# Patient Record
Sex: Male | Born: 1980 | Race: Black or African American | Hispanic: No | Marital: Married | Smoking: Never smoker
Health system: Southern US, Community
[De-identification: ages and names within clinical notes are randomized; demographics above are authoritative.]

## PROBLEM LIST (undated history)

## (undated) DIAGNOSIS — K56609 Unspecified intestinal obstruction, unspecified as to partial versus complete obstruction: Secondary | ICD-10-CM

## (undated) DIAGNOSIS — C099 Malignant neoplasm of tonsil, unspecified: Secondary | ICD-10-CM

## (undated) DIAGNOSIS — D1391 Familial adenomatous polyposis: Secondary | ICD-10-CM

## (undated) DIAGNOSIS — F32A Depression, unspecified: Secondary | ICD-10-CM

## (undated) DIAGNOSIS — F419 Anxiety disorder, unspecified: Secondary | ICD-10-CM

## (undated) DIAGNOSIS — F4310 Post-traumatic stress disorder, unspecified: Secondary | ICD-10-CM

## (undated) DIAGNOSIS — I82409 Acute embolism and thrombosis of unspecified deep veins of unspecified lower extremity: Secondary | ICD-10-CM

## (undated) DIAGNOSIS — J45909 Unspecified asthma, uncomplicated: Secondary | ICD-10-CM

## (undated) HISTORY — DX: Depression, unspecified: F32.A

## (undated) HISTORY — DX: Unspecified intestinal obstruction, unspecified as to partial versus complete obstruction: K56.609

## (undated) HISTORY — DX: Post-traumatic stress disorder, unspecified: F43.10

## (undated) HISTORY — PX: APPENDECTOMY: SHX54

## (undated) HISTORY — DX: Anxiety disorder, unspecified: F41.9

## (undated) HISTORY — DX: Malignant neoplasm of tonsil, unspecified: C09.9

## (undated) HISTORY — DX: Familial adenomatous polyposis: D13.91

---

## 2006-11-29 HISTORY — PX: JOINT REPLACEMENT: SHX530

## 2014-11-29 HISTORY — PX: OTHER SURGICAL HISTORY: SHX169

## 2015-02-20 ENCOUNTER — Encounter (HOSPITAL_COMMUNITY): Payer: Self-pay | Admitting: Neurology

## 2015-02-20 ENCOUNTER — Emergency Department (HOSPITAL_COMMUNITY)
Admission: EM | Admit: 2015-02-20 | Discharge: 2015-02-20 | Disposition: A | Discharge status: Home / Self Care | Payer: BLUE CROSS/BLUE SHIELD | Attending: Emergency Medicine | Admitting: Emergency Medicine

## 2015-02-20 ENCOUNTER — Emergency Department (HOSPITAL_COMMUNITY): Payer: BLUE CROSS/BLUE SHIELD

## 2015-02-20 DIAGNOSIS — R51 Headache: Secondary | ICD-10-CM | POA: Insufficient documentation

## 2015-02-20 DIAGNOSIS — J4521 Mild intermittent asthma with (acute) exacerbation: Secondary | ICD-10-CM | POA: Diagnosis not present

## 2015-02-20 DIAGNOSIS — R1111 Vomiting without nausea: Secondary | ICD-10-CM | POA: Insufficient documentation

## 2015-02-20 DIAGNOSIS — R42 Dizziness and giddiness: Secondary | ICD-10-CM | POA: Insufficient documentation

## 2015-02-20 DIAGNOSIS — R0602 Shortness of breath: Secondary | ICD-10-CM

## 2015-02-20 DIAGNOSIS — J45909 Unspecified asthma, uncomplicated: Secondary | ICD-10-CM | POA: Diagnosis present

## 2015-02-20 HISTORY — DX: Unspecified asthma, uncomplicated: J45.909

## 2015-02-20 MED ORDER — ALBUTEROL SULFATE (2.5 MG/3ML) 0.083% IN NEBU
5.0000 mg | INHALATION_SOLUTION | Freq: Once | RESPIRATORY_TRACT | Status: AC
  Administered 2015-02-20: 5 mg via RESPIRATORY_TRACT
  Filled 2015-02-20: qty 6

## 2015-02-20 MED ORDER — PREDNISONE 20 MG PO TABS
60.0000 mg | ORAL_TABLET | Freq: Once | ORAL | Status: AC
  Administered 2015-02-20: 60 mg via ORAL
  Filled 2015-02-20: qty 3

## 2015-02-20 MED ORDER — IPRATROPIUM BROMIDE 0.02 % IN SOLN
0.5000 mg | Freq: Once | RESPIRATORY_TRACT | Status: AC
  Administered 2015-02-20: 0.5 mg via RESPIRATORY_TRACT
  Filled 2015-02-20: qty 2.5

## 2015-02-20 MED ORDER — BENZONATATE 100 MG PO CAPS: 100.0000 mg | ORAL_CAPSULE | Freq: Three times a day (TID) | ORAL | Status: DC | PRN

## 2015-02-20 MED ORDER — ALBUTEROL SULFATE HFA 108 (90 BASE) MCG/ACT IN AERS: 1.0000 | INHALATION_SPRAY | Freq: Four times a day (QID) | RESPIRATORY_TRACT | Status: DC | PRN

## 2015-02-20 MED ORDER — PREDNISONE 20 MG PO TABS: ORAL_TABLET | ORAL | Status: DC

## 2015-02-20 NOTE — ED Notes (Signed)
Pt ambulated in hallway with no complaints. O2 level went to 97%.

## 2015-02-20 NOTE — ED Notes (Signed)
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 Reports central cp. Expiratory wheezing noted. Pt is a x 4. In NAD

## 2015-02-20 NOTE — ED Provider Notes (Signed)
CSN: [id number]     Arrival date & time 02/20/15  1842 History  This chart was scribed for non-physician practitioner, Domenic Moras working with Lacretia Leigh, MD by Judithann Sauger, ED Scribe. The patient was seen in room TR09C/TR09C and the patient's care was started at 7:16 PM      Chief Complaint  Patient presents with  . Asthma   The history is provided by the patient. No language interpreter was used.   HPI Comments: Harry Williams is a 34 y.o. male with a hx of asthma who presents to the Emergency Department complaining of asthma symptoms onset today. He reports that he was diagnosed with asthma and given an inhaler about a month ago. During that time he presented with fatigue and wheezing. He denies any childhood asthma but adds that he had bronchitis when he was younger. 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 While at work, he had associated coughing, post-tussive vomiting, CP, rhinorrhea, eye soreness, HA, and lightheadedness. He explains during his episode, he was unable to talk or breathe. He reports that he took his inhaler which allowed him to be able to breathe and talk. He denies fever, abdominal pain, or N/V/D . Past Medical History  Diagnosis Date  . Asthma    History reviewed. No pertinent past surgical history. No family history on file. History  Substance Use Topics  . Smoking status: Never Smoker   . Smokeless tobacco: Not on file  . Alcohol Use: Yes    Review of Systems  Constitutional: Negative for chills and fatigue.  HENT: Positive for rhinorrhea.   Eyes: Positive for pain.  Respiratory: Positive for cough and shortness of breath.   Cardiovascular: Positive for chest pain.  Gastrointestinal: Positive for vomiting. Negative for nausea, abdominal pain and diarrhea.  Neurological: Positive for dizziness and headaches.      Allergies  Review of patient's allergies indicates no known allergies.  Home Medications   Prior to Admission medications    Not on File   BP 119/58 mmHg  Pulse 88  Temp(Src) 97.1 F (36.2 C) (Oral)  Resp 15  SpO2 94% Physical Exam  Constitutional: He is oriented to person, place, and time. He appears well-developed and well-nourished. No distress.  African American male in mild respiratory discomfort and currently receiving a nebulizer treatment during interview.  HENT:  Head: Normocephalic and atraumatic.  Right Ear: Tympanic membrane normal.  Left Ear: Tympanic membrane normal.  Mouth/Throat: No oropharyngeal exudate or posterior oropharyngeal edema.  Nose with mild rhinorrhea.  No uvula midline.   Eyes: Conjunctivae and EOM are normal.  Neck: Neck supple. No JVD present. No tracheal deviation present.  Cardiovascular: Normal rate.   Pulmonary/Chest: Effort normal. No respiratory distress.  Expiratory wheezes in bronchi throughout.  Abdominal: There is no tenderness.  Musculoskeletal: Normal range of motion.  Neurological: He is alert and oriented to person, place, and time.  Able to speak in full sentences.   Skin: Skin is warm and dry.  Psychiatric: He has a normal mood and affect. His behavior is normal.  Nursing note and vitals reviewed.   ED Course  Procedures (including critical care time) DIAGNOSTIC STUDIES: Oxygen Saturation is 94% on RA/Shannon, adequate by my interpretation.    COORDINATION OF CARE: 7:22 PM- Pt currently receiving a nebulizer treatment. Pt advised of plan for treatment and pt agrees.   7:59 PM Improvement of lung aeration after receiving breathing treatment, pt felt better.  Able to ambulate while  maintaining 97% on RA.  CXR neg for acute infection.  Will d/c with cough medication, albuterol inhaler, and steroid dose pak.   Labs Review Labs Reviewed - No data to display  Imaging Review Dg Chest 2 View  02/20/2015   CLINICAL DATA:  Increased cough and difficulty breathing  EXAM: CHEST  2 VIEW  COMPARISON:  None.  FINDINGS: The heart size and mediastinal contours  are within normal limits. Both lungs are clear. The visualized skeletal structures are unremarkable.  IMPRESSION: No active cardiopulmonary disease.   Electronically Signed   By: Inez Catalina M.D.   On: 02/20/2015 20:36     EKG Interpretation None      MDM   Final diagnoses:  SOB (shortness of breath)  Asthma exacerbation attacks, mild intermittent   BP 119/58 mmHg  Pulse 88  Temp(Src) 97.1 F (36.2 C) (Oral)  Resp 15  SpO2 94%  I have reviewed nursing notes and vital signs. I personally reviewed the imaging tests through PACS system  I reviewed available ER/hospitalization records thought the EMR  I personally performed the services described in this documentation, which was scribed in my presence. The recorded information has been reviewed and is accurate.    Domenic Moras, PA-C 02/20/15 2041  Lacretia Leigh, MD 02/23/15 [phone]

## 2015-02-20 NOTE — Discharge Instructions (Signed)

## 2015-03-30 HISTORY — PX: COLON SURGERY: SHX602

## 2015-04-04 DIAGNOSIS — D369 Benign neoplasm, unspecified site: Secondary | ICD-10-CM | POA: Insufficient documentation

## 2015-06-04 ENCOUNTER — Ambulatory Visit (INDEPENDENT_AMBULATORY_CARE_PROVIDER_SITE_OTHER): Payer: BLUE CROSS/BLUE SHIELD | Admitting: Physician Assistant

## 2015-06-04 VITALS — BP 112/68 | HR 68 | Temp 98.2°F | Resp 16 | Ht 68.0 in | Wt 160.4 lb

## 2015-06-04 DIAGNOSIS — K047 Periapical abscess without sinus: Secondary | ICD-10-CM

## 2015-06-04 MED ORDER — CHLORHEXIDINE GLUCONATE 0.12 % MT SOLN: 15.0000 mL | Freq: Two times a day (BID) | OROMUCOSAL | Status: DC

## 2015-06-04 MED ORDER — AMOXICILLIN-POT CLAVULANATE 875-125 MG PO TABS: 1.0000 | ORAL_TABLET | Freq: Two times a day (BID) | ORAL | Status: AC

## 2015-06-04 NOTE — Progress Notes (Signed)
[facility] [address] [place] [address] [phone]  Date:  06/04/2015   Name:  Harry Williams   DOB:  09-Mar-1981   MRN:  [id number]  PCP:  No PCP Per Patient    Chief Complaint: Dental Pain   History of Present Illness:  This is a 34 y.o. male who is presenting with dental pain x 2 days. Pain located to lower left jaw. Having pain with chewing. He is sensitive to hot and cold fluids. He looked in his mouth and noticed the gum over the most posterior molar on the lower left side looked swollen. Hasn't tried anything for the pain. Last saw a dentist in 2002. Does not have dental insurance. He denies fever, chills, malaise, headache, dysphagia.  Pt had total colectomy 03/2015 for polyposis.  Review of Systems:  Review of Systems See HPI  Patient Active Problem List   Diagnosis Date Noted  . Adenomatous polyp 04/04/2015    Prior to Admission medications   Medication Sig Start Date End Date Taking? Authorizing Provider  albuterol (PROVENTIL HFA;VENTOLIN HFA) 108 (90 BASE) MCG/ACT inhaler Inhale 1-2 puffs into the lungs every 6 (six) hours as needed for wheezing or shortness of breath. 02/20/15  Yes Fayrene Helper, PA-C  dicyclomine (BENTYL) 10 MG capsule Take 10 mg by mouth 4 (four) times daily -  before meals and at bedtime.   Yes Historical Provider, MD    No Known Allergies  Past Surgical History  Procedure Laterality Date  . Appendectomy    . Colon surgery      History  Substance Use Topics  . Smoking status: Never Smoker   . Smokeless tobacco: Not on file  . Alcohol Use: Yes    Family History  Problem Relation Age of Onset  . Diabetes Father   . Hypertension Brother   . Heart disease Paternal Grandmother   . Cancer Paternal Grandfather     Medication list has been reviewed and updated.  Physical Examination:  Physical Exam  Constitutional: He is oriented to person, place, and time. He appears well-developed and well-nourished. No  distress.  HENT:  Head: Normocephalic and atraumatic.  Right Ear: Hearing normal.  Left Ear: Hearing normal.  Nose: Nose normal.  Mouth/Throat: Uvula is midline, oropharynx is clear and moist and mucous membranes are normal.    No facial swelling  Eyes: Conjunctivae and lids are normal. Right eye exhibits no discharge. Left eye exhibits no discharge. No scleral icterus.  Cardiovascular: Normal rate, regular rhythm, normal heart sounds and normal pulses.   No murmur heard. Pulmonary/Chest: Effort normal and breath sounds normal. No respiratory distress. He has no wheezes. He has no rhonchi. He has no rales.  Musculoskeletal: Normal range of motion.  Neurological: He is alert and oriented to person, place, and time.  Skin: Skin is warm, dry and intact. No lesion and no rash noted.  Psychiatric: He has a normal mood and affect. His speech is normal and behavior is normal. Thought content normal.   BP 112/68 mmHg  Pulse 68  Temp(Src) 98.2 F (36.8 C) (Oral)  Resp 16  Ht 5\' 8"  (1.727 m)  Wt 160 lb 6.4 oz (72.757 kg)  BMI 24.39 kg/m2  SpO2 98%  Assessment and Plan:  1. Dental abscess Wound culture obtained from purulent material. Placed on abx rather than opening abscess as very little fluctuance present. He will gargle peridex twice a day. He plans to make appointment with dentist asap. Follow  up with dentist if symptoms persist after abx. - chlorhexidine (PERIDEX) 0.12 % solution; Use as directed 15 mLs in the mouth or throat 2 (two) times daily.  Dispense: 120 mL; Refill: 0 - amoxicillin-clavulanate (AUGMENTIN) 875-125 MG per tablet; Take 1 tablet by mouth 2 (two) times daily.  Dispense: 20 tablet; Refill: 0 - Wound culture   [place] [employer]. Dyke Brackett, MHS [facility]  06/05/2015

## 2015-06-04 NOTE — Patient Instructions (Addendum)
Take antibiotic twice a day for 10 days. Gargle mouthwash two times daily. Make appointment with a dentist soon for a cleaning and evaluation. Return if your symptoms are not improving after you finish you antibiotics.

## 2015-06-05 ENCOUNTER — Encounter: Payer: Self-pay | Admitting: Physician Assistant

## 2015-06-07 LAB — WOUND CULTURE: Gram Stain: NONE SEEN

## 2016-02-17 ENCOUNTER — Ambulatory Visit (INDEPENDENT_AMBULATORY_CARE_PROVIDER_SITE_OTHER): Payer: Managed Care, Other (non HMO) | Admitting: Family Medicine

## 2016-02-17 VITALS — BP 120/80 | HR 62 | Temp 98.4°F | Resp 20 | Ht 68.0 in | Wt 170.4 lb

## 2016-02-17 DIAGNOSIS — M62830 Muscle spasm of back: Secondary | ICD-10-CM

## 2016-02-17 MED ORDER — CYCLOBENZAPRINE HCL 10 MG PO TABS: 10.0000 mg | ORAL_TABLET | Freq: Every evening | ORAL | Status: DC | PRN

## 2016-02-17 MED ORDER — NAPROXEN 500 MG PO TABS: 500.0000 mg | ORAL_TABLET | Freq: Two times a day (BID) | ORAL | Status: DC | PRN

## 2016-02-17 NOTE — Assessment & Plan Note (Signed)
No red flags on exam today.   - Ice x 24-48 hours and then heat.  Gentle stretches - Flexeril at night PRN - Naproxen 440 BID x 10 days - F/U PRN.

## 2016-02-17 NOTE — Patient Instructions (Signed)
     IF you received an x-ray today, you will receive an invoice from [facility]. Please contact [facility] at [phone] with questions or concerns regarding your invoice.   IF you received labwork today, you will receive an invoice from [facility]. Please contact [facility] at [phone] with questions or concerns regarding your invoice.   Our billing staff will not be able to assist you with questions regarding bills from these companies.  You will be contacted with the lab results as soon as they are available. The fastest way to get your results is to activate your My Chart account. Instructions are located on the last page of this paperwork. If you have not heard from us regarding the results in 2 weeks, please contact this office.      

## 2016-02-17 NOTE — Progress Notes (Signed)
[place] - 35 y.o. male MRN [id number]  Date of birth: Sep 13, 1981 Avanish Wiemken is a 35 y.o. male who presents today for mid back pain.  Mid back pain - Ongoing now for about 12 hours. Started when climbing a ladder and felt spasm and pain in back.  No radicular S/Sx at this time.  Pt denies any current bowel/bladder problems, fever, chills, unintentional weight loss, night time awakenings secondary to pain, weakness in one or both legs.  Denies previous spasms or center back pain.  No injury at this time.    PMHx - Updated and reviewed.  Contributory factors include: Negative  PSHx - Updated and reviewed.  Contributory factors include:  Appendectomy  FHx - Updated and reviewed.  Contributory factors include:  Negative  Social Hx - Updated and reviewed. Contributory factors include: non smoker  Medications - updated/reviewed    ROS Per HPI.  12 point negative other than per HPI.   Exam:  Filed Vitals:   02/17/16 1748  BP: 120/80  Pulse: 62  Temp: 98.4 F (36.9 C)  Resp: 20   Gen: NAD, AAO 3 Cardio- RRR Pulm - Normal respiratory effort/rate Skin: No rashes or erythema Extremities: No edema  Vascular: pulses +2 bilateral upper and lower extremity Psych: Normal affect  Midback: + hypertonicity and TTP along the spinalis/longissimus muscles.  MS 5/5 B/L UE and LE.  Neurovascular intact B/L.

## 2016-03-24 ENCOUNTER — Ambulatory Visit (INDEPENDENT_AMBULATORY_CARE_PROVIDER_SITE_OTHER): Payer: Managed Care, Other (non HMO) | Admitting: Physician Assistant

## 2016-03-24 DIAGNOSIS — M5410 Radiculopathy, site unspecified: Secondary | ICD-10-CM

## 2016-03-24 MED ORDER — ALBUTEROL SULFATE HFA 108 (90 BASE) MCG/ACT IN AERS: 1.0000 | INHALATION_SPRAY | Freq: Four times a day (QID) | RESPIRATORY_TRACT | Status: DC | PRN

## 2016-03-24 MED ORDER — PREDNISONE 10 MG PO TABS: ORAL_TABLET | ORAL | Status: AC

## 2016-03-24 NOTE — Patient Instructions (Addendum)
Heat and muscle relaxers      IF you received an x-ray today, you will receive an invoice from [facility]. Please contact [facility] at [phone] with questions or concerns regarding your invoice.   IF you received labwork today, you will receive an invoice from [facility]. Please contact [facility] at [phone] with questions or concerns regarding your invoice.   Our billing staff will not be able to assist you with questions regarding bills from these companies.  You will be contacted with the lab results as soon as they are available. The fastest way to get your results is to activate your My Chart account. Instructions are located on the last page of this paperwork. If you have not heard from [name] regarding the results in 2 weeks, please contact this office.

## 2016-03-24 NOTE — Progress Notes (Signed)
Harry Williams  MRN: [id number] DOB: 03-26-1981  Subjective:  Pt presents to clinic with 3 week h/o back pain - climbing up a ladder without tools - he was in the process of stepping and he felt the pain.  The pain is across the lower back but within the last few days it is more on the left side.  Certain movements cause pain down his left leg, esp with legs in flexion and then flexing the back.  He has been using naproxen and flexeril as Rx for the last 3 weeks ago - he feels relaxed but the pain is not much better.  He is having no paresthesias or numbness or weakness in his left leg.  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  He also would like a refill of his albuterol while he is here.  Patient Active Problem List   Diagnosis Date Noted  . Paraspinal muscle spasm 02/17/2016  . Adenomatous polyp 04/04/2015    Current Outpatient Prescriptions on File Prior to Visit  Medication Sig Dispense Refill  . cyclobenzaprine (FLEXERIL) 10 MG tablet Take 1 tablet (10 mg total) by mouth at bedtime as needed for muscle spasms. 30 tablet 0  . naproxen (NAPROSYN) 500 MG tablet Take 1 tablet (500 mg total) by mouth 2 (two) times daily as needed. 60 tablet 2   No current facility-administered medications on file prior to visit.    No Known Allergies  Review of Systems  Constitutional: Negative for fever and chills.  Musculoskeletal: Positive for back pain. Negative for gait problem.   Objective:  BP 118/80 mmHg  Pulse 61  Temp(Src) 97.9 F (36.6 C) (Oral)  Resp 16  Ht 5\' 8"  (1.727 m)  Wt 174 lb (78.926 kg)  BMI 26.46 kg/m2  SpO2 98%  Physical Exam  Constitutional: He is oriented to person, place, and time and well-developed, well-nourished, and in no distress.  HENT:  Head: Normocephalic and atraumatic.  Right Ear: External ear normal.  Left Ear: External ear normal.  Eyes: Conjunctivae are normal.    Neck: Normal range of motion.  Pulmonary/Chest: Effort normal.  Musculoskeletal:       Lumbar back: He exhibits tenderness and pain. He exhibits no deformity.       Back:  Neurological: He is alert and oriented to person, place, and time. He has normal sensation and normal strength. He displays abnormal reflex. He displays no weakness. No sensory deficit. He has a normal Straight Leg Raise Test. Gait normal. Gait normal.  Reflex Scores:      Patellar reflexes are 2+ on the right side and 1+ on the left side.      Achilles reflexes are 2+ on the right side and 2+ on the left side. Skin: Skin is warm and dry.  Psychiatric: Mood, memory, affect and judgment normal.    Assessment and Plan :  Back pain with left-sided radiculopathy - Plan: predniSONE (DELTASONE) 10 MG tablet   Pt has been doing conservative treatment with muscle relaxers and NSAIDs for the last 3 weeks and his pain has not improved.  With his past history of back injury needing injections we will do a referral to ortho.  Because of his weak knee reflex we will start a course of oral steroids while waiting on his appt.  He will not use the Naproxen while on the steroids. He will be careful with  lifting and bending.  Benny Lennert PA-C  [facility] 03/24/2016 10:09 AM

## 2016-04-13 ENCOUNTER — Telehealth: Payer: Self-pay

## 2016-04-13 DIAGNOSIS — M5410 Radiculopathy, site unspecified: Secondary | ICD-10-CM

## 2016-04-13 NOTE — Telephone Encounter (Signed)
Patient called about an orthopedic referral.  Patient was seen on 03/24/16.  There is not a referral in the system, but based on the notes, it appears that one was meant to be placed.  Please advise.   Patient CB#: [phone]

## 2016-04-15 NOTE — Telephone Encounter (Signed)
Referral sent 

## 2016-06-04 ENCOUNTER — Encounter: Payer: Self-pay | Admitting: Physician Assistant

## 2016-06-04 DIAGNOSIS — G8929 Other chronic pain: Secondary | ICD-10-CM | POA: Insufficient documentation

## 2016-06-04 DIAGNOSIS — M545 Low back pain: Secondary | ICD-10-CM

## 2016-09-27 ENCOUNTER — Emergency Department (HOSPITAL_COMMUNITY): Payer: Managed Care, Other (non HMO)

## 2016-09-27 ENCOUNTER — Encounter (HOSPITAL_COMMUNITY): Payer: Self-pay | Admitting: Emergency Medicine

## 2016-09-27 ENCOUNTER — Emergency Department (HOSPITAL_COMMUNITY)
Admission: EM | Admit: 2016-09-27 | Discharge: 2016-09-27 | Disposition: A | Discharge status: Home / Self Care | Payer: Managed Care, Other (non HMO) | Attending: Emergency Medicine | Admitting: Emergency Medicine

## 2016-09-27 DIAGNOSIS — J45909 Unspecified asthma, uncomplicated: Secondary | ICD-10-CM | POA: Insufficient documentation

## 2016-09-27 DIAGNOSIS — M25521 Pain in right elbow: Secondary | ICD-10-CM

## 2016-09-27 DIAGNOSIS — R079 Chest pain, unspecified: Secondary | ICD-10-CM | POA: Insufficient documentation

## 2016-09-27 LAB — CBC
HEMATOCRIT: 43.9 % (ref 39.0–52.0)
Hemoglobin: 15.1 g/dL (ref 13.0–17.0)
MCH: 31.1 pg (ref 26.0–34.0)
MCHC: 34.4 g/dL (ref 30.0–36.0)
MCV: 90.5 fL (ref 78.0–100.0)
Platelets: 221 10*3/uL (ref 150–400)
RBC: 4.85 MIL/uL (ref 4.22–5.81)
RDW: 13 % (ref 11.5–15.5)
WBC: 4.8 10*3/uL (ref 4.0–10.5)

## 2016-09-27 LAB — BASIC METABOLIC PANEL
Anion gap: 5 (ref 5–15)
BUN: 12 mg/dL (ref 6–20)
CALCIUM: 9.3 mg/dL (ref 8.9–10.3)
CO2: 29 mmol/L (ref 22–32)
CREATININE: 0.96 mg/dL (ref 0.61–1.24)
Chloride: 106 mmol/L (ref 101–111)
GFR calc Af Amer: >60 mL/min (ref >60)
GFR calc non Af Amer: >60 mL/min (ref >60)
GLUCOSE: 92 mg/dL (ref 65–99)
POTASSIUM: 4.4 mmol/L (ref 3.5–5.1)
Sodium: 140 mmol/L (ref 135–145)

## 2016-09-27 LAB — I-STAT TROPONIN, ED: Troponin i, poc: 0 ng/mL (ref 0.00–0.08)

## 2016-09-27 NOTE — ED Notes (Signed)
Pt transported to Xray. 

## 2016-09-27 NOTE — Discharge Instructions (Signed)
Follow up with hand surgery.  They may want you to see neurology

## 2016-09-27 NOTE — ED Triage Notes (Signed)
Pt reports constant sharp right chest pain radiating to right arm. Denies N/V/D and SOB. Pt also reports history of right arm surgery with screw placement.

## 2016-09-27 NOTE — ED Notes (Signed)
Patient asking what he could do for pain control until he is seen by other specialties.   Waiting to hear from Dr Coby Nine.

## 2016-09-27 NOTE — ED Notes (Signed)
Spoke with Dr Chuong Nine after he got out of procedure with another patient, about patient's request for pain control.  Was instructed for patient to use Tylenol and Ibuprofen.  Made patient aware.  Encouraged patient to call for referral offices first thing in the morning to get soonest available appt.
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
## 2016-12-19 ENCOUNTER — Encounter (HOSPITAL_COMMUNITY): Payer: Self-pay | Admitting: Emergency Medicine

## 2016-12-19 ENCOUNTER — Emergency Department (HOSPITAL_COMMUNITY)
Admission: EM | Admit: 2016-12-19 | Discharge: 2016-12-19 | Disposition: A | Discharge status: Home / Self Care | Payer: Managed Care, Other (non HMO) | Attending: Emergency Medicine | Admitting: Emergency Medicine

## 2016-12-19 ENCOUNTER — Emergency Department (HOSPITAL_COMMUNITY): Payer: Managed Care, Other (non HMO)

## 2016-12-19 DIAGNOSIS — Y939 Activity, unspecified: Secondary | ICD-10-CM | POA: Insufficient documentation

## 2016-12-19 DIAGNOSIS — S0990XA Unspecified injury of head, initial encounter: Secondary | ICD-10-CM | POA: Diagnosis not present

## 2016-12-19 DIAGNOSIS — Y999 Unspecified external cause status: Secondary | ICD-10-CM | POA: Insufficient documentation

## 2016-12-19 DIAGNOSIS — S0992XA Unspecified injury of nose, initial encounter: Secondary | ICD-10-CM | POA: Diagnosis present

## 2016-12-19 DIAGNOSIS — Y929 Unspecified place or not applicable: Secondary | ICD-10-CM | POA: Insufficient documentation

## 2016-12-19 DIAGNOSIS — S0101XA Laceration without foreign body of scalp, initial encounter: Secondary | ICD-10-CM | POA: Diagnosis not present

## 2016-12-19 DIAGNOSIS — S022XXA Fracture of nasal bones, initial encounter for closed fracture: Secondary | ICD-10-CM | POA: Diagnosis not present

## 2016-12-19 DIAGNOSIS — S022XXB Fracture of nasal bones, initial encounter for open fracture: Secondary | ICD-10-CM

## 2016-12-19 LAB — PREPARE FRESH FROZEN PLASMA
BLOOD PRODUCT EXPIRATION DATE: 201801232359
Blood Product Expiration Date: 201801232359
ISSUE DATE / TIME: 201801210506
ISSUE DATE / TIME: 201801210506
UNIT TYPE AND RH: 6200
Unit Type and Rh: 6200

## 2016-12-19 LAB — CBC WITH DIFFERENTIAL/PLATELET
Basophils Absolute: 0 10*3/uL (ref 0.0–0.1)
Basophils Relative: 1 %
EOS ABS: 0.1 10*3/uL (ref 0.0–0.7)
Eosinophils Relative: 2 %
HEMATOCRIT: 43.2 % (ref 39.0–52.0)
HEMOGLOBIN: 15.1 g/dL (ref 13.0–17.0)
LYMPHS ABS: 2.3 10*3/uL (ref 0.7–4.0)
LYMPHS PCT: 48 %
MCH: 30.6 pg (ref 26.0–34.0)
MCHC: 35 g/dL (ref 30.0–36.0)
MCV: 87.6 fL (ref 78.0–100.0)
Monocytes Absolute: 0.4 10*3/uL (ref 0.1–1.0)
Monocytes Relative: 8 %
NEUTROS ABS: 1.9 10*3/uL (ref 1.7–7.7)
NEUTROS PCT: 41 %
Platelets: 216 10*3/uL (ref 150–400)
RBC: 4.93 MIL/uL (ref 4.22–5.81)
RDW: 12.7 % (ref 11.5–15.5)
WBC: 4.7 10*3/uL (ref 4.0–10.5)

## 2016-12-19 LAB — I-STAT CHEM 8, ED
BUN: 7 mg/dL (ref 6–20)
CREATININE: 1.2 mg/dL (ref 0.61–1.24)
Calcium, Ion: 1.2 mmol/L (ref 1.15–1.40)
Chloride: 104 mmol/L (ref 101–111)
GLUCOSE: 98 mg/dL (ref 65–99)
HEMATOCRIT: 45 % (ref 39.0–52.0)
HEMOGLOBIN: 15.3 g/dL (ref 13.0–17.0)
Potassium: 3.2 mmol/L — ABNORMAL LOW (ref 3.5–5.1)
Sodium: 141 mmol/L (ref 135–145)
TCO2: 25 mmol/L (ref 0–100)

## 2016-12-19 LAB — COMPREHENSIVE METABOLIC PANEL
ALK PHOS: 55 U/L (ref 38–126)
ALT: 20 U/L (ref 17–63)
AST: 25 U/L (ref 15–41)
Albumin: 4.2 g/dL (ref 3.5–5.0)
Anion gap: 9 (ref 5–15)
BILIRUBIN TOTAL: 0.8 mg/dL (ref 0.3–1.2)
BUN: 7 mg/dL (ref 6–20)
CALCIUM: 9 mg/dL (ref 8.9–10.3)
CO2: 24 mmol/L (ref 22–32)
CREATININE: 0.99 mg/dL (ref 0.61–1.24)
Chloride: 106 mmol/L (ref 101–111)
GFR calc Af Amer: >60 mL/min (ref >60)
GFR calc non Af Amer: >60 mL/min (ref >60)
GLUCOSE: 100 mg/dL — AB (ref 65–99)
Potassium: 3.3 mmol/L — ABNORMAL LOW (ref 3.5–5.1)
SODIUM: 139 mmol/L (ref 135–145)
TOTAL PROTEIN: 6.9 g/dL (ref 6.5–8.1)

## 2016-12-19 LAB — ETHANOL: Alcohol, Ethyl (B): 102 mg/dL — ABNORMAL HIGH (ref <5)

## 2016-12-19 MED ORDER — TETANUS-DIPHTH-ACELL PERTUSSIS 5-2.5-18.5 LF-MCG/0.5 IM SUSP
0.5000 mL | Freq: Once | INTRAMUSCULAR | Status: AC
  Administered 2016-12-19: 0.5 mL via INTRAMUSCULAR
  Filled 2016-12-19: qty 0.5

## 2016-12-19 MED ORDER — AMOXICILLIN-POT CLAVULANATE 875-125 MG PO TABS: 1.0000 | ORAL_TABLET | Freq: Two times a day (BID) | ORAL | 0 refills | Status: DC

## 2016-12-19 MED ORDER — LIDOCAINE-EPINEPHRINE (PF) 2 %-1:200000 IJ SOLN
20.0000 mL | Freq: Once | INTRAMUSCULAR | Status: AC
  Administered 2016-12-19: 20 mL
  Filled 2016-12-19 (×2): qty 20
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
## 2016-12-19 NOTE — ED Notes (Signed)
Pt able to ambulate without discomfort. Pt also able to drink fluids.

## 2016-12-19 NOTE — Discharge Instructions (Signed)
Follow up for suture removal in 1 week. Follow up with the ENT doctor. Return to the ED if you develop new or worsening symptoms.

## 2016-12-20 ENCOUNTER — Encounter (HOSPITAL_COMMUNITY): Payer: Self-pay | Admitting: Emergency Medicine

## 2016-12-24 DIAGNOSIS — S022XXD Fracture of nasal bones, subsequent encounter for fracture with routine healing: Secondary | ICD-10-CM

## 2016-12-24 HISTORY — DX: Fracture of nasal bones, subsequent encounter for fracture with routine healing: S02.2XXD

## 2019-02-14 ENCOUNTER — Encounter (HOSPITAL_COMMUNITY): Payer: Self-pay | Admitting: Emergency Medicine

## 2019-02-14 ENCOUNTER — Emergency Department (HOSPITAL_COMMUNITY)
Admission: EM | Admit: 2019-02-14 | Discharge: 2019-02-14 | Disposition: A | Discharge status: Home / Self Care | Payer: Self-pay | Attending: Emergency Medicine | Admitting: Emergency Medicine

## 2019-02-14 ENCOUNTER — Emergency Department (HOSPITAL_COMMUNITY): Payer: Self-pay

## 2019-02-14 ENCOUNTER — Other Ambulatory Visit: Payer: Self-pay

## 2019-02-14 DIAGNOSIS — R509 Fever, unspecified: Secondary | ICD-10-CM

## 2019-02-14 DIAGNOSIS — B349 Viral infection, unspecified: Secondary | ICD-10-CM | POA: Insufficient documentation

## 2019-02-14 DIAGNOSIS — J45909 Unspecified asthma, uncomplicated: Secondary | ICD-10-CM | POA: Insufficient documentation

## 2019-02-14 LAB — RESPIRATORY PANEL BY PCR
Adenovirus: NOT DETECTED
Bordetella pertussis: NOT DETECTED
Chlamydophila pneumoniae: NOT DETECTED
Coronavirus 229E: NOT DETECTED
Coronavirus HKU1: NOT DETECTED
Coronavirus NL63: NOT DETECTED
Coronavirus OC43: NOT DETECTED
Influenza A H1 2009: DETECTED — AB
Influenza B: NOT DETECTED
Metapneumovirus: NOT DETECTED
Mycoplasma pneumoniae: NOT DETECTED
Parainfluenza Virus 1: NOT DETECTED
Parainfluenza Virus 2: NOT DETECTED
Parainfluenza Virus 3: NOT DETECTED
Parainfluenza Virus 4: NOT DETECTED
Respiratory Syncytial Virus: NOT DETECTED
Rhinovirus / Enterovirus: NOT DETECTED

## 2019-02-14 MED ORDER — ACETAMINOPHEN 325 MG PO TABS: 650.0000 mg | ORAL_TABLET | Freq: Once | ORAL | Status: DC

## 2019-02-14 MED ORDER — OSELTAMIVIR PHOSPHATE 75 MG PO CAPS: 75.0000 mg | ORAL_CAPSULE | Freq: Two times a day (BID) | ORAL | 0 refills | Status: DC

## 2019-02-14 MED ORDER — ALBUTEROL SULFATE HFA 108 (90 BASE) MCG/ACT IN AERS: 1.0000 | INHALATION_SPRAY | Freq: Four times a day (QID) | RESPIRATORY_TRACT | 0 refills | Status: DC | PRN

## 2019-02-14 MED ORDER — IBUPROFEN 800 MG PO TABS
800.0000 mg | ORAL_TABLET | Freq: Once | ORAL | Status: AC
  Administered 2019-02-14: 800 mg via ORAL
  Filled 2019-02-14: qty 1

## 2019-02-14 NOTE — ED Triage Notes (Addendum)
Patient reports body aches and fever since yesterday. Recently traveled to [place] this weekend via plane. But reports minimal coughing, had tylenol around 6am this morning. Also c/o headache.

## 2019-02-14 NOTE — ED Provider Notes (Signed)
[facility] EMERGENCY DEPARTMENT Provider Note   CSN: [id number] Arrival date & time: 02/14/19  4403   History   Chief Complaint Chief Complaint  Patient presents with  . Fever  . Generalized Body Aches    HPI Harry Williams is a 38 y.o. male.     HPI   38 year old male with a past medical history of asthma presents today with complaints of fever body aches.  Patient notes that he flew from [place] [employer] to [place] on Friday 13 March.  He notes he flew back on Sunday, 22 March.  He notes on Sunday he developed body aches fever and nonproductive cough.  Patient denies any shortness of breath.  He denies any history of severe asthma exacerbations requiring hospitalization, he notes that he does not have an inhaler at home as he does not regularly need one.  He reports that he did not receive the influenza vaccine, he denies any close sick contacts, denies any travel other than that noted above.  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    Past Medical History:  Diagnosis Date  . Asthma     Patient Active Problem List   Diagnosis Date Noted  . Chronic low back pain 06/04/2016  . Paraspinal muscle spasm 02/17/2016  . Adenomatous polyp 04/04/2015    Past Surgical History:  Procedure Laterality Date  . APPENDECTOMY    . COLON SURGERY  03/2015  . intestines removal    . JOINT REPLACEMENT          Home Medications    Prior to Admission medications   Medication Sig Start Date End Date Taking? Authorizing Provider  albuterol (PROVENTIL HFA;VENTOLIN HFA) 108 (90 Base) MCG/ACT inhaler Inhale 1-2 puffs into the lungs every 6 (six) hours as needed for wheezing or shortness of breath. 02/14/19   Aizah Gehlhausen, Dellis Filbert, PA-C  amoxicillin-clavulanate (AUGMENTIN) 875-125 MG tablet Take 1 tablet by mouth every 12 (twelve) hours. 12/19/16   Rancour, Annie Main, MD  cyclobenzaprine (FLEXERIL) 10 MG tablet Take 1 tablet (10 mg total) by mouth at bedtime as needed for  muscle spasms. 02/17/16   Hess, Tamela Oddi, DO  naproxen (NAPROSYN) 500 MG tablet Take 1 tablet (500 mg total) by mouth 2 (two) times daily as needed. 02/17/16   Nolon Rod, DO    Family History Family History  Problem Relation Age of Onset  . Diabetes Father   . Hypertension Brother   . Heart disease Paternal Grandmother   . Cancer Paternal Grandfather     Social History Social History   Tobacco Use  . Smoking status: Never Smoker  . Smokeless tobacco: Never Used  Substance Use Topics  . Alcohol use: Yes  . Drug use: No     Allergies   Patient has no known allergies.   Review of Systems Review of Systems  All other systems reviewed and are negative.    Physical Exam Updated Vital Signs BP 112/78   Pulse 100   Temp 100.1 F (37.8 C) (Oral)   Resp 15   SpO2 94%   Physical Exam Vitals signs and nursing note reviewed.  Constitutional:      Appearance: He is well-developed.  HENT:     Head: Normocephalic and atraumatic.     Comments: Oropharynx clear no swelling edema or erythema Eyes:     General: No scleral icterus.       Right eye: No discharge.        Left eye: No  discharge.     Conjunctiva/sclera: Conjunctivae normal.     Pupils: Pupils are equal, round, and reactive to light.  Neck:     Musculoskeletal: Normal range of motion.     Vascular: No JVD.     Trachea: No tracheal deviation.  Pulmonary:     Effort: Pulmonary effort is normal. No respiratory distress.     Breath sounds: Normal breath sounds. No stridor. No wheezing, rhonchi or rales.  Neurological:     Mental Status: He is alert and oriented to person, place, and time.     Coordination: Coordination normal.  Psychiatric:        Behavior: Behavior normal.        Thought Content: Thought content normal.        Judgment: Judgment normal.      ED Treatments / Results  Labs (all labs ordered are listed, but only abnormal results are displayed) Labs Reviewed  RESPIRATORY PANEL BY PCR     EKG None  Radiology Dg Chest [facility] 1 View  Result Date: 02/14/2019 CLINICAL DATA:  Fever EXAM: PORTABLE CHEST 1 VIEW COMPARISON:  None. FINDINGS: Lungs are clear. Heart size and pulmonary vascularity are normal. No adenopathy. No bone lesions. IMPRESSION: No edema or consolidation. Electronically Signed   By: Lowella Grip III M.D.   On: 02/14/2019 08:54    Procedures Procedures (including critical care time)  Medications Ordered in ED Medications  ibuprofen (ADVIL,MOTRIN) tablet 800 mg (800 mg Oral Given 02/14/19 0940)     Initial Impression / Assessment and Plan / ED Course  I have reviewed the triage vital signs and the nursing notes.  Pertinent labs & imaging results that were available during my care of the patient were reviewed by me and considered in my medical decision making (see chart for details).        Labs: Viral respiratory panel  Imaging: DG chest 2 view  Consults:  Therapeutics: Ibuprofen   Discharge Meds:   Assessment/Plan: 38 year old male presents today with viral type illness.  Patient is well-appearing in no acute distress.  He had a significant fever of 103 here, this was trending down to 100.1 with Tylenol prior to arrival.  He does have a significant past medical history of asthma but denies any significant exacerbations, denies any shortness of breath presently and has clear lung sounds.  She does have recent travel but has not been in area with high probability of coronavirus.  Given his symptoms here my differential includes viral upper respiratory including influenza, less likely coronavirus.  Discussed case with Dr. Vanita Panda, we are in agreement that patient will have respiratory panel here, he is in symptoms are improving with no significant respiratory distress he is stable for outpatient management.  I will call him back with his viral respiratory panel if he is positive for the flu we will continue standard precautions for the flu, if he is  negative for the flu I will recommend a 14-day self quarantine at that time.  I discussed this in depth with both the patient and his wife they both verbalized understanding and agreement to today's plan they were given strict return precautions.  No further questions or concerns at the time of discharge.   Final Clinical Impressions(s) / ED Diagnoses   Final diagnoses:  Viral illness    ED Discharge Orders         Ordered    albuterol (PROVENTIL HFA;VENTOLIN HFA) 108 (90 Base) MCG/ACT inhaler  Every 6 hours  PRN     02/14/19 0920           Okey Regal, PA-C 02/14/19 3081    Carmin Muskrat, MD 02/14/19 [phone]

## 2019-02-14 NOTE — Discharge Instructions (Addendum)
Please read attached information. If you experience any new or worsening signs or symptoms please return to the emergency room for evaluation. Please follow-up with your primary care provider or specialist as discussed. Please use medication prescribed only as directed and discontinue taking if you have any concerning signs or symptoms.   °

## 2019-02-14 NOTE — ED Notes (Signed)
ED Provider at bedside. 

## 2019-02-14 NOTE — ED Notes (Signed)
Patient verbalized understanding of dc instructions, vss, ambulatory with nad.   

## 2020-11-10 ENCOUNTER — Encounter: Payer: Self-pay | Admitting: Emergency Medicine

## 2020-11-10 ENCOUNTER — Ambulatory Visit
Admission: EM | Admit: 2020-11-10 | Discharge: 2020-11-10 | Disposition: A | Discharge status: Home / Self Care | Payer: Self-pay | Attending: Emergency Medicine | Admitting: Emergency Medicine

## 2020-11-10 ENCOUNTER — Other Ambulatory Visit: Payer: Self-pay

## 2020-11-10 DIAGNOSIS — M545 Low back pain, unspecified: Secondary | ICD-10-CM

## 2020-11-10 DIAGNOSIS — M62830 Muscle spasm of back: Secondary | ICD-10-CM

## 2020-11-10 MED ORDER — NAPROXEN 500 MG PO TABS: 500.0000 mg | ORAL_TABLET | Freq: Two times a day (BID) | ORAL | 2 refills | Status: DC | PRN

## 2020-11-10 MED ORDER — CYCLOBENZAPRINE HCL 10 MG PO TABS: 10.0000 mg | ORAL_TABLET | Freq: Every evening | ORAL | 0 refills | Status: DC | PRN
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
## 2020-11-10 NOTE — Discharge Instructions (Addendum)
Heat therapy (hot compress, warm wash rag, hot showers, etc.) can help relax muscles and soothe muscle aches. Cold therapy (ice packs) can be used to help swelling both after injury and after prolonged use of areas of chronic pain/aches.  Pain medication:  500 mg Naprosyn/Aleve (naproxen) every 12 hours with food:  AVOID other NSAIDs while taking this (may have Tylenol).  May take muscle relaxer as needed for severe pain / spasm.  (This medication may cause you to become tired so it is important you do not drink alcohol or operate heavy machinery while on this medication.  Recommend your first dose to be taken before bedtime to monitor for side effects safely)  Important to follow up with specialist(s) below for further evaluation/management if your symptoms persist or worsen. 
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
## 2021-07-09 ENCOUNTER — Emergency Department (HOSPITAL_COMMUNITY): Payer: BC Managed Care – PPO

## 2021-07-09 ENCOUNTER — Emergency Department (HOSPITAL_COMMUNITY)
Admission: EM | Admit: 2021-07-09 | Discharge: 2021-07-09 | Disposition: A | Discharge status: Home / Self Care | Payer: BC Managed Care – PPO | Attending: Emergency Medicine | Admitting: Emergency Medicine

## 2021-07-09 ENCOUNTER — Other Ambulatory Visit: Payer: Self-pay

## 2021-07-09 ENCOUNTER — Encounter (HOSPITAL_COMMUNITY): Payer: Self-pay

## 2021-07-09 DIAGNOSIS — R935 Abnormal findings on diagnostic imaging of other abdominal regions, including retroperitoneum: Secondary | ICD-10-CM | POA: Insufficient documentation

## 2021-07-09 DIAGNOSIS — M62830 Muscle spasm of back: Secondary | ICD-10-CM | POA: Diagnosis not present

## 2021-07-09 DIAGNOSIS — Z96698 Presence of other orthopedic joint implants: Secondary | ICD-10-CM | POA: Diagnosis not present

## 2021-07-09 DIAGNOSIS — G8929 Other chronic pain: Secondary | ICD-10-CM | POA: Insufficient documentation

## 2021-07-09 DIAGNOSIS — M545 Low back pain, unspecified: Secondary | ICD-10-CM | POA: Diagnosis present

## 2021-07-09 DIAGNOSIS — J45909 Unspecified asthma, uncomplicated: Secondary | ICD-10-CM | POA: Diagnosis not present

## 2021-07-09 LAB — COMPREHENSIVE METABOLIC PANEL
ALT: 220 U/L — ABNORMAL HIGH (ref 0–44)
AST: 75 U/L — ABNORMAL HIGH (ref 15–41)
Albumin: 4.2 g/dL (ref 3.5–5.0)
Alkaline Phosphatase: 124 U/L (ref 38–126)
Anion gap: 6 (ref 5–15)
BUN: 8 mg/dL (ref 6–20)
CO2: 29 mmol/L (ref 22–32)
Calcium: 9.3 mg/dL (ref 8.9–10.3)
Chloride: 103 mmol/L (ref 98–111)
Creatinine, Ser: 1.13 mg/dL (ref 0.61–1.24)
GFR, Estimated: >60 mL/min (ref >60)
Glucose, Bld: 87 mg/dL (ref 70–99)
Potassium: 3.8 mmol/L (ref 3.5–5.1)
Sodium: 138 mmol/L (ref 135–145)
Total Bilirubin: 1.3 mg/dL — ABNORMAL HIGH (ref 0.3–1.2)
Total Protein: 7.5 g/dL (ref 6.5–8.1)

## 2021-07-09 LAB — CBC WITH DIFFERENTIAL/PLATELET
Abs Immature Granulocytes: 0.01 10*3/uL (ref 0.00–0.07)
Basophils Absolute: 0 10*3/uL (ref 0.0–0.1)
Basophils Relative: 1 %
Eosinophils Absolute: 0.1 10*3/uL (ref 0.0–0.5)
Eosinophils Relative: 2 %
HCT: 46.2 % (ref 39.0–52.0)
Hemoglobin: 15.7 g/dL (ref 13.0–17.0)
Immature Granulocytes: 0 %
Lymphocytes Relative: 33 %
Lymphs Abs: 1.9 10*3/uL (ref 0.7–4.0)
MCH: 30.7 pg (ref 26.0–34.0)
MCHC: 34 g/dL (ref 30.0–36.0)
MCV: 90.2 fL (ref 80.0–100.0)
Monocytes Absolute: 0.7 10*3/uL (ref 0.1–1.0)
Monocytes Relative: 12 %
Neutro Abs: 3.1 10*3/uL (ref 1.7–7.7)
Neutrophils Relative %: 52 %
Platelets: 201 10*3/uL (ref 150–400)
RBC: 5.12 MIL/uL (ref 4.22–5.81)
RDW: 12.4 % (ref 11.5–15.5)
WBC: 5.8 10*3/uL (ref 4.0–10.5)
nRBC: 0 % (ref 0.0–0.2)

## 2021-07-09 LAB — URINALYSIS, ROUTINE W REFLEX MICROSCOPIC
Bilirubin Urine: NEGATIVE
Glucose, UA: NEGATIVE mg/dL
Hgb urine dipstick: NEGATIVE
Ketones, ur: NEGATIVE mg/dL
Leukocytes,Ua: NEGATIVE
Nitrite: NEGATIVE
Protein, ur: NEGATIVE mg/dL
Specific Gravity, Urine: 1.009 (ref 1.005–1.030)
pH: 6 (ref 5.0–8.0)

## 2021-07-09 LAB — LIPASE, BLOOD: Lipase: 90 U/L — ABNORMAL HIGH (ref 11–51)

## 2021-07-09 MED ORDER — MORPHINE SULFATE (PF) 4 MG/ML IV SOLN
4.0000 mg | Freq: Once | INTRAVENOUS | Status: AC
  Administered 2021-07-09: 4 mg via INTRAVENOUS
  Filled 2021-07-09: qty 1

## 2021-07-09 MED ORDER — METHOCARBAMOL 500 MG PO TABS: 500.0000 mg | ORAL_TABLET | Freq: Two times a day (BID) | ORAL | 0 refills | Status: DC

## 2021-07-09 MED ORDER — IBUPROFEN 600 MG PO TABS: 600.0000 mg | ORAL_TABLET | Freq: Four times a day (QID) | ORAL | 0 refills | Status: DC | PRN

## 2021-07-09 MED ORDER — DICLOFENAC SODIUM 1 % EX GEL: 4.0000 g | Freq: Four times a day (QID) | CUTANEOUS | 0 refills | Status: DC

## 2021-07-09 NOTE — ED Triage Notes (Signed)
Pt arriving via EMS with lower back pain. Pt reports chronic back pain, took flexeril with no relief. Pt also reports having colonoscopy Monday with polyp removal. Pt ambulatory to stretcher with EMS. States it hurts to try to sit up.

## 2021-07-09 NOTE — Discharge Instructions (Addendum)
You have been evaluated for your symptoms.  Your back discomfort may be due to musculoskeletal pain.  Please take medication prescribed.  Use Voltaren gel cream as needed for aches and pain.  Please note your liver function lab is elevated today.  This will need to be rechecked by your primary care doctor in the next 5 to 10 days.  If you develop nausea vomiting unable to tolerate fluid or food or having fever.  Please do not hesitate to return.

## 2021-07-09 NOTE — ED Provider Notes (Signed)
[facility] Provider Note   CSN: [id number] Arrival date & time: 07/09/21  0654     History Chief Complaint  Patient presents with   Back Pain    Harry Williams is a 40 y.o. male.  The history is provided by the patient and medical records. No language interpreter was used.  Back Pain  40 year old male significant history of chronic back pain who was brought here via EMS from home with complaints of lower back pain.  Patient report he has history of prior large intestines removal due to excessive polyps and recently had another polyps removal 5 days ago.  Yesterday he was sitting on the couch watching TV when he developed acute onset of pain to his low back.  Pain is described as a sharp twinge going across his lower back left greater than right happen sporadically but very intense.  Nothing seems to make the pain better or worse.  He did try taking Flexeril and went to bed, woke up with some improvement of pain but is still there.  Pain does not radiate down his legs no bowel bladder incontinence or saddle anesthesia.  He denies having any fever or chills.  He did recall having abdominal discomfort 2 days ago that is since resolved.  No chest pain shortness of breath no cough.  Denies dysuria or hematuria.  No history of kidney stone.  His polyps removal was done through [facility] ([place]).  Past Medical History:  Diagnosis Date   Asthma     Patient Active Problem List   Diagnosis Date Noted   Chronic low back pain 06/04/2016   Paraspinal muscle spasm 02/17/2016   Adenomatous polyp 04/04/2015    Past Surgical History:  Procedure Laterality Date   APPENDECTOMY     COLON SURGERY  03/2015   intestines removal     JOINT REPLACEMENT         Family History  Problem Relation Age of Onset   Diabetes Father    Hypertension Brother    Heart disease Paternal Grandmother    Cancer Paternal Grandfather     Social History   Tobacco Use   Smoking status:  Never   Smokeless tobacco: Never  Substance Use Topics   Alcohol use: Yes   Drug use: No    Home Medications Prior to Admission medications   Medication Sig Start Date End Date Taking? Authorizing Provider  albuterol (PROVENTIL HFA;VENTOLIN HFA) 108 (90 Base) MCG/ACT inhaler Inhale 1-2 puffs into the lungs every 6 (six) hours as needed for wheezing or shortness of breath. 02/14/19   Hedges, Dellis Filbert, PA-C  cyclobenzaprine (FLEXERIL) 10 MG tablet Take 1 tablet (10 mg total) by mouth at bedtime as needed for muscle spasms. 11/10/20   Hall-Potvin, [place], PA-C  naproxen (NAPROSYN) 500 MG tablet Take 1 tablet (500 mg total) by mouth 2 (two) times daily as needed. 11/10/20   Hall-Potvin, [place], PA-C    Allergies    Patient has no known allergies.  Review of Systems   Review of Systems  Musculoskeletal:  Positive for back pain.  All other systems reviewed and are negative.  Physical Exam Updated Vital Signs BP 123/82   Pulse (!) 56   Temp 98.4 F (36.9 C) (Oral)   Resp 16   SpO2 99%   Physical Exam Vitals and nursing note reviewed.  Constitutional:      General: He is not in acute distress.    Appearance: He is well-developed.  HENT:  Head: Atraumatic.  Eyes:     Conjunctiva/sclera: Conjunctivae normal.  Cardiovascular:     Rate and Rhythm: Normal rate and regular rhythm.     Pulses: Normal pulses.     Heart sounds: Normal heart sounds.  Pulmonary:     Effort: Pulmonary effort is normal.     Breath sounds: Normal breath sounds.  Abdominal:     Palpations: Abdomen is soft.     Tenderness: There is no abdominal tenderness. There is no right CVA tenderness or left CVA tenderness.  Musculoskeletal:        General: Tenderness (Minimal tenderness along the lumbar paraspinal region left greater than right.  No significant midline spine tenderness crepitus or step-off noted) present.     Cervical back: Neck supple.     Comments: 5 out of 5 strength to bilateral lower  extremities with intact distal pedal pulses.  Skin:    Findings: No rash.  Neurological:     Mental Status: He is alert.    ED Results / Procedures / Treatments   Labs (all labs ordered are listed, but only abnormal results are displayed) Labs Reviewed  COMPREHENSIVE METABOLIC PANEL - Abnormal; Notable for the following components:      Result Value   AST 75 (*)    ALT 220 (*)    Total Bilirubin 1.3 (*)    All other components within normal limits  LIPASE, BLOOD - Abnormal; Notable for the following components:   Lipase 90 (*)    All other components within normal limits  CBC WITH DIFFERENTIAL/PLATELET  URINALYSIS, ROUTINE W REFLEX MICROSCOPIC    EKG None  Radiology CT RENAL STONE STUDY  Result Date: 07/09/2021 CLINICAL DATA:  Flank pain.  History of colonoscopy on 07/06/2021 EXAM: CT ABDOMEN AND PELVIS WITHOUT CONTRAST TECHNIQUE: Multidetector CT imaging of the abdomen and pelvis was performed following the standard protocol without IV contrast. COMPARISON:  None. FINDINGS: Lower chest: Included lung bases are clear.  Heart size is normal. Hepatobiliary: Unremarkable unenhanced appearance of the liver. No focal liver lesion identified. Gallbladder within normal limits. No hyperdense gallstone. No biliary dilatation. Pancreas: Unremarkable. No pancreatic ductal dilatation or surrounding inflammatory changes. Spleen: Normal in size without focal abnormality. Adrenals/Urinary Tract: Unremarkable adrenal glands. Bilateral kidneys appear within normal limits. No evidence of renal lesion, stone, or hydronephrosis. No ureteral calculi. Urinary bladder is markedly distended to the level of the umbilicus. Stomach/Bowel: Stomach within normal limits. Postsurgical changes from total colectomy with ileoanal anastomosis. There is a prominent loop of ileum within the central abdomen just proximal to the anastomosis dilated up to 4.7 cm in diameter without abrupt transition point. Remainder of the  small bowel appears within normal limits. No focal bowel wall thickening or inflammatory changes. Vascular/Lymphatic: No significant vascular findings are present. No enlarged abdominal or pelvic lymph nodes. Reproductive: Prostate is unremarkable. Other: No free fluid. No abdominopelvic fluid collection. No pneumoperitoneum. No abdominal wall hernia. Musculoskeletal: Avascular necrosis of the left femoral head without evidence of articular surface collapse. 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 Decreased offset of the bilateral femoral head-neck junctions. No acute osseous abnormality. IMPRESSION: 1. Postsurgical changes from total colectomy with ileoanal anastomosis. There is a prominent loop of ileum within the central abdomen just proximal to the anastomosis dilated up to 4.7 cm in diameter without abrupt transition point. If there is clinical concern for stricture or bowel obstipation, a small-bowel follow-through study may be helpful to  assess. 2. Markedly distended urinary bladder to the level of the umbilicus. Correlate for retention. 3. Avascular necrosis of the left femoral head without evidence of articular surface collapse. 4. 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 Electronically Signed   By: Davina Poke D.O.   On: 07/09/2021 09:11    Procedures Procedures   Medications Ordered in ED Medications  morphine 4 MG/ML injection 4 mg (4 mg Intravenous Given 07/09/21 0812)  morphine 4 MG/ML injection 4 mg (4 mg Intravenous Given 07/09/21 1056)    ED Course  I have reviewed the triage vital signs and the nursing notes.  Pertinent labs & imaging results that were available during my care of the patient were reviewed by me and considered in my medical decision making (see chart for details).    MDM Rules/Calculators/A&P                           BP 123/82   Pulse  (!) 56   Temp 98.4 F (36.9 C) (Oral)   Resp 16   Ht '5\' 8"'$  (1.727 m)   Wt 77.1 kg   SpO2 99%   BMI 25.85 kg/m   Final Clinical Impression(s) / ED Diagnoses Final diagnoses:  Muscle spasm of back  Abnormal abdominal CT scan    Rx / DC Orders ED Discharge Orders          Ordered    methocarbamol (ROBAXIN) 500 MG tablet  2 times daily        07/09/21 1201    diclofenac Sodium (VOLTAREN) 1 % GEL  4 times daily        07/09/21 1201    ibuprofen (ADVIL) 600 MG tablet  Every 6 hours PRN        07/09/21 1201           8:11 AM Patient complaining of acute onset of atraumatic left lower back pain going across his lower back.  He had a recent colonoscopy with polyp removal 5 days ago.  He did endorse having some abdominal pain 2 days prior which is since improved.  He does not have any significant reproducible tenderness on exam.  He did try Flexeril at home without adequate relief.  He appears to be in some discomfort when trying to sit up.  Given recent procedures, will obtain abdominal pelvis CT scan for further assessment.  No CVA tenderness no hematuria, low suspicion for kidney stone  9:24 AM Overall reassuring.  An abdominal pelvis CT scan demonstrate a prominent loop of ileum within the central abdomen that appears to be dilated without any abrupt transition point.  However, patient denies having postprandial pain nausea vomiting or inability to pass flatus.  I have low suspicion for SBO causing his symptoms.  Furthermore, CT also shows a markedly distended urinary bladder.  Patient however without difficulty urinating and he does not have any significant suprapubic tenderness.  11:34 AM Patient able to produce 750 mL of urine on his own.  UA without signs of UTI.  Patient without any significant abdominal tenderness on reexamination.  Please note under musculoskeletal patient does have avascular necrosis of left femoral head without evidence of articular surface collapse.  There  are chronic appearing bilateral L5 pars interarticularis defect at the level L5 and S1.  All these are chronic however, I suspect it may contribute to his back pain.  At this time, will  discharge home with symptom control and outpatient follow-up.  Patient is then to return if symptoms worsen.  Patient made aware of transaminitis and will need to have his labs rechecked.  He denies alcohol use denies postprandial pain and denies Tylenol use.  He does not have any right upper quadrant tenderness on exam.   Domenic Moras, PA-C 07/09/21 1202    Arnaldo Natal, MD 07/09/21 [phone]

## 2021-07-24 ENCOUNTER — Encounter: Payer: Self-pay | Admitting: Family Medicine

## 2021-07-24 ENCOUNTER — Other Ambulatory Visit: Payer: Self-pay

## 2021-07-24 ENCOUNTER — Ambulatory Visit (INDEPENDENT_AMBULATORY_CARE_PROVIDER_SITE_OTHER): Payer: BC Managed Care – PPO | Admitting: Family Medicine

## 2021-07-24 VITALS — BP 118/72 | HR 68 | Temp 97.5°F | Ht 68.0 in | Wt 183.0 lb

## 2021-07-24 DIAGNOSIS — F339 Major depressive disorder, recurrent, unspecified: Secondary | ICD-10-CM | POA: Insufficient documentation

## 2021-07-24 DIAGNOSIS — M4306 Spondylolysis, lumbar region: Secondary | ICD-10-CM

## 2021-07-24 DIAGNOSIS — M4726 Other spondylosis with radiculopathy, lumbar region: Secondary | ICD-10-CM | POA: Insufficient documentation

## 2021-07-24 DIAGNOSIS — F419 Anxiety disorder, unspecified: Secondary | ICD-10-CM | POA: Diagnosis not present

## 2021-07-24 DIAGNOSIS — M87052 Idiopathic aseptic necrosis of left femur: Secondary | ICD-10-CM | POA: Diagnosis not present

## 2021-07-24 DIAGNOSIS — F32A Depression, unspecified: Secondary | ICD-10-CM | POA: Diagnosis not present

## 2021-07-24 HISTORY — DX: Idiopathic aseptic necrosis of left femur: M87.052

## 2021-07-24 MED ORDER — HYDROCODONE-ACETAMINOPHEN 5-325 MG PO TABS: 1.0000 | ORAL_TABLET | Freq: Four times a day (QID) | ORAL | 0 refills | Status: AC | PRN

## 2021-07-24 MED ORDER — CELECOXIB 200 MG PO CAPS: 200.0000 mg | ORAL_CAPSULE | Freq: Two times a day (BID) | ORAL | 1 refills | Status: DC

## 2021-07-24 MED ORDER — DULOXETINE HCL 30 MG PO CPEP: 30.0000 mg | ORAL_CAPSULE | Freq: Every day | ORAL | 0 refills | Status: DC

## 2021-07-24 NOTE — Patient Instructions (Signed)
-   Contact your gastroenterologist in regards to short course (6 weeks) of Celebrex (NSAID/anti-inflammatory) - If they are okay with this, start this and continue this daily until follow-up - If they are not okay with this, ask for alternatives, then contact our office for prescription - Start duloxetine once nightly at bedtime - Contact our office towards the end of the duloxetine course (2 weeks) for status update and for new refill - Can dose hydrocodone on an as-needed basis for severe pain not responding to the above - Start physical therapy with referral provided, they will provide home exercises to perform daily - Return for follow-up in 6 weeks

## 2021-07-24 NOTE — Assessment & Plan Note (Signed)
>>  ASSESSMENT AND PLAN FOR LUMBAR SPONDYLOLYSIS WRITTEN ON 07/24/2021 12:18 PM BY Eliga Arvie, [name], MD  Patient with incidentally noted L5-S1 spondylolysis with anterolisthesis, this coupled with adjacent operative procedure requiring immobilization throughout can account for his presenting symptomatology.  He has had mild improvement since onset.  He does bring up distant history of chronic low back pain with this being the most recent episode of exacerbation.  He has required injections unspecified to the lumbar spine in the past while in [place].  Physical examination today reveals paraspinal lower lumbar tenderness, no spinous process tenderness, positive axial load bilaterally, positive stork test, negative straight leg raise, sensorimotor intact bilateral lower extremities.  Given his findings today and clinical history, I have advised a course of scheduled NSAIDs if approved by his gastroenterologist given his complex GI history, as needed hydrocodone, additional refills will be through pain and spine if necessary, this is given due to the severity and acuity of his presenting symptoms, and we will initiate duloxetine with a goal of titrating to 60 mg to treat chronic musculoskeletal pain and comorbid anxiety/depression.  I have also placed a referral for physical therapy to further stabilize the core and deep hip musculature.  Patient return for follow-up in 6 weeks, pending symptomatology at return, can dictate further evaluation with advanced imaging and referral to pain and spine for discussion of maintenance measures.  Patient is understanding and amenable to this plan moving forward.

## 2021-07-24 NOTE — Progress Notes (Signed)
Primary Care / Sports Medicine Office Visit  Patient Information:  Patient ID: Harry Williams, male DOB: [birth year] Age: 39 y.o. MRN: [id number]   Harry Williams is a pleasant 40 y.o. male presenting with the following:  Chief Complaint  Patient presents with   New Patient (Initial Visit)   Establish Care   Hip Pain    Left; diagnosed with avascular necrosis of left femoral head after CT on 07/09/21; back pain associated; taking cyclobenzaprine, methocarbamol, ibuprofen, and diclofenac gel as directed with no relief; 4/10 pain    Review of Systems pertinent details above   Patient Active Problem List   Diagnosis Date Noted   Lumbar spondylolysis 07/24/2021   Avascular necrosis of hip, left (HCC) 07/24/2021   Anxiety and depression 07/24/2021   Closed fracture of nasal bone with routine healing 12/24/2016   Chronic low back pain 06/04/2016   Paraspinal muscle spasm 02/17/2016   Adenomatous polyp 04/04/2015   Past Medical History:  Diagnosis Date   Asthma    Outpatient Encounter Medications as of 07/24/2021  Medication Sig   celecoxib (CELEBREX) 200 MG capsule Take 1 capsule (200 mg total) by mouth 2 (two) times daily. One to 2 tablets by mouth daily as needed for pain.   cyclobenzaprine (FLEXERIL) 10 MG tablet Take 1 tablet (10 mg total) by mouth at bedtime as needed for muscle spasms.   diclofenac Sodium (VOLTAREN) 1 % GEL Apply 4 g topically 4 (four) times daily.   DULoxetine (CYMBALTA) 30 MG capsule Take 1 capsule (30 mg total) by mouth daily.   HYDROcodone-acetaminophen (NORCO/VICODIN) 5-325 MG tablet Take 1 tablet by mouth every 6 (six) hours as needed for up to 5 days for moderate pain.   ibuprofen (ADVIL) 600 MG tablet Take 1 tablet (600 mg total) by mouth every 6 (six) hours as needed for moderate pain.   methocarbamol (ROBAXIN) 500 MG tablet Take 1 tablet (500 mg total) by mouth 2 (two) times daily.   [DISCONTINUED] albuterol (PROVENTIL HFA;VENTOLIN HFA) 108 (90 Base)  MCG/ACT inhaler Inhale 1-2 puffs into the lungs every 6 (six) hours as needed for wheezing or shortness of breath.   [DISCONTINUED] naproxen (NAPROSYN) 500 MG tablet Take 1 tablet (500 mg total) by mouth 2 (two) times daily as needed.   No facility-administered encounter medications on file as of 07/24/2021.   Past Surgical History:  Procedure Laterality Date   APPENDECTOMY     COLON SURGERY  03/30/2015   JOINT REPLACEMENT  2008   partial right elbow; right femoral graft   Total abdominal proctocolectomy, J-pouch, ileal pouch anal anastomosis  2016    Vitals:   07/24/21 0852  BP: 118/72  Pulse: 68  Temp: (!) 97.5 F (36.4 C)  SpO2: 97%   Vitals:   07/24/21 0852  Weight: 183 lb (83 kg)  Height: 5\' 8"  (1.727 m)   Body mass index is 27.83 kg/m.  CT RENAL STONE STUDY  Result Date: 07/09/2021 CLINICAL DATA:  Flank pain.  History of colonoscopy on 07/06/2021 EXAM: CT ABDOMEN AND PELVIS WITHOUT CONTRAST TECHNIQUE: Multidetector CT imaging of the abdomen and pelvis was performed following the standard protocol without IV contrast. COMPARISON:  None. FINDINGS: Lower chest: Included lung bases are clear.  Heart size is normal. Hepatobiliary: Unremarkable unenhanced appearance of the liver. No focal liver lesion identified. Gallbladder within normal limits. No hyperdense gallstone. No biliary dilatation. Pancreas: Unremarkable. No pancreatic ductal dilatation or surrounding inflammatory changes. Spleen: Normal in size without focal abnormality.  Adrenals/Urinary Tract: Unremarkable adrenal glands. Bilateral kidneys appear within normal limits. No evidence of renal lesion, stone, or hydronephrosis. No ureteral calculi. Urinary bladder is markedly distended to the level of the umbilicus. Stomach/Bowel: Stomach within normal limits. Postsurgical changes from total colectomy with ileoanal anastomosis. There is a prominent loop of ileum within the central abdomen just proximal to the anastomosis  dilated up to 4.7 cm in diameter without abrupt transition point. Remainder of the small bowel appears within normal limits. No focal bowel wall thickening or inflammatory changes. Vascular/Lymphatic: No significant vascular findings are present. No enlarged abdominal or pelvic lymph nodes. Reproductive: Prostate is unremarkable. Other: No free fluid. No abdominopelvic fluid collection. No pneumoperitoneum. No abdominal wall hernia. Musculoskeletal: Avascular necrosis of the left femoral head without evidence of articular surface collapse. 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 Decreased offset of the bilateral femoral head-neck junctions. No acute osseous abnormality. IMPRESSION: 1. Postsurgical changes from total colectomy with ileoanal anastomosis. There is a prominent loop of ileum within the central abdomen just proximal to the anastomosis dilated up to 4.7 cm in diameter without abrupt transition point. If there is clinical concern for stricture or bowel obstipation, a small-bowel follow-through study may be helpful to assess. 2. Markedly distended urinary bladder to the level of the umbilicus. Correlate for retention. 3. Avascular necrosis of the left femoral head without evidence of articular surface collapse. 4. 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 Electronically Signed   By: Duanne Guess D.O.   On: 07/09/2021 09:11     Independent interpretation of notes and tests performed by another provider:   None  Procedures performed:   None  Pertinent History, Exam, Impression, and Recommendations:   Lumbar spondylolysis Patient with incidentally noted L5-S1 spondylolysis with anterolisthesis, this coupled with adjacent operative procedure requiring immobilization throughout can account for his presenting symptomatology.  He has had mild improvement since onset.  He does  bring up distant history of chronic low back pain with this being the most recent episode of exacerbation.  He has required injections unspecified to the lumbar spine in the past while in [place].  Physical examination today reveals paraspinal lower lumbar tenderness, no spinous process tenderness, positive axial load bilaterally, positive stork test, negative straight leg raise, sensorimotor intact bilateral lower extremities.  Given his findings today and clinical history, I have advised a course of scheduled NSAIDs if approved by his gastroenterologist given his complex GI history, as needed hydrocodone, additional refills will be through pain and spine if necessary, this is given due to the severity and acuity of his presenting symptoms, and we will initiate duloxetine with a goal of titrating to 60 mg to treat chronic musculoskeletal pain and comorbid anxiety/depression.  I have also placed a referral for physical therapy to further stabilize the core and deep hip musculature.  Patient return for follow-up in 6 weeks, pending symptomatology at return, can dictate further evaluation with advanced imaging and referral to pain and spine for discussion of maintenance measures.  Patient is understanding and amenable to this plan moving forward.  Avascular necrosis of hip, left (HCC) This is a chronic issue that appears stable, incidentally noted limited avascular necrosis of the left hip, provocative testing today is benign, this is in the setting of prior steroid exposure, high-level athletics with football, and comorbid lumbar pathology.  At this stage given its limited symptomatology, medications were prescribed  inclusive of duloxetine which should provide benefit for this as well as physical therapy for which have included the left hip.  We will follow-up on this issue at his return.  Anxiety and depression Depression screen [facility] 2/9 07/24/2021 03/24/2016 02/17/2016 06/04/2015  Decreased Interest 2 0 0 0   Down, Depressed, Hopeless 2 0 0 0  PHQ - 2 Score 4 0 0 0  Altered sleeping 0 - - -  Tired, decreased energy 3 - - -  Change in appetite 0 - - -  Feeling bad or failure about yourself  1 - - -  Trouble concentrating 3 - - -  Moving slowly or fidgety/restless 1 - - -  Suicidal thoughts 0 - - -  PHQ-9 Score 12 - - -  Difficult doing work/chores Very difficult - - -   GAD 7 : Generalized Anxiety Score 07/24/2021  Nervous, Anxious, on Edge 3  Control/stop worrying 3  Worry too much - different things 3  Trouble relaxing 3  Restless 2  Easily annoyed or irritable 2  Afraid - awful might happen 1  Total GAD 7 Score 17  Anxiety Difficulty Very difficult   Given scoring above, patient stated concern over the same, pharmacotherapy among other treatment options were discussed.  He is amenable to initiation of duloxetine 30 mg, he will dose this daily x2 weeks, status update in 2 weeks, if tolerating well plan to titrate to 60 mg.  For recalcitrant symptoms, further titration to be considered as is referral to psychiatry if needed.   Orders & Medications Meds ordered this encounter  Medications   DULoxetine (CYMBALTA) 30 MG capsule    Sig: Take 1 capsule (30 mg total) by mouth daily.    Dispense:  14 capsule    Refill:  0   celecoxib (CELEBREX) 200 MG capsule    Sig: Take 1 capsule (200 mg total) by mouth 2 (two) times daily. One to 2 tablets by mouth daily as needed for pain.    Dispense:  60 capsule    Refill:  1   HYDROcodone-acetaminophen (NORCO/VICODIN) 5-325 MG tablet    Sig: Take 1 tablet by mouth every 6 (six) hours as needed for up to 5 days for moderate pain.    Dispense:  20 tablet    Refill:  0   Orders Placed This Encounter  Procedures   Ambulatory referral to Physical Therapy     Return in about 6 weeks (around 09/04/2021).     [name], MD   Primary Care Sports Medicine [facility]

## 2021-07-24 NOTE — Assessment & Plan Note (Signed)
Patient with incidentally noted L5-S1 spondylolysis with anterolisthesis, this coupled with adjacent operative procedure requiring immobilization throughout can account for his presenting symptomatology.  He has had mild improvement since onset.  He does bring up distant history of chronic low back pain with this being the most recent episode of exacerbation.  He has required injections unspecified to the lumbar spine in the past while in [place].  Physical examination today reveals paraspinal lower lumbar tenderness, no spinous process tenderness, positive axial load bilaterally, positive stork test, negative straight leg raise, sensorimotor intact bilateral lower extremities.  Given his findings today and clinical history, I have advised a course of scheduled NSAIDs if approved by his gastroenterologist given his complex GI history, as needed hydrocodone, additional refills will be through pain and spine if necessary, this is given due to the severity and acuity of his presenting symptoms, and we will initiate duloxetine with a goal of titrating to 60 mg to treat chronic musculoskeletal pain and comorbid anxiety/depression.  I have also placed a referral for physical therapy to further stabilize the core and deep hip musculature.  Patient return for follow-up in 6 weeks, pending symptomatology at return, can dictate further evaluation with advanced imaging and referral to pain and spine for discussion of maintenance measures.  Patient is understanding and amenable to this plan moving forward.

## 2021-07-24 NOTE — Assessment & Plan Note (Signed)
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  At this stage given its limited symptomatology, medications were prescribed inclusive of duloxetine which should provide benefit for this as well as physical therapy for which have included the left hip.  We will follow-up on this issue at his return.

## 2021-07-24 NOTE — Assessment & Plan Note (Signed)
>>  ASSESSMENT AND PLAN FOR ANXIETY AND DEPRESSION WRITTEN ON 07/24/2021 12:22 PM BY Nekeshia Lenhardt, SELINDA PARAS, MD  Depression screen [facility] 2/9 07/24/2021 03/24/2016 02/17/2016 06/04/2015  Decreased Interest 2 0 0 0  Down, Depressed, Hopeless 2 0 0 0  PHQ - 2 Score 4 0 0 0  Altered sleeping 0 - - -  Tired, decreased energy 3 - - -  Change in appetite 0 - - -  Feeling bad or failure about yourself  1 - - -  Trouble concentrating 3 - - -  Moving slowly or fidgety/restless 1 - - -  Suicidal thoughts 0 - - -  PHQ-9 Score 12 - - -  Difficult doing work/chores Very difficult - - -   GAD 7 : Generalized Anxiety Score 07/24/2021  Nervous, Anxious, on Edge 3  Control/stop worrying 3  Worry too much - different things 3  Trouble relaxing 3  Restless 2  Easily annoyed or irritable 2  Afraid - awful might happen 1  Total GAD 7 Score 17  Anxiety Difficulty Very difficult   Given scoring above, patient stated concern over the same, pharmacotherapy among other treatment options were discussed.  He is amenable to initiation of duloxetine  30 mg, he will dose this daily x2 weeks, status update in 2 weeks, if tolerating well plan to titrate to 60 mg.  For recalcitrant symptoms, further titration to be considered as is referral to psychiatry if needed.

## 2021-07-24 NOTE — Assessment & Plan Note (Signed)
Depression screen [facility] 2/9 07/24/2021 03/24/2016 02/17/2016 06/04/2015  Decreased Interest 2 0 0 0  Down, Depressed, Hopeless 2 0 0 0  PHQ - 2 Score 4 0 0 0  Altered sleeping 0 - - -  Tired, decreased energy 3 - - -  Change in appetite 0 - - -  Feeling bad or failure about yourself  1 - - -  Trouble concentrating 3 - - -  Moving slowly or fidgety/restless 1 - - -  Suicidal thoughts 0 - - -  PHQ-9 Score 12 - - -  Difficult doing work/chores Very difficult - - -   GAD 7 : Generalized Anxiety Score 07/24/2021  Nervous, Anxious, on Edge 3  Control/stop worrying 3  Worry too much - different things 3  Trouble relaxing 3  Restless 2  Easily annoyed or irritable 2  Afraid - awful might happen 1  Total GAD 7 Score 17  Anxiety Difficulty Very difficult   Given scoring above, patient stated concern over the same, pharmacotherapy among other treatment options were discussed.  He is amenable to initiation of duloxetine 30 mg, he will dose this daily x2 weeks, status update in 2 weeks, if tolerating well plan to titrate to 60 mg.  For recalcitrant symptoms, further titration to be considered as is referral to psychiatry if needed.

## 2021-07-29 ENCOUNTER — Ambulatory Visit: Payer: Self-pay | Admitting: Family Medicine

## 2021-08-14 ENCOUNTER — Ambulatory Visit: Payer: BC Managed Care – PPO | Attending: Family Medicine

## 2021-08-14 ENCOUNTER — Other Ambulatory Visit: Payer: Self-pay

## 2021-08-14 DIAGNOSIS — M4306 Spondylolysis, lumbar region: Secondary | ICD-10-CM | POA: Diagnosis not present

## 2021-08-14 DIAGNOSIS — G8929 Other chronic pain: Secondary | ICD-10-CM | POA: Diagnosis present

## 2021-08-14 DIAGNOSIS — M87052 Idiopathic aseptic necrosis of left femur: Secondary | ICD-10-CM | POA: Diagnosis present

## 2021-08-14 DIAGNOSIS — M545 Low back pain, unspecified: Secondary | ICD-10-CM

## 2021-08-14 DIAGNOSIS — M5386 Other specified dorsopathies, lumbar region: Secondary | ICD-10-CM | POA: Diagnosis present

## 2021-08-15 NOTE — Therapy (Signed)
Status New    Target Date 10/03/21      PT LONG TERM GOAL #3   Title increase trunk ROM to WNLs from minimal limitations to minimize low back strain    Status New    Target Date 10/03/21      PT LONG TERM GOAL #4   Title Decrease pt's low back pain to 0-4/10  pain range for improved quality of sleep and improved tolarance for sitting and phyical activity    Baseline 3-10/10    Status New    Target Date 10/03/21                    Plan - 08/15/21 0643     Clinical Impression Statement Pt present's to PT the a Hx of chronic low back pain (since 2000) and the Dx of spondylosis. Pt's low back pain is most significant with sustained positions or activity, esp sleeping. Pt sleeps on his stomach most of the time. Pt denies L hip pain and it was not provoked during assessment. Pt has min. decreased trunk ROM, good LE strength, tight hamstrings. See Education for initial interventions. Pt will benefit from skilled PT to for ROM/flexibility, core strengthening, and education to decrease pain and optimize fuctional mobility and QOL.    Personal Factors and Comorbidities Comorbidity 1;Time since onset of injury/illness/exacerbation    Examination-Activity Limitations Sleep;Sit    Examination-Participation Restrictions Occupation    Stability/Clinical Decision Making Stable/Uncomplicated    Clinical Decision Making Low    Rehab Potential Good    PT Frequency 2x / week    PT Duration 6 weeks    PT Treatment/Interventions ADLs/Self Care Home Management;Aquatic Therapy;Cryotherapy;Electrical Stimulation;Ultrasound;Traction;Moist Heat;Iontophoresis '4mg'$ /ml Dexamethasone;Therapeutic exercise;Therapeutic activities;Manual techniques;Patient/family education;Passive range of motion;Dry needling;Taping;Spinal Manipulations    PT Next Visit Plan Assess repsonse to HEP and education. Assess hip flexor-Thomas test. Progress ther ex as indicated.    PT Home Exercise Plan [facility]    Consulted and Agree with Plan of Care Patient             Patient will benefit from skilled therapeutic intervention in order to improve the following deficits and impairments:  Decreased range of motion, Pain, Decreased activity tolerance, Impaired flexibility, Decreased  strength  Visit Diagnosis: Lumbar spondylolysis  Avascular necrosis of hip, left (HCC)  Decreased ROM of lumbar spine  Chronic midline low back pain, unspecified whether sciatica present     Problem List Patient Active Problem List   Diagnosis Date Noted   Lumbar spondylolysis 07/24/2021   Avascular necrosis of hip, left ([place]) 07/24/2021   Anxiety and depression 07/24/2021   Closed fracture of nasal bone with routine healing 12/24/2016   Chronic low back pain 06/04/2016   Paraspinal muscle spasm 02/17/2016   Adenomatous polyp 04/04/2015    Gar Ponto, PT 08/15/2021, 12:39 PM  [facility] [address] [place], [place], [address] Phone: [phone]   Fax:  [phone]  Name: Harry Williams MRN: [id number] Date of Birth: 20-Mar-1981  [place], [place], [address] Phone: [phone]   Fax:  [phone]  Physical Therapy Evaluation  Patient Details  Name: Harry Williams MRN: [id number] Date of Birth: February 14, 1981 Referring Provider (PT): Montel Culver, MD   Encounter Date: 08/14/2021   PT End of Session - 08/15/21 0705     Visit Number 1    Number of Visits 13    Date for PT Re-Evaluation 10/03/21    Authorization Type BCBS COMM PPO    PT Start Time 0845    PT Stop Time 0935    PT Time Calculation (min) 50 min    Activity Tolerance Patient tolerated treatment well    Behavior During Therapy [facility] for tasks assessed/performed             Past Medical History:  Diagnosis Date   Asthma     Past Surgical History:  Procedure Laterality Date   APPENDECTOMY     COLON SURGERY  03/30/2015   JOINT REPLACEMENT  2008   partial right elbow; right femoral graft   Total abdominal proctocolectomy, J-pouch, ileal pouch anal anastomosis  2016    There were no vitals filed for this visit.    Subjective Assessment - 08/14/21 0902     Subjective Pt reports a Hx of low back pain since 2000 related to sports participation and wrosening over time. Pt notes the most  difficulty with sleep. There are times when the pain can be significant. Pt denies hip pain.    How long can you sit comfortably? up to 3 hours    How long can you stand comfortably? unsure    How long can you walk comfortably? unsure    Diagnostic tests xrays have been taken as per pt    Patient Stated Goals To strengthening the core and to decrease and/or manage the pain    Currently in Pain? Yes    Pain Score 3    pain range 3-10/10   Pain Location Back    Pain Orientation Right;Left;Posterior    Pain Descriptors / Indicators Aching;Throbbing;Sharp    Pain Type Chronic pain    Pain Radiating Towards sometimes bilat buttocks and occasional LEs    Pain Onset More than a  month ago    Pain Frequency Constant    Aggravating Factors  Sleep, prolonged activity    Pain Relieving Factors Medications                [facility] PT Assessment - 08/15/21 0001       Assessment   Medical Diagnosis Lumbar spondylolysis, Avascular necrosis of hip, left    Referring Provider (PT) Montel Culver, MD    Onset Date/Surgical Date --   Since 2000, sports related injury   Hand Dominance Left    Prior Therapy yes- long time ago      Precautions   Precautions None      Restrictions   Weight Bearing Restrictions Yes      Balance Screen   Has the patient fallen in the past 6 months No    Has the patient had a decrease in activity level because of a fear of falling?  No    Is the patient reluctant to leave their home because of a fear of falling?  No      Home Social worker Private residence    Living Arrangements Spouse/significant other;Children    Type of [place]  Status New    Target Date 10/03/21      PT LONG TERM GOAL #3   Title increase trunk ROM to WNLs from minimal limitations to minimize low back strain    Status New    Target Date 10/03/21      PT LONG TERM GOAL #4   Title Decrease pt's low back pain to 0-4/10  pain range for improved quality of sleep and improved tolarance for sitting and phyical activity    Baseline 3-10/10    Status New    Target Date 10/03/21                    Plan - 08/15/21 0643     Clinical Impression Statement Pt present's to PT the a Hx of chronic low back pain (since 2000) and the Dx of spondylosis. Pt's low back pain is most significant with sustained positions or activity, esp sleeping. Pt sleeps on his stomach most of the time. Pt denies L hip pain and it was not provoked during assessment. Pt has min. decreased trunk ROM, good LE strength, tight hamstrings. See Education for initial interventions. Pt will benefit from skilled PT to for ROM/flexibility, core strengthening, and education to decrease pain and optimize fuctional mobility and QOL.    Personal Factors and Comorbidities Comorbidity 1;Time since onset of injury/illness/exacerbation    Examination-Activity Limitations Sleep;Sit    Examination-Participation Restrictions Occupation    Stability/Clinical Decision Making Stable/Uncomplicated    Clinical Decision Making Low    Rehab Potential Good    PT Frequency 2x / week    PT Duration 6 weeks    PT Treatment/Interventions ADLs/Self Care Home Management;Aquatic Therapy;Cryotherapy;Electrical Stimulation;Ultrasound;Traction;Moist Heat;Iontophoresis '4mg'$ /ml Dexamethasone;Therapeutic exercise;Therapeutic activities;Manual techniques;Patient/family education;Passive range of motion;Dry needling;Taping;Spinal Manipulations    PT Next Visit Plan Assess repsonse to HEP and education. Assess hip flexor-Thomas test. Progress ther ex as indicated.    PT Home Exercise Plan [facility]    Consulted and Agree with Plan of Care Patient             Patient will benefit from skilled therapeutic intervention in order to improve the following deficits and impairments:  Decreased range of motion, Pain, Decreased activity tolerance, Impaired flexibility, Decreased  strength  Visit Diagnosis: Lumbar spondylolysis  Avascular necrosis of hip, left (HCC)  Decreased ROM of lumbar spine  Chronic midline low back pain, unspecified whether sciatica present     Problem List Patient Active Problem List   Diagnosis Date Noted   Lumbar spondylolysis 07/24/2021   Avascular necrosis of hip, left ([place]) 07/24/2021   Anxiety and depression 07/24/2021   Closed fracture of nasal bone with routine healing 12/24/2016   Chronic low back pain 06/04/2016   Paraspinal muscle spasm 02/17/2016   Adenomatous polyp 04/04/2015    Gar Ponto, PT 08/15/2021, 12:39 PM  [facility] [address] [place], [place], [address] Phone: [phone]   Fax:  [phone]  Name: Harry Williams MRN: [id number] Date of Birth: 20-Mar-1981

## 2021-08-21 ENCOUNTER — Encounter: Payer: Self-pay | Admitting: Physical Therapy

## 2021-08-21 ENCOUNTER — Other Ambulatory Visit: Payer: Self-pay

## 2021-08-21 ENCOUNTER — Ambulatory Visit: Payer: BC Managed Care – PPO | Admitting: Physical Therapy

## 2021-08-21 DIAGNOSIS — M545 Low back pain, unspecified: Secondary | ICD-10-CM

## 2021-08-21 DIAGNOSIS — M4306 Spondylolysis, lumbar region: Secondary | ICD-10-CM | POA: Diagnosis not present

## 2021-08-21 DIAGNOSIS — M5386 Other specified dorsopathies, lumbar region: Secondary | ICD-10-CM

## 2021-08-21 DIAGNOSIS — M87052 Idiopathic aseptic necrosis of left femur: Secondary | ICD-10-CM

## 2021-08-21 DIAGNOSIS — G8929 Other chronic pain: Secondary | ICD-10-CM

## 2021-08-21 NOTE — Therapy (Signed)
[place], [place], [address] Phone: [phone]   Fax:  [phone]  Physical Therapy Treatment  Patient Details  Name: Harry Williams MRN: [id number] Date of Birth: 1980/12/03 Referring Provider (PT): Montel Culver, MD   Encounter Date: 08/21/2021   PT End of Session - 08/21/21 1034     Visit Number 2    Number of Visits 13    Date for PT Re-Evaluation 10/03/21    Authorization Type BCBS COMM PPO    PT Start Time 1035    PT Stop Time 1115    PT Time Calculation (min) 40 min    Activity Tolerance Patient tolerated treatment well    Behavior During Therapy [facility] for tasks assessed/performed             Past Medical History:  Diagnosis Date   Asthma     Past Surgical History:  Procedure Laterality Date   APPENDECTOMY     COLON SURGERY  03/30/2015   JOINT REPLACEMENT  2008   partial right elbow; right femoral graft   Total abdominal proctocolectomy, J-pouch, ileal pouch anal anastomosis  2016    There were no vitals filed for this visit.   Subjective Assessment - 08/21/21 1040     Subjective Pt reports that his pain is roughly the same today.  He has been somewhat HEP compliant. His LBP is 3/10 today with no radiatoin to LE.  Aggs: sleeping, movment Eases:  Fetal position Denies unexplained weight loss    How long can you sit comfortably? up to 3 hours    How long can you stand comfortably? unsure    How long can you walk comfortably? unsure    Diagnostic tests xrays have been taken as per pt    Patient Stated Goals To strengthening the core and to decrease and/or manage the pain    Pain Onset More than a month ago              [facility] Adult PT Treatment/Exercise:  Therapeutic Exercise:  - nu-step L5 58m while taking subjective and planning session with patient - LTR - 20x - PPT - 5'' hold - 10x - SKTC 2x 45'' ea - DKTC with gentle rocking- 2x 45'' - bridge - 2x10 5'' -  supine clam - RTB - alternating - 2x20 - hip adduction squeeze - 5'' 2x10 - bird dog - 2x10 - partial ROM child's pose - 30'' x2    PT Short Term Goals - 08/15/21 1227       PT SHORT TERM GOAL #1   Title Pt will be ind in an initial HEP.    Status New    Target Date 09/05/21      PT SHORT TERM GOAL #2   Title Pt will voice understanding of measures to decrease pain.    Status New    Target Date 09/05/21               PT Long Term Goals - 08/15/21 1228       PT LONG TERM GOAL #1   Title Pt will b Ind in a final HEP to maintain achieved LOF    Status New    Target Date 10/03/21      PT LONG TERM GOAL #2   Title Increase bilat hamstring flexibility to 55d to minimize low back strain    Baseline 45d    Status New    Target Date 10/03/21  PT LONG TERM GOAL #3   Title increase trunk ROM to WNLs from minimal limitations to minimize low back strain    Status New    Target Date 10/03/21      PT LONG TERM GOAL #4   Title Decrease pt's low back pain to 0-4/10 pain range for improved quality of sleep and improved tolarance for sitting and phyical activity    Baseline 3-10/10    Status New    Target Date 10/03/21                   Plan - 08/21/21 1104     Clinical Impression Statement Pt reports no increase in baseline pain following therapy  HEP was reviewed, but left unchanged    Overall, Harry Williams is progressing well with therapy.  Today we concentrated on core strengthening and hip strengthening.  Pt is able to complete all exercises with good form with min cuing.  He is challenged by bridge with transient discomfort of lumbar extensors.  Quad tightness with child's pose.  Pt will continue to benefit from skilled physical therapy to address remaining deficits and achieve listed goals.  Continue per POC.    Personal Factors and Comorbidities Comorbidity 1;Time since onset of injury/illness/exacerbation    Examination-Activity Limitations Sleep;Sit     Examination-Participation Restrictions Occupation    Stability/Clinical Decision Making Stable/Uncomplicated    Rehab Potential Good    PT Frequency 2x / week    PT Duration 6 weeks    PT Treatment/Interventions ADLs/Self Care Home Management;Aquatic Therapy;Cryotherapy;Electrical Stimulation;Ultrasound;Traction;Moist Heat;Iontophoresis 4mg /ml Dexamethasone;Therapeutic exercise;Therapeutic activities;Manual techniques;Patient/family education;Passive range of motion;Dry needling;Taping;Spinal Manipulations    PT Next Visit Plan Assess repsonse to HEP and education. Assess hip flexor-Thomas test (quad tightness present). Progress ther ex as indicated.    PT Home Exercise Plan [facility]    Consulted and Agree with Plan of Care Patient             Patient will benefit from skilled therapeutic intervention in order to improve the following deficits and impairments:  Decreased range of motion, Pain, Decreased activity tolerance, Impaired flexibility, Decreased strength  Visit Diagnosis: Lumbar spondylolysis  Avascular necrosis of hip, left (HCC)  Decreased ROM of lumbar spine  Chronic midline low back pain, unspecified whether sciatica present     Problem List Patient Active Problem List   Diagnosis Date Noted   Lumbar spondylolysis 07/24/2021   Avascular necrosis of hip, left ([place]) 07/24/2021   Anxiety and depression 07/24/2021   Closed fracture of nasal bone with routine healing 12/24/2016   Chronic low back pain 06/04/2016   Paraspinal muscle spasm 02/17/2016   Adenomatous polyp 04/04/2015    Harry Williams, PT 08/21/2021, 11:19 AM  [facility] [address] [place], [place], [address] Phone: [phone]   Fax:  [phone]  Name: Harry Williams MRN: [id number] Date of Birth: [birth year]

## 2021-08-26 ENCOUNTER — Encounter: Payer: Self-pay | Admitting: Physical Therapy

## 2021-08-26 ENCOUNTER — Ambulatory Visit: Payer: BC Managed Care – PPO

## 2021-08-26 ENCOUNTER — Other Ambulatory Visit: Payer: Self-pay

## 2021-08-26 ENCOUNTER — Ambulatory Visit: Payer: BC Managed Care – PPO | Admitting: Physical Therapy

## 2021-08-26 DIAGNOSIS — M87052 Idiopathic aseptic necrosis of left femur: Secondary | ICD-10-CM

## 2021-08-26 DIAGNOSIS — M4306 Spondylolysis, lumbar region: Secondary | ICD-10-CM

## 2021-08-26 DIAGNOSIS — M5386 Other specified dorsopathies, lumbar region: Secondary | ICD-10-CM

## 2021-08-26 DIAGNOSIS — M545 Low back pain, unspecified: Secondary | ICD-10-CM

## 2021-08-26 NOTE — Therapy (Signed)
[facility] [address] [place], [place], [address] Phone: [phone]   Fax:  [phone]  Physical Therapy Treatment  Patient Details  Name: Harry Williams MRN: [id number] Date of Birth: July 06, 1981 Referring Provider (PT): [name], MD   Encounter Date: 08/26/2021   PT End of Session - 08/26/21 1001     Visit Number 3    Number of Visits 13    Date for PT Re-Evaluation 10/03/21    Authorization Type BCBS COMM PPO    PT Start Time 1000    PT Stop Time 1045    PT Time Calculation (min) 45 min    Activity Tolerance Patient tolerated treatment well    Behavior During Therapy [facility] for tasks assessed/performed             Past Medical History:  Diagnosis Date   Asthma     Past Surgical History:  Procedure Laterality Date   APPENDECTOMY     COLON SURGERY  03/30/2015   JOINT REPLACEMENT  2008   partial right elbow; right femoral graft   Total abdominal proctocolectomy, J-pouch, ileal pouch anal anastomosis  2016    There were no vitals filed for this visit.   Subjective Assessment - 08/26/21 1004     Subjective Pt reports that he has seen improvement since begining thrapy.  He has been somewhat HEP compliant. His LBP is 3/10 today with no radiatoin to LE.  Aggs: sleeping, movment Eases:  Fetal position Denies unexplained weight loss    How long can you sit comfortably? up to 3 hours    How long can you stand comfortably? unsure    How long can you walk comfortably? unsure    Diagnostic tests xrays have been taken as per pt    Patient Stated Goals To strengthening the core and to decrease and/or manage the pain    Pain Onset More than a month ago                [facility] Adult PT Treatment/Exercise:   Therapeutic Exercise:   - nu-step L5 69m while taking subjective and planning session with patient - LE X over 10x ea - PPT - 5'' hold - 10x - SKTC 2x 45'' ea - DKTC with gentle rocking- 2x 45'' -  bridge - 2x10 10'' - supine clam - GTB - alternating - 2x20 - hip adduction squeeze - 5'' 2x10 - bird dog - 2x10 5'' hold - partial ROM child's pose - 30'' x2 - pallof press 2x10 GTB - 2x10 ea     PT Short Term Goals - 08/15/21 1227       PT SHORT TERM GOAL #1   Title Pt will be ind in an initial HEP.    Status New    Target Date 09/05/21      PT SHORT TERM GOAL #2   Title Pt will voice understanding of measures to decrease pain.    Status New    Target Date 09/05/21               PT Long Term Goals - 08/15/21 1228       PT LONG TERM GOAL #1   Title Pt will b Ind in a final HEP to maintain achieved LOF    Status New    Target Date 10/03/21      PT LONG TERM GOAL #2   Title Increase bilat hamstring flexibility to 55d to minimize low back strain  Baseline 45d    Status New    Target Date 10/03/21      PT LONG TERM GOAL #3   Title increase trunk ROM to WNLs from minimal limitations to minimize low back strain    Status New    Target Date 10/03/21      PT LONG TERM GOAL #4   Title Decrease pt's low back pain to 0-4/10 pain range for improved quality of sleep and improved tolarance for sitting and phyical activity    Baseline 3-10/10    Status New    Target Date 10/03/21                   Plan - 08/26/21 1034     Clinical Impression Statement Pt reports mild pain reduction following therapy  HEP was reviewed, but left unchanged  Overall, Harry Williams is progressing well with therapy. Today we concentrated on core strengthening and hip strengthening. Pt progressed to standing core stability with palof press today to good effect. Will continue to progress core and hip as able. Pt will continue to benefit from skilled physical therapy to address remaining deficits and achieve listed goals. Continue per POC.    Personal Factors and Comorbidities Comorbidity 1;Time since onset of injury/illness/exacerbation    Examination-Activity Limitations  Sleep;Sit    Examination-Participation Restrictions Occupation    Stability/Clinical Decision Making Stable/Uncomplicated    Rehab Potential Good    PT Frequency 2x / week    PT Duration 6 weeks    PT Treatment/Interventions ADLs/Self Care Home Management;Aquatic Therapy;Cryotherapy;Electrical Stimulation;Ultrasound;Traction;Moist Heat;Iontophoresis 4mg /ml Dexamethasone;Therapeutic exercise;Therapeutic activities;Manual techniques;Patient/family education;Passive range of motion;Dry needling;Taping;Spinal Manipulations    PT Next Visit Plan Assess repsonse to HEP and education. Assess hip flexor-Thomas test (quad tightness present). Progress ther ex as indicated.    PT Home Exercise Plan [facility]    Consulted and Agree with Plan of Care Patient             Patient will benefit from skilled therapeutic intervention in order to improve the following deficits and impairments:  Decreased range of motion, Pain, Decreased activity tolerance, Impaired flexibility, Decreased strength  Visit Diagnosis: Lumbar spondylolysis  Avascular necrosis of hip, left (HCC)  Decreased ROM of lumbar spine  Chronic midline low back pain, unspecified whether sciatica present     Problem List Patient Active Problem List   Diagnosis Date Noted   Lumbar spondylolysis 07/24/2021   Avascular necrosis of hip, left (HCC) 07/24/2021   Anxiety and depression 07/24/2021   Closed fracture of nasal bone with routine healing 12/24/2016   Chronic low back pain 06/04/2016   Paraspinal muscle spasm 02/17/2016   Adenomatous polyp 04/04/2015    Fredderick Phenix, PT 08/26/2021, 10:44 AM  [facility] [address] [place], [place], [address] Phone: [phone]   Fax:  [phone]  Name: Harry Williams MRN: [id number] Date of Birth: [birth year]

## 2021-09-04 ENCOUNTER — Ambulatory Visit (INDEPENDENT_AMBULATORY_CARE_PROVIDER_SITE_OTHER): Payer: BC Managed Care – PPO | Admitting: Family Medicine

## 2021-09-04 ENCOUNTER — Ambulatory Visit
Admission: RE | Admit: 2021-09-04 | Discharge: 2021-09-04 | Disposition: A | Discharge status: Home / Self Care | Payer: BC Managed Care – PPO | Attending: Family Medicine | Admitting: Family Medicine

## 2021-09-04 ENCOUNTER — Encounter: Payer: Self-pay | Admitting: Family Medicine

## 2021-09-04 ENCOUNTER — Other Ambulatory Visit: Payer: Self-pay

## 2021-09-04 ENCOUNTER — Ambulatory Visit
Admission: RE | Admit: 2021-09-04 | Discharge: 2021-09-04 | Disposition: A | Discharge status: Home / Self Care | Payer: BC Managed Care – PPO | Source: Ambulatory Visit | Attending: Family Medicine | Admitting: Family Medicine

## 2021-09-04 VITALS — BP 112/74 | HR 70 | Temp 98.4°F | Ht 68.0 in | Wt 179.0 lb

## 2021-09-04 DIAGNOSIS — M5412 Radiculopathy, cervical region: Secondary | ICD-10-CM | POA: Insufficient documentation

## 2021-09-04 DIAGNOSIS — M4306 Spondylolysis, lumbar region: Secondary | ICD-10-CM

## 2021-09-04 DIAGNOSIS — Z0000 Encounter for general adult medical examination without abnormal findings: Secondary | ICD-10-CM | POA: Diagnosis not present

## 2021-09-04 DIAGNOSIS — F419 Anxiety disorder, unspecified: Secondary | ICD-10-CM

## 2021-09-04 DIAGNOSIS — F32A Depression, unspecified: Secondary | ICD-10-CM

## 2021-09-04 DIAGNOSIS — R7989 Other specified abnormal findings of blood chemistry: Secondary | ICD-10-CM

## 2021-09-04 DIAGNOSIS — M87052 Idiopathic aseptic necrosis of left femur: Secondary | ICD-10-CM

## 2021-09-04 MED ORDER — DULOXETINE HCL 30 MG PO CPEP: ORAL_CAPSULE | ORAL | 0 refills | Status: DC

## 2021-09-04 MED ORDER — CELECOXIB 200 MG PO CAPS: 200.0000 mg | ORAL_CAPSULE | Freq: Two times a day (BID) | ORAL | 1 refills | Status: DC

## 2021-09-04 NOTE — Assessment & Plan Note (Signed)
>>  ASSESSMENT AND PLAN FOR ANNUAL PHYSICAL EXAM WRITTEN ON 09/04/2021 10:53 AM BY Otha Monical, [name], MD  Annual examination completed, risk stratification labs obtained, anticipatory guidance provided, we will follow labs once resulted.

## 2021-09-04 NOTE — Assessment & Plan Note (Signed)
>>  ASSESSMENT AND PLAN FOR LUMBAR SPONDYLOLYSIS WRITTEN ON 09/04/2021 10:51 AM BY Vennessa Affinito, [name], MD  Chronic issue that is stable, has been attending physical therapy, did not dose any prescriptions for last visit.  I did describe the nature of these medications, role in his ongoing treatment, and he is amenable to initiate duloxetine at 30 mg nightly x2 weeks, escalate to 60 mg nightly, and follow-up in 4 weeks.  He can dose Celebrex as needed, other medications have been discontinued.

## 2021-09-04 NOTE — Assessment & Plan Note (Signed)
Annual examination completed, risk stratification labs obtained, anticipatory guidance provided, we will follow labs once resulted.

## 2021-09-04 NOTE — Patient Instructions (Addendum)
-   Obtain neck x-rays with orders provided - Start duloxetine 30 mg nightly, after 2 weeks increase to 60 mg nightly (2 capsules) - Can dose Celebrex on an as-needed basis for pain - Continue with physical therapy and - Obtain fasting labs with orders provided (can have water or black coffee but otherwise no food or drink x 8 hours before labs) - Review information provided - Attend eye doctor annually, dentist every 6 months, work towards or maintain 30 minutes of moderate intensity physical activity at least 5 days per week, and consume a balanced diet - Return in 4 weeks for reevaluation - Contact us for any questions between now and then

## 2021-09-04 NOTE — Assessment & Plan Note (Signed)
Elevated PHQ and GAD again noted, essentially stable, did not have an opportunity initiate duloxetine, I have discussed the potential dual benefit of this medication in light of his current medical history and he is amenable to starting this.  We will follow this up in 4 weeks time.

## 2021-09-04 NOTE — Progress Notes (Signed)
Annual Physical Exam Visit  Patient Information:  Patient ID: Harry Williams, male DOB: Apr 10, 1981 Age: 40 y.o. MRN: [id number]   Subjective:   CC: Annual Physical Exam  HPI:  Harry Williams is here for their annual physical.  I reviewed the past medical history, family history, social history, surgical history, and allergies today and changes were made as necessary.  Please see the problem list section below for additional details.  Past Medical History: Past Medical History:  Diagnosis Date   Asthma    Avascular necrosis of hip, left (HCC) 07/24/2021   Closed fracture of nasal bone with routine healing 12/24/2016   Past Surgical History: Past Surgical History:  Procedure Laterality Date   APPENDECTOMY     COLON SURGERY  03/30/2015   JOINT REPLACEMENT  2008   partial right elbow; right femoral graft   Total abdominal proctocolectomy, J-pouch, ileal pouch anal anastomosis  2016   Family History: Family History  Problem Relation Age of Onset   Arthritis Mother    Anemia Mother    Diabetes Father    Hypertension Brother    Asthma Daughter    Heart disease Paternal Grandmother    Cancer Paternal Grandfather    Allergies: No Known Allergies Health Maintenance: Health Maintenance  Topic Date Due   HIV Screening  Never done   Hepatitis C Screening  Never done   COVID-19 Vaccine (1) 09/20/2021 (Originally 02/24/1983)   INFLUENZA VACCINE  02/26/2022 (Originally 06/29/2021)   TETANUS/TDAP  12/19/2026   HPV VACCINES  Aged Out    HM Colonoscopy     This patient has no relevant Health Maintenance data.      Medications: No current outpatient medications on file prior to visit.   No current facility-administered medications on file prior to visit.    Review of Systems: No headache, visual changes, nausea, vomiting, diarrhea, constipation, dizziness, abdominal pain, skin rash, fevers, chills, night sweats, swollen lymph nodes, weight loss, chest pain, body aches,  joint swelling, +muscle aches, shortness of breath, mood changes, visual or auditory hallucinations reported.  Objective:   Vitals:   09/04/21 0928  BP: 112/74  Pulse: 70  Temp: 98.4 F (36.9 C)  SpO2: 95%   Vitals:   09/04/21 0928  Weight: 179 lb (81.2 kg)  Height: 5\' 8"  (1.727 m)   Body mass index is 27.22 kg/m.  General: Well Developed, well nourished, and in no acute distress.  Neuro: Alert and oriented x3, extra-ocular muscles intact. Cranial nerves II through XII are intact, motor, sensory, and coordinative functions are all intact.  Symmetric reflexes in the bilateral lower extremities, left brachialis benign, right brachioradialis and triceps reflexes diminished.  Diminished sensation throughout the dermatomes of the right upper extremity, otherwise grossly intact. HEENT: Normocephalic, atraumatic, pupils equal round reactive to light, neck supple, no masses, no lymphadenopathy, thyroid nonpalpable. Oropharynx, nasopharynx, external ear canals are unremarkable. Skin: Warm and dry, no rashes noted.  Cardiac: Regular rate and rhythm, no murmurs rubs or gallops. No peripheral edema. Pulses symmetric. Respiratory: Clear to auscultation bilaterally. Not using accessory muscles, speaking in full sentences.  Abdominal: Soft, nontender, nondistended, positive bowel sounds, no masses, no organomegaly. Musculoskeletal: Shoulder, elbow, wrist, hip, knee, ankle stable, and with full range of motion.  Impression and Recommendations:   The patient was counselled, risk factors were discussed, and anticipatory guidance given.  Right cervical radiculopathy Patient with proximal to fingertip paresthesias, preserved strength and diminished reflexes in the setting of right elbow arthroplasty.  That being said, cervical pathology of concern, dedicated cervical spine films ordered today, will follow the results and treat accordingly.  Lumbar spondylolysis Chronic issue that is stable, has been  attending physical therapy, did not dose any prescriptions for last visit.  I did describe the nature of these medications, role in his ongoing treatment, and he is amenable to initiate duloxetine at 30 mg nightly x2 weeks, escalate to 60 mg nightly, and follow-up in 4 weeks.  He can dose Celebrex as needed, other medications have been discontinued.  Anxiety and depression Elevated PHQ and GAD again noted, essentially stable, did not have an opportunity initiate duloxetine, I have discussed the potential dual benefit of this medication in light of his current medical history and he is amenable to starting this.  We will follow this up in 4 weeks time.  Annual physical exam Annual examination completed, risk stratification labs obtained, anticipatory guidance provided, we will follow labs once resulted.  Orders & Medications Medications:  Meds ordered this encounter  Medications   DULoxetine (CYMBALTA) 30 MG capsule    Sig: Take 1 capsule (30 mg total) by mouth every evening for 14 days, THEN 2 capsules (60 mg total) every evening for 16 days.    Dispense:  46 capsule    Refill:  0   celecoxib (CELEBREX) 200 MG capsule    Sig: Take 1 capsule (200 mg total) by mouth 2 (two) times daily. One to 2 tablets by mouth daily as needed for pain.    Dispense:  60 capsule    Refill:  1   Orders Placed This Encounter  Procedures   DG Cervical Spine Complete   TSH Rfx on Abnormal to Free T4   Hepatic function panel   Lipid panel   VITAMIN D 25 Hydroxy (Vit-D Deficiency, Fractures)     Return in about 4 weeks (around 10/02/2021).    [name], MD   Primary Care Sports Medicine [facility]

## 2021-09-04 NOTE — Assessment & Plan Note (Signed)
Patient with proximal to fingertip paresthesias, preserved strength and diminished reflexes in the setting of right elbow arthroplasty.  That being said, cervical pathology of concern, dedicated cervical spine films ordered today, will follow the results and treat accordingly.

## 2021-09-04 NOTE — Assessment & Plan Note (Signed)
Chronic issue that is stable, has been attending physical therapy, did not dose any prescriptions for last visit.  I did describe the nature of these medications, role in his ongoing treatment, and he is amenable to initiate duloxetine at 30 mg nightly x2 weeks, escalate to 60 mg nightly, and follow-up in 4 weeks.  He can dose Celebrex as needed, other medications have been discontinued.

## 2021-09-04 NOTE — Assessment & Plan Note (Signed)
>>  ASSESSMENT AND PLAN FOR ANXIETY AND DEPRESSION WRITTEN ON 09/04/2021 10:52 AM BY Belita Warsame J, MD  Elevated PHQ and GAD again noted, essentially stable, did not have an opportunity initiate duloxetine , I have discussed the potential dual benefit of this medication in light of his current medical history and he is amenable to starting this.  We will follow this up in 4 weeks time.

## 2021-09-05 ENCOUNTER — Ambulatory Visit: Payer: BC Managed Care – PPO | Attending: Family Medicine

## 2021-09-05 DIAGNOSIS — M4306 Spondylolysis, lumbar region: Secondary | ICD-10-CM | POA: Diagnosis not present

## 2021-09-05 DIAGNOSIS — G8929 Other chronic pain: Secondary | ICD-10-CM | POA: Insufficient documentation

## 2021-09-05 DIAGNOSIS — M5386 Other specified dorsopathies, lumbar region: Secondary | ICD-10-CM | POA: Insufficient documentation

## 2021-09-05 DIAGNOSIS — M87052 Idiopathic aseptic necrosis of left femur: Secondary | ICD-10-CM | POA: Diagnosis present

## 2021-09-05 DIAGNOSIS — M545 Low back pain, unspecified: Secondary | ICD-10-CM | POA: Diagnosis present

## 2021-09-05 LAB — HEPATIC FUNCTION PANEL
ALT: 24 IU/L (ref 0–44)
AST: 24 IU/L (ref 0–40)
Albumin: 4.6 g/dL (ref 4.0–5.0)
Alkaline Phosphatase: 77 IU/L (ref 44–121)
Bilirubin Total: 0.4 mg/dL (ref 0.0–1.2)
Bilirubin, Direct: 0.11 mg/dL (ref 0.00–0.40)
Total Protein: 7.2 g/dL (ref 6.0–8.5)

## 2021-09-05 LAB — LIPID PANEL
Chol/HDL Ratio: 5 ratio (ref 0.0–5.0)
Cholesterol, Total: 219 mg/dL — ABNORMAL HIGH (ref 100–199)
HDL: 44 mg/dL (ref >39)
LDL Chol Calc (NIH): 135 mg/dL — ABNORMAL HIGH (ref 0–99)
Triglycerides: 223 mg/dL — ABNORMAL HIGH (ref 0–149)
VLDL Cholesterol Cal: 40 mg/dL (ref 5–40)

## 2021-09-05 LAB — VITAMIN D 25 HYDROXY (VIT D DEFICIENCY, FRACTURES): Vit D, 25-Hydroxy: 9.8 ng/mL — ABNORMAL LOW (ref 30.0–100.0)

## 2021-09-05 LAB — TSH RFX ON ABNORMAL TO FREE T4: TSH: 1.85 u[IU]/mL (ref 0.450–4.500)

## 2021-09-05 NOTE — Therapy (Signed)
[facility] [address] [place], [place], [address] Phone: [phone]   Fax:  [phone]  Physical Therapy Treatment  Patient Details  Name: Harry Williams MRN: [id number] Date of Birth: [birth year] Referring Provider (PT): [name], MD   Encounter Date: 09/05/2021   PT End of Session - 09/06/21 2332     Visit Number 4    Number of Visits 13    Date for PT Re-Evaluation 10/03/21    Authorization Type BCBS COMM PPO    PT Start Time 0913    PT Stop Time 0950    PT Time Calculation (min) 37 min    Activity Tolerance Patient tolerated treatment well    Behavior During Therapy [facility] for tasks assessed/performed             Past Medical History:  Diagnosis Date   Asthma    Avascular necrosis of hip, left (HCC) 07/24/2021   Closed fracture of nasal bone with routine healing 12/24/2016    Past Surgical History:  Procedure Laterality Date   APPENDECTOMY     COLON SURGERY  03/30/2015   JOINT REPLACEMENT  2008   partial right elbow; right femoral graft   Total abdominal proctocolectomy, J-pouch, ileal pouch anal anastomosis  2016    There were no vitals filed for this visit.   Subjective Assessment - 09/06/21 2357     Subjective Pt reports overall he is finding the PT to helpful, that he is feeling looser. Pt states his low back pain is normally 3/10, but yesterday for an unknown reason it started hurting more. On reflecting about his sitting tolerancec driving, pt  reports he is able to drive for about 20 mins before his low back starts to bother him. Pt notes he is completing his HEP daily.    How long can you sit comfortably? 20 mins for driving    Currently in Pain? Yes    Pain Score 5     Pain Location Back    Pain Orientation Left    Pain Descriptors / Indicators Aching;Throbbing    Pain Type Chronic pain    Pain Onset More than a month ago    Pain Frequency Constant                                          PT Education - 09/06/21 2331     Education Details Updated HEP    Person(s) Educated Patient    Methods Explanation;Demonstration;Tactile cues;Verbal cues    Comprehension Verbalized understanding;Returned demonstration;Verbal cues required;Tactile cues required              PT Short Term Goals - 09/06/21 2334       PT SHORT TERM GOAL #1   Title Pt will be ind in an initial HEP.    Status Achieved    Target Date 09/05/21               PT Long Term Goals - 08/15/21 1228       PT LONG TERM GOAL #1   Title Pt will b Ind in a final HEP to maintain achieved LOF    Status New    Target Date 10/03/21      PT LONG TERM GOAL #2   Title Increase bilat hamstring flexibility to 55d to minimize low back strain    Baseline 45d  Status New    Target Date 10/03/21      PT LONG TERM GOAL #3   Title increase trunk ROM to WNLs from minimal limitations to minimize low back strain    Status New    Target Date 10/03/21      PT LONG TERM GOAL #4   Title Decrease pt's low back pain to 0-4/10 pain range for improved quality of sleep and improved tolarance for sitting and phyical activity    Baseline 3-10/10    Status New    Target Date 10/03/21             Therapeutic Ex - LE X over 10x ea - PPT - 5'' hold - 10x - Marching c PPT- 2x10 - SKTC 2x 45'' ea - DKTC with gentle rocking- 2x 45'' - LTR 5x, 10 sec - bridge - 2x10 10'' - supine clam - GTB - alternating - 2x20 - hip adduction squeeze - 5'' 2x10 - bird dog - 2x10 5'' hold - partial ROM child's pose, forward and laterally to the R- 30'' x2  Manual Therapy - Grade 3 UPAs were applied L1-L4 to each side      Plan - 09/06/21 2334     Clinical Impression Statement Overall, pt reports improvement pain and flexibility, hoever since yesterday his L low back has been hurting more and pt is no able to recall a reason for this increase. PT was continued for  lumbopelvic flexibility and strengthenig/stability. In addition to ther ex for flexibility, UPA glides were completed for L1-L4. For stability an abdominal strengthneing exs was added. Following the session today, Pt reports his L low back pain had decreased to 2/10.    Personal Factors and Comorbidities Comorbidity 1;Time since onset of injury/illness/exacerbation    Examination-Activity Limitations Sleep;Sit    Examination-Participation Restrictions Occupation    Stability/Clinical Decision Making Stable/Uncomplicated    Clinical Decision Making Low    Rehab Potential Good    PT Frequency 2x / week    PT Duration 6 weeks    PT Treatment/Interventions ADLs/Self Care Home Management;Aquatic Therapy;Cryotherapy;Electrical Stimulation;Ultrasound;Traction;Moist Heat;Iontophoresis 4mg /ml Dexamethasone;Therapeutic exercise;Therapeutic activities;Manual techniques;Patient/family education;Passive range of motion;Dry needling;Taping;Spinal Manipulations    PT Next Visit Plan Assess repsonse to HEP and education. Assess hip flexor-Thomas test (quad tightness present). Progress ther ex as indicated.    PT Home Exercise Plan [facility]    Consulted and Agree with Plan of Care Patient             Patient will benefit from skilled therapeutic intervention in order to improve the following deficits and impairments:  Decreased range of motion, Pain, Decreased activity tolerance, Impaired flexibility, Decreased strength  Visit Diagnosis: Lumbar spondylolysis  Avascular necrosis of hip, left (HCC)  Decreased ROM of lumbar spine  Chronic midline low back pain, unspecified whether sciatica present     Problem List Patient Active Problem List   Diagnosis Date Noted   Annual physical exam 09/04/2021   Right cervical radiculopathy 09/04/2021   Lumbar spondylolysis 07/24/2021   Anxiety and depression 07/24/2021   Chronic low back pain 06/04/2016   Paraspinal muscle spasm 02/17/2016   Adenomatous  polyp 04/04/2015    Joellyn Rued MS, PT 09/06/21 11:58 PM   [facility] Out[facility] Rehabilitation [facility] [address] [place], [place], [address] Phone: [phone]   Fax:  [phone]  Name: Harry Williams MRN: [id number] Date of Birth: 27-Sep-1981

## 2021-09-07 ENCOUNTER — Other Ambulatory Visit: Payer: Self-pay | Admitting: Family Medicine

## 2021-09-07 ENCOUNTER — Encounter: Payer: Self-pay | Admitting: Family Medicine

## 2021-09-07 DIAGNOSIS — R7989 Other specified abnormal findings of blood chemistry: Secondary | ICD-10-CM

## 2021-09-07 MED ORDER — VITAMIN D (ERGOCALCIFEROL) 1.25 MG (50000 UNIT) PO CAPS: 50000.0000 [IU] | ORAL_CAPSULE | ORAL | 0 refills | Status: DC

## 2021-09-11 ENCOUNTER — Ambulatory Visit: Payer: BC Managed Care – PPO | Admitting: Physical Therapy

## 2021-09-11 ENCOUNTER — Encounter: Payer: Self-pay | Admitting: Physical Therapy

## 2021-09-11 ENCOUNTER — Other Ambulatory Visit: Payer: Self-pay

## 2021-09-11 DIAGNOSIS — M87052 Idiopathic aseptic necrosis of left femur: Secondary | ICD-10-CM

## 2021-09-11 DIAGNOSIS — M4306 Spondylolysis, lumbar region: Secondary | ICD-10-CM

## 2021-09-11 DIAGNOSIS — G8929 Other chronic pain: Secondary | ICD-10-CM

## 2021-09-11 DIAGNOSIS — M5386 Other specified dorsopathies, lumbar region: Secondary | ICD-10-CM

## 2021-09-11 NOTE — Therapy (Signed)
[facility] [address] [place], [place], [address] Phone: [phone]   Fax:  [phone]  Physical Therapy Treatment  Patient Details  Name: Harry Williams MRN: [id number] Date of Birth: [birth year] Referring Provider (PT): [name], MD   Encounter Date: 09/11/2021   PT End of Session - 09/11/21 0916     Visit Number 5    Number of Visits 13    Date for PT Re-Evaluation 10/03/21    Authorization Type BCBS COMM PPO    PT Start Time 0915    PT Stop Time 1000    PT Time Calculation (min) 45 min    Activity Tolerance Patient tolerated treatment Williams    Behavior During Therapy [facility] for tasks assessed/performed             Past Medical History:  Diagnosis Date   Asthma    Avascular necrosis of hip, left (HCC) 07/24/2021   Closed fracture of nasal bone with routine healing 12/24/2016    Past Surgical History:  Procedure Laterality Date   APPENDECTOMY     COLON SURGERY  03/30/2015   JOINT REPLACEMENT  2008   partial right elbow; right femoral graft   Total abdominal proctocolectomy, J-pouch, ileal pouch anal anastomosis  2016    There were no vitals filed for this visit.   Subjective Assessment - 09/11/21 0919     Subjective Pt feels that PT has helped him feel looser.  He feels that the PAs last session were helpful. His LBP is 3/10 today with no radiatoin to LE.  Aggs: sleeping, movment Eases:  Fetal position    How long can you sit comfortably? up to 3 hours    How long can you stand comfortably? unsure    How long can you walk comfortably? unsure    Diagnostic tests xrays have been taken as per pt    Patient Stated Goals To strengthening the core and to decrease and/or manage the pain    Pain Onset More than a month ago             [facility] Adult PT Treatment/Exercise:   Therapeutic Exercise:   - nu-step L7 66m while taking subjective and planning session with patient - LE X over 10x ea - PPT -  5'' hold - 10x - childs pose to cobra - x10 - D/L - 8'' box - 15# KB - x12 - bird dog - x10 10'' hold - pallof press 2x10 Blue TB - 2x10 ea - Up chop - 10x ea - 13# - Dwn chop - 10x ea - 10#  Manual therapy, concentrating on increasing extensibility of restricted tissue to reduce discomfort and improve mechanics in functional movement:  - UPA and CPA lumbar spine, pt in prone, GIII-IV   PT Short Term Goals - 09/06/21 2334       PT SHORT TERM GOAL #1   Title Pt will be ind in an initial HEP.    Status Achieved    Target Date 09/05/21               PT Long Term Goals - 08/15/21 1228       PT LONG TERM GOAL #1   Title Pt will b Ind in a final HEP to maintain achieved LOF    Status New    Target Date 10/03/21      PT LONG TERM GOAL #2   Title Increase bilat hamstring flexibility to 55d to minimize low  back strain    Baseline 45d    Status New    Target Date 10/03/21      PT LONG TERM GOAL #3   Title increase trunk ROM to WNLs from minimal limitations to minimize low back strain    Status New    Target Date 10/03/21      PT LONG TERM GOAL #4   Title Decrease pt's low back pain to 0-4/10 pain range for improved quality of sleep and improved tolarance for sitting and phyical activity    Baseline 3-10/10    Status New    Target Date 10/03/21                   Plan - 09/11/21 0957     Clinical Impression Statement Pt reports mild pain reduction following therapy  HEP was reviewed, but left unchanged    Overall, Harry Williams is progressing Williams with therapy.  Today we concentrated on core strengthening, hip strengthening, and lumbar mobility.  Pt is hypomobile L4-L5 with PA.  Pt reports improvement in pain following MT.  Harry Williams with introduction of hip hinge, needing only minor cuing to maintain proper form.  Pt will continue to benefit from skilled physical therapy to address remaining deficits and achieve listed goals.  Continue per POC.     Personal Factors and Comorbidities Comorbidity 1;Time since onset of injury/illness/exacerbation    Examination-Activity Limitations Sleep;Sit    Examination-Participation Restrictions Occupation    Stability/Clinical Decision Making Stable/Uncomplicated    Rehab Potential Good    PT Frequency 2x / week    PT Duration 6 weeks    PT Treatment/Interventions ADLs/Self Care Home Management;Aquatic Therapy;Cryotherapy;Electrical Stimulation;Ultrasound;Traction;Moist Heat;Iontophoresis 4mg /ml Dexamethasone;Therapeutic exercise;Therapeutic activities;Manual techniques;Patient/family education;Passive range of motion;Dry needling;Taping;Spinal Manipulations    PT Next Visit Plan Assess repsonse to HEP and education. Assess hip flexor-Thomas test (quad tightness present). Progress ther ex as indicated.    PT Home Exercise Plan Harry Williams    Consulted and Agree with Plan of Care Patient             Patient will benefit from skilled therapeutic intervention in order to improve the following deficits and impairments:  Decreased range of motion, Pain, Decreased activity tolerance, Impaired flexibility, Decreased strength  Visit Diagnosis: Lumbar spondylolysis  Avascular necrosis of hip, left (HCC)  Decreased ROM of lumbar spine  Chronic midline low back pain, unspecified whether sciatica present     Problem List Patient Active Problem List   Diagnosis Date Noted   Annual physical exam 09/04/2021   Right cervical radiculopathy 09/04/2021   Lumbar spondylolysis 07/24/2021   Anxiety and depression 07/24/2021   Chronic low back pain 06/04/2016   Paraspinal muscle spasm 02/17/2016   Adenomatous polyp 04/04/2015    Harry Williams, PT 09/11/2021, 9:59 AM  [facility] [address] [place], [place], [address] Phone: [phone]   Fax:  [phone]  Name: Harry Williams MRN: [id number] Date of Birth: [birth year]

## 2021-09-18 ENCOUNTER — Other Ambulatory Visit: Payer: Self-pay

## 2021-09-18 ENCOUNTER — Ambulatory Visit: Payer: BC Managed Care – PPO

## 2021-09-18 DIAGNOSIS — M87052 Idiopathic aseptic necrosis of left femur: Secondary | ICD-10-CM

## 2021-09-18 DIAGNOSIS — M5386 Other specified dorsopathies, lumbar region: Secondary | ICD-10-CM

## 2021-09-18 DIAGNOSIS — M4306 Spondylolysis, lumbar region: Secondary | ICD-10-CM | POA: Diagnosis not present

## 2021-09-18 DIAGNOSIS — G8929 Other chronic pain: Secondary | ICD-10-CM

## 2021-09-18 NOTE — Therapy (Signed)
LONG TERM GOAL #1   Title Pt will b Ind in a final HEP to maintain achieved LOF    Status New    Target Date 10/03/21      PT LONG TERM GOAL #2   Title Increase bilat hamstring flexibility to 55d to minimize low back strain    Baseline 45d    Status New    Target Date 10/03/21      PT LONG TERM GOAL #3   Title increase trunk ROM to WNLs from minimal limitations to minimize low back strain    Status New    Target Date 10/03/21      PT LONG TERM GOAL #4   Title Decrease pt's low back pain to 0-4/10 pain range for improved quality of sleep and improved tolarance for sitting and phyical activity    Baseline 3-10/10    Status New    Target Date 10/03/21                    Plan - 09/18/21 1014     Clinical Impression Statement PT continues to be provided for core and hip strengthening, and lumbar mobility. Pt reports overall improvement with his back, but the back pain remains at a low level. Pt tolerated today's session with greater physical demand without adverse effects. Re-assess pt the next PT session for DC vs. continuation.    Personal Factors and Comorbidities Comorbidity 1;Time  since onset of injury/illness/exacerbation    Examination-Activity Limitations Sleep;Sit    Examination-Participation Restrictions Occupation    Stability/Clinical Decision Making Stable/Uncomplicated    Clinical Decision Making Low    Rehab Potential Good    PT Frequency 2x / week    PT Duration 6 weeks    PT Treatment/Interventions ADLs/Self Care Home Management;Aquatic Therapy;Cryotherapy;Electrical Stimulation;Ultrasound;Traction;Moist Heat;Iontophoresis 4mg /ml Dexamethasone;Therapeutic exercise;Therapeutic activities;Manual techniques;Patient/family education;Passive range of motion;Dry needling;Taping;Spinal Manipulations    PT Next Visit Plan Assess repsonse to HEP and education. Assess hip flexor-Thomas test (quad tightness present). Progress ther ex as indicated.    PT Home Exercise Plan [facility]    Consulted and Agree with Plan of Care Patient             Patient will benefit from skilled therapeutic intervention in order to improve the following deficits and impairments:  Decreased range of motion, Pain, Decreased activity tolerance, Impaired flexibility, Decreased strength  Visit Diagnosis: Lumbar spondylolysis  Avascular necrosis of hip, left (HCC)  Chronic midline low back pain, unspecified whether sciatica present  Decreased ROM of lumbar spine     Problem List Patient Active Problem List   Diagnosis Date Noted   Annual physical exam 09/04/2021   Right cervical radiculopathy 09/04/2021   Lumbar spondylolysis 07/24/2021   Anxiety and depression 07/24/2021   Chronic low back pain 06/04/2016   Paraspinal muscle spasm 02/17/2016   Adenomatous polyp 04/04/2015    Gar Ponto MS, PT 09/18/21 10:47 AM   [facility] [address] [place], [place], [address] Phone: [phone]   Fax:  [phone]  Name: Harry Williams MRN: [id number] Date of Birth: 1980/12/19  [place], [place], [address] Phone: [phone]   Fax:  [phone]  Physical Therapy Treatment  Patient Details  Name: Harry Williams MRN: [id number] Date of Birth: 07-19-1981 Referring Provider (PT): Montel Culver, MD   Encounter Date: 09/18/2021   PT End of Session - 09/18/21 0953     Visit Number 6    Number of Visits 13    Date for PT Re-Evaluation 10/03/21    Authorization Type BCBS COMM PPO    PT Start Time 0937    PT Stop Time 1019    PT Time Calculation (min) 42 min    Activity Tolerance Patient tolerated treatment well    Behavior During Therapy [facility] for tasks assessed/performed             Past Medical History:  Diagnosis Date   Asthma    Avascular necrosis of hip, left ([place]) 07/24/2021   Closed fracture of nasal bone with routine healing 12/24/2016    Past Surgical History:  Procedure Laterality Date   APPENDECTOMY     COLON SURGERY  03/30/2015   JOINT REPLACEMENT  2008   partial right elbow; right femoral graft   Total abdominal proctocolectomy, J-pouch, ileal pouch anal anastomosis  2016    There were no vitals filed for this visit.    Subjective Assessment - 09/18/21 0943     Subjective Pt reports his low back is feeling better and more loose.    Patient Stated Goals To strengthening the core and to decrease and/or manage the pain    Currently in Pain? Yes    Pain Score 3     Pain Location Back    Pain Orientation Left    Pain Descriptors / Indicators Aching    Pain Type Chronic pain    Pain Radiating Towards No issue    Pain Onset More than a month ago    Pain Frequency Constant    Aggravating Factors  Trying to balance on 1 leg putting on clothes, driving work trunk    Pain Relieving Factors exercises, meds                            Objective measurements completed on examination: See above findings.       [facility] Adult [facility]  Treatment/Exercise:   Therapeutic Exercise:   - nu-step L7 22m, Ues/LEs while taking subjective and planning session with patient - LE X over 10x ea - PPT - 5'' hold - 10x - childs pose to cobra - x10 - D/L - 8'' box - 15# KB - 2x10 - bird dog - x10 10'' hold - pallof press 2x10 Blue TB - 2x10 ea - Up chop - 10x ea - 13# - Dwn chop - 10x ea - 10#   Manual therapy, concentrating on increasing extensibility of restricted tissue to reduce discomfort and improve mechanics in functional movement:   - UPA and CPA lumbar spine, pt in prone, GIII-IV            PT Short Term Goals - 09/06/21 2334       PT SHORT TERM GOAL #1   Title Pt will be ind in an initial HEP.    Status Achieved    Target Date 09/05/21               PT Long Term Goals - 08/15/21 1228       PT

## 2021-09-25 ENCOUNTER — Ambulatory Visit: Payer: BC Managed Care – PPO | Admitting: Physical Therapy

## 2021-09-25 ENCOUNTER — Other Ambulatory Visit: Payer: Self-pay

## 2021-09-25 ENCOUNTER — Encounter: Payer: Self-pay | Admitting: Physical Therapy

## 2021-09-25 DIAGNOSIS — M545 Low back pain, unspecified: Secondary | ICD-10-CM

## 2021-09-25 DIAGNOSIS — M4306 Spondylolysis, lumbar region: Secondary | ICD-10-CM

## 2021-09-25 DIAGNOSIS — G8929 Other chronic pain: Secondary | ICD-10-CM

## 2021-09-25 DIAGNOSIS — M87052 Idiopathic aseptic necrosis of left femur: Secondary | ICD-10-CM

## 2021-09-25 DIAGNOSIS — M5386 Other specified dorsopathies, lumbar region: Secondary | ICD-10-CM

## 2021-09-25 NOTE — Therapy (Signed)
PT LONG TERM GOAL #1   Title Pt will b Ind in a final HEP to maintain achieved LOF    Baseline 10/28: MET    Status Achieved      PT LONG TERM GOAL #2   Title Increase bilat hamstring flexibility to 55d to minimize low back strain    Baseline 45d 10/28:  L:77 R 70    Status Achieved      PT LONG TERM GOAL #3   Title increase trunk ROM to WNLs from minimal limitations to minimize low back strain    Baseline 10/28: MET    Status Achieved      PT LONG TERM GOAL #4   Title Decrease pt's low back pain to 0-4/10 pain range for improved quality of sleep and improved tolarance for sitting and phyical activity    Baseline 3-10/10 10/28: MET    Status Achieved                   Plan - 09/25/21 0933     Clinical Impression Statement Warnie Belair has progressed well with therapy.  Improved impairments include: HS ROM, lumbar ROM, core strength.  Functional improvements include: transfers.  Progressions needed include: continued work on core strength at home via [place].  Barriers to progress include: NONE.  Please see  baseline and/or status section in "Goals" for specific progress on short term and long term goals established at evaluation.  I recommend D/C home with HEP; pt agrees with plan.    Personal Factors and Comorbidities Comorbidity 1;Time since onset of injury/illness/exacerbation    Examination-Activity Limitations Sleep;Sit    Examination-Participation Restrictions Occupation    Stability/Clinical Decision Making Stable/Uncomplicated    Rehab Potential Good    PT Frequency 2x / week    PT Duration 6 weeks    PT Treatment/Interventions ADLs/Self Care Home Management;Aquatic Therapy;Cryotherapy;Electrical Stimulation;Ultrasound;Traction;Moist Heat;Iontophoresis 65m/ml Dexamethasone;Therapeutic exercise;Therapeutic activities;Manual techniques;Patient/family education;Passive range of motion;Dry needling;Taping;Spinal Manipulations    PT Next Visit Plan Assess repsonse to HEP and education. Assess hip flexor-Thomas test (quad tightness present). Progress ther ex as indicated.    PT [facility]   Consulted and Agree with Plan of Care Patient             Patient will benefit from skilled therapeutic intervention in order to improve the following deficits and impairments:  Decreased range of motion, Pain, Decreased activity tolerance, Impaired flexibility, Decreased strength  Visit Diagnosis: Lumbar spondylolysis  Avascular necrosis of hip, left (HCC)  Chronic midline low back pain, unspecified whether sciatica present  Decreased ROM of lumbar spine     Problem List Patient Active Problem List   Diagnosis Date Noted   Annual physical exam 09/04/2021   Right cervical radiculopathy 09/04/2021   Lumbar spondylolysis 07/24/2021   Anxiety and depression 07/24/2021   Chronic low back pain 06/04/2016   Paraspinal muscle spasm 02/17/2016   Adenomatous polyp 04/04/2015    KMathis Dad PT 09/25/2021, 9:55 AM  [facility] [place] [address]Phone: [phone]  Fax:  [phone] Name: TRiely BaskettMRN: [id number] of Birth: 905-Feb-1982  [place], [place], [address] Phone: [phone]   Fax:  [phone]    PHYSICAL THERAPY DISCHARGE SUMMARY  Visits from Start of Care: 7  Current functional level related to goals / functional outcomes: See assessment/goals   Remaining deficits: See assessment/goals   Education / Equipment: HEP and D/C plans  Patient agrees to discharge. Patient goals were met. Patient is being discharged due to meeting the stated rehab goals.   Patient Details  Name: Darran Gabay MRN: [id number] Date of Birth: Mar 23, 1981 Referring Provider (PT): Montel Culver, MD   Encounter Date: 09/25/2021   PT End of Session - 09/25/21 0915     Visit Number 7    Number of Visits 13    Date for PT Re-Evaluation 10/03/21    Authorization Type BCBS COMM PPO    PT Start Time 0915    PT Stop Time 0950    PT Time Calculation (min) 35 min    Activity Tolerance Patient tolerated treatment well    Behavior During Therapy [facility] for tasks assessed/performed             Past Medical History:  Diagnosis Date   Asthma    Avascular necrosis of hip, left ([place]) 07/24/2021   Closed fracture of nasal bone with routine healing 12/24/2016    Past Surgical History:  Procedure Laterality Date   APPENDECTOMY     COLON SURGERY  03/30/2015   JOINT REPLACEMENT  2008   partial right elbow; right femoral graft   Total abdominal proctocolectomy, J-pouch, ileal pouch anal anastomosis  2016    There were no vitals filed for this visit.   Subjective Assessment - 09/25/21 0915     Subjective Pt reports that he had a flare up this week about 3 days ago for an unknown reason.  This has since calmed down. His LBP is 3/10 today with no radiatoin to LE.  Aggs: sleeping, movment Eases:  Fetal position    How long can you sit comfortably? up to 3 hours    How long can you stand comfortably? unsure    How long can you walk comfortably? unsure     Diagnostic tests xrays have been taken as per pt    Patient Stated Goals To strengthening the core and to decrease and/or manage the pain    Pain Onset More than a month ago             Lumbar ROM WNL  HS length: L:77 R 70   [facility] Adult PT Treatment/Exercise:   Therapeutic Exercise:   - nu-step L7 3mwhile taking subjective and planning session with patient - LTR - 20x - SKTC - 2x ea 45'' - DKTC - 2x 45'' - Bridge 10'' x10 - abdominal squeeze with swiss ball - 5'' x10 - side plank from knees - 5'' x5 ea - bird dog - 10x 10'' - dead bug with ball - 10x     PT Education - 09/25/21 0923     Education Details HEP             PT Short Term Goals - 09/06/21 2334       PT SHORT TERM GOAL #1   Title Pt will be ind in an initial HEP.    Status Achieved    Target Date 09/05/21               PT Long Term Goals - 09/25/21 [phone]

## 2021-09-25 NOTE — Patient Instructions (Addendum)
Access Code: [facility] URL: [email or web address] Date: 09/25/2021 Prepared by: Shearon Balo  Exercises Supine Posterior Pelvic Tilt - 2 x daily - 7 x weekly - 1-2 sets - 10 reps - 3 hold Hooklying Single Knee to Chest Stretch - 2 x daily - 7 x weekly - 1 sets - 3 reps - 20 hold Supine Double Knee to Chest - 2 x daily - 7 x weekly - 1 sets - 10 reps - 20 hold Supine Lower Trunk Rotation - 1 x daily - 7 x weekly - 2 sets - 5 reps - 10 hold Supine Bridge - 2 x daily - 7 x weekly - 1-2 sets - 10 reps - 10 hold Dead Bug with Swiss Ball - 1 x daily - 7 x weekly - 3 sets - 10 reps Side Plank on Knees - 1 x daily - 7 x weekly - 1 sets - 3 reps - 10'' hold Bird Dog - 1 x daily - 7 x weekly - 3 sets - 10 reps - 5 hold

## 2021-10-02 ENCOUNTER — Other Ambulatory Visit: Payer: Self-pay

## 2021-10-02 ENCOUNTER — Encounter: Payer: Self-pay | Admitting: Family Medicine

## 2021-10-02 ENCOUNTER — Ambulatory Visit (INDEPENDENT_AMBULATORY_CARE_PROVIDER_SITE_OTHER): Payer: BC Managed Care – PPO | Admitting: Family Medicine

## 2021-10-02 VITALS — BP 104/70 | HR 67 | Ht 68.0 in | Wt 179.0 lb

## 2021-10-02 DIAGNOSIS — M5412 Radiculopathy, cervical region: Secondary | ICD-10-CM | POA: Diagnosis not present

## 2021-10-02 DIAGNOSIS — M87052 Idiopathic aseptic necrosis of left femur: Secondary | ICD-10-CM | POA: Diagnosis not present

## 2021-10-02 DIAGNOSIS — M4306 Spondylolysis, lumbar region: Secondary | ICD-10-CM

## 2021-10-02 DIAGNOSIS — M47812 Spondylosis without myelopathy or radiculopathy, cervical region: Secondary | ICD-10-CM | POA: Diagnosis not present

## 2021-10-02 MED ORDER — DULOXETINE HCL 60 MG PO CPEP: 60.0000 mg | ORAL_CAPSULE | Freq: Every evening | ORAL | 0 refills | Status: DC

## 2021-10-02 NOTE — Progress Notes (Signed)
Primary Care / Sports Medicine Office Visit  Patient Information:  Patient ID: Harry Williams, male DOB: 26-Jun-1981 Age: 40 y.o. MRN: [id number]   Harry Williams is a pleasant 40 y.o. male presenting with the following:  Chief Complaint  Patient presents with   Follow-up    Review of Systems pertinent details above   Patient Active Problem List   Diagnosis Date Noted   Annual physical exam 09/04/2021   Right cervical radiculopathy 09/04/2021   Lumbar spondylolysis 07/24/2021   Anxiety and depression 07/24/2021   Adenomatous polyp 04/04/2015   Past Medical History:  Diagnosis Date   Asthma    Avascular necrosis of hip, left (HCC) 07/24/2021   Closed fracture of nasal bone with routine healing 12/24/2016   Outpatient Encounter Medications as of 10/02/2021  Medication Sig   celecoxib (CELEBREX) 200 MG capsule Take 1 capsule (200 mg total) by mouth 2 (two) times daily. One to 2 tablets by mouth daily as needed for pain.   Vitamin D, Ergocalciferol, (DRISDOL) 1.25 MG (50000 UNIT) CAPS capsule Take 1 capsule (50,000 Units total) by mouth every 7 (seven) days. Take for 8 total doses(weeks)   [DISCONTINUED] DULoxetine (CYMBALTA) 30 MG capsule Take 1 capsule (30 mg total) by mouth every evening for 14 days, THEN 2 capsules (60 mg total) every evening for 16 days.   DULoxetine (CYMBALTA) 60 MG capsule Take 1 capsule (60 mg total) by mouth every evening.   No facility-administered encounter medications on file as of 10/02/2021.   Past Surgical History:  Procedure Laterality Date   APPENDECTOMY     COLON SURGERY  03/30/2015   JOINT REPLACEMENT  2008   partial right elbow; right femoral graft   Total abdominal proctocolectomy, J-pouch, ileal pouch anal anastomosis  2016    Vitals:   10/02/21 0930  BP: 104/70  Pulse: 67  SpO2: 98%   Vitals:   10/02/21 0930  Weight: 179 lb (81.2 kg)  Height: 5\' 8"  (1.727 m)   Body mass index is 27.22 kg/m.  DG Cervical Spine  Complete  Result Date: 09/07/2021 CLINICAL DATA:  Right-sided neck pain for 20 years, worsening over last 2 months EXAM: CERVICAL SPINE - COMPLETE 4+ VIEW COMPARISON:  None. FINDINGS: Frontal, bilateral oblique, and lateral views of the cervical spine are obtained. There is slight reversal cervical lordosis likely due to prominent spondylosis at C4-5 and C5-6. There is right-sided neural foraminal narrowing due to uncovertebral hypertrophy at C3-4 and C4-5. Remaining neural foramina are patent. No acute fractures. Prevertebral soft tissues are unremarkable. Lung apices are clear. IMPRESSION: 1. Multilevel cervical spondylosis, with right-sided neural foraminal encroachment at C3-4 and C4-5. 2. No acute fracture. Electronically Signed   By: Sharlet Salina M.D.   On: 09/07/2021 19:47     Independent interpretation of notes and tests performed by another provider:   Independent interpretation of cervical spine x-ray dated 09/07/2021 reveal straightening of the expected cervical lordosis, there is subtle anterior endplate osteophyte presence at C5-C6, right-sided foraminal narrowing at C3-4 and C4-5, no acute osseous processes identified  Procedures performed:   None  Pertinent History, Exam, Impression, and Recommendations:   Right cervical radiculopathy Patient states that cervical and lumbar pain have improved, denies consistent radicular features in the right upper extremity, has recently completed physical therapy for his lumbar spine, and dose duloxetine 30 mg daily.  He did not titrate the dose of as of yet but has tolerated the medication well.  He has noted initial  increased yawning which has since resolved.  We reviewed the patient's x-rays in relation to his stated symptomatology, at this stage she is amenable to increase in his Cymbalta to 60 mg daily, start of formal physical therapy for cervical spine and continued home exercises for his lumbar spine as he is noted improvement, and he can  dose Celebrex on a as needed basis.  We will coordinate follow-up in 6 weeks to assess response, and he was advised to contact us for any questions between now and then.  If suboptimal progress noted, advanced imaging to be considered as can modification/escalation of pharmacotherapy.  If improved/improving, transition to fully home-based maintenance rehab, can discuss continuation of duloxetine versus gradual wean.  Lumbar spondylolysis See additional assessment(s) for plan details.   Orders & Medications Meds ordered this encounter  Medications   DULoxetine (CYMBALTA) 60 MG capsule    Sig: Take 1 capsule (60 mg total) by mouth every evening.    Dispense:  90 capsule    Refill:  0   Orders Placed This Encounter  Procedures   Ambulatory referral to Physical Therapy     Return in about 6 weeks (around 11/13/2021).     [name], MD   Primary Care Sports Medicine [facility]

## 2021-10-02 NOTE — Assessment & Plan Note (Signed)
>>  ASSESSMENT AND PLAN FOR LUMBAR SPONDYLOLYSIS WRITTEN ON 10/02/2021 12:07 PM BY Anyia Gierke, [name], MD  See additional assessment(s) for plan details.

## 2021-10-02 NOTE — Assessment & Plan Note (Signed)
See additional assessment(s) for plan details. 

## 2021-10-02 NOTE — Patient Instructions (Signed)
-   Start new dose of Cymbalta (60 mg) - Referral to physical therapy for the next, continue home exercises for the low back - Return for follow-up in 6 weeks, contact our office for any questions between now and then

## 2021-10-02 NOTE — Assessment & Plan Note (Signed)
Patient states that cervical and lumbar pain have improved, denies consistent radicular features in the right upper extremity, has recently completed physical therapy for his lumbar spine, and dose duloxetine 30 mg daily.  He did not titrate the dose of as of yet but has tolerated the medication well.  He has noted initial increased yawning which has since resolved.  We reviewed the patient's x-rays in relation to his stated symptomatology, at this stage she is amenable to increase in his Cymbalta to 60 mg daily, start of formal physical therapy for cervical spine and continued home exercises for his lumbar spine as he is noted improvement, and he can dose Celebrex on a as needed basis.  We will coordinate follow-up in 6 weeks to assess response, and he was advised to contact us for any questions between now and then.  If suboptimal progress noted, advanced imaging to be considered as can modification/escalation of pharmacotherapy.  If improved/improving, transition to fully home-based maintenance rehab, can discuss continuation of duloxetine versus gradual wean.
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
## 2021-11-06 ENCOUNTER — Ambulatory Visit: Payer: BC Managed Care – PPO | Attending: Family Medicine

## 2021-11-06 ENCOUNTER — Other Ambulatory Visit: Payer: Self-pay

## 2021-11-06 DIAGNOSIS — R293 Abnormal posture: Secondary | ICD-10-CM | POA: Diagnosis present

## 2021-11-06 DIAGNOSIS — M4306 Spondylolysis, lumbar region: Secondary | ICD-10-CM | POA: Insufficient documentation

## 2021-11-06 DIAGNOSIS — M47812 Spondylosis without myelopathy or radiculopathy, cervical region: Secondary | ICD-10-CM | POA: Insufficient documentation

## 2021-11-06 DIAGNOSIS — R29898 Other symptoms and signs involving the musculoskeletal system: Secondary | ICD-10-CM | POA: Diagnosis present

## 2021-11-06 DIAGNOSIS — M6289 Other specified disorders of muscle: Secondary | ICD-10-CM | POA: Insufficient documentation

## 2021-11-13 ENCOUNTER — Ambulatory Visit (INDEPENDENT_AMBULATORY_CARE_PROVIDER_SITE_OTHER): Payer: BC Managed Care – PPO | Admitting: Family Medicine

## 2021-11-13 ENCOUNTER — Other Ambulatory Visit: Payer: Self-pay

## 2021-11-13 ENCOUNTER — Encounter: Payer: Self-pay | Admitting: Family Medicine

## 2021-11-13 VITALS — BP 106/78 | HR 83 | Ht 69.0 in | Wt 181.0 lb

## 2021-11-13 DIAGNOSIS — M47812 Spondylosis without myelopathy or radiculopathy, cervical region: Secondary | ICD-10-CM

## 2021-11-13 DIAGNOSIS — M4306 Spondylolysis, lumbar region: Secondary | ICD-10-CM | POA: Diagnosis not present

## 2021-11-13 DIAGNOSIS — F32A Depression, unspecified: Secondary | ICD-10-CM | POA: Diagnosis not present

## 2021-11-13 DIAGNOSIS — F419 Anxiety disorder, unspecified: Secondary | ICD-10-CM

## 2021-11-13 MED ORDER — PAROXETINE HCL ER 12.5 MG PO TB24: 12.5000 mg | ORAL_TABLET | Freq: Every day | ORAL | 0 refills | Status: DC

## 2021-11-13 NOTE — Patient Instructions (Signed)
-   Continue duloxetine daily - Can dose Celebrex on an as-needed basis - Continue with physical therapy for the neck and "maintenance" home exercises for the low back - Start Paxil (paroxetine) daily, contact us for any issues and towards the end of the Rx for a status update - Contact your current therapy group to see if they have anybody that can prescribe medications, if not contact the number below for scheduling: [place] [phone]

## 2021-11-13 NOTE — Progress Notes (Signed)
Primary Care / Sports Medicine Office Visit  Patient Information:  Patient ID: Harry Williams, male DOB: March 09, 1981 Age: 40 y.o. MRN: [id number]   Velmer Panjwani is a pleasant 40 y.o. male presenting with the following:  Chief Complaint  Patient presents with   Follow-up   Cervical spondylosis    Following with PT and taking medications as prescribed with relief of symptoms; denies pain in office today   Lumbar spondylolysis    Patient Active Problem List   Diagnosis Date Noted   Cervical spondylosis 11/13/2021   Annual physical exam 09/04/2021   Lumbar spondylolysis 07/24/2021   Anxiety and depression 07/24/2021   Adenomatous polyp 04/04/2015    Vitals:   11/13/21 0922  BP: 106/78  Pulse: 83  SpO2: 97%   Vitals:   11/13/21 0922  Weight: 181 lb (82.1 kg)  Height: 5\' 9"  (1.753 m)   Body mass index is 26.73 kg/m.  No results found.   Independent interpretation of notes and tests performed by another provider:   None  Procedures performed:   None  Pertinent History, Exam, Impression, and Recommendations:   Anxiety and depression Depression screen [facility] 2/9 11/13/2021 10/02/2021 09/04/2021 07/24/2021 03/24/2016  Decreased Interest 3 1 2 2  0  Down, Depressed, Hopeless 3 3 2 2  0  PHQ - 2 Score 6 4 4 4  0  Altered sleeping 1 3 0 0 -  Tired, decreased energy 3 3 3 3  -  Change in appetite 1 0 0 0 -  Feeling bad or failure about yourself  3 2 2 1  -  Trouble concentrating 3 3 3 3  -  Moving slowly or fidgety/restless 1 0 0 1 -  Suicidal thoughts 1 0 0 0 -  PHQ-9 Score 19 15 12 12  -  Difficult doing work/chores Extremely dIfficult Very difficult Very difficult Very difficult -   GAD 7 : Generalized Anxiety Score 11/13/2021 10/02/2021 09/04/2021 07/24/2021  Nervous, Anxious, on Edge 3 3 3 3   Control/stop worrying 3 3 3 3   Worry too much - different things 3 2 3 3   Trouble relaxing 3 2 3 3   Restless 1 2 2 2   Easily annoyed or irritable 3 3 2 2   Afraid - awful might  happen 3 2 3 1   Total GAD 7 Score 19 17 19 17   Anxiety Difficulty - Very difficult Very difficult Very difficult   Does cite recent life stressors (loss of loved ones), despite this is scores have maintained high levels despite titration of duloxetine.  He denies any ideations.  At this stage we reviewed additional treatment strategies and he is amenable to further evaluation by psychiatry, referral placed, additionally he will continue therapy sessions with his current therapist, lastly coadministration of paroxetine given stated concern for tardive dyskinesia symptoms from duloxetine (these have diminished/nearly resolved).  Cervical spondylosis Chronic condition that is stable, he has been compliant with physical therapy sessions, duloxetine dosing, has noted near resolution of symptoms.  I advised him to finish out remaining PT sessions and transition to fully home-based maintenance program for rehab.  Lumbar spondylolysis Chronic condition that is stable, has completed physical therapy for this issue, has been compliant with home exercise regimen and duloxetine dosing, denies any ongoing symptomatology.  I have advised him to continue maintenance home exercises.   Orders & Medications Meds ordered this encounter  Medications   PARoxetine (PAXIL CR) 12.5 MG 24 hr tablet    Sig: Take 1 tablet (12.5 mg total) by  mouth daily.    Dispense:  30 tablet    Refill:  0   Orders Placed This Encounter  Procedures   Ambulatory referral to Psychiatry     Return if symptoms worsen or fail to improve.     [name], MD   Primary Care Sports Medicine [facility]

## 2021-11-13 NOTE — Assessment & Plan Note (Signed)
Chronic condition that is stable, he has been compliant with physical therapy sessions, duloxetine dosing, has noted near resolution of symptoms.  I advised him to finish out remaining PT sessions and transition to fully home-based maintenance program for rehab.

## 2021-11-13 NOTE — Assessment & Plan Note (Signed)
>>  ASSESSMENT AND PLAN FOR ANXIETY AND DEPRESSION WRITTEN ON 11/13/2021 10:01 AM BY Tanay Massiah, SELINDA PARAS, MD  Depression screen [facility] 2/9 11/13/2021 10/02/2021 09/04/2021 07/24/2021 03/24/2016  Decreased Interest 3 1 2 2  0  Down, Depressed, Hopeless 3 3 2 2  0  PHQ - 2 Score 6 4 4 4  0  Altered sleeping 1 3 0 0 -  Tired, decreased energy 3 3 3 3  -  Change in appetite 1 0 0 0 -  Feeling bad or failure about yourself  3 2 2 1  -  Trouble concentrating 3 3 3 3  -  Moving slowly or fidgety/restless 1 0 0 1 -  Suicidal thoughts 1 0 0 0 -  PHQ-9 Score 19 15 12 12  -  Difficult doing work/chores Extremely dIfficult Very difficult Very difficult Very difficult -   GAD 7 : Generalized Anxiety Score 11/13/2021 10/02/2021 09/04/2021 07/24/2021  Nervous, Anxious, on Edge 3 3 3 3   Control/stop worrying 3 3 3 3   Worry too much - different things 3 2 3 3   Trouble relaxing 3 2 3 3   Restless 1 2 2 2   Easily annoyed or irritable 3 3 2 2   Afraid - awful might happen 3 2 3 1   Total GAD 7 Score 19 17 19 17   Anxiety Difficulty - Very difficult Very difficult Very difficult   Does cite recent life stressors (loss of loved ones), despite this is scores have maintained high levels despite titration of duloxetine .  He denies any ideations.  At this stage we reviewed additional treatment strategies and he is amenable to further evaluation by psychiatry, referral placed, additionally he will continue therapy sessions with his current therapist, lastly coadministration of paroxetine  given stated concern for tardive dyskinesia symptoms from duloxetine  (these have diminished/nearly resolved).

## 2021-11-13 NOTE — Assessment & Plan Note (Addendum)
Depression screen [facility] 2/9 11/13/2021 10/02/2021 09/04/2021 07/24/2021 03/24/2016  Decreased Interest 3 1 2 2  0  Down, Depressed, Hopeless 3 3 2 2  0  PHQ - 2 Score 6 4 4 4  0  Altered sleeping 1 3 0 0 -  Tired, decreased energy 3 3 3 3  -  Change in appetite 1 0 0 0 -  Feeling bad or failure about yourself  3 2 2 1  -  Trouble concentrating 3 3 3 3  -  Moving slowly or fidgety/restless 1 0 0 1 -  Suicidal thoughts 1 0 0 0 -  PHQ-9 Score 19 15 12 12  -  Difficult doing work/chores Extremely dIfficult Very difficult Very difficult Very difficult -   GAD 7 : Generalized Anxiety Score 11/13/2021 10/02/2021 09/04/2021 07/24/2021  Nervous, Anxious, on Edge 3 3 3 3   Control/stop worrying 3 3 3 3   Worry too much - different things 3 2 3 3   Trouble relaxing 3 2 3 3   Restless 1 2 2 2   Easily annoyed or irritable 3 3 2 2   Afraid - awful might happen 3 2 3 1   Total GAD 7 Score 19 17 19 17   Anxiety Difficulty - Very difficult Very difficult Very difficult   Does cite recent life stressors (loss of loved ones), despite this is scores have maintained high levels despite titration of duloxetine.  He denies any ideations.  At this stage we reviewed additional treatment strategies and he is amenable to further evaluation by psychiatry, referral placed, additionally he will continue therapy sessions with his current therapist, lastly coadministration of paroxetine given stated concern for tardive dyskinesia symptoms from duloxetine (these have diminished/nearly resolved).

## 2021-11-13 NOTE — Assessment & Plan Note (Signed)
Chronic condition that is stable, has completed physical therapy for this issue, has been compliant with home exercise regimen and duloxetine dosing, denies any ongoing symptomatology.  I have advised him to continue maintenance home exercises.

## 2021-11-13 NOTE — Assessment & Plan Note (Signed)
>>  ASSESSMENT AND PLAN FOR LUMBAR SPONDYLOLYSIS WRITTEN ON 11/13/2021 10:02 AM BY Derec Mozingo, [name], MD  Chronic condition that is stable, has completed physical therapy for this issue, has been compliant with home exercise regimen and duloxetine dosing, denies any ongoing symptomatology.  I have advised him to continue maintenance home exercises.

## 2021-11-27 ENCOUNTER — Other Ambulatory Visit: Payer: Self-pay

## 2021-11-27 ENCOUNTER — Ambulatory Visit: Payer: BC Managed Care – PPO

## 2021-11-27 DIAGNOSIS — M4306 Spondylolysis, lumbar region: Secondary | ICD-10-CM

## 2021-11-27 DIAGNOSIS — M47812 Spondylosis without myelopathy or radiculopathy, cervical region: Secondary | ICD-10-CM
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
## 2021-12-05 ENCOUNTER — Ambulatory Visit: Payer: BC Managed Care – PPO | Admitting: Rehabilitative and Restorative Service Providers"
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
## 2021-12-11 ENCOUNTER — Ambulatory Visit: Payer: BC Managed Care – PPO | Attending: Family Medicine

## 2021-12-11 ENCOUNTER — Other Ambulatory Visit: Payer: Self-pay

## 2021-12-11 DIAGNOSIS — M6289 Other specified disorders of muscle: Secondary | ICD-10-CM | POA: Insufficient documentation

## 2021-12-11 DIAGNOSIS — M47812 Spondylosis without myelopathy or radiculopathy, cervical region: Secondary | ICD-10-CM | POA: Insufficient documentation

## 2021-12-11 DIAGNOSIS — R293 Abnormal posture: Secondary | ICD-10-CM | POA: Diagnosis present
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
## 2021-12-13 ENCOUNTER — Other Ambulatory Visit: Payer: Self-pay | Admitting: Family Medicine

## 2021-12-13 DIAGNOSIS — F419 Anxiety disorder, unspecified: Secondary | ICD-10-CM

## 2021-12-13 DIAGNOSIS — F32A Depression, unspecified: Secondary | ICD-10-CM

## 2021-12-13 NOTE — Telephone Encounter (Signed)
Requested Prescriptions  Pending Prescriptions Disp Refills   PARoxetine (PAXIL-CR) 12.5 MG 24 hr tablet [Pharmacy Med Name: PAROXETINE ER 12.5MG  TABLETS] 30 tablet 0    Sig: TAKE 1 TABLET(12.5 MG) BY MOUTH DAILY     Psychiatry:  Antidepressants - SSRI Passed - 12/13/2021  3:32 AM      Passed - Completed PHQ-2 or PHQ-9 in the last 360 days      Passed - Valid encounter within last 6 months    Recent Outpatient Visits          1 month ago Anxiety and depression   [facility] Montel Culver, MD   2 months ago Cervical spondylosis   [facility] Montel Culver, MD   3 months ago Annual physical exam   [facility] Montel Culver, MD   4 months ago Lumbar spondylolysis   [facility] Montel Culver, MD   5 years ago Back pain with left-sided radiculopathy   [facility], PA-C

## 2021-12-19 ENCOUNTER — Encounter: Payer: Self-pay | Admitting: Rehabilitative and Restorative Service Providers"

## 2021-12-19 ENCOUNTER — Ambulatory Visit: Payer: BC Managed Care – PPO | Admitting: Rehabilitative and Restorative Service Providers"

## 2021-12-19 ENCOUNTER — Other Ambulatory Visit: Payer: Self-pay

## 2021-12-19 DIAGNOSIS — R293 Abnormal posture: Secondary | ICD-10-CM

## 2021-12-19 DIAGNOSIS — M6289 Other specified disorders of muscle: Secondary | ICD-10-CM

## 2021-12-19 DIAGNOSIS — M47812 Spondylosis without myelopathy or radiculopathy, cervical region: Secondary | ICD-10-CM
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
## 2021-12-25 ENCOUNTER — Ambulatory Visit: Payer: BC Managed Care – PPO

## 2021-12-25 ENCOUNTER — Other Ambulatory Visit: Payer: Self-pay

## 2021-12-25 DIAGNOSIS — M47812 Spondylosis without myelopathy or radiculopathy, cervical region: Secondary | ICD-10-CM | POA: Diagnosis not present

## 2021-12-25 DIAGNOSIS — M6289 Other specified disorders of muscle: Secondary | ICD-10-CM

## 2021-12-25 DIAGNOSIS — R293 Abnormal posture: Secondary | ICD-10-CM
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
## 2022-01-02 ENCOUNTER — Ambulatory Visit: Payer: BC Managed Care – PPO | Admitting: Rehabilitative and Restorative Service Providers"
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
## 2022-01-09 ENCOUNTER — Other Ambulatory Visit: Payer: Self-pay

## 2022-01-09 ENCOUNTER — Ambulatory Visit: Payer: BC Managed Care – PPO | Attending: Family Medicine

## 2022-01-09 DIAGNOSIS — R293 Abnormal posture: Secondary | ICD-10-CM | POA: Insufficient documentation

## 2022-01-09 DIAGNOSIS — M6289 Other specified disorders of muscle: Secondary | ICD-10-CM | POA: Insufficient documentation

## 2022-01-09 DIAGNOSIS — M47812 Spondylosis without myelopathy or radiculopathy, cervical region: Secondary | ICD-10-CM | POA: Insufficient documentation

## 2022-01-16 ENCOUNTER — Ambulatory Visit: Payer: BC Managed Care – PPO

## 2022-01-18 ENCOUNTER — Other Ambulatory Visit: Payer: Self-pay | Admitting: Family Medicine

## 2022-01-18 DIAGNOSIS — F419 Anxiety disorder, unspecified: Secondary | ICD-10-CM

## 2022-01-18 DIAGNOSIS — F32A Depression, unspecified: Secondary | ICD-10-CM

## 2022-01-19 ENCOUNTER — Telehealth: Payer: Self-pay

## 2022-01-19 NOTE — Telephone Encounter (Signed)
Patient needs an appointment for future refills.  Please schedule.

## 2022-01-19 NOTE — Telephone Encounter (Signed)
Could not reach patient °

## 2022-01-19 NOTE — Telephone Encounter (Signed)
Patient will need medication refill follow up with dr Zigmund Daniel .

## 2022-01-19 NOTE — Telephone Encounter (Signed)
Requested medication (s) are due for refill today - yes  Requested medication (s) are on the active medication list -yes  Future visit scheduled -no  Last refill: 12/13/21 #30  Notes to clinic: Request RF: Patient was to follow up with psychiatry- is this a medication PCP is going to continue to Rx.  Requested Prescriptions  Pending Prescriptions Disp Refills   PARoxetine (PAXIL-CR) 12.5 MG 24 hr tablet [Pharmacy Med Name: PAROXETINE ER 12.5MG  TABLETS] 30 tablet 0    Sig: TAKE 1 TABLET(12.5 MG) BY MOUTH DAILY     Psychiatry:  Antidepressants - SSRI Passed - 01/18/2022  3:32 AM      Passed - Completed PHQ-2 or PHQ-9 in the last 360 days      Passed - Valid encounter within last 6 months    Recent Outpatient Visits           2 months ago Anxiety and depression   [facility] Montel Culver, MD   3 months ago Cervical spondylosis   [facility] Montel Culver, MD   4 months ago Annual physical exam   [facility] Montel Culver, MD   5 months ago Lumbar spondylolysis   [facility] Montel Culver, MD   5 years ago Back pain with left-sided radiculopathy   [facility] [place], PA-C                 Requested Prescriptions  Pending Prescriptions Disp Refills   PARoxetine (PAXIL-CR) 12.5 MG 24 hr tablet [Pharmacy Med Name: PAROXETINE ER 12.5MG  TABLETS] 30 tablet 0    Sig: TAKE 1 TABLET(12.5 MG) BY MOUTH DAILY     Psychiatry:  Antidepressants - SSRI Passed - 01/18/2022  3:32 AM      Passed - Completed PHQ-2 or PHQ-9 in the last 360 days      Passed - Valid encounter within last 6 months    Recent Outpatient Visits           2 months ago Anxiety and depression   [facility] Montel Culver, MD   3 months ago Cervical spondylosis   [facility] Montel Culver, MD   4 months ago Annual physical exam   [facility] Montel Culver, MD   5 months ago Lumbar spondylolysis    [facility] Montel Culver, MD   5 years ago Back pain with left-sided radiculopathy   [facility] [place], PA-C

## 2022-01-30 ENCOUNTER — Encounter: Payer: BC Managed Care – PPO | Admitting: Rehabilitative and Restorative Service Providers"

## 2022-02-05 NOTE — Telephone Encounter (Signed)
Patient needs an appointment for future refills of Duloxetine.  Please schedule.  ? ?

## 2022-02-11 ENCOUNTER — Other Ambulatory Visit: Payer: Self-pay

## 2022-02-11 ENCOUNTER — Encounter: Payer: Self-pay | Admitting: Family Medicine

## 2022-02-11 ENCOUNTER — Ambulatory Visit (INDEPENDENT_AMBULATORY_CARE_PROVIDER_SITE_OTHER): Payer: BC Managed Care – PPO | Admitting: Family Medicine

## 2022-02-11 VITALS — BP 112/68 | HR 67 | Ht 69.0 in | Wt 186.0 lb

## 2022-02-11 DIAGNOSIS — F419 Anxiety disorder, unspecified: Secondary | ICD-10-CM | POA: Diagnosis not present

## 2022-02-11 DIAGNOSIS — G5702 Lesion of sciatic nerve, left lower limb: Secondary | ICD-10-CM | POA: Insufficient documentation

## 2022-02-11 DIAGNOSIS — F32A Depression, unspecified: Secondary | ICD-10-CM | POA: Diagnosis not present

## 2022-02-11 DIAGNOSIS — R55 Syncope and collapse: Secondary | ICD-10-CM | POA: Diagnosis not present

## 2022-02-11 MED ORDER — PAROXETINE HCL ER 25 MG PO TB24: 25.0000 mg | ORAL_TABLET | Freq: Every day | ORAL | 2 refills | Status: DC

## 2022-02-11 NOTE — Assessment & Plan Note (Signed)
>>  ASSESSMENT AND PLAN FOR ANXIETY AND DEPRESSION WRITTEN ON 02/11/2022  5:39 PM BY Kycen Spalla J, MD  Patient has demonstrated modest improvement in PHQ and GAD scores, subjectively he does not the same.  He is tolerating his medication well without adverse effects.  Unfortunately, he was unable to coordinate a follow-up with psychiatry despite referral placed at last visit and patient having sent in correct paperwork.  We discussed treatment strategies and he is amenable to further titration of Paxil  to 25 mg, new referral to psychiatry placed.  Chronic condition, symptomatic, Rx management

## 2022-02-11 NOTE — Patient Instructions (Addendum)
Conservative treatment of vagally mediated syncope involves: ?- recognition of prodromal and maneuvers to abort attacks ?- avoidance of precipitating factors, such as prolonged standing, rapid postural changes, and excessive alcohol intake ?- liberalization of salt and fluid intake coupled with compression stockings also decrease events ? ?-Reviewed the above information on avoiding laughter induced syncope ?- Start home exercises for piriformis syndrome ?- Take new dose of Paxil ?- Referral coordinator should contact you again in regards to scheduling a visit with psychiatry ?- Return for follow-up in 5 months for annual physical, contact us for any questions or concerns between now and then ?

## 2022-02-11 NOTE — Assessment & Plan Note (Signed)
Patient brings up left lower leg symptoms, examination most consistent with piriformis syndrome with focal tenderness and sciatic nerve involvement upon piriformis palpation.  He has a negative straight leg raise.  Treatment strategies outlined and he will perform home-based exercises, can follow-up for recalcitrant symptoms at which time we can consider formal PT, medications, ultrasound-guided injections. ?

## 2022-02-11 NOTE — Progress Notes (Signed)
?  ? ?  Primary Care / Sports Medicine Office Visit ? ?Patient Information:  ?Patient ID: Harry Williams, male DOB: 12-13-1980 Age: 41 y.o. MRN: [id number]  ? ?Harry Williams is a pleasant 41 y.o. male presenting with the following: ? ?Chief Complaint  ?Patient presents with  ? Medication Refill  ?  Feels like it is working, getting better not 100% yet  ? ? ?Vitals:  ? 02/11/22 1100  ?BP: 112/68  ?Pulse: 67  ?SpO2: 98%  ? ?Vitals:  ? 02/11/22 1100  ?Weight: 186 lb (84.4 kg)  ?Height: 5\' 9"  (1.753 m)  ? ?Body mass index is 27.47 kg/m?. ? ?No results found.  ? ?Independent interpretation of notes and tests performed by another provider:  ? ?None ? ?Procedures performed:  ? ?None ? ?Pertinent History, Exam, Impression, and Recommendations:  ? ?Anxiety and depression ?Patient has demonstrated modest improvement in PHQ and GAD scores, subjectively he does not the same.  He is tolerating his medication well without adverse effects.  Unfortunately, he was unable to coordinate a follow-up with psychiatry despite referral placed at last visit and patient having sent in correct paperwork. ? ?We discussed treatment strategies and he is amenable to further titration of Paxil to 25 mg, new referral to psychiatry placed. ? ?Chronic condition, symptomatic, Rx management ? ?Vasovagal syncope ?Patient brings up longstanding history of heavy laughter related syncope and near syncope.  Literature reviewed and there are multiple case reports regarding the same, vasovagal etiology of primary concern and treatment oriented around prevention/assessment and avoidance of precipitating factors, avoiding further hypotension, and the role of medications. ? ?These were reviewed with the patient, at this stage we will address this in a nonpharmacologic manner, he can follow-up on as-needed basis for this issue. ? ?Piriformis syndrome of left side ?Patient brings up left lower leg symptoms, examination most consistent with piriformis syndrome with  focal tenderness and sciatic nerve involvement upon piriformis palpation.  He has a negative straight leg raise.  Treatment strategies outlined and he will perform home-based exercises, can follow-up for recalcitrant symptoms at which time we can consider formal PT, medications, ultrasound-guided injections.  ? ?Orders & Medications ?Meds ordered this encounter  ?Medications  ? PARoxetine (PAXIL CR) 25 MG 24 hr tablet  ?  Sig: Take 1 tablet (25 mg total) by mouth daily.  ?  Dispense:  30 tablet  ?  Refill:  2  ? ?Orders Placed This Encounter  ?Procedures  ? Ambulatory referral to Psychiatry  ?  ? ?Return in about 5 months (around 07/14/2022) for Annual Physical.  ?  ? ?[name], MD ? ? Primary Care Sports Medicine ?[facility] ?[facility]  ? ?

## 2022-02-11 NOTE — Assessment & Plan Note (Signed)
Patient has demonstrated modest improvement in PHQ and GAD scores, subjectively he does not the same.  He is tolerating his medication well without adverse effects.  Unfortunately, he was unable to coordinate a follow-up with psychiatry despite referral placed at last visit and patient having sent in correct paperwork. ? ?We discussed treatment strategies and he is amenable to further titration of Paxil to 25 mg, new referral to psychiatry placed. ? ?Chronic condition, symptomatic, Rx management ?

## 2022-02-11 NOTE — Assessment & Plan Note (Signed)
Patient brings up longstanding history of heavy laughter related syncope and near syncope.  Literature reviewed and there are multiple case reports regarding the same, vasovagal etiology of primary concern and treatment oriented around prevention/assessment and avoidance of precipitating factors, avoiding further hypotension, and the role of medications. ? ?These were reviewed with the patient, at this stage we will address this in a nonpharmacologic manner, he can follow-up on as-needed basis for this issue. ?

## 2022-03-04 ENCOUNTER — Encounter: Payer: Self-pay | Admitting: Family Medicine

## 2022-03-04 ENCOUNTER — Ambulatory Visit (INDEPENDENT_AMBULATORY_CARE_PROVIDER_SITE_OTHER): Payer: BC Managed Care – PPO | Admitting: Family Medicine

## 2022-03-04 ENCOUNTER — Ambulatory Visit: Payer: Self-pay | Admitting: *Deleted

## 2022-03-04 ENCOUNTER — Ambulatory Visit
Admission: RE | Admit: 2022-03-04 | Discharge: 2022-03-04 | Disposition: A | Discharge status: Home / Self Care | Payer: BC Managed Care – PPO | Attending: Family Medicine | Admitting: Family Medicine

## 2022-03-04 ENCOUNTER — Ambulatory Visit
Admission: RE | Admit: 2022-03-04 | Discharge: 2022-03-04 | Disposition: A | Discharge status: Home / Self Care | Payer: BC Managed Care – PPO | Source: Ambulatory Visit | Attending: Family Medicine | Admitting: Family Medicine

## 2022-03-04 VITALS — BP 122/68 | HR 73 | Ht 69.0 in | Wt 174.0 lb

## 2022-03-04 DIAGNOSIS — G5702 Lesion of sciatic nerve, left lower limb: Secondary | ICD-10-CM | POA: Diagnosis not present

## 2022-03-04 DIAGNOSIS — M5416 Radiculopathy, lumbar region: Secondary | ICD-10-CM

## 2022-03-04 MED ORDER — CELECOXIB 200 MG PO CAPS: 200.0000 mg | ORAL_CAPSULE | Freq: Two times a day (BID) | ORAL | 0 refills | Status: DC | PRN

## 2022-03-04 MED ORDER — PREDNISONE 50 MG PO TABS: 50.0000 mg | ORAL_TABLET | Freq: Every day | ORAL | 0 refills | Status: DC

## 2022-03-04 NOTE — Assessment & Plan Note (Signed)
>>  ASSESSMENT AND PLAN FOR LEFT LUMBAR RADICULOPATHY WRITTEN ON 03/04/2022  5:30 PM BY Jadore Mcguffin, [name], MD  Patient returns for follow-up to left sided piriformis syndrome.  He states that symptoms had been relatively well controlled with exercises, no need of medications, they had significantly progressed without any specific aggravating activity.  Describes paresthesias primarily to the knee, does have intermittent paresthesias distally.  Upon further questioning, he does state that he has noted similar symptoms dating back to 2004 where symptoms necessitated the need for spinal injections while he was in [place], [place].  Examination today reveals positive piriformis testing, tenderness with sciatic nerve involvement during palpation, equivocal left-sided straight leg raise, diffuse mild paraspinal left greater than right tenderness.  I have advised a course of prednisone given the interval worsening, transition to Celebrex thereafter, lumbar spine x-rays, and follow-up in 1 month.  He can restart home exercises and a slow and graded manner if symptoms allow.  Chronic condition, exacerbation, Rx management

## 2022-03-04 NOTE — Patient Instructions (Signed)
-   Obtain low back x-ray ?- Start prednisone and take for full 5-day course ?- After prednisone complete, transition to Celebrex 1-2 tablets daily (take with food) ?- Alternatively, if unable to tolerate prednisone, start scheduled Celebrex twice daily until follow-up ?- Remain out of work with work note, can return to work sooner if symptoms allow ?- After this weekend, can start and gradually progress home exercises if symptoms allow ?- Return for follow-up in 4 weeks ?

## 2022-03-04 NOTE — Assessment & Plan Note (Signed)
Patient returns for follow-up to left sided piriformis syndrome.  He states that symptoms had been relatively well controlled with exercises, no need of medications, they had significantly progressed without any specific aggravating activity.  Describes paresthesias primarily to the knee, does have intermittent paresthesias distally.  Upon further questioning, he does state that he has noted similar symptoms dating back to 2004 where symptoms necessitated the need for spinal injections while he was in [place], [place]. ? ?Examination today reveals positive piriformis testing, tenderness with sciatic nerve involvement during palpation, equivocal left-sided straight leg raise, diffuse mild paraspinal left greater than right tenderness. ? ?I have advised a course of prednisone given the interval worsening, transition to Celebrex thereafter, lumbar spine x-rays, and follow-up in 1 month.  He can restart home exercises and a slow and graded manner if symptoms allow. ? ?Chronic condition, exacerbation, Rx management ?

## 2022-03-04 NOTE — Telephone Encounter (Signed)
Noted  Pt has an appt  KP 

## 2022-03-04 NOTE — Assessment & Plan Note (Signed)
See additional assessment(s) for plan details. 

## 2022-03-04 NOTE — Telephone Encounter (Addendum)
?  Chief Complaint: Back Pain ?Symptoms: Lower back, buttock pain, radiates down left leg to knee, bowel and bladder urgency ?Frequency: "Some time" ?Pertinent Negatives: Patient denies fever, dysuria ?Disposition: '[]'$ ED /'[]'$ Urgent Care (no appt availability in office) / '[x]'$ Appointment(In office/virtual)/ '[]'$  [place]/ '[]'$ Home Care/ '[]'$ Refused Recommended Disposition /'[]'$ [facility]/ '[]'$  Follow-up with PCP ?Additional Notes: Appt secured for today with Dr. Zigmund Daniel. Care advise given, verbalizes understanding. ?Reason for Disposition ? [1] SEVERE back pain (e.g., excruciating, unable to do any normal activities) AND [2] not improved 2 hours after pain medicine ? ?Answer Assessment - Initial Assessment Questions ?1. ONSET: "When did the pain begin?"  ?    Discussed with PCP in March, worsening ?2. LOCATION: "Where does it hurt?" (upper, mid or lower back) ?    Lower back, buttocks, left leg ?3. SEVERITY: "How bad is the pain?"  (e.g., Scale 1-10; mild, moderate, or severe) ?  - MILD (1-3): doesn't interfere with normal activities  ?  - MODERATE (4-7): interferes with normal activities or awakens from sleep  ?  - SEVERE (8-10): excruciating pain, unable to do any normal activities  ?    10/10 last few days. 5/10 presently ?4. PATTERN: "Is the pain constant?" (e.g., yes, no; constant, intermittent)  ?    Stretches, before and after. Mild sitting, long period of sitting worse, walking awkward ?5. RADIATION: "Does the pain shoot into your legs or elsewhere?" ?    Left leg ?6. CAUSE:  "What do you think is causing the back pain?"  ?    Unsure ?7. BACK OVERUSE:  "Any recent lifting of heavy objects, strenuous work or exercise?" ?    No ?8. MEDICATIONS: "What have you taken so far for the pain?" (e.g., nothing, acetaminophen, NSAIDS) ?    Help a little ?9. NEUROLOGIC SYMPTOMS: "Do you have any weakness, numbness, or problems with bowel/bladder control?" ?    Bowel and bladder urgency, both ?10. OTHER  SYMPTOMS: "Do you have any other symptoms?" (e.g., fever, abdominal pain, burning with urination, blood in urine) ?      No ? ?Protocols used: Back Pain-A-AH ? ?

## 2022-03-04 NOTE — Progress Notes (Signed)
?  ? ?  Primary Care / Sports Medicine Office Visit ? ?Patient Information:  ?Patient ID: Harry Williams, male DOB: 1981/06/17 Age: 41 y.o. MRN: [id number]  ? ?Harry Williams is a pleasant 41 y.o. male presenting with the following: ? ?Chief Complaint  ?Patient presents with  ? Back Pain  ?  X1 months, Lower back pain, shooting down left leg, getting worse, no injuries, no meds for back pain   ? ? ?Vitals:  ? 03/04/22 1347  ?BP: 122/68  ?Pulse: 73  ?SpO2: 98%  ? ?Vitals:  ? 03/04/22 1347  ?Weight: 174 lb (78.9 kg)  ?Height: 5\' 9"  (1.753 m)  ? ?Body mass index is 25.7 kg/m?. ? ?No results found.  ? ?Independent interpretation of notes and tests performed by another provider:  ? ?None ? ?Procedures performed:  ? ?None ? ?Pertinent History, Exam, Impression, and Recommendations:  ? ?Piriformis syndrome of left side ?See additional assessment(s) for plan details. ? ?Left lumbar radiculopathy ?Patient returns for follow-up to left sided piriformis syndrome.  He states that symptoms had been relatively well controlled with exercises, no need of medications, they had significantly progressed without any specific aggravating activity.  Describes paresthesias primarily to the knee, does have intermittent paresthesias distally.  Upon further questioning, he does state that he has noted similar symptoms dating back to 2004 where symptoms necessitated the need for spinal injections while he was in [place], [place]. ? ?Examination today reveals positive piriformis testing, tenderness with sciatic nerve involvement during palpation, equivocal left-sided straight leg raise, diffuse mild paraspinal left greater than right tenderness. ? ?I have advised a course of prednisone given the interval worsening, transition to Celebrex thereafter, lumbar spine x-rays, and follow-up in 1 month.  He can restart home exercises and a slow and graded manner if symptoms allow. ? ?Chronic condition, exacerbation, Rx management  ? ?Orders & Medications ?Meds  ordered this encounter  ?Medications  ? predniSONE (DELTASONE) 50 MG tablet  ?  Sig: Take 1 tablet (50 mg total) by mouth daily.  ?  Dispense:  5 tablet  ?  Refill:  0  ? celecoxib (CELEBREX) 200 MG capsule  ?  Sig: Take 1 capsule (200 mg total) by mouth 2 (two) times daily as needed. One to 2 tablets by mouth daily as needed for pain.  ?  Dispense:  60 capsule  ?  Refill:  0  ? ?Orders Placed This Encounter  ?Procedures  ? DG Lumbar Spine Complete  ?  ? ?Return in about 4 weeks (around 04/01/2022).  ?  ? ?[name], MD ? ? Primary Care Sports Medicine ?[facility] ?[facility]  ? ?

## 2022-04-01 ENCOUNTER — Encounter: Payer: Self-pay | Admitting: Family Medicine

## 2022-04-01 ENCOUNTER — Ambulatory Visit (INDEPENDENT_AMBULATORY_CARE_PROVIDER_SITE_OTHER): Payer: BC Managed Care – PPO | Admitting: Family Medicine

## 2022-04-01 VITALS — BP 120/82 | HR 73 | Ht 69.0 in | Wt 180.0 lb

## 2022-04-01 DIAGNOSIS — M4726 Other spondylosis with radiculopathy, lumbar region: Secondary | ICD-10-CM

## 2022-04-01 DIAGNOSIS — G4700 Insomnia, unspecified: Secondary | ICD-10-CM

## 2022-04-01 MED ORDER — QUVIVIQ 50 MG PO TABS: 50.0000 mg | ORAL_TABLET | Freq: Every evening | ORAL | 2 refills | Status: DC | PRN

## 2022-04-01 NOTE — Assessment & Plan Note (Signed)
Patient has noted improvement though not resolution of lumbosacral symptoms, I have advised continued home-based rehab, continue as needed Celebrex and methocarbamol, and for recalcitrant symptomatology, advanced imaging to be considered. ?

## 2022-04-01 NOTE — Progress Notes (Signed)
?  ? ?  Primary Care / Sports Medicine Office Visit ? ?Patient Information:  ?Patient ID: Harry Williams, male DOB: Jun 21, 1981 Age: 41 y.o. MRN: [id number]  ? ?Harry Williams is a pleasant 41 y.o. male presenting with the following: ? ?Chief Complaint  ?Patient presents with  ? Back Pain  ? ? ?Vitals:  ? 04/01/22 0900  ?BP: 120/82  ?Pulse: 73  ?SpO2: 98%  ? ?Vitals:  ? 04/01/22 0900  ?Weight: 180 lb (81.6 kg)  ?Height: 5\' 9"  (1.753 m)  ? ?Body mass index is 26.58 kg/m?. ? ?DG Lumbar Spine Complete ? ?Result Date: 03/06/2022 ?CLINICAL DATA:  Chronic lower back, hip and left radiculopathy pain for several years, and notes flares of numbness and tingling going down the back side of left leg. EXAM: LUMBAR SPINE - COMPLETE 4+ VIEW COMPARISON:  CT renal 07/09/2021 FINDINGS: Five non-rib-bearing lumbar vertebral bodies. L4-L5 and L5-S1 osteophyte formation facet arthropathy. There is no evidence of lumbar spine fracture. Alignment is normal. Intervertebral disc spaces are maintained. Anastomotic sutures.  Gaseous distension of the colon. IMPRESSION: No acute displaced fracture or traumatic listhesis of the lumbar spine. Electronically Signed   By: Tish Frederickson M.D.   On: 03/06/2022 00:00    ? ?Independent interpretation of notes and tests performed by another provider:  ? ?None ? ?Procedures performed:  ? ?None ? ?Pertinent History, Exam, Impression, and Recommendations:  ? ?Problem List Items Addressed This Visit   ? ?  ? Musculoskeletal and Integument  ? Lumbar spondylolysis - Primary  ?  Patient has noted improvement though not resolution of lumbosacral symptoms, I have advised continued home-based rehab, continue as needed Celebrex and methocarbamol, and for recalcitrant symptomatology, advanced imaging to be considered. ? ?  ?  ?  ? Other  ? Insomnia  ?  Chronic condition with ongoing symptomatology the setting of comorbid anxiety/depression, cervical and lumbosacral pain.  Plan for trial of as needed Quviviq while other  medical comorbidities are specifically addressed. ? ?Chronic condition, symptomatic, Rx management ? ?  ?  ? Relevant Medications  ? Daridorexant HCl (QUVIVIQ) 50 MG TABS  ?  ? ?Orders & Medications ?Meds ordered this encounter  ?Medications  ? Daridorexant HCl (QUVIVIQ) 50 MG TABS  ?  Sig: Take 50 mg by mouth at bedtime as needed.  ?  Dispense:  30 tablet  ?  Refill:  2  ? ?No orders of the defined types were placed in this encounter. ?  ? ?Return in about 3 months (around 07/02/2022).  ?  ? ?[name], MD ? ? Primary Care Sports Medicine ?[facility] ?[facility]  ? ?

## 2022-04-01 NOTE — Assessment & Plan Note (Signed)
>>  ASSESSMENT AND PLAN FOR LUMBAR SPONDYLOLYSIS WRITTEN ON 04/01/2022  5:27 PM BY Odetta Forness, [name], MD  Patient has noted improvement though not resolution of lumbosacral symptoms, I have advised continued home-based rehab, continue as needed Celebrex and methocarbamol, and for recalcitrant symptomatology, advanced imaging to be considered.

## 2022-04-01 NOTE — Assessment & Plan Note (Signed)
Chronic condition with ongoing symptomatology the setting of comorbid anxiety/depression, cervical and lumbosacral pain.  Plan for trial of as needed Quviviq while other medical comorbidities are specifically addressed. ? ?Chronic condition, symptomatic, Rx management ?

## 2022-04-01 NOTE — Patient Instructions (Signed)
-   Transition to as needed dosing of Celebrex ?- Discontinue methocarbamol ?- Start Quviviq nightly on as-needed basis for sleep ?- Perform home exercises with the [facility] spine conditioning program ?- Maintain follow-up with psychiatry ?- Return for follow-up in 3 months ?- Contact us for any lingering low back issues so we can discuss next steps ?

## 2022-04-02 ENCOUNTER — Other Ambulatory Visit: Payer: Self-pay | Admitting: Family Medicine

## 2022-04-02 DIAGNOSIS — M5416 Radiculopathy, lumbar region: Secondary | ICD-10-CM

## 2022-04-02 NOTE — Telephone Encounter (Signed)
Requested Prescriptions  ?Pending Prescriptions Disp Refills  ?? celecoxib (CELEBREX) 200 MG capsule [Pharmacy Med Name: CELECOXIB 200MG CAPSULES] 60 capsule 2  ?  Sig: TAKE 1 TO 2 CAPSULES BY MOUTH DAILY AS NEEDED FOR PAIN  ?  ? Analgesics:  COX2 Inhibitors Failed - 04/02/2022  3:32 AM  ?  ?  Failed - Manual Review: Labs are only required if the patient has taken medication for more than 8 weeks.  ?  ?  Passed - HGB in normal range and within 360 days  ?  Hemoglobin  ?Date Value Ref Range Status  ?07/09/2021 15.7 13.0 - 17.0 g/dL Final  ?   ?  ?  Passed - Cr in normal range and within 360 days  ?  Creatinine, Ser  ?Date Value Ref Range Status  ?07/09/2021 1.13 0.61 - 1.24 mg/dL Final  ?   ?  ?  Passed - HCT in normal range and within 360 days  ?  HCT  ?Date Value Ref Range Status  ?07/09/2021 46.2 39.0 - 52.0 % Final  ?   ?  ?  Passed - AST in normal range and within 360 days  ?  AST  ?Date Value Ref Range Status  ?09/04/2021 24 0 - 40 IU/L Final  ?   ?  ?  Passed - ALT in normal range and within 360 days  ?  ALT  ?Date Value Ref Range Status  ?09/04/2021 24 0 - 44 IU/L Final  ?   ?  ?  Passed - eGFR is 30 or above and within 360 days  ?  GFR calc Af Amer  ?Date Value Ref Range Status  ?12/19/2016 >60 >60 mL/min Final  ?  Comment:  ?  (NOTE) ?The eGFR has been calculated using the CKD EPI equation. ?This calculation has not been validated in all clinical situations. ?eGFR's persistently <60 mL/min signify possible Chronic Kidney ?Disease. ?  ? ?GFR, Estimated  ?Date Value Ref Range Status  ?07/09/2021 >60 >60 mL/min Final  ?  Comment:  ?  (NOTE) ?Calculated using the CKD-EPI Creatinine Equation (2021) ?  ?   ?  ?  Passed - Patient is not pregnant  ?  ?  Passed - Valid encounter within last 12 months  ?  Recent Outpatient Visits   ?      ? Yesterday Other spondylosis with radiculopathy, lumbar region  ? [facility] Matthews, Jason J, MD  ? 4 weeks ago Left lumbar radiculopathy  ? [facility]  Matthews, Jason J, MD  ? 1 month ago Vasovagal syncope  ? [facility] Matthews, Jason J, MD  ? 4 months ago Anxiety and depression  ? [facility] Matthews, Jason J, MD  ? 6 months ago Cervical spondylosis  ? [facility] Matthews, Jason J, MD  ?  ?  ?Future Appointments   ?        ? In 3 months Matthews, Jason J, MD [facility]  ?  ? ?  ?  ?  ? ? ?

## 2022-04-06 ENCOUNTER — Encounter (HOSPITAL_COMMUNITY): Payer: Self-pay | Admitting: Psychiatry

## 2022-04-06 ENCOUNTER — Ambulatory Visit (INDEPENDENT_AMBULATORY_CARE_PROVIDER_SITE_OTHER): Payer: BC Managed Care – PPO | Admitting: Psychiatry

## 2022-04-06 VITALS — BP 126/80 | Temp 98.6°F | Ht 69.0 in | Wt 178.0 lb

## 2022-04-06 DIAGNOSIS — F419 Anxiety disorder, unspecified: Secondary | ICD-10-CM

## 2022-04-06 DIAGNOSIS — F331 Major depressive disorder, recurrent, moderate: Secondary | ICD-10-CM | POA: Diagnosis not present

## 2022-04-06 DIAGNOSIS — F411 Generalized anxiety disorder: Secondary | ICD-10-CM | POA: Diagnosis not present

## 2022-04-06 DIAGNOSIS — F4310 Post-traumatic stress disorder, unspecified: Secondary | ICD-10-CM

## 2022-04-06 DIAGNOSIS — F32A Depression, unspecified: Secondary | ICD-10-CM

## 2022-04-06 MED ORDER — PAROXETINE HCL ER 37.5 MG PO TB24: 37.0000 mg | ORAL_TABLET | Freq: Every day | ORAL | 0 refills | Status: DC
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
## 2022-05-13 ENCOUNTER — Telehealth (INDEPENDENT_AMBULATORY_CARE_PROVIDER_SITE_OTHER): Payer: BC Managed Care – PPO | Admitting: Psychiatry

## 2022-05-13 ENCOUNTER — Encounter (HOSPITAL_COMMUNITY): Payer: Self-pay | Admitting: Psychiatry

## 2022-05-13 DIAGNOSIS — F331 Major depressive disorder, recurrent, moderate: Secondary | ICD-10-CM | POA: Diagnosis not present

## 2022-05-13 DIAGNOSIS — F411 Generalized anxiety disorder: Secondary | ICD-10-CM

## 2022-05-13 DIAGNOSIS — F32A Depression, unspecified: Secondary | ICD-10-CM

## 2022-05-13 DIAGNOSIS — F4310 Post-traumatic stress disorder, unspecified: Secondary | ICD-10-CM | POA: Diagnosis not present

## 2022-05-13 DIAGNOSIS — F419 Anxiety disorder, unspecified: Secondary | ICD-10-CM | POA: Diagnosis not present

## 2022-05-13 MED ORDER — PAROXETINE HCL ER 37.5 MG PO TB24: 37.0000 mg | ORAL_TABLET | Freq: Every day | ORAL | 0 refills | Status: DC
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
## 2022-07-02 ENCOUNTER — Encounter: Payer: Self-pay | Admitting: Family Medicine

## 2022-07-02 ENCOUNTER — Ambulatory Visit (INDEPENDENT_AMBULATORY_CARE_PROVIDER_SITE_OTHER): Payer: BC Managed Care – PPO | Admitting: Family Medicine

## 2022-07-02 VITALS — BP 118/74 | HR 68 | Ht 68.0 in | Wt 183.0 lb

## 2022-07-02 DIAGNOSIS — G4700 Insomnia, unspecified: Secondary | ICD-10-CM

## 2022-07-02 DIAGNOSIS — S46002A Unspecified injury of muscle(s) and tendon(s) of the rotator cuff of left shoulder, initial encounter: Secondary | ICD-10-CM | POA: Diagnosis not present

## 2022-07-02 DIAGNOSIS — M4726 Other spondylosis with radiculopathy, lumbar region: Secondary | ICD-10-CM | POA: Diagnosis not present

## 2022-07-02 DIAGNOSIS — M7542 Impingement syndrome of left shoulder: Secondary | ICD-10-CM | POA: Insufficient documentation

## 2022-07-02 MED ORDER — QUVIVIQ 50 MG PO TABS: 50.0000 mg | ORAL_TABLET | Freq: Every evening | ORAL | 2 refills | Status: DC | PRN

## 2022-07-02 NOTE — Assessment & Plan Note (Signed)
>>  ASSESSMENT AND PLAN FOR INJURY OF MUSCLE OR TENDON OF LEFT ROTATOR CUFF WRITTEN ON 07/02/2022 11:36 AM BY Harry Williams, Harry Bob, MD  Patient with few month history of left diffuse shoulder pain without radiation, aggravated with truck driving, 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  Examination shows 4/5 strength with external rotation, this is painful, 5/5 strength with isolated supraspinatus testing, mildly painful, internal rotation benign, negative Neer's, positive Hawkins, nontender about the shoulder and neck, negative Spurling's, provocative testing otherwise is benign.  Clinical features are most consistent with left rotator cuff tendinopathy with impingement and focality to the external rotators, he has not been dosing Celebrex on a regular basis, from medication management standpoint have advised a 2-week scheduled course of this, following which she can doses as needed, home-based exercises, and recalcitrant symptoms can be addressed with corticosteroid injection, he will contact us to schedule.

## 2022-07-02 NOTE — Progress Notes (Signed)
Primary Care / Sports Medicine Office Visit  Patient Information:  Patient ID: Harry Williams, male DOB: 1981-09-17 Age: 41 y.o. MRN: [id number]   [name] is a pleasant 41 y.o. male presenting with the following:  Chief Complaint  Patient presents with   Insomnia    Never filled medication, thinks sleep is ok. Does want to try medication but is not ready yet.    Back Pain    States it is still painful but manageable, doesn't flare up as often.    Anxiety and depression    Does see PSY but not enough he states, is going to try and see him more.     Vitals:   07/02/22 0816  BP: 118/74  Pulse: 68  SpO2: 98%   Vitals:   07/02/22 0816  Weight: 183 lb (83 kg)  Height: 5\' 8"  (1.727 m)   Body mass index is 27.83 kg/m.  No results found.   Independent interpretation of notes and tests performed by another provider:   None  Procedures performed:   None  Pertinent History, Exam, Impression, and Recommendations:   Problem List Items Addressed This Visit       Nervous and Auditory   Other spondylosis with radiculopathy, lumbar region    Chronic, stable on current regimen      Relevant Medications   Daridorexant HCl (QUVIVIQ) 50 MG TABS     Musculoskeletal and Integument   Injury of muscle or tendon of left rotator cuff    Patient with few month history of left diffuse shoulder pain without radiation, aggravated with truck driving, 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  Examination shows 4/5 strength with external rotation, this is painful, 5/5 strength with isolated supraspinatus testing, mildly painful, internal rotation benign, negative Neer's, positive Hawkins, nontender about the shoulder and neck, negative Spurling's, provocative testing otherwise is benign.  Clinical features are most consistent with left rotator cuff tendinopathy with impingement and focality to the external rotators, he has not been dosing Celebrex on a regular basis, from  medication management standpoint have advised a 2-week scheduled course of this, following which she can doses as needed, home-based exercises, and recalcitrant symptoms can be addressed with corticosteroid injection, he will contact us to schedule.        Other   Insomnia - Primary    Chronic, still symptomatic, did not get a chance to pick up or start Quviviq.  Described the interplay between insomnia and comorbid mood conditions.  I also discussed the nature of Quviviq and how can be dosed as needed.  He is amenable to a trial of this medication on a as needed basis, new Rx placed today.  He will also continue to follow-up with psychiatry.      Relevant Medications   Daridorexant HCl (QUVIVIQ) 50 MG TABS     Orders & Medications Meds ordered this encounter  Medications   Daridorexant HCl (QUVIVIQ) 50 MG TABS    Sig: Take 50 mg by mouth at bedtime as needed.    Dispense:  30 tablet    Refill:  2   No orders of the defined types were placed in this encounter.    Return if symptoms worsen or fail to improve.     [name], MD   Primary Care Sports Medicine [facility]

## 2022-07-02 NOTE — Assessment & Plan Note (Signed)
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  Examination shows 4/5 strength with external rotation, this is painful, 5/5 strength with isolated supraspinatus testing, mildly painful, internal rotation benign, negative Neer's, positive Hawkins, nontender about the shoulder and neck, negative Spurling's, provocative testing otherwise is benign.  Clinical features are most consistent with left rotator cuff tendinopathy with impingement and focality to the external rotators, he has not been dosing Celebrex on a regular basis, from medication management standpoint have advised a 2-week scheduled course of this, following which she can doses as needed, home-based exercises, and recalcitrant symptoms can be addressed with corticosteroid injection, he will contact us to schedule.

## 2022-07-02 NOTE — Patient Instructions (Signed)
-   Dose Quviviq nightly as-needed (nights you have enough time to sleep) - Restart Celebrex twice daily x 2 weeks (take with food), after 2 weeks take as-needed - Start home exercises 3 days / week - Contact us in 2-4 weeks for any persistent shoulder issues, we will schedule follow-up then

## 2022-07-02 NOTE — Assessment & Plan Note (Signed)
Chronic, still symptomatic, did not get a chance to pick up or start Quviviq.  Described the interplay between insomnia and comorbid mood conditions.  I also discussed the nature of Quviviq and how can be dosed as needed.  He is amenable to a trial of this medication on a as needed basis, new Rx placed today.  He will also continue to follow-up with psychiatry.

## 2022-07-02 NOTE — Assessment & Plan Note (Signed)
Chronic, stable on current regimen.  

## 2022-07-05 ENCOUNTER — Telehealth: Payer: Self-pay

## 2022-07-05 NOTE — Telephone Encounter (Signed)
PA completed waiting on insurance approval.  Key: [id number]  KP

## 2022-07-07 NOTE — Telephone Encounter (Signed)
Denied  KP 

## 2022-07-22 ENCOUNTER — Encounter (HOSPITAL_COMMUNITY): Payer: Self-pay | Admitting: Psychiatry

## 2022-07-22 ENCOUNTER — Ambulatory Visit (INDEPENDENT_AMBULATORY_CARE_PROVIDER_SITE_OTHER): Payer: BC Managed Care – PPO | Admitting: Psychiatry

## 2022-07-22 VITALS — BP 118/82 | Ht 69.0 in | Wt 185.0 lb

## 2022-07-22 DIAGNOSIS — F419 Anxiety disorder, unspecified: Secondary | ICD-10-CM | POA: Diagnosis not present

## 2022-07-22 DIAGNOSIS — F331 Major depressive disorder, recurrent, moderate: Secondary | ICD-10-CM | POA: Diagnosis not present

## 2022-07-22 DIAGNOSIS — F411 Generalized anxiety disorder: Secondary | ICD-10-CM | POA: Diagnosis not present

## 2022-07-22 DIAGNOSIS — F32A Depression, unspecified: Secondary | ICD-10-CM | POA: Diagnosis not present

## 2022-07-22 MED ORDER — PAROXETINE HCL ER 25 MG PO TB24: 25.0000 mg | ORAL_TABLET | Freq: Every day | ORAL | 1 refills | Status: DC
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
## 2022-07-26 ENCOUNTER — Ambulatory Visit (INDEPENDENT_AMBULATORY_CARE_PROVIDER_SITE_OTHER): Payer: BC Managed Care – PPO | Admitting: Licensed Clinical Social Worker

## 2022-07-26 DIAGNOSIS — F331 Major depressive disorder, recurrent, moderate: Secondary | ICD-10-CM

## 2022-07-26 DIAGNOSIS — F4310 Post-traumatic stress disorder, unspecified: Secondary | ICD-10-CM | POA: Diagnosis not present

## 2022-07-26 DIAGNOSIS — F411 Generalized anxiety disorder: Secondary | ICD-10-CM | POA: Diagnosis not present

## 2022-07-26 DIAGNOSIS — F419 Anxiety disorder, unspecified: Secondary | ICD-10-CM
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
## 2022-09-02 ENCOUNTER — Ambulatory Visit (INDEPENDENT_AMBULATORY_CARE_PROVIDER_SITE_OTHER): Payer: BC Managed Care – PPO | Admitting: Psychiatry

## 2022-09-02 ENCOUNTER — Encounter (HOSPITAL_COMMUNITY): Payer: Self-pay | Admitting: Psychiatry

## 2022-09-02 VITALS — BP 118/80 | Temp 98.6°F | Ht 69.0 in | Wt 181.0 lb

## 2022-09-02 DIAGNOSIS — F4310 Post-traumatic stress disorder, unspecified: Secondary | ICD-10-CM | POA: Diagnosis not present

## 2022-09-02 DIAGNOSIS — F331 Major depressive disorder, recurrent, moderate: Secondary | ICD-10-CM

## 2022-09-02 DIAGNOSIS — F411 Generalized anxiety disorder: Secondary | ICD-10-CM

## 2022-09-02 MED ORDER — PAROXETINE HCL ER 12.5 MG PO TB24: 12.5000 mg | ORAL_TABLET | Freq: Every day | ORAL | 1 refills | Status: DC
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
## 2022-09-14 ENCOUNTER — Encounter (HOSPITAL_COMMUNITY): Payer: Self-pay

## 2022-09-14 ENCOUNTER — Ambulatory Visit (INDEPENDENT_AMBULATORY_CARE_PROVIDER_SITE_OTHER): Payer: BC Managed Care – PPO | Admitting: Licensed Clinical Social Worker

## 2022-09-14 DIAGNOSIS — F419 Anxiety disorder, unspecified: Secondary | ICD-10-CM

## 2022-09-14 DIAGNOSIS — F331 Major depressive disorder, recurrent, moderate: Secondary | ICD-10-CM

## 2022-09-14 DIAGNOSIS — F411 Generalized anxiety disorder: Secondary | ICD-10-CM | POA: Diagnosis not present

## 2022-09-14 DIAGNOSIS — F32A Depression, unspecified: Secondary | ICD-10-CM

## 2022-09-14 DIAGNOSIS — F4310 Post-traumatic stress disorder, unspecified: Secondary | ICD-10-CM

## 2022-09-14 NOTE — Progress Notes (Signed)
THERAPIST PROGRESS NOTE  Session Time: 1:00 PM to 2:00 PM  Participation Level: Active  Behavioral Response: CasualAlertappropriate  Type of Therapy: Individual Therapy  Treatment Goals addressed: PTSD, anxiety, depression relationship issues, coping  ProgressTowards Goals: Initial  Interventions: Solution Focused, Strength-based, Supportive, and Other: Education on trauma and depression  Summary: Harry Williams is a 41 y.o. male who presents with ok and check in hard to get going. Work 6 days  works overnight 6:30 PM 6-9 AM-14-16 hours. 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  He was driving long distance wanted to get in somewhere and not bounce around. But barely have time for kids, never mind looking for work.  Therapist provided her assessment that patient does not have any time for activities that would help him enjoy himself, activities that would alleviate depression. Patient says doesn't want to stay there. Once done with this company doesn't think want to do trucking anymore Heart is not into it. Patient can see as therapist pointed out that the company is wearing him down. Thought about different professions. He would love to move but doesn't have the time to figure it out for the next step.  Therapist guided patient in cutting back hours and patient said place he can do that.  He has tried to rock the boat. Might be the fall guy. Something has to be done. Feels inside himself not "poking the bear but right is right" and therapist agreed.  Discussed how work injury led to him seeking mental health.  He was inspecting his truck door closed on him and he was stuck. He was in a closed space and pounding on the door for somebody to let him out.  He was lucky somebody saw him otherwise it would have been another day for somebody to let him out.  Patient sees this as an Injury on top his depression, anxiety, PTSD. Look at work injury and they told  him he would have to find help on his own. Patient described that only has his small intestine sweating lead to deep hydration. A lot going on. They didn't do anything. Patient said couldn't perform for a couple days going through it. Tried to get them to see it and hear him out they said go through own insurance and this company not going to do that for him.   Therapist completed assessment was able to review current stressor related to work, trigger for seeking mental health treatment was his work, able to review through review through assessment trauma history helpful as we work on PTSD is one of the treatment goals.  Treatment plan in session patient gave verbal consent to complete in office therapist identified patient working some days some many hours significant source for depression that the first step in addressing depression is to be more active hard with patient schedule.  Advised to find activities that give him pleasure mastery value related activity.  It work itself a source of depression where incidents happen company ignores not willing to listen to input from workers so negative environment.  Educated patient on trauma will continue to do so explaining how trauma symptoms include negative emotion negative cognition and approach to trauma will include challenging distorted thoughts that are keeping him stuck in trauma symptoms.  Therapist provided space and support for patient to talk about thoughts and feelings in session. Suicidal/Homicidal: No  Plan: Return again in 2 weeks.2.  Have patient  complete trauma questionnaire, look at depression handbook, look at CPT look at therapist aid explanation for trauma  Diagnosis: Major depressive disorder, recurrent, moderate, generalized anxiety disorder, PTSD depression and anxiety  Collaboration of Care: Other none needed  Patient/Guardian was advised Release of Information must be obtained prior to any record release in order to collaborate  their care with an outside provider. Patient/Guardian was advised if they have not already done so to contact the registration department to sign all necessary forms in order for [name] to release information regarding their care.   Consent: Patient/Guardian gives verbal consent for treatment and assignment of benefits for services provided during this visit. Patient/Guardian expressed understanding and agreed to proceed.   Cordella Register, LCSW 09/14/2022

## 2022-09-14 NOTE — Plan of Care (Signed)
And participated in completion of treatment plan Problem: PTSD-Trauma Disorder CCP Problem  1  Goal:  Decrease Depression Outcome: Not Progressing Goal: Decrease anxiety Outcome: Not Progressing Goal: LTG: Reduce frequency, intensity, and duration of PTSD symptoms so daily functioning is improved: Input needed on appropriate metric.  Patient will score less than 15 on the trauma scale. Outcome: Not Progressing Goal: LTG: Enhanced ability to interact with others without suspicion or defensiveness: Input needed on appropriate metric Outcome: Not Progressing Goal: STG: Harry Williams WILL PRACTICE EMOTION REGULATION SKILLS 5 PER WEEK FOR THE NEXT 24 WEEKS Outcome: Not Progressing Goal: STG: Harry Williams WILL EXPERIENCE A 30% REDUCTION IN EXAGGERATED BELIEFS ABOUT SELF AND OTHERS THAT INTERFERE WITH TRAUMA RESOLUTION AS EVIDENCED BY SELF-REPORT Outcome: Not Progressing Goal: STG: Increase participation in previously avoided activities such as those identified by patient Outcome: Not Progressing

## 2022-09-28 ENCOUNTER — Ambulatory Visit (INDEPENDENT_AMBULATORY_CARE_PROVIDER_SITE_OTHER): Payer: BC Managed Care – PPO | Admitting: Licensed Clinical Social Worker

## 2022-09-28 DIAGNOSIS — F331 Major depressive disorder, recurrent, moderate: Secondary | ICD-10-CM

## 2022-09-28 DIAGNOSIS — F419 Anxiety disorder, unspecified: Secondary | ICD-10-CM | POA: Diagnosis not present

## 2022-09-28 DIAGNOSIS — F4310 Post-traumatic stress disorder, unspecified: Secondary | ICD-10-CM | POA: Diagnosis not present

## 2022-09-28 DIAGNOSIS — F411 Generalized anxiety disorder: Secondary | ICD-10-CM

## 2022-09-28 DIAGNOSIS — F32A Depression, unspecified: Secondary | ICD-10-CM

## 2022-09-28 NOTE — Progress Notes (Signed)
THERAPIST PROGRESS NOTE  Session Time: 1:00 PM to 1:50 PM  Participation Level: Active  Behavioral Response: CasualAlertEuthymic  Type of Therapy: Individual Therapy  Treatment Goals addressed: TSD, anxiety, depression relationship issues, coping  ProgressTowards Goals: Progressing-taught patient CBT strategies for depression as well as introducing relaxation for anxiety  Interventions: CBT, Solution Focused, Strength-based, Supportive, and Other: coping  Summary: Harry Williams is a 41 y.o. male who presents with patient said took Friday off and went to visit friend in [place] was their birthday.  He enjoyed himself therapist made the comment that would like to see patient doing more things he enjoys would help with his depression does not have to be that big even simple activities would be helpful but still a challenge because of little time he has free.  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  Reviewed session patient found helpful to learn relaxation to help his anxiety and panic he was thinking that he would start with challenging his thoughts now realizing he has to work on calming his body which will help decrease his anxiety and panic.  Therapist provided space and support for patient to talk about thoughts and feelings in session provided positive feedback for patient taking a small vacation noting therapist point one of the things that dries depression is when we do not engage in activities that we enjoy it, the give [name] a sense of meaning, where we are not connected to others.  Noted with patient's schedule very hard to do positive he was able to engage in activity enjoyed.  Reviewed website CBT self-help for depression for patient to have an idea of treatment interventions.  Noted we can intervene by challenging negative thoughts that are inaccurate or becoming  more active.  Noted the benefits of being active include increasing her activity and exercise levels can make an enormous impact on her mood as it stimulates the body to produce natural antidepressants.  Make [name] feel better about ourselves make [name] feel less tired, motivating [name] to do more, improving her ability to think more clearly helping [name] think about something other than focusing on our unhelpful thoughts, giving [name] a sense of achievement enjoyment, being with other people.  Helpful to do a's activities which give you a sense of achievement closeness to others enjoyment.  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  Noted we also can challenge thoughts that can be unhelpful and inaccurate asking whether they are fax her opinion noting thought distortions such as all or nothing thinking catastrophizing which are not accurate.  Therapist introduced model for anxiety which is fight/flight explained different bodily symptoms or preparing for [name] to protect herself.  Noted the first place to start and working on anxiety is with relaxation.  Note that it gets to the source of anxiety in the body and practicing relaxation will help with symptom decrease.  Noted is well an increase in the state of calm wellbeing that is increased with alpha brainwave frequency.  Did various ways to do him continue to teach patient different relaxations note good insight when he realizes in managing his  anxiety and panic needs to start with the body better insight after this in session about this Suicidal/Homicidal: No  Plan: Return again in 2 weeks.2.  Continue looking at relaxation, getting mobilized and automatic thoughts 3.  Patient complete trauma questionnaire  Diagnosis:  Major depressive disorder, recurrent, moderate, generalized anxiety disorder, PTSD depression and anxiety  Collaboration of Care: Other none needed  Patient/Guardian was advised Release of Information must be obtained prior to any record release in order to collaborate their care with an outside provider. Patient/Guardian was advised if they have not already done so to contact the registration department to sign all necessary forms in order for [name] to release information regarding their care.   Consent: Patient/Guardian gives verbal consent for treatment and assignment of benefits for services provided during this visit. Patient/Guardian expressed understanding and agreed to proceed.   Cordella Register, LCSW 09/28/2022

## 2022-10-10 ENCOUNTER — Encounter (HOSPITAL_COMMUNITY): Payer: Self-pay

## 2022-10-10 ENCOUNTER — Ambulatory Visit (HOSPITAL_COMMUNITY)
Admission: EM | Admit: 2022-10-10 | Discharge: 2022-10-10 | Disposition: A | Discharge status: Home / Self Care | Payer: BC Managed Care – PPO | Attending: Internal Medicine | Admitting: Internal Medicine

## 2022-10-10 DIAGNOSIS — Z23 Encounter for immunization: Secondary | ICD-10-CM

## 2022-10-10 DIAGNOSIS — S61411A Laceration without foreign body of right hand, initial encounter: Secondary | ICD-10-CM

## 2022-10-10 MED ORDER — IBUPROFEN 800 MG PO TABS
ORAL_TABLET | ORAL | Status: AC
  Filled 2022-10-10: qty 1

## 2022-10-10 MED ORDER — IBUPROFEN 800 MG PO TABS
800.0000 mg | ORAL_TABLET | Freq: Once | ORAL | Status: AC
  Administered 2022-10-10: 800 mg via ORAL

## 2022-10-10 MED ORDER — LIDOCAINE-EPINEPHRINE 1 %-1:100000 IJ SOLN
INTRAMUSCULAR | Status: AC
  Filled 2022-10-10: qty 1

## 2022-10-10 MED ORDER — TETANUS-DIPHTH-ACELL PERTUSSIS 5-2.5-18.5 LF-MCG/0.5 IM SUSY
0.5000 mL | PREFILLED_SYRINGE | Freq: Once | INTRAMUSCULAR | Status: AC
Start: 2022-10-10 — End: 2022-10-10
  Administered 2022-10-10: 0.5 mL via INTRAMUSCULAR

## 2022-10-10 MED ORDER — TETANUS-DIPHTH-ACELL PERTUSSIS 5-2.5-18.5 LF-MCG/0.5 IM SUSY
PREFILLED_SYRINGE | INTRAMUSCULAR | Status: AC
  Filled 2022-10-10: qty 0.5

## 2022-10-10 NOTE — Discharge Instructions (Signed)
Wound care: Please keep the area surrounding the wound/sutures clean and dry for the next 24 hours. After 24 hours, you may get the wound wet. Gently clean wound with antibacterial soap. Do not scrub wound. Cover the area with a nonstick bandage and change the bandage 2 times a day.   You should have the sutures removed in 10 days by your primary care provider or at urgent care. Return sooner than 10 days if you experience discharge from your laceration, redness around your laceration, warmth around your laceration, or fever.   We recommend you take 600mg ibuprofen every 6 hours or tylenol 650mg every 6 hours as needed for pain. If needed, you can alternate these medications so that you take one medication every 3 hours. For instance, at noon take ibuprofen, then at 3pm take tylenol, then at 6pm take ibuprofen.  

## 2022-10-10 NOTE — ED Triage Notes (Signed)
Patient came in with a cut to the right hand webbing between the thumb and pointer finger. Patient states he was using a knife to work on his clippers, this slipped and went into the hand. Patient able to bend, feel, and move all fingers in the right hand. Able to make a fist.   Not on any blood thinners, no history of bleeding disorders. Patient could not remember having a TDAP in the last 5 years.

## 2022-10-10 NOTE — ED Provider Notes (Signed)
[facility]    CSN: [id number] Arrival date & time: 10/10/22  1529      History   Chief Complaint Chief Complaint  Patient presents with   Extremity Laceration    Right Hand    HPI Harry Williams is a 41 y.o. male.   Patient presents urgent care for evaluation of laceration to the right hand in the webbing between the right thumb and the right pointer finger.  He was using a knife to work on his clippers when the knife slipped and cut his hand.  The wound has bled significantly since injury, bleeding controlled with pressure.  He is not on any blood thinners and has full sensation of the hand distal to injury.  Able to make a grip motion with normal strength.  Full range of motion of all fingers of the right hand and full range of motion of the right wrist.  Last tetanus was over 5 years ago.  He is not diabetic and does not have any medical conditions causing him to be immunocompromised or at risk for delayed wound healing.     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   Closed fracture of nasal bone with routine healing 12/24/2016   Depression    PTSD (post-traumatic stress disorder)     Patient Active Problem List   Diagnosis Date Noted   Other spondylosis with radiculopathy, lumbar region 07/02/2022   Injury of muscle or tendon of left rotator cuff 07/02/2022   Insomnia 04/01/2022   Left lumbar radiculopathy 03/04/2022   Vasovagal syncope 02/11/2022   Piriformis syndrome of left side 02/11/2022   Cervical spondylosis 11/13/2021   Annual physical exam 09/04/2021   Lumbar spondylolysis 07/24/2021   Anxiety and depression 07/24/2021   Adenomatous polyp 04/04/2015    Past Surgical History:  Procedure Laterality Date   APPENDECTOMY     COLON SURGERY  03/30/2015   JOINT REPLACEMENT  2008   partial right elbow; right femoral graft   Total abdominal proctocolectomy, J-pouch, ileal  pouch anal anastomosis  2016       Home Medications    Prior to Admission medications   Medication Sig Start Date End Date Taking? Authorizing Provider  celecoxib (CELEBREX) 200 MG capsule TAKE 1 TO 2 CAPSULES BY MOUTH DAILY AS NEEDED FOR PAIN 04/02/22  Yes Montel Culver, MD  Daridorexant HCl (QUVIVIQ) 50 MG TABS Take 50 mg by mouth at bedtime as needed. 07/02/22  Yes Montel Culver, MD  PARoxetine (PAXIL CR) 12.5 MG 24 hr tablet Take 1 tablet (12.5 mg total) by mouth daily. 09/02/22 09/02/23 Yes Merian Capron, MD  HYDROCORTISONE PO Take by mouth as needed.    [provider]    Family History Family History  Problem Relation Age of Onset   Arthritis Mother    Anemia Mother    Diabetes Father    Hypertension Brother    Asthma Daughter    Heart disease Paternal Grandmother    Cancer Paternal Grandfather     Social History Social History   Tobacco Use   Smoking status: Never   Smokeless tobacco: Never  Vaping Use   Vaping Use: Never used  Substance Use Topics   Alcohol use: Yes    Comment: Occasional   Drug use: Never     Allergies   Patient has no known allergies.   Review of Systems  Review of Systems Per HPI  Physical Exam Triage Vital Signs ED Triage Vitals  Enc Vitals Group     BP 10/10/22 1615 130/81     Pulse Rate 10/10/22 1615 69     Resp 10/10/22 1615 16     Temp 10/10/22 1615 98 F (36.7 C)     Temp Source 10/10/22 1615 Oral     SpO2 10/10/22 1615 98 %     Weight --      Height --      Head Circumference --      Peak Flow --      Pain Score 10/10/22 1614 4     Pain Loc --      Pain Edu? --      Excl. in [place]? --    No data found.  Updated Vital Signs BP 130/81 (BP Location: Left Arm)   Pulse 69   Temp 98 F (36.7 C) (Oral)   Resp 16   SpO2 98%   Visual Acuity Right Eye Distance:   Left Eye Distance:   Bilateral Distance:    Right Eye Near:   Left Eye Near:    Bilateral Near:     Physical Exam Vitals and nursing  note reviewed.  Constitutional:      Appearance: He is not ill-appearing or toxic-appearing.  HENT:     Head: Normocephalic and atraumatic.     Right Ear: Hearing and external ear normal.     Left Ear: Hearing and external ear normal.     Nose: Nose normal.     Mouth/Throat:     Lips: Pink.  Eyes:     General: Lids are normal. Vision grossly intact. Gaze aligned appropriately.     Extraocular Movements: Extraocular movements intact.     Conjunctiva/sclera: Conjunctivae normal.  Pulmonary:     Effort: Pulmonary effort is normal.  Musculoskeletal:     Right hand: Laceration and tenderness present. No swelling, deformity or bony tenderness. Normal range of motion. Normal strength. Normal sensation. There is no disruption of two-point discrimination. Normal capillary refill. Normal pulse.     Left hand: Normal.     Cervical back: Neck supple.     Comments: +2 right radial pulse, less than 3 capillary refill to the right side, sensation intact distal to laceration injury.  See image below for further detail of laceration.  Skin:    General: Skin is warm and dry.     Capillary Refill: Capillary refill takes less than 2 seconds.     Findings: Laceration present. No rash.     Comments: Laceration present to the webbing of the right hand between the first and second digit.  See image below for further detail.  Laceration is currently oozing slowly, pressure held, bleeding controlled with pressure.  Neurological:     General: No focal deficit present.     Mental Status: He is alert and oriented to person, place, and time. Mental status is at baseline.     Cranial Nerves: No dysarthria or facial asymmetry.  Psychiatric:        Mood and Affect: Mood normal.        Speech: Speech normal.        Behavior: Behavior normal.        Thought Content: Thought content normal.        Judgment: Judgment normal.      UC Treatments / Results  Labs (all labs ordered are listed, but only abnormal  results  are displayed) Labs Reviewed - No data to display  EKG   Radiology No results found.  Procedures Laceration Repair  Date/Time: 10/10/2022 4:30 PM  Performed by: Talbot Grumbling, FNP Authorized by: Talbot Grumbling, FNP   Consent:    Consent obtained:  Verbal   Consent given by:  Patient and spouse   Risks, benefits, and alternatives were discussed: yes     Risks discussed:  Infection, pain, retained foreign body, tendon damage, vascular damage, poor wound healing, poor cosmetic result, need for additional repair and nerve damage   Alternatives discussed:  No treatment Universal protocol:    Patient identity confirmed:  Verbally with patient Anesthesia:    Anesthesia method:  Local infiltration   Local anesthetic:  Lidocaine 1% WITH epi Laceration details:    Location:  Hand   Hand location:  R palm   Length (cm):  4   Depth (mm):  5 Exploration:    Wound exploration: entire depth of wound visualized     Wound extent: no foreign bodies/material noted   Treatment:    Area cleansed with:  Povidone-iodine and soap and water   Amount of cleaning:  Standard Skin repair:    Repair method:  Sutures   Suture size:  4-0   Suture material:  Nylon   Suture technique:  Simple interrupted   Number of sutures:  4 Approximation:    Approximation:  Close Repair type:    Repair type:  Simple Post-procedure details:    Dressing:  Non-adherent dressing   Procedure completion:  Tolerated well, no immediate complications  (including critical care time)  Medications Ordered in UC Medications  Tdap (BOOSTRIX) injection 0.5 mL (0.5 mLs Intramuscular Given 10/10/22 1642)  ibuprofen (ADVIL) tablet 800 mg (800 mg Oral Given 10/10/22 1741)    Initial Impression / Assessment and Plan / UC Course  I have reviewed the triage vital signs and the nursing notes.  Pertinent labs & imaging results that were available during my care of the patient were reviewed by me and  considered in my medical decision making (see chart for details).   1.  Laceration of right hand without foreign body  Laceration to the right hand repaired. See procedure note above for details. Wound cleansed and dressed with nonstick dressing in clinic. Patient instructed to keep wound dry for 24 hours, then they may clean it with antibacterial soap and water gently. Advised not to scrub or rub site to avoid causing the sutures to separate. Dressing changes 2 times daily with nonstick dressing. No ointments, lotions, or powders to the site until site heals. Suture removal in 10 days. Advised to monitor site for signs of infection (redness, swelling, pus, pain) and return to UC sooner than suture removal if infected.  Tylenol/ibuprofen may be used every 6 hours as needed for pain once numbing wears off.  Dose of ibuprofen given in clinic today to help with soreness and swelling.  Advised to rest hand and avoid exposure to potentially infectious environment. Encouraged to avoid activities that increase tension to the wound/sutures.  Tetanus updated today.  Discussed physical exam and available lab work findings in clinic with patient.  Counseled patient regarding appropriate use of medications and potential side effects for all medications recommended or prescribed today. Discussed red flag signs and symptoms of worsening condition,when to call the PCP office, return to urgent care, and when to seek higher level of care in the emergency department. Patient verbalizes understanding and agreement with  plan. All questions answered. Patient discharged in stable condition.    Final Clinical Impressions(s) / UC Diagnoses   Final diagnoses:  Laceration of right hand, foreign body presence unspecified, initial encounter     Discharge Instructions      Wound care: Please keep the area surrounding the wound/sutures clean and dry for the next 24 hours. After 24 hours, you may get the wound wet. Gently  clean wound with antibacterial soap. Do not scrub wound. Cover the area with a nonstick bandage and change the bandage 2 times a day.   You should have the sutures removed in 10 days by your primary care provider or at urgent care. Return sooner than 10 days if you experience discharge from your laceration, redness around your laceration, warmth around your laceration, or fever.   We recommend you take '600mg'$  ibuprofen every 6 hours or tylenol '650mg'$  every 6 hours as needed for pain. If needed, you can alternate these medications so that you take one medication every 3 hours. For instance, at noon take ibuprofen, then at 3pm take tylenol, then at 6pm take ibuprofen.      ED Prescriptions   None    PDMP not reviewed this encounter.   Talbot Grumbling, [place] 10/10/22 2048

## 2022-10-12 ENCOUNTER — Ambulatory Visit (INDEPENDENT_AMBULATORY_CARE_PROVIDER_SITE_OTHER): Payer: BC Managed Care – PPO | Admitting: Licensed Clinical Social Worker

## 2022-10-12 DIAGNOSIS — F4310 Post-traumatic stress disorder, unspecified: Secondary | ICD-10-CM

## 2022-10-12 DIAGNOSIS — F419 Anxiety disorder, unspecified: Secondary | ICD-10-CM

## 2022-10-12 DIAGNOSIS — F32A Depression, unspecified: Secondary | ICD-10-CM

## 2022-10-12 DIAGNOSIS — F411 Generalized anxiety disorder: Secondary | ICD-10-CM | POA: Diagnosis not present

## 2022-10-12 DIAGNOSIS — F331 Major depressive disorder, recurrent, moderate: Secondary | ICD-10-CM | POA: Diagnosis not present

## 2022-10-12 NOTE — Progress Notes (Signed)
THERAPIST PROGRESS NOTE  Session Time: 1:00 PM to 1:50 PM  Participation Level: Active  Behavioral Response: CasualAlertEuthymic  Type of Therapy: Individual Therapy  Treatment Goals addressed: PTSD, anxiety, depression relationship issues, coping  ProgressTowards Goals: Progressing-work with patient on coping strategies using CBT for anxiety  Interventions: CBT, Solution Focused, Strength-based, Supportive, and Other: Coping  Summary: Harry Williams is a 41 y.o. male who presents got a deep cut from fixing a total had 7 stitches and he is all right.  Continued with psychoeducation for patient about anxiety therapist explaining CBT concept that anxiety is caused by catastrophic thoughts fortune telling the gets the fight flight going things such as rapid heartbeat, rapid breathing sweating explained to patient this is all in the service of protecting [name] to run or fight.  Often though its not needed its harmless it is a misinterpretation of dangerousness.  We can intervene by challenging her thoughts, using relaxation to calm down her autonomic nervous system, engage in behaviors that do not avoid.  Patient explains with him body wants to curl down and lay down on feet but body wants to curl up and rest.  Therapist assessed this as a typical reaction to fighter flight where her body wants to shut down as a way to protect ourself.  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  Therapist introduced concept of anxiety is not to be trusted it is a false alarm needs to be tested factually what his opinion and what is fact.  Therapist also said trauma is extreme form of anxiety the trigger comes from the past is the difference but we are activated the same way  with the autonomic nervous system so relaxation helps as well with trauma as well as emotional regulation strategies to help calm this down.  Introduced other strategies for generalized anxiety including Asking if questions to get out of the loop of anxiety including as this fear really about you?  Is thinking about this necessary are important right now?  What is the truth about you when the situation?  Do you have a covered already?  Is this a real problem that worries bugging you about unnecessary things?  Do a side-by-side comparison what worry says based on what the facts are to out smart the worry brain trick.  Introduced the decision tree to help you noticed real problem versus hypothetical worry again asking questions is this a problem I can do something about?  If not let the worry go and focused on something else that is important to right now if you can do something work out what you could do list your options is there anything you can do right now if yes do it if not plan what you could do and when you will do it.  Then let the worry go and focused on something else that is important to right now. Introduced well compassionate thought challenge record where you noticed the automatic thoughts and you replace it with something more compassionate and helpful.  Therapist provided space and support for patient to talk about thoughts and feelings in session  Suicidal/Homicidal: No  Plan: Return again in 2 weeks.2.  Look at ruminating thoughts book, look at CPT for trauma, review  PCL-5  Diagnosis: Major depressive disorder, recurrent, moderate, generalized anxiety disorder, PTSD depression and anxiety   Collaboration of Care: Other none needed  Patient/Guardian was advised Release of Information must be obtained prior to any record release in order to collaborate their care with an outside provider. Patient/Guardian was advised if they have not already done so to contact the registration department  to sign all necessary forms in order for [name] to release information regarding their care.   Consent: Patient/Guardian gives verbal consent for treatment and assignment of benefits for services provided during this visit. Patient/Guardian expressed understanding and agreed to proceed.   Cordella Register, LCSW 10/12/2022

## 2022-10-26 ENCOUNTER — Ambulatory Visit (INDEPENDENT_AMBULATORY_CARE_PROVIDER_SITE_OTHER): Payer: BC Managed Care – PPO | Admitting: Licensed Clinical Social Worker

## 2022-10-26 DIAGNOSIS — F4310 Post-traumatic stress disorder, unspecified: Secondary | ICD-10-CM | POA: Diagnosis not present

## 2022-10-26 DIAGNOSIS — F411 Generalized anxiety disorder: Secondary | ICD-10-CM

## 2022-10-26 DIAGNOSIS — F419 Anxiety disorder, unspecified: Secondary | ICD-10-CM

## 2022-10-26 DIAGNOSIS — F331 Major depressive disorder, recurrent, moderate: Secondary | ICD-10-CM

## 2022-10-26 DIAGNOSIS — F32A Depression, unspecified: Secondary | ICD-10-CM
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
## 2022-10-30 ENCOUNTER — Other Ambulatory Visit: Payer: Self-pay

## 2022-10-30 ENCOUNTER — Ambulatory Visit (HOSPITAL_COMMUNITY)
Admission: EM | Admit: 2022-10-30 | Discharge: 2022-10-30 | Disposition: A | Discharge status: Home / Self Care | Payer: BC Managed Care – PPO

## 2022-10-30 ENCOUNTER — Encounter (HOSPITAL_COMMUNITY): Payer: Self-pay | Admitting: Emergency Medicine

## 2022-10-30 DIAGNOSIS — Z5189 Encounter for other specified aftercare: Secondary | ICD-10-CM

## 2022-10-30 DIAGNOSIS — Z4802 Encounter for removal of sutures: Secondary | ICD-10-CM | POA: Diagnosis not present

## 2022-10-30 DIAGNOSIS — S61011D Laceration without foreign body of right thumb without damage to nail, subsequent encounter: Secondary | ICD-10-CM | POA: Diagnosis not present

## 2022-10-30 NOTE — ED Triage Notes (Signed)
Pt here for suture removal to right hand; wound healed

## 2022-10-30 NOTE — ED Provider Notes (Signed)
Here for suture removal. This provider was asked by nurse to view wound after sutures removed. No signs of infection. Full ROM of thumb. Some pain lingering but no sign of abscess, erythema, warmth, drainage. Discussed symptomatic care, red flag symptoms to watch for.   Harry Williams, [place] Gu[place], [place] 10/30/22 1749

## 2022-10-30 NOTE — Discharge Instructions (Addendum)
Keep the area clean with soap and water. You can apply cocoa butter to the area to help soften the skin Ibuprofen can be used for lingering pain or swelling  Watch for any signs of infection and return as needed

## 2022-11-02 ENCOUNTER — Ambulatory Visit (INDEPENDENT_AMBULATORY_CARE_PROVIDER_SITE_OTHER): Payer: BC Managed Care – PPO | Admitting: Psychiatry

## 2022-11-02 ENCOUNTER — Encounter (HOSPITAL_COMMUNITY): Payer: Self-pay | Admitting: Psychiatry

## 2022-11-02 VITALS — BP 131/80 | HR 70 | Ht 68.0 in | Wt 180.0 lb

## 2022-11-02 DIAGNOSIS — F411 Generalized anxiety disorder: Secondary | ICD-10-CM

## 2022-11-02 DIAGNOSIS — F4310 Post-traumatic stress disorder, unspecified: Secondary | ICD-10-CM

## 2022-11-02 DIAGNOSIS — F331 Major depressive disorder, recurrent, moderate: Secondary | ICD-10-CM
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
## 2022-11-04 ENCOUNTER — Ambulatory Visit
Admission: RE | Admit: 2022-11-04 | Discharge: 2022-11-04 | Disposition: A | Discharge status: Home / Self Care | Payer: BC Managed Care – PPO | Attending: Family Medicine | Admitting: Family Medicine

## 2022-11-04 ENCOUNTER — Inpatient Hospital Stay (INDEPENDENT_AMBULATORY_CARE_PROVIDER_SITE_OTHER): Payer: BC Managed Care – PPO | Admitting: Radiology

## 2022-11-04 ENCOUNTER — Ambulatory Visit (INDEPENDENT_AMBULATORY_CARE_PROVIDER_SITE_OTHER): Payer: BC Managed Care – PPO | Admitting: Family Medicine

## 2022-11-04 ENCOUNTER — Encounter: Payer: Self-pay | Admitting: Family Medicine

## 2022-11-04 ENCOUNTER — Ambulatory Visit
Admission: RE | Admit: 2022-11-04 | Discharge: 2022-11-04 | Disposition: A | Discharge status: Home / Self Care | Payer: BC Managed Care – PPO | Source: Ambulatory Visit | Attending: Family Medicine | Admitting: Family Medicine

## 2022-11-04 VITALS — BP 120/78 | HR 78 | Ht 69.0 in | Wt 182.0 lb

## 2022-11-04 DIAGNOSIS — M7542 Impingement syndrome of left shoulder: Secondary | ICD-10-CM | POA: Diagnosis present

## 2022-11-04 MED ORDER — TRIAMCINOLONE ACETONIDE 40 MG/ML IJ SUSP
40.0000 mg | Freq: Once | INTRAMUSCULAR | Status: AC
  Administered 2022-11-04: 40 mg via INTRAMUSCULAR

## 2022-11-04 NOTE — Assessment & Plan Note (Signed)
Chronic condition in the setting of comorbid cervical spondylosis, heavy baseline work-related activity. Exam shows focality to the supraspinatus where there is 5-/5 strength and pain, ER 5/5 strength, painful, IR benign, +impingement, otherwise negative provocative testing. He has been dosing Celebrex with limited response.  Given treatments thus far, nature of physical demands, he did elect to proceed with ultrasound-guided corticosteroid injection to the left subacromial space. Will start home-based rehab from [facility] shoulder conditioning program, follow-up in 6 weeks for reevaluation. Suboptimal progress to be assessed with advanced imaging.

## 2022-11-04 NOTE — Progress Notes (Signed)
Primary Care / Sports Medicine Office Visit  Patient Information:  Patient ID: Harry Williams, male DOB: 11-07-1981 Age: 41 y.o. MRN: [id number]   Harry Williams is a pleasant 41 y.o. male presenting with the following:  Chief Complaint  Patient presents with   Shoulder Pain    Left, states it has been getting worst for a long time. Never got any better    Vitals:   11/04/22 0943  BP: 120/78  Pulse: 78  SpO2: 99%   Vitals:   11/04/22 0943  Weight: 182 lb (82.6 kg)  Height: 5\' 9"  (1.753 m)   Body mass index is 26.88 kg/m.  No results found.   Independent interpretation of notes and tests performed by another provider:   None  Procedures performed:   Procedure:  Injection of left subacromial space under ultrasound guidance. Ultrasound guidance utilized for subacromial injection, in-plane approach, no discreet tendinopathy noted Samsung HS60 device utilized with permanent recording / reporting. Verbal informed consent obtained and verified. Skin prepped in a sterile fashion. Ethyl chloride for topical local analgesia.  Completed without difficulty and tolerated well. Medication: triamcinolone acetonide 40 mg/mL suspension for injection 1 mL total and 2 mL lidocaine 1% without epinephrine utilized for needle placement anesthetic Advised to contact for fevers/chills, erythema, induration, drainage, or persistent bleeding.   Pertinent History, Exam, Impression, and Recommendations:   Problem List Items Addressed This Visit       Musculoskeletal and Integument   Rotator cuff impingement syndrome, left - Primary    Chronic condition in the setting of comorbid cervical spondylosis, heavy baseline work-related activity. Exam shows focality to the supraspinatus where there is 5-/5 strength and pain, ER 5/5 strength, painful, IR benign, +impingement, otherwise negative provocative testing. He has been dosing Celebrex with limited response.  Given treatments thus far,  nature of physical demands, he did elect to proceed with ultrasound-guided corticosteroid injection to the left subacromial space. Will start home-based rehab from [facility] shoulder conditioning program, follow-up in 6 weeks for reevaluation. Suboptimal progress to be assessed with advanced imaging.      Relevant Orders   DG Shoulder Left   [name] LIMITED JOINT SPACE STRUCTURES UP LEFT     Orders & Medications Meds ordered this encounter  Medications   triamcinolone acetonide (KENALOG-40) injection 40 mg   Orders Placed This Encounter  Procedures   DG Shoulder Left   [name] LIMITED JOINT SPACE STRUCTURES UP LEFT     Return in about 6 weeks (around 12/16/2022).     [name], MD, [facility]   Primary Care Sports Medicine Primary Care and Sports Medicine at [facility]

## 2022-11-04 NOTE — Patient Instructions (Addendum)
You have just been given a cortisone injection to reduce pain and inflammation. After the injection you may notice immediate relief of pain as a result of the Lidocaine. It is important to rest the area of the injection for 24 to 48 hours after the injection. There is a possibility of some temporary increased discomfort and swelling for up to 72 hours until the cortisone begins to work. If you do have pain, simply rest the joint and use ice. If you can tolerate over the counter medications, you can try Tylenol for added relief per package instructions. - Take Celebrex until symptoms respond to cortisone - Start home exercises - Return in 6 weeks

## 2022-11-09 ENCOUNTER — Ambulatory Visit (INDEPENDENT_AMBULATORY_CARE_PROVIDER_SITE_OTHER): Payer: BC Managed Care – PPO | Admitting: Licensed Clinical Social Worker

## 2022-11-09 DIAGNOSIS — F4310 Post-traumatic stress disorder, unspecified: Secondary | ICD-10-CM | POA: Diagnosis not present

## 2022-11-09 DIAGNOSIS — F419 Anxiety disorder, unspecified: Secondary | ICD-10-CM | POA: Diagnosis not present

## 2022-11-09 DIAGNOSIS — F331 Major depressive disorder, recurrent, moderate: Secondary | ICD-10-CM

## 2022-11-09 DIAGNOSIS — F411 Generalized anxiety disorder: Secondary | ICD-10-CM | POA: Diagnosis not present

## 2022-11-09 DIAGNOSIS — F32A Depression, unspecified: Secondary | ICD-10-CM

## 2022-11-09 NOTE — Progress Notes (Signed)
THERAPIST PROGRESS NOTE  Session Time: 1:00 PM to 1:50 PM  Participation Level: Active  Behavioral Response: CasualAlertDysphoric  Type of Therapy: Individual Therapy  Treatment Goals addressed: PTSD, anxiety, depression relationship issues, coping ProgressTowards Goals: Progressing  Interventions: Solution Focused, Strength-based, Supportive, and Other: coping  Summary: Harry Williams is a 41 y.o. male who therapist asked about current stressors and he relates main stressor is work. Last week frustration and essentially what therapist describes as the run around by management. Want him to do the job and just take whatever they say is  the biggest stressor. Don't have managers can go to definitely don't have anybody advocating for him.  This noted underlying issue of not having control source for emotions patient says that is true does not have control.  Noted at the same time difficulty of changing situation given limited time to do that when he is not working. He wishes he had more time to look for something. He thinks people get tired of his complaining about situation but not easy to quit a situation when family bills.  Also could just be in his mind for example with his wife but feels she probably is getting tired of hearing about it.  This job is not the end of the road but what has now. Needs his own time to research something else but doesn't have it.Marland KitchenHe was singled out and patient tried to explain has been at the company longer so has seniority and management hated that he uses that word. Managers not owning policy they are implementing that is not fair and punishing somebody if they speak up about it.They don't won it and patient singled out and blamed at meeting. Said patient caused new policy to happen but Freight forwarder had changed the policy. He had realized it was true and so went and switched it. Because of patient's discussion about his situation trickled down to other workers.  Knows it  does not help to speak up but have to speak up to advocate for himself for example when losing pay.  Describes other issues such as the company doesn't care if unsafe if say something then deduct pay or a route when don't pay. They say they put safety first but don't only care about hauling freight. Tell the truth penalty don't want him to tell the truth. Documenting and advocating for hor himself doesn't have time to look for somebody to advocate for him. Something triggering him to get work on time because the thought not wanted. It is everyday hard to go to work. Not there to help him made him feel like the bad guy. Just goes and does job stay in own lane to cope. When addresses issues he is singled out for it. Tells himself keep smiling done that for a lont time.  Therapist noted that actually helps do not appear resistant as well as persistent being very helpful.      Therapist encouraged patient to talk about current stressors as processing thoughts and feelings help with coping.  Noted work stressor is most significant 1 and therapist validated patient when heard more of his narrative that it is a corrupt system and when you speak up for yourself you are punished more even to the point of being singled out to other workers look like the bad guy when he has nothing to do with switch of policy the same time being penalized when he has and you already and when he has worked to get further clearances  for his license.  Therapist provided positive feedback for patient documented everything as there seems to be actionable causes for things like not caring about safety of equipment.  Noted plan continues to be finding a better work situation but also the challenges where he does not have very much time to do this.  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  Did recovery also as  uncovering urinate potential that may be as yet unrealized.  Therapist noted her life experience persistence has been key to overcoming obstacles.  Therapist also talked about developmental arrests working on those areas in [place] also helpful for better coping areas where this may have occurred that we will help with coping things like self compassion, self protection and ability to relax therapist that in general areas work on with people to facilitate growth.  Therapist provided space and support for patient to talk about thoughts and feelings in session Suicidal/Homicidal: No  Plan: Return again in 3 weeks.2.  Look at complex PTSD book for strategies for emotional regulation with trauma  Diagnosis: Major depressive disorder, recurrent, moderate, generalized anxiety disorder, PTSD depression and anxiety  Collaboration of Care: Medication Management AEB Dr. De Nurse  Patient/Guardian was advised Release of Information must be obtained prior to any record release in order to collaborate their care with an outside provider. Patient/Guardian was advised if they have not already done so to contact the registration department to sign all necessary forms in order for [name] to release information regarding their care.   Consent: Patient/Guardian gives verbal consent for treatment and assignment of benefits for services provided during this visit. Patient/Guardian expressed understanding and agreed to proceed.   Cordella Register, LCSW 11/09/2022

## 2022-11-26 ENCOUNTER — Other Ambulatory Visit: Payer: Self-pay

## 2022-11-26 ENCOUNTER — Encounter: Payer: Self-pay | Admitting: Family Medicine

## 2022-11-26 DIAGNOSIS — K921 Melena: Secondary | ICD-10-CM

## 2022-11-26 NOTE — Telephone Encounter (Signed)
Can referral be placed?  KP

## 2022-11-30 ENCOUNTER — Ambulatory Visit (INDEPENDENT_AMBULATORY_CARE_PROVIDER_SITE_OTHER): Payer: BC Managed Care – PPO | Admitting: Licensed Clinical Social Worker

## 2022-11-30 DIAGNOSIS — F411 Generalized anxiety disorder: Secondary | ICD-10-CM | POA: Diagnosis not present

## 2022-11-30 DIAGNOSIS — F419 Anxiety disorder, unspecified: Secondary | ICD-10-CM | POA: Diagnosis not present

## 2022-11-30 DIAGNOSIS — F331 Major depressive disorder, recurrent, moderate: Secondary | ICD-10-CM

## 2022-11-30 DIAGNOSIS — F4310 Post-traumatic stress disorder, unspecified: Secondary | ICD-10-CM

## 2022-11-30 DIAGNOSIS — F32A Depression, unspecified: Secondary | ICD-10-CM
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
## 2022-12-03 ENCOUNTER — Telehealth: Payer: Self-pay | Admitting: Family Medicine

## 2022-12-03 NOTE — Telephone Encounter (Signed)
Copied from [place] [id number]. Topic: Referral - Status >> Dec 03, 2022  8:01 AM Chapman Fitch wrote: Reason for CRM: [facility] medicine needs the OV notes for bloody stool and anything pertaining to that (colonoscopy, etc) / please advise asap and fax# [phone]

## 2022-12-07 NOTE — Telephone Encounter (Signed)
This was done through [facility], pt request to go back to DR he saw prior. We have no notes.

## 2022-12-14 ENCOUNTER — Ambulatory Visit (INDEPENDENT_AMBULATORY_CARE_PROVIDER_SITE_OTHER): Payer: BC Managed Care – PPO | Admitting: Licensed Clinical Social Worker

## 2022-12-14 DIAGNOSIS — F411 Generalized anxiety disorder: Secondary | ICD-10-CM | POA: Diagnosis not present

## 2022-12-14 DIAGNOSIS — F4310 Post-traumatic stress disorder, unspecified: Secondary | ICD-10-CM | POA: Diagnosis not present

## 2022-12-14 DIAGNOSIS — F331 Major depressive disorder, recurrent, moderate: Secondary | ICD-10-CM | POA: Diagnosis not present

## 2022-12-14 DIAGNOSIS — F32A Depression, unspecified: Secondary | ICD-10-CM

## 2022-12-14 DIAGNOSIS — F419 Anxiety disorder, unspecified: Secondary | ICD-10-CM
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
## 2022-12-16 ENCOUNTER — Ambulatory Visit (INDEPENDENT_AMBULATORY_CARE_PROVIDER_SITE_OTHER): Payer: BC Managed Care – PPO | Admitting: Family Medicine

## 2022-12-16 ENCOUNTER — Encounter: Payer: Self-pay | Admitting: Family Medicine

## 2022-12-16 VITALS — BP 120/76 | HR 70 | Ht 69.0 in

## 2022-12-16 DIAGNOSIS — K625 Hemorrhage of anus and rectum: Secondary | ICD-10-CM | POA: Insufficient documentation

## 2022-12-16 DIAGNOSIS — M7542 Impingement syndrome of left shoulder: Secondary | ICD-10-CM | POA: Diagnosis not present

## 2022-12-16 HISTORY — DX: Hemorrhage of anus and rectum: K62.5

## 2022-12-16 MED ORDER — HYDROCORTISONE 2.5 % EX CREA: TOPICAL_CREAM | Freq: Two times a day (BID) | CUTANEOUS | 0 refills | Status: DC

## 2022-12-16 NOTE — Assessment & Plan Note (Signed)
Chronic, in the setting of comorbid cervical spondylosis.  At last visit on 11/04/2022 he received a subacromial corticosteroid injection.  He relays near total symptom resolution following this, has not had a chance to start home-based rehab.  Examination with 5/5 strength throughout the rotator cuff without pain, negative impingement, remaining provocative testing benign.  Given his excellent response I have encouraged patient to start home exercises to maintain symptom control, can follow-up as needed on this issue.

## 2022-12-16 NOTE — Assessment & Plan Note (Signed)
Acute on chronic issue with progressive worsening over the past month and more so noticeable over the past 2 weeks.  He describes painless passage of bright red blood per rectum, small amount noted on toilet paper, does have a history of colon surgery in 2016 (Total abdominal proctocolectomy, J-pouch, ileal pouch anal anastomosis) but denies any comorbid abdominal pain, dark-colored stool.  Examination with normoactive bowel sounds, soft, nontender, nondistended abdomen without hepatosplenomegaly, rectal exam without overt external hemorrhoids noted.  Patient's clinical history and findings raise primary concern for internal hemorrhoids, topical steroid and supportive care/patient information provided.  A previous referral to GI had been placed but he has not heard anything in that regard.  The phone number for group was provided to the patient, we will follow peripherally on this issue.

## 2022-12-16 NOTE — Progress Notes (Signed)
Primary Care / Sports Medicine Office Visit  Patient Information:  Patient ID: Harry Williams, male DOB: 31-Jan-1981 Age: 42 y.o. MRN: [id number]   Harry Williams is a pleasant 42 y.o. male presenting with the following:  Chief Complaint  Patient presents with   Rotator cuff impingement syndrome, left    Feeling better   Rectal Bleeding    1 month, every day last 2 weeks    Vitals:   12/16/22 0938  BP: 120/76  Pulse: 70  SpO2: 98%   Vitals:   12/16/22 0938  Height: 5\' 9"  (1.753 m)   Body mass index is 26.88 kg/m.  No results found.   Independent interpretation of notes and tests performed by another provider:   None  Procedures performed:   None  Pertinent History, Exam, Impression, and Recommendations:   Harry Williams was seen today for rotator cuff impingement syndrome, left and rectal bleeding.  Bright red blood per rectum Assessment & Plan: Acute on chronic issue with progressive worsening over the past month and more so noticeable over the past 2 weeks.  He describes painless passage of bright red blood per rectum, small amount noted on toilet paper, does have a history of colon surgery in 2016 (Total abdominal proctocolectomy, J-pouch, ileal pouch anal anastomosis) but denies any comorbid abdominal pain, dark-colored stool.  Examination with normoactive bowel sounds, soft, nontender, nondistended abdomen without hepatosplenomegaly, rectal exam without overt external hemorrhoids noted.  Patient's clinical history and findings raise primary concern for internal hemorrhoids, topical steroid and supportive care/patient information provided.  A previous referral to GI had been placed but he has not heard anything in that regard.  The phone number for group was provided to the patient, we will follow peripherally on this issue.  Orders: -     Hydrocortisone; Apply topically 2 (two) times daily. Do not exceed 7 days of use  Dispense: 30 g; Refill: 0  Rotator cuff  impingement syndrome, left Assessment & Plan: Chronic, in the setting of comorbid cervical spondylosis.  At last visit on 11/04/2022 he received a subacromial corticosteroid injection.  He relays near total symptom resolution following this, has not had a chance to start home-based rehab.  Examination with 5/5 strength throughout the rotator cuff without pain, negative impingement, remaining provocative testing benign.  Given his excellent response I have encouraged patient to start home exercises to maintain symptom control, can follow-up as needed on this issue.      Orders & Medications Meds ordered this encounter  Medications   hydrocortisone 2.5 % cream    Sig: Apply topically 2 (two) times daily. Do not exceed 7 days of use    Dispense:  30 g    Refill:  0   No orders of the defined types were placed in this encounter.    Return in about 6 months (around 06/16/2023) for CPE.     [name], MD, [facility]   Primary Care Sports Medicine Primary Care and Sports Medicine at [facility]

## 2022-12-16 NOTE — Patient Instructions (Addendum)
-  Perform home exercises for shoulder and continue on a regular basis to keep shoulder symptoms at bay - Use topical steroid twice daily until symptoms resolve, do not exceed 7 days of continuous usage - Contact number below to schedule with [facility] gastroenterology - Return in 6 months for annual physical  [facility] [phone]

## 2022-12-27 ENCOUNTER — Ambulatory Visit (HOSPITAL_COMMUNITY): Payer: BC Managed Care – PPO | Admitting: Licensed Clinical Social Worker

## 2022-12-27 NOTE — Progress Notes (Signed)
Patient did not show for appointment.   

## 2022-12-28 ENCOUNTER — Telehealth: Payer: Self-pay | Admitting: Family Medicine

## 2022-12-28 NOTE — Telephone Encounter (Signed)
Copied from [place] [id number]. Topic: General - Other >> Dec 28, 2022 11:51 AM Sabas Sous wrote: Reason for CRM: Please send notes and recent labs that were done (Colonscopy)   Fax: [phone] Best contact: [phone] Option 1 (Crystalene from [facility])

## 2022-12-29 NOTE — Telephone Encounter (Signed)
faxed

## 2023-01-10 ENCOUNTER — Ambulatory Visit (INDEPENDENT_AMBULATORY_CARE_PROVIDER_SITE_OTHER): Payer: BC Managed Care – PPO | Admitting: Licensed Clinical Social Worker

## 2023-01-10 DIAGNOSIS — F4310 Post-traumatic stress disorder, unspecified: Secondary | ICD-10-CM

## 2023-01-10 DIAGNOSIS — F32A Depression, unspecified: Secondary | ICD-10-CM

## 2023-01-10 DIAGNOSIS — F331 Major depressive disorder, recurrent, moderate: Secondary | ICD-10-CM | POA: Diagnosis not present

## 2023-01-10 DIAGNOSIS — F419 Anxiety disorder, unspecified: Secondary | ICD-10-CM

## 2023-01-10 DIAGNOSIS — F411 Generalized anxiety disorder: Secondary | ICD-10-CM | POA: Diagnosis not present
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
## 2023-01-24 ENCOUNTER — Ambulatory Visit (HOSPITAL_COMMUNITY): Payer: BC Managed Care – PPO | Admitting: Licensed Clinical Social Worker

## 2023-01-27 ENCOUNTER — Telehealth (HOSPITAL_COMMUNITY): Payer: Self-pay | Admitting: Psychiatry

## 2023-01-27 NOTE — Telephone Encounter (Signed)
No answer and no voicemail when calling to reschedule 02/01/2023 appointment. Sending MyChart message

## 2023-01-31 ENCOUNTER — Ambulatory Visit (INDEPENDENT_AMBULATORY_CARE_PROVIDER_SITE_OTHER): Payer: BC Managed Care – PPO | Admitting: Licensed Clinical Social Worker

## 2023-01-31 DIAGNOSIS — F4310 Post-traumatic stress disorder, unspecified: Secondary | ICD-10-CM

## 2023-01-31 DIAGNOSIS — F331 Major depressive disorder, recurrent, moderate: Secondary | ICD-10-CM | POA: Diagnosis not present

## 2023-01-31 DIAGNOSIS — F32A Depression, unspecified: Secondary | ICD-10-CM

## 2023-01-31 DIAGNOSIS — F411 Generalized anxiety disorder: Secondary | ICD-10-CM | POA: Diagnosis not present

## 2023-01-31 DIAGNOSIS — F419 Anxiety disorder, unspecified: Secondary | ICD-10-CM | POA: Diagnosis not present
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
## 2023-02-01 ENCOUNTER — Ambulatory Visit (HOSPITAL_COMMUNITY): Payer: BC Managed Care – PPO | Admitting: Psychiatry

## 2023-02-14 ENCOUNTER — Ambulatory Visit (INDEPENDENT_AMBULATORY_CARE_PROVIDER_SITE_OTHER): Payer: BC Managed Care – PPO | Admitting: Licensed Clinical Social Worker

## 2023-02-14 DIAGNOSIS — F411 Generalized anxiety disorder: Secondary | ICD-10-CM

## 2023-02-14 DIAGNOSIS — F331 Major depressive disorder, recurrent, moderate: Secondary | ICD-10-CM

## 2023-02-14 DIAGNOSIS — F4310 Post-traumatic stress disorder, unspecified: Secondary | ICD-10-CM

## 2023-02-14 DIAGNOSIS — F32A Depression, unspecified: Secondary | ICD-10-CM

## 2023-02-14 DIAGNOSIS — F419 Anxiety disorder, unspecified: Secondary | ICD-10-CM

## 2023-02-14 NOTE — Progress Notes (Signed)
Patient did not show for appointment.   

## 2023-03-01 ENCOUNTER — Encounter (HOSPITAL_COMMUNITY): Payer: Self-pay | Admitting: Psychiatry

## 2023-03-01 ENCOUNTER — Ambulatory Visit (INDEPENDENT_AMBULATORY_CARE_PROVIDER_SITE_OTHER): Payer: BC Managed Care – PPO | Admitting: Psychiatry

## 2023-03-01 VITALS — BP 126/81 | HR 72 | Ht 69.0 in | Wt 179.8 lb

## 2023-03-01 DIAGNOSIS — F4310 Post-traumatic stress disorder, unspecified: Secondary | ICD-10-CM

## 2023-03-01 DIAGNOSIS — F331 Major depressive disorder, recurrent, moderate: Secondary | ICD-10-CM

## 2023-03-01 DIAGNOSIS — F411 Generalized anxiety disorder: Secondary | ICD-10-CM | POA: Diagnosis not present

## 2023-03-01 MED ORDER — PAROXETINE HCL ER 12.5 MG PO TB24: 12.5000 mg | ORAL_TABLET | Freq: Every day | ORAL | 1 refills | Status: DC
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
## 2023-04-12 ENCOUNTER — Ambulatory Visit (INDEPENDENT_AMBULATORY_CARE_PROVIDER_SITE_OTHER): Payer: BC Managed Care – PPO | Admitting: Licensed Clinical Social Worker

## 2023-04-12 DIAGNOSIS — F411 Generalized anxiety disorder: Secondary | ICD-10-CM | POA: Diagnosis not present

## 2023-04-12 DIAGNOSIS — F4310 Post-traumatic stress disorder, unspecified: Secondary | ICD-10-CM

## 2023-04-12 DIAGNOSIS — F331 Major depressive disorder, recurrent, moderate: Secondary | ICD-10-CM | POA: Diagnosis not present

## 2023-04-12 NOTE — Progress Notes (Signed)
THERAPIST PROGRESS NOTE  Session Time: 1:00 PM to 1:50 PM  Participation Level: Active  Behavioral Response: CasualAlertappropriate  Type of Therapy: Individual Therapy  Treatment Goals addressed:  PTSD, anxiety, depression relationship issues, coping  ProgressTowards Goals: Progressing-reviewed CPT strategies for patient as well as CBT for working on mental health symptoms noting looking for cognitive distortions that we will help with symptoms  Interventions: CBT, Solution Focused, Strength-based, Supportive, and Other: trauma  Summary: Harry Williams is a 42 y.o. male who presents with outside of work life is ok has been busy with family things. Had graduation ceremonies which is a great moment. Wife got a new job. Talked to a lawyer and told her what has been going on since the beginning with work a lot of red flags. Will get back to patient to talk about what finds out what the red flags there are.  Therapist remembered and reviewed some of the negative incidences noting some of the things seem to be a illegal.  Patient added to more things. About three weeks ago patient came in acknowledged 30 minutes behind schedule. Written up since have a 7 minute window. That was fine two guys show up after and the same meetings not going to write them out.  Explored what this was about patient seems singled out patient agrees probably because he speaks out when does not see things that are fair. Taken more drug tests than anyone. Therapist says sees signs of harrassment and patient agrees. Everything goes on patient documents. Work has a log for afternoon meetings there was a note written on side of sheet have to sign. The guy gave meeting said directed mostly to patient. 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 Patient asks how explain that to DOT. Saying anot allowed to take a lunch only two times. How can get away with this. Patient feels wrote this  to manipulate them to do what they why wrote on paper and not typed up seems doing something illegal. Will pull over if tired and needs rest safety issues and if they cause problem with lunch will be a problem. Allowed to have lunch. Feeling like bullied.   Reviewed treatment plan patient identifies working on the same goals feels therapy is helpful and working on those goals.  Reviewed where we were with cognitive processing reviewed the app how to do the app patient for next time will write his impact statement therapist explained we will be looking for stuck points these will be cognitive distortions that are based on what caused the event or how it looks at things now that are roadblocks for trauma.  Will track his symptoms with PHQ-9 and PCL-5 also there are readings for him to do to better understand the process.  Therapist went back to CBT and talked about how thoughts affect how we feel.  Noted the nature of automatic thoughts makes it hard to identify.  Gave patient a couple tools to review the situation until he remembers the thought behind the difficult emotion as well as stretching out the thoughts so to complete thought.  Showed patient way to track and look at the situation strong feelings and rated and what he was thinking better able to catch what is behind painful emotions Therapist provided underlying concepts to help with showing patient what were doing using Buddhism talked about the right view.  According to the teaching Zmuda some of are suffering results  from having the wrong perception of the world when we see things from her own perspective are distortions prevent [name] from seeing things objectively the Buddha reminds [name] that with Brightview or by removing the eye from our perspective we can see reality as it is-the work is prajna-wisdom and say in prescription.  Talked about blocking beliefs form by process of conditioning that people see the world from their own narrow perspective because  of what happened to them in the past someone comes to trauma the self referential beliefs tend to be rigid.  Therapist noted and working on cognitive processing we are going to be looking for these blocking beliefs to help with symptoms of trauma.  Using mindfulness as well not only to look at things objectively but then be able to observe the difference between what happened then and what is happening now by observing the data client slurred not to believe everything they thought was true they will or not to identify with their own blocking blocks.  Reviewed in session as patient has not been here in a couple months reviewing significant events changes in symptoms as well as processing thoughts and symptoms in session  Suicidal/Homicidal: No  Plan: Return again in 3 weeks.2.  Patient work on index trauma and less than 1 therapist continue with thoughts and feelings workbook as well as looking at mindfulness from EMDR book  Diagnosis:  Major depressive disorder, recurrent, moderate, generalized anxiety disorder, PTSD depression and anxiety   Collaboration of Care: Medication Management AEB review of Dr. Gilmore Laroche last note  Patient/Guardian was advised Release of Information must be obtained prior to any record release in order to collaborate their care with an outside provider. Patient/Guardian was advised if they have not already done so to contact the registration department to sign all necessary forms in order for [name] to release information regarding their care.   Consent: Patient/Guardian gives verbal consent for treatment and assignment of benefits for services provided during this visit. Patient/Guardian expressed understanding and agreed to proceed.   Coolidge Breeze, LCSW 04/12/2023

## 2023-05-02 ENCOUNTER — Ambulatory Visit (INDEPENDENT_AMBULATORY_CARE_PROVIDER_SITE_OTHER): Payer: BC Managed Care – PPO | Admitting: Licensed Clinical Social Worker

## 2023-05-02 DIAGNOSIS — F411 Generalized anxiety disorder: Secondary | ICD-10-CM

## 2023-05-02 DIAGNOSIS — F331 Major depressive disorder, recurrent, moderate: Secondary | ICD-10-CM

## 2023-05-02 DIAGNOSIS — F4310 Post-traumatic stress disorder, unspecified: Secondary | ICD-10-CM

## 2023-05-02 NOTE — Progress Notes (Signed)
THERAPIST PROGRESS NOTE  Session Time: 1:00 PM to 1:45 PM  Participation Level: Active  Behavioral Response: CasualAlertEuthymic  Type of Therapy: Individual Therapy  Treatment Goals addressed: PTSD, anxiety, depression relationship issues, coping  ProgressTowards Goals: Progressing-worked on social anxiety also using cognitive processing therapy to explore stuck points that can help with trauma symptoms  Interventions: CBT, Solution Focused, Strength-based, Supportive, and Other: Trauma  Summary: Harry Williams is a 42 y.o. male who presents with daughter graduated from college.[name]. She is getting in nursing program wants to go as far as Charity fundraiser.  Talked about work patient is at point where everytime had a breakdown with vehicle called them to address the situation. They give numbers back to track to let them know took care of the situation. Informed that once tracking number tracked everything handled and nothing worry on his end. The big thing behind the number is that is how supposed to be paid. Looked at the tracking number and not getting paid for any of them.  Addressed with terminal manger told him every date went through something with company patient will go through text and find tracking numbers and dates and time of incident. They have paper reports they fill out and he will send it. Go back as far as phone has information fill out forms and see if they reimburse him.  Working on social anxiety patient was anxious but did go to [employer]. Anxious that would show up late. He did show late because of traffic. Did up show late but it was ok.   Processed this with patient about his anxiety patient says when it comes to people looking tries to turn off those thoughts which is what therapist is recommending also other self-talk see below.  Looked at CPT manual to work on cognitive processing for trauma therapist providing education that identifying exaggerated negative beliefs  can help with trauma symptoms, helping him to change distorted perspectives that help him not relive perspectives from from trauma perspective. .   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  Therapist provided treatment interventions by sharing insights people are not that focused on this, the moment is not that important do not pay attention to the negative voice all this to remind himself as patient himself said to set out internal voice that is giving him this message.  Therapist noting were not as important to other people as we think, people are not as focused on on his them we want to get to the place of who cares we do not want to put our value in to strangers hands.  Patient pointed out what is helpful for him the continuing relationship therapist noted that is very insightful the relationship is one of the most important parts of progress.  Looked at patient's index trauma he utilized expecting vehicle for safety turned out unsafe.  Looked at this is an appropriate traumatic incident as this is why he came to therapy.  Therapist read to him from CPT manual about the cognitive model.  How the just world therapy can be a stuck point as we are talk good things happen to good people bad things happen and bad people but that is not always the case, we may give her self more blame just to have more control of the situation.  In patient's case there are no distortions in why he thinks the incident happened.  Therapist  asked him to also write how it affects his thinking about himself in the world in different areas such as trust esteem intimacy that there might be some stuck points there.  The reading help patient to understand stuck points or unbalanced exaggerating negative extreme in the point of this therapy is to noticed this as it will help change perspective help him have a helpful perspective of present not based on  the past.  Noted is well patient using deep breathing. Suicidal/Homicidal: No  Plan: Return again in 2 weeks.2.  Look at other sections of CPT book including describing stuck points, can look at mindfulness from EMDR book can look at thoughts and feelings workbook particularly first chapter  Diagnosis:  Major depressive disorder, recurrent, moderate, generalized anxiety disorder, PTSD depression and anxiety  Collaboration of Care: Other none needed  Patient/Guardian was advised Release of Information must be obtained prior to any record release in order to collaborate their care with an outside provider. Patient/Guardian was advised if they have not already done so to contact the registration department to sign all necessary forms in order for [name] to release information regarding their care.   Consent: Patient/Guardian gives verbal consent for treatment and assignment of benefits for services provided during this visit. Patient/Guardian expressed understanding and agreed to proceed.   Coolidge Breeze, LCSW 05/02/2023

## 2023-05-16 ENCOUNTER — Ambulatory Visit (INDEPENDENT_AMBULATORY_CARE_PROVIDER_SITE_OTHER): Payer: BC Managed Care – PPO | Admitting: Licensed Clinical Social Worker

## 2023-05-16 DIAGNOSIS — F4310 Post-traumatic stress disorder, unspecified: Secondary | ICD-10-CM

## 2023-05-16 DIAGNOSIS — F411 Generalized anxiety disorder: Secondary | ICD-10-CM | POA: Diagnosis not present

## 2023-05-16 DIAGNOSIS — F331 Major depressive disorder, recurrent, moderate: Secondary | ICD-10-CM

## 2023-05-16 NOTE — Progress Notes (Signed)
THERAPIST PROGRESS NOTE  Session Time: 1:00 PM to 1:45 PM  Participation Level: Active  Behavioral Response: CasualAlertEuthymic  Type of Therapy: Individual Therapy  Treatment Goals addressed: PTSD, anxiety, depression relationship issues, coping  ProgressTowards Goals: Progressing-using CPT in session challenging stuck points and changing with something more accurate and helpful  Interventions: Solution Focused, Strength-based, Supportive, and Other: trauma, copiong  Summary: Harry Williams is a 42 y.o. male who presents with more issues with company not compensating him for going further to [place]. Also supposed to get lay over pay for being shut down in [place]. Only got money for a meal. When saw paycheck was angry. Not angry anymore feels covered himself helping to talk to therapist and keep track by receiving confirmation and delay numbers. With these numbers once confirm number you should be paid. Should already have been paid. Waiting for lawyer through wife's benefits. Feels with these numbers that they confirmed that it is a paper trail. Only thing can do is do job and take information. Therapist said good coping all can do. Not going to fly off the handle. If angry then angry inside. Knows what is owed by the fact that have the times broken down times and dates. Staying quiet. Feels like staying quiet because when address issues, sees goes nowhere with this with the company. Stay quiet and talk to professional about this Psychiatric nurse). Thinks coming in to therapy and applying it have been helping. Coach himself to stay calm lane. Therapist noted a calmness about him. Reading about trauma from the app and can see the impact that goes into flight and see how relates to the app itself.    Any event triggers the thought I was a bad kid and deserve this to happen. Feels ok with it bound to happen because of what did in the past the past catching up with him. Therapist began to challenge bad things  we did does that make [name] the cause of bad things happen. Things happen that are random and that we don't have that kind of power to affect events like that. Not perfect fallible most do is learn from where fell short. Patient said what could say to himself in the future Point where started do better in life stay on that road stay positive through everything even if negative try to find the positive and keep moving. Got in a lot of fights taught to grow up fighting. Therapist pointed out that was what he learned as a kid. Didn't feel harm usually won. Thought it was a controlled environment adults monitoring when fought but what took patient further when grandfather passed the same person inside himself still there. Doesn't see grandfather tying to do anything wrong but can as got older not a good way to be with his kids. Leads to things not good such as anger and bullying. Change came when moved to [place] at 12. Didn't want to go fighting people but was in him. Not looking for fights still getting into them.What say himself in future that he has self-coached himself you don't engage in everything that comes his way. Not coward if don't engage. Therapist also challenged patient not a bad kid how responsibility is he from what he learned adults  taught him what knew still young lacks capacities he has an adult to rationally understand things.   Reviewed stressors at work continued stressors and therapist impressed by how patient is coping he is coached himself into a calm lane realizing speaking  up getting frustrated does not do anything works against him.  His focus has been on documenting everything that we will give to a lawyer as well as realizing only can do what he can do does not have control over some things and what he has control over is doing his job.  Therapist continues to provide strength-based and supportive interventions as patient processes his feelings his frustrations as well as his good coping  strategies.  Looked at CPT app patient shared what he is getting he can see flight mode and staying away from the exercises but also doing the readings helping him better understand more about his trauma symptoms.  Decided to continue as the app says if he sticks with that will get better we looked at 1 stuck point that he has bad kid and deserves for bad things to happen.  We looked at sheet to challenge these thoughts.  Therapist noted he was a kid and he learned from the adults how to be a fighter also realize kids do not have the cognitive capacity to reason things out there more like taking observing what they learn so having better recognition the cause was not so much bad kid but what he learned.  As patient himself said self coached to a better lay therapist pointed out to deserves credit for changing up what he learned.  Therapist helps explain concepts to apply to his kids to see how much he would give responsibility to his kids able to see he would have them fight or put themselves in those situation.  Therapist added other refutes we all do bad things does not mean we deserve bad things to happen that we do not have that kind of power in the universe to cause things to happen life can be random good things can happen a bad people and bad could happen to good people.    .Suicidal/Homicidal: No  Plan: Return again in 2 weeks.2.Continue app with stuck points. Look at CPT book, thoughts and feelings chapter on anxiety, traditional trauma work  Diagnosis: Major depressive disorder, recurrent, moderate, generalized anxiety disorder, PTSD depression and anxiety  Collaboration of Care: Other none needed  Patient/Guardian was advised Release of Information must be obtained prior to any record release in order to collaborate their care with an outside provider. Patient/Guardian was advised if they have not already done so to contact the registration department to sign all necessary forms in order for [name]  to release information regarding their care.   Consent: Patient/Guardian gives verbal consent for treatment and assignment of benefits for services provided during this visit. Patient/Guardian expressed understanding and agreed to proceed.   Coolidge Breeze, LCSW 05/16/2023

## 2023-05-30 ENCOUNTER — Ambulatory Visit (INDEPENDENT_AMBULATORY_CARE_PROVIDER_SITE_OTHER): Payer: BC Managed Care – PPO | Admitting: Licensed Clinical Social Worker

## 2023-05-30 DIAGNOSIS — F4310 Post-traumatic stress disorder, unspecified: Secondary | ICD-10-CM

## 2023-05-30 DIAGNOSIS — F419 Anxiety disorder, unspecified: Secondary | ICD-10-CM

## 2023-05-30 DIAGNOSIS — F411 Generalized anxiety disorder: Secondary | ICD-10-CM

## 2023-05-30 DIAGNOSIS — F331 Major depressive disorder, recurrent, moderate: Secondary | ICD-10-CM | POA: Diagnosis not present

## 2023-05-30 NOTE — Progress Notes (Signed)
   THERAPIST PROGRESS NOTE  Session Time: 1:00 PM to 1:47 PM  Participation Level: Active  Behavioral Response: CasualAlertEuthymic  Type of Therapy: Individual Therapy  Treatment Goals addressed:  PTSD, anxiety, depression relationship issues, coping  ProgressTowards Goals: Progressing-talked about cognitive therapy tied it into trauma based therapy as well as strategies for mental health symptoms  Interventions: CBT, Solution Focused, Strength-based, Supportive, and Other: coping  Summary: Harry Williams is a 42 y.o. male who presents with extremely busy over the past two weeks. Recently moved aunt who lives in [place]. In [place] high school reunion Helping father move from [place] to [place].  Reviewed narrative trauma therapy related its connection with cognitive processing therapy patient can understand that more exposures can help him to open up and symptoms will improve.  Therapist noted is well challenging of distortions and interpretation is involved as well.  Tied this to her next activity with the "Thoughts and feelings workbook" connecting this to cognitive work in general in therapy we often are looking at way the patient's interpret things and helping them see distortions.  Therapist introduced the model that thoughts cause feelings in other words events by themselves have no emotional content it is your interpretation of an event that causes your emotions.  This is often represented is the ABC model of emotions where a stance for activating event be stands for believe for thought and see stands for consequence or feeling.  That if you change her thought you change your feeling.  Therapist noted tuning into your automatic thoughts is like a skill so needs practice.  Discussed feedback loops for feelings and body sensations can be the activating event which escalates symptoms.  Noted ongoing internal monologue with our thoughts why they are called automatic thoughts  they seem to happen without any prior reflection describe the nature of automatic thoughts they often appear in short hand they are almost always believed they are experienced a spontaneous they are often couch in terms of should ought or must they tend to awfulize.  Therapist encouraged patient to do noticed the thought which will take time and practice when he has a strong emotion as this probably is impacting his emotion.  Encouraged him to practice to start to notice his patterns.  Therapist provided space and support for patient to talk about thoughts and feelings in session. Suicidal/Homicidal: No  Plan: Return again in 4 weeks.2.  Continue to work with patient on thoughts and feelings workbook and CPT stuck points  Diagnosis: Major depressive disorder, recurrent, moderate, generalized anxiety disorder, PTSD depression and anxiety  Collaboration of Care: Other none needed  Patient/Guardian was advised Release of Information must be obtained prior to any record release in order to collaborate their care with an outside provider. Patient/Guardian was advised if they have not already done so to contact the registration department to sign all necessary forms in order for [name] to release information regarding their care.   Consent: Patient/Guardian gives verbal consent for treatment and assignment of benefits for services provided during this visit. Patient/Guardian expressed understanding and agreed to proceed.   Coolidge Breeze, LCSW 05/30/2023

## 2023-06-13 ENCOUNTER — Ambulatory Visit (HOSPITAL_COMMUNITY): Payer: BC Managed Care – PPO | Admitting: Licensed Clinical Social Worker

## 2023-06-16 ENCOUNTER — Encounter: Payer: Self-pay | Admitting: Family Medicine

## 2023-06-16 ENCOUNTER — Ambulatory Visit (INDEPENDENT_AMBULATORY_CARE_PROVIDER_SITE_OTHER): Payer: BC Managed Care – PPO | Admitting: Family Medicine

## 2023-06-16 VITALS — BP 128/78 | HR 70 | Ht 68.0 in | Wt 176.0 lb

## 2023-06-16 DIAGNOSIS — E559 Vitamin D deficiency, unspecified: Secondary | ICD-10-CM

## 2023-06-16 DIAGNOSIS — Z1159 Encounter for screening for other viral diseases: Secondary | ICD-10-CM

## 2023-06-16 DIAGNOSIS — Z114 Encounter for screening for human immunodeficiency virus [HIV]: Secondary | ICD-10-CM

## 2023-06-16 DIAGNOSIS — F419 Anxiety disorder, unspecified: Secondary | ICD-10-CM

## 2023-06-16 DIAGNOSIS — Z0000 Encounter for general adult medical examination without abnormal findings: Secondary | ICD-10-CM

## 2023-06-16 DIAGNOSIS — F32A Depression, unspecified: Secondary | ICD-10-CM

## 2023-06-16 DIAGNOSIS — R221 Localized swelling, mass and lump, neck: Secondary | ICD-10-CM

## 2023-06-16 DIAGNOSIS — M4726 Other spondylosis with radiculopathy, lumbar region: Secondary | ICD-10-CM

## 2023-06-16 DIAGNOSIS — R220 Localized swelling, mass and lump, head: Secondary | ICD-10-CM | POA: Insufficient documentation

## 2023-06-16 DIAGNOSIS — Z13220 Encounter for screening for lipoid disorders: Secondary | ICD-10-CM

## 2023-06-16 DIAGNOSIS — Z125 Encounter for screening for malignant neoplasm of prostate: Secondary | ICD-10-CM

## 2023-06-16 NOTE — Assessment & Plan Note (Signed)
Incidentally noted on exam, patient gives history of chronicity, minimally symptomatic, uncertain about size change.  Most likely secondary LAD to ongoing oropharyngeal symptoms, however will evaluate this further with U/S

## 2023-06-16 NOTE — Assessment & Plan Note (Signed)
Follows with behavioral therapy, noting benefit. We reviewed scores and adjunct pharmacotherapy.  - Patient to touch base with behavioral therapist about utility of medication as adjunct - If beneficial, start Auvelity daily with follow-up for titration, etc (Rx sample provided)

## 2023-06-16 NOTE — Progress Notes (Signed)
Annual Physical Exam Visit  Patient Information:  Patient ID: Harry Williams, male DOB: 05/21/1981 Age: 42 y.o. MRN: [id number]   Subjective:   CC: Annual Physical Exam  HPI:  Harry Williams is here for their annual physical.  I reviewed the past medical history, family history, social history, surgical history, and allergies today and changes were made as necessary.  Please see the problem list section below for additional details.  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/24/2021   Bright red blood per rectum 12/16/2022   Closed fracture of nasal bone with routine healing 12/24/2016   Depression    PTSD (post-traumatic stress disorder)    Past Surgical History: Past Surgical History:  Procedure Laterality Date   APPENDECTOMY     COLON SURGERY  03/30/2015   JOINT REPLACEMENT  2008   partial right elbow; right femoral graft   Total abdominal proctocolectomy, J-pouch, ileal pouch anal anastomosis  2016   Family History: Family History  Problem Relation Age of Onset   Arthritis Mother    Anemia Mother    Diabetes Father    Hypertension Brother    Asthma Daughter    Heart disease Paternal Grandmother    Cancer Paternal Grandfather    Allergies: No Known Allergies Health Maintenance: Health Maintenance  Topic Date Due   HIV Screening  Never done   Hepatitis C Screening  Never done   INFLUENZA VACCINE  06/30/2023   DTaP/Tdap/Td (3 - Td or Tdap) 10/10/2032   HPV VACCINES  Aged Out   COVID-19 Vaccine  Discontinued    HM Colonoscopy     This patient has no relevant Health Maintenance data.      Medications: No current outpatient medications on file prior to visit.   No current facility-administered medications on file prior to visit.    Review of Systems: No headache, visual changes, nausea, vomiting, diarrhea, constipation, dizziness, abdominal pain, skin rash, fevers,  chills, night sweats, swollen lymph nodes, weight loss, chest pain, body aches, joint swelling, muscle aches, shortness of breath, mood changes, visual or auditory hallucinations reported.  Objective:   Vitals:   06/16/23 0914  BP: 128/78  Pulse: 70  SpO2: 99%   Vitals:   06/16/23 0914  Weight: 176 lb (79.8 kg)  Height: 5\' 8"  (1.727 m)   Body mass index is 26.76 kg/m.  General: Well Developed, well nourished, and in no acute distress.  Neuro: Alert and oriented x3, extra-ocular muscles intact, sensation grossly intact. Cranial nerves II through XII are grossly intact, motor, sensory, and coordinative functions are intact. HEENT: Normocephalic, atraumatic, pupils equal round reactive to light, neck supple, no masses, no lymphadenopathy, thyroid nonpalpable. Oropharynx, nasopharynx, external ear canals are unremarkable. Skin: Warm and dry, no rashes noted.  Cardiac: Regular rate and rhythm, no murmurs rubs or gallops. No peripheral edema. Pulses symmetric. Respiratory: Clear to auscultation bilaterally. Not using accessory muscles, speaking in full sentences.  Abdominal: Soft, nontender, nondistended, positive bowel sounds, no masses, no organomegaly. Musculoskeletal: Shoulder, elbow, wrist, hip, knee, ankle stable, and with full range of motion.   Impression and Recommendations:   The patient was counselled, risk factors were discussed, and anticipatory guidance given.  Problem List Items Addressed This Visit       Nervous and Auditory   Other spondylosis with radiculopathy, lumbar region     Other   Submandibular swelling  Incidentally noted on exam, patient gives history of chronicity, minimally symptomatic, uncertain about size change.  Most likely secondary LAD to ongoing oropharyngeal symptoms, however will evaluate this further with U/S      Relevant Orders   US Soft Tissue Head/Neck (NON-THYROID)   Healthcare maintenance - Primary    Annual examination  completed, risk stratification labs ordered, anticipatory guidance provided.  We will follow labs once resulted.      Relevant Orders   CBC   Comprehensive metabolic panel   Hepatitis C antibody   HIV Antibody (routine testing w rflx)   Lipid panel   PSA Total (Reflex To Free)   TSH   VITAMIN D 25 Hydroxy (Vit-D Deficiency, Fractures)   Anxiety and depression    Follows with behavioral therapy, noting benefit. We reviewed scores and adjunct pharmacotherapy.  - Patient to touch base with behavioral therapist about utility of medication as adjunct - If beneficial, start Auvelity daily with follow-up for titration, etc (Rx sample provided)      Other Visit Diagnoses     Annual physical exam       Relevant Orders   CBC   Comprehensive metabolic panel   Hepatitis C antibody   HIV Antibody (routine testing w rflx)   Lipid panel   PSA Total (Reflex To Free)   TSH   VITAMIN D 25 Hydroxy (Vit-D Deficiency, Fractures)   Vitamin D deficiency       Relevant Orders   VITAMIN D 25 Hydroxy (Vit-D Deficiency, Fractures)   Need for hepatitis C screening test       Relevant Orders   Hepatitis C antibody   Screening for HIV (human immunodeficiency virus)       Relevant Orders   HIV Antibody (routine testing w rflx)   Screening for prostate cancer       Relevant Orders   PSA Total (Reflex To Free)   Screening for lipoid disorders       Relevant Orders   Comprehensive metabolic panel   Lipid panel        Orders & Medications Medications: No orders of the defined types were placed in this encounter.  Orders Placed This Encounter  Procedures   US Soft Tissue Head/Neck (NON-THYROID)   CBC   Comprehensive metabolic panel   Hepatitis C antibody   HIV Antibody (routine testing w rflx)   Lipid panel   PSA Total (Reflex To Free)   TSH   VITAMIN D 25 Hydroxy (Vit-D Deficiency, Fractures)     No follow-ups on file.    [name], MD, [facility]   Primary Care Sports  Medicine Primary Care and Sports Medicine at [facility]

## 2023-06-16 NOTE — Assessment & Plan Note (Signed)
>>  ASSESSMENT AND PLAN FOR ANXIETY AND DEPRESSION WRITTEN ON 06/16/2023  9:45 AM BY Dannette Kinkaid J, MD  Follows with behavioral therapy, noting benefit. We reviewed scores and adjunct pharmacotherapy.  - Patient to touch base with behavioral therapist about utility of medication as adjunct - If beneficial, start Auvelity  daily with follow-up for titration, etc (Rx sample provided)

## 2023-06-16 NOTE — Assessment & Plan Note (Signed)
Annual examination completed, risk stratification labs ordered, anticipatory guidance provided.  We will follow labs once resulted. 

## 2023-06-16 NOTE — Patient Instructions (Signed)
-   Obtain fasting labs with orders provided (can have water or black coffee but otherwise no food or drink x 8 hours before labs) - Review information provided - Attend eye doctor annually, dentist every 6 months, work towards or maintain 30 minutes of moderate intensity physical activity at least 5 days per week, and consume a balanced diet - Return in 1 year for physical - Contact us for any questions between now and then  Additionally: - Keep follow-up with behavioral therapy, can discuss the role of medications - If wanting to proceed with medications, start Auvelity daily and then contact us

## 2023-06-18 LAB — CBC
Hematocrit: 45.2 % (ref 37.5–51.0)
Hemoglobin: 14.9 g/dL (ref 13.0–17.7)
MCH: 30.5 pg (ref 26.6–33.0)
MCHC: 33 g/dL (ref 31.5–35.7)
MCV: 93 fL (ref 79–97)
Platelets: 220 10*3/uL (ref 150–450)
RBC: 4.88 x10E6/uL (ref 4.14–5.80)
RDW: 13.9 % (ref 11.6–15.4)
WBC: 4.6 10*3/uL (ref 3.4–10.8)

## 2023-06-18 LAB — COMPREHENSIVE METABOLIC PANEL
ALT: 18 IU/L (ref 0–44)
AST: 22 IU/L (ref 0–40)
Albumin: 4.4 g/dL (ref 4.1–5.1)
Alkaline Phosphatase: 72 IU/L (ref 44–121)
BUN/Creatinine Ratio: 8 — ABNORMAL LOW (ref 9–20)
BUN: 9 mg/dL (ref 6–24)
Bilirubin Total: 1 mg/dL (ref 0.0–1.2)
CO2: 27 mmol/L (ref 20–29)
Calcium: 9.9 mg/dL (ref 8.7–10.2)
Chloride: 101 mmol/L (ref 96–106)
Creatinine, Ser: 1.13 mg/dL (ref 0.76–1.27)
Globulin, Total: 2.6 g/dL (ref 1.5–4.5)
Glucose: 87 mg/dL (ref 70–99)
Potassium: 4.8 mmol/L (ref 3.5–5.2)
Sodium: 139 mmol/L (ref 134–144)
Total Protein: 7 g/dL (ref 6.0–8.5)
eGFR: 84 mL/min/{1.73_m2} (ref >59)

## 2023-06-18 LAB — PSA TOTAL (REFLEX TO FREE): Prostate Specific Ag, Serum: 0.7 ng/mL (ref 0.0–4.0)

## 2023-06-18 LAB — VITAMIN D 25 HYDROXY (VIT D DEFICIENCY, FRACTURES): Vit D, 25-Hydroxy: 14 ng/mL — ABNORMAL LOW (ref 30.0–100.0)

## 2023-06-18 LAB — LIPID PANEL
Chol/HDL Ratio: 4.5 ratio (ref 0.0–5.0)
Cholesterol, Total: 194 mg/dL (ref 100–199)
HDL: 43 mg/dL (ref >39)
LDL Chol Calc (NIH): 121 mg/dL — ABNORMAL HIGH (ref 0–99)
Triglycerides: 169 mg/dL — ABNORMAL HIGH (ref 0–149)
VLDL Cholesterol Cal: 30 mg/dL (ref 5–40)

## 2023-06-18 LAB — HEPATITIS C ANTIBODY: Hep C Virus Ab: NONREACTIVE

## 2023-06-18 LAB — HIV ANTIBODY (ROUTINE TESTING W REFLEX): HIV Screen 4th Generation wRfx: NONREACTIVE

## 2023-06-18 LAB — TSH: TSH: 2.47 u[IU]/mL (ref 0.450–4.500)

## 2023-06-19 ENCOUNTER — Other Ambulatory Visit: Payer: Self-pay | Admitting: Family Medicine

## 2023-06-19 DIAGNOSIS — E559 Vitamin D deficiency, unspecified: Secondary | ICD-10-CM

## 2023-06-19 MED ORDER — VITAMIN D (ERGOCALCIFEROL) 1.25 MG (50000 UNIT) PO CAPS: 50000.0000 [IU] | ORAL_CAPSULE | ORAL | 0 refills | Status: DC

## 2023-06-20 ENCOUNTER — Ambulatory Visit (HOSPITAL_COMMUNITY): Payer: BC Managed Care – PPO

## 2023-06-27 ENCOUNTER — Ambulatory Visit (INDEPENDENT_AMBULATORY_CARE_PROVIDER_SITE_OTHER): Payer: Self-pay | Admitting: Licensed Clinical Social Worker

## 2023-06-27 DIAGNOSIS — F4310 Post-traumatic stress disorder, unspecified: Secondary | ICD-10-CM

## 2023-06-27 DIAGNOSIS — F331 Major depressive disorder, recurrent, moderate: Secondary | ICD-10-CM

## 2023-06-27 DIAGNOSIS — F419 Anxiety disorder, unspecified: Secondary | ICD-10-CM

## 2023-06-27 DIAGNOSIS — F411 Generalized anxiety disorder: Secondary | ICD-10-CM

## 2023-06-27 NOTE — Progress Notes (Signed)
Patient is a late cancel appointment

## 2023-07-04 ENCOUNTER — Ambulatory Visit (INDEPENDENT_AMBULATORY_CARE_PROVIDER_SITE_OTHER): Payer: BC Managed Care – PPO | Admitting: Licensed Clinical Social Worker

## 2023-07-04 DIAGNOSIS — F4310 Post-traumatic stress disorder, unspecified: Secondary | ICD-10-CM

## 2023-07-04 DIAGNOSIS — F331 Major depressive disorder, recurrent, moderate: Secondary | ICD-10-CM

## 2023-07-04 DIAGNOSIS — F419 Anxiety disorder, unspecified: Secondary | ICD-10-CM | POA: Diagnosis not present

## 2023-07-04 DIAGNOSIS — F411 Generalized anxiety disorder: Secondary | ICD-10-CM
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
## 2023-07-07 ENCOUNTER — Ambulatory Visit (HOSPITAL_COMMUNITY)
Admission: RE | Admit: 2023-07-07 | Discharge: 2023-07-07 | Disposition: A | Discharge status: Home / Self Care | Payer: BC Managed Care – PPO | Source: Ambulatory Visit | Attending: Family Medicine | Admitting: Family Medicine

## 2023-07-07 DIAGNOSIS — R221 Localized swelling, mass and lump, neck: Secondary | ICD-10-CM | POA: Insufficient documentation

## 2023-07-07 DIAGNOSIS — R220 Localized swelling, mass and lump, head: Secondary | ICD-10-CM | POA: Insufficient documentation

## 2023-07-13 ENCOUNTER — Other Ambulatory Visit: Payer: Self-pay | Admitting: Family Medicine

## 2023-07-13 DIAGNOSIS — R220 Localized swelling, mass and lump, head: Secondary | ICD-10-CM

## 2023-07-13 DIAGNOSIS — R590 Localized enlarged lymph nodes: Secondary | ICD-10-CM

## 2023-07-20 ENCOUNTER — Ambulatory Visit (HOSPITAL_COMMUNITY)
Admission: RE | Admit: 2023-07-20 | Discharge: 2023-07-20 | Disposition: A | Discharge status: Home / Self Care | Payer: BC Managed Care – PPO | Source: Ambulatory Visit | Attending: Family Medicine | Admitting: Family Medicine

## 2023-07-20 DIAGNOSIS — R220 Localized swelling, mass and lump, head: Secondary | ICD-10-CM | POA: Insufficient documentation

## 2023-07-20 DIAGNOSIS — R590 Localized enlarged lymph nodes: Secondary | ICD-10-CM | POA: Insufficient documentation

## 2023-07-20 MED ORDER — IOHEXOL 350 MG/ML SOLN
75.0000 mL | Freq: Once | INTRAVENOUS | Status: AC | PRN
  Administered 2023-07-20: 75 mL via INTRAVENOUS

## 2023-08-08 ENCOUNTER — Other Ambulatory Visit: Payer: Self-pay | Admitting: Family Medicine

## 2023-08-08 ENCOUNTER — Telehealth: Payer: Self-pay | Admitting: Family Medicine

## 2023-08-08 DIAGNOSIS — R590 Localized enlarged lymph nodes: Secondary | ICD-10-CM

## 2023-08-08 DIAGNOSIS — R220 Localized swelling, mass and lump, head: Secondary | ICD-10-CM

## 2023-08-08 NOTE — Telephone Encounter (Signed)
Please advise 

## 2023-08-08 NOTE — Progress Notes (Signed)
Spoke to ENT Dr.s Elenore Rota and Okey Dupre regarding this patient's scans. Shared plan for referral to Head & Neck Oncology team, opted for Dr. Roma Schanz at their advisement.

## 2023-08-08 NOTE — Telephone Encounter (Signed)
Copied from CRM [id number]. Topic: General - Inquiry >> Aug 08, 2023 12:57 PM Marlow Baars wrote: Reason for CRM: The spouse of the patient called in stating they received results of the patients CT scan of his neck and would like a call from his provider to talk about it as soon as possible. Please assist patient further

## 2023-08-15 ENCOUNTER — Ambulatory Visit (INDEPENDENT_AMBULATORY_CARE_PROVIDER_SITE_OTHER): Payer: BC Managed Care – PPO | Admitting: Licensed Clinical Social Worker

## 2023-08-15 DIAGNOSIS — F4310 Post-traumatic stress disorder, unspecified: Secondary | ICD-10-CM | POA: Diagnosis not present

## 2023-08-15 DIAGNOSIS — F419 Anxiety disorder, unspecified: Secondary | ICD-10-CM | POA: Diagnosis not present

## 2023-08-15 DIAGNOSIS — F411 Generalized anxiety disorder: Secondary | ICD-10-CM | POA: Diagnosis not present

## 2023-08-15 DIAGNOSIS — F331 Major depressive disorder, recurrent, moderate: Secondary | ICD-10-CM | POA: Diagnosis not present

## 2023-08-15 NOTE — Progress Notes (Signed)
THERAPIST PROGRESS NOTE  Session Time: 1:00 PM to 1:45 PM  Participation Level: Active  Behavioral Response: CasualAlertappropriate  Type of Therapy: Individual Therapy  Treatment Goals addressed:  PTSD, anxiety, depression relationship issues, coping  ProgressTowards Goals: Progressing-patient had health news and work stress so processed thoughts and feelings to help with coping also looked at CBT for anxiety to help patient learn more skills to manage anxiety  Interventions: CBT, Solution Focused, Strength-based, Supportive, and Other: coping  Summary: Harry Williams is a 42 y.o. 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  The way he copes is to keep his own personal notes. Talked to a friend who is an attorney who says a lot of red flags. Asks to see an Psychologist, prison and probation services and the policy before following an unfair directives.  Therapist noted this was a good coping strategy also he needs another job based on conditions patient says will keep this job because until there are remedies for what has happened. Patient is good at self-management knows doesn't have to bring home. Patient shared has scratchy throat had a CT and x-rays may have throat cancer. Next step is to go to [facility] have one of the [facility] [place] to get biopsy. When doctor say something felt heavy. Stay strong and don't dwell because don't know. Schedule on 27th. Patient says one day at a time.  Worked on coping for anxiety patient relates symptoms of anxiety including overestimating something bad happening as well as excessive worry over the same things patient says exterior you may not see tries to keep it tucked in.  Describes his anxiety is like a hamster going around the wheel with that.  Looked at beliefs around worry therapist shared she can do extra work on the weekend thinks that helps her be better prepared and better outcome patient also says he will over  prepare for example do a preapplication and then an application thinks what he needs to do for a better outcome  Processed through what is currently going on with patient work remains stressful coping the best he can by keeping notes of the things they are doing eventually being able to bring a complaint legally.  Patient also has had health news needs further testing again patient coping the best that he can which means not overestimating something negative happening waiting till he finds out test results taking things day by day.  Looked at chapter on anxiety to teach patient more skills again emphasizing the importance of relaxation explored how patient able to do this turns out he has a very work heavy schedule can work 14 to 16 hours a day comes home in the morning does take a shower.  Looked at a strategy of giving an accurate assessment of risk not overestimating something negative happening which can happen when nothing bad is happening you are on alert or something bad's happen and you think it is going to happen again.  Noted with patient using skills in his situation to not overestimate negative when there is no evidence for that.  Looked at catastrophizing as an outlook leading to anxiety people not recognizing when bad happens they have ability to cope turns out not to be as bad as they thought.  Noted patient has over preparing this part of his anxiety strategy helps to feel he is more prepared even though he might not need all this extra preparation.  Therapist provided support and  space for patient to talk about thoughts and feelings in session. Suicidal/Homicidal: No  Plan: Return again in 2 weeks.2.  Continue to look at chapter on anxiety can look at mindfulness chapter from EMDR book, can look at CPT chapters for different topics of thought distortions  Diagnosis:  Major depressive disorder, recurrent, moderate, generalized anxiety disorder, PTSD depression and anxiety  Collaboration of  Care: Other none needed  Patient/Guardian was advised Release of Information must be obtained prior to any record release in order to collaborate their care with an outside provider. Patient/Guardian was advised if they have not already done so to contact the registration department to sign all necessary forms in order for [name] to release information regarding their care.   Consent: Patient/Guardian gives verbal consent for treatment and assignment of benefits for services provided during this visit. Patient/Guardian expressed understanding and agreed to proceed.   Coolidge Breeze, LCSW 08/15/2023

## 2023-08-29 ENCOUNTER — Ambulatory Visit (INDEPENDENT_AMBULATORY_CARE_PROVIDER_SITE_OTHER): Payer: BC Managed Care – PPO | Admitting: Licensed Clinical Social Worker

## 2023-08-29 DIAGNOSIS — F419 Anxiety disorder, unspecified: Secondary | ICD-10-CM | POA: Diagnosis not present

## 2023-08-29 DIAGNOSIS — F411 Generalized anxiety disorder: Secondary | ICD-10-CM | POA: Diagnosis not present

## 2023-08-29 DIAGNOSIS — F331 Major depressive disorder, recurrent, moderate: Secondary | ICD-10-CM

## 2023-08-29 DIAGNOSIS — F4310 Post-traumatic stress disorder, unspecified: Secondary | ICD-10-CM

## 2023-08-29 NOTE — Progress Notes (Signed)
THERAPIST PROGRESS NOTE  Session Time: 1:00 PM to 1:45 PM  Participation Level: Active  Behavioral Response: CasualAlertsubdued  Type of Therapy: Individual Therapy  Treatment Goals addressed:  PTSD, anxiety, depression relationship issues, coping  ProgressTowards Goals: Progressing-patient has significant stressor diagnosed with lung cancer utilizing therapy to process thoughts and feelings help with coping  Interventions: Solution Focused, Strength-based, Supportive, and Other: coping  Summary: Lenzy Kerschner is a 42 y.o. male who presents with diagnosed with throat cancer says doing ok. In the next two weeks scheduled into [facility] treatment radiation and chemo for 4-6 week process 5 days a week. 80-90 % positive outcome. Will be out of work. They said could have symptoms like weight loss, nausea, weak. Didn't say anything about mental health but will affect hearing. Wife has it to where she thinks she can fill in the time where he needs transportation. This is all new wife still figuring it out find out on birthday. Didn't let it consume him out of the hospital took a trip to [place] had a surprise party. Great turnout the most that come to his party almost 60 people.  Therapist shared glad that he got to experience that after getting the news patient said it was off his mind completely. Put in a tough financial situation older brother and wife see what they can do. Therapist noted there is the relief of stress from the job. Terminal illness give a lump sum have to look at policy. Start in two weeks. Has never been through before so doesn't know what it will be like hopes to have the mental energy hope to push past how tired and sick, hope to have the energy level. There is the anxiety there. It was rough. On him until the surprise. Didn't discuss the issue with supports but knew something off not himself. Thinks will have to keep his mind off it will help. Hopefully when time comes  everything is figured out and in order scheduled and ready to go for him. Feels have to speak about it. [facility] will still be provider even if going to [facility].  Shared about the party that it felt like life knew what was going through and showed up with a party. Talked about how he  predicts things people think he has a gift but not too spiritual himself just doesn't know. Something comes over him and speaks about it. People call back and say happened like he said. Therapist asked how he uses the gift he feels has been can tell somebody something happened and coming his way. Receive text message thank you everything said didn't want to listen but helped those things happened and prepared to deal with the time came. Both therapist and patient noted how things for [name] turn up, come to you in the ways that they are going to come can't predict. Patient said gift doesn't work if profit from it. Can predict something come up for him knows for example has an appointment knows may be more difficult to get there but makes it does produce some anxiety.When happens to him smile you knew if even if anxiety is there. Things happen not small things things happen to truck not in a small way that interfere.Feeling that even if stop working feeling that they will be ok not a change in financial decline. Wife does well financially maybe a strain thinks what may happen he may have a way to contribute. s. Talked about his kids and supporting  them. 39 year old started to work see money haven't seen before. 40 year old with them and 41 year old supports and in [place]. 3 biological kids 46, year old 17, year old and 53 year old. Wife's oldest son was taking care of who is 14 just wanted to see what would like to be with his father.  Patient says been through a lot and this is the most serious. Have to go though it both therapist and patient agree cancer can be like the boogeyman.   Patient found out test results he does have cancer facing  significant stressor discussed coping noted he had his birthday that came just to the right moment in his life when he found out the news helped his mind get off of it.  Noted percentages are high for good outcome and therapist pointed out the sooner he gets started and will start in 2 weeks.  Noted there is some unknowns and hopes for things like the energy to get through it, financial issues but talked about one of the things patient has is can see things coming feels like they will be okay financially that helps with some sense that things will be okay.  Also family support of.  This situation led to therapist learning more about patient says he can predict things help other people people will call him back and then come.  This is a positive being able to help people in his life does not work if he has financial gain unfortunately.  Assess helpful for patient to get his emotions and feelings out he says this is probably the hardest thing he went through as we have verbalized how it feels therapist agrees with patient cancer can be like the boogie man and unknown and scary.  Patient realizes has to just go through it and therapist said learning as he goes things that may make him stronger help him with his life.  Therapist provided active listening open questions supportive interventions.       Suicidal/Homicidal: No  Plan: Return again in 2 weeks.2.  Look at anxiety chapter in book can look at mindfulness chapter EMDR book CBT chapters thought distortions and self-esteem  Diagnosis: Major depressive disorder, recurrent, moderate, generalized anxiety disorder, PTSD depression and anxiety  Collaboration of Care: Other none needed  Patient/Guardian was advised Release of Information must be obtained prior to any record release in order to collaborate their care with an outside provider. Patient/Guardian was advised if they have not already done so to contact the registration department to sign all  necessary forms in order for [name] to release information regarding their care.   Consent: Patient/Guardian gives verbal consent for treatment and assignment of benefits for services provided during this visit. Patient/Guardian expressed understanding and agreed to proceed.   Coolidge Breeze, LCSW 08/29/2023

## 2023-08-30 ENCOUNTER — Encounter (HOSPITAL_COMMUNITY): Payer: Self-pay | Admitting: Psychiatry

## 2023-08-30 ENCOUNTER — Ambulatory Visit (INDEPENDENT_AMBULATORY_CARE_PROVIDER_SITE_OTHER): Payer: BC Managed Care – PPO | Admitting: Psychiatry

## 2023-08-30 VITALS — BP 107/69 | HR 66 | Ht 68.0 in | Wt 173.0 lb

## 2023-08-30 DIAGNOSIS — F4310 Post-traumatic stress disorder, unspecified: Secondary | ICD-10-CM

## 2023-08-30 DIAGNOSIS — F331 Major depressive disorder, recurrent, moderate: Secondary | ICD-10-CM | POA: Diagnosis not present

## 2023-08-30 DIAGNOSIS — F411 Generalized anxiety disorder: Secondary | ICD-10-CM | POA: Diagnosis not present

## 2023-08-30 MED ORDER — PAROXETINE HCL ER 12.5 MG PO TB24: 12.5000 mg | ORAL_TABLET | Freq: Every day | ORAL | 0 refills | Status: DC
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
## 2023-09-02 ENCOUNTER — Telehealth: Payer: Self-pay | Admitting: Radiation Oncology

## 2023-09-02 NOTE — Telephone Encounter (Signed)
10/4 @ 8:56 am Received referral from Dr. Timoteo Ace office Path and CT images done, but no PET images.  Called and spoke to Delice Lesch, [facility] ENT intake. She stated PET scan orders are in for [facility], they are working on authorization for approval before schedule.

## 2023-09-05 ENCOUNTER — Telehealth: Payer: Self-pay | Admitting: Radiation Oncology

## 2023-09-05 NOTE — Telephone Encounter (Signed)
10/7 @ 3:40 pm Received call from Witt from [facility] ENT intake.  She stated patient is now scheduled on 10/8 for PET scan at [facility].  Will reach out to patient to get him schedule for consult thereafter.

## 2023-09-06 ENCOUNTER — Telehealth: Payer: Self-pay | Admitting: Radiation Oncology

## 2023-09-06 NOTE — Progress Notes (Signed)
Head and Neck Cancer Location of Tumor / Histology:  US Soft Tissue Head/Neck (NON-THYROID) 07/07/2023  IMPRESSION: Palpable lump in the left submandibular region corresponds to abnormally enlarged lymph node. Further evaluation with contrast enhanced soft tissue neck CT is recommended.  CT Soft Tissue Neck W Contrast 07/20/2023  IMPRESSION: 1. 2.5 x 3 cm mass in the left tonsillar region with extension towards the tongue base, worrisome for carcinoma. 2. Level 2 lymphadenopathy on the left with the dominant node measuring 3 x 2 x 1.7 cm. 3. Benign appearing osteoma projecting inferior from the angle of the mandible on the left. This is not visibly enlarged compared to the study of 2018. 4. These results will be called to the ordering clinician or representative by the Radiologist Assistant, and communication documented in the PACS or [employer].  09/07/2023 PET CT Skull Base to Thigh  Impression  -Intense radiotracer uptake within the left palatine tonsillar mass with adjacent radiotracer uptake within the region of the right palatine tonsil. There are hypermetabolic nodes in the left neck extending to the supraclavicular region which are concerning for nodal metastases. -Focal uptake at the anorectal junction may reflect muscular contraction, but a local malignancy is difficult to exclude. - No other evidence for more distant metastases.   Patient presented with symptoms of: Dysphagia,  Harry Williams is a 42 y.o. male who presented to his PCP this past July 2024 and was incidentally noted to have left submandibular swelling on a routine examination performed at that time   Biopsies revealed:  08-26-23 Diagnosis   A: Left tonsil, biopsy - Invasive squamous cell carcinoma, HPV-associated (see comment)   This electronic signature is attestation that the pathologist personally reviewed the submitted material(s) and the final diagnosis reflects that evaluation.  Electronically  signed by Alyce Pagan, MD on 08/31/2023 at 11:01 AM Preliminary result electronically signed by Alyce Pagan, MD on 08/30/2023 at  2:21 PM  Diagnosis Comment   The tumor cells are positive for p16 and HPV 16/18 (ISH), supporting the diagnosis.  Clinical History   Left tonsil lesion    Nutrition Status Yes No Comments  Weight changes? [x]  []  Has lost 6lbs and not trying  Swallowing concerns? [x]  []  Feels like food gets stuck, feels like pills stops  PEG? []  [x]     Referrals Yes No Comments  Social Work? []  [x]    Dentistry? []  [x]  Has not been recently  Swallowing therapy? [x]  []    Nutrition? [x]  []    Med/Onc? [x]  []     Safety Issues Yes No Comments  Prior radiation? []  [x]    Pacemaker/ICD? []  [x]    Possible current pregnancy? []  [x]    Is the patient on methotrexate? []  [x]     Tobacco/Marijuana/Snuff/ETOH use: has never used   Past/Anticipated interventions by otolaryngology, if any: Pt has been working with Dr. Roma Schanz with [facility] for studies and testing. Pt had biopsy on 08-26-23.   Past/Anticipated interventions by medical oncology, if any: Pt to see Dr. [name] on 09-15-23  Current Complaints / other details:  Wanted Pet scan results discussed. Pt has already has issues with joint paint and will radiation make it worse. Pt only operates with small intestine.     Vitals:   09/14/23 0935  BP: 114/78  Pulse: 62  Resp: 18  Temp: (!) 97.3 F (36.3 C)  SpO2: 100%

## 2023-09-06 NOTE — Telephone Encounter (Signed)
10/8 @ 10:45 am Left voicemail for patient to call our office to be schedule for consult.

## 2023-09-09 ENCOUNTER — Inpatient Hospital Stay
Admission: RE | Admit: 2023-09-09 | Discharge: 2023-09-09 | Disposition: A | Discharge status: Home / Self Care | Payer: Self-pay | Source: Ambulatory Visit | Attending: Radiation Oncology | Admitting: Radiation Oncology

## 2023-09-09 ENCOUNTER — Telehealth: Payer: Self-pay | Admitting: Radiation Oncology

## 2023-09-09 ENCOUNTER — Other Ambulatory Visit: Payer: Self-pay

## 2023-09-09 DIAGNOSIS — C099 Malignant neoplasm of tonsil, unspecified: Secondary | ICD-10-CM

## 2023-09-09 NOTE — Telephone Encounter (Signed)
10/11 sent via stat fax request for recent PET images for 10/9 to be push to [facility] from [facility] radiology.  Waiting on images.

## 2023-09-12 ENCOUNTER — Ambulatory Visit (INDEPENDENT_AMBULATORY_CARE_PROVIDER_SITE_OTHER): Payer: BC Managed Care – PPO | Admitting: Licensed Clinical Social Worker

## 2023-09-12 DIAGNOSIS — F419 Anxiety disorder, unspecified: Secondary | ICD-10-CM

## 2023-09-12 DIAGNOSIS — F411 Generalized anxiety disorder: Secondary | ICD-10-CM

## 2023-09-12 DIAGNOSIS — F4310 Post-traumatic stress disorder, unspecified: Secondary | ICD-10-CM | POA: Diagnosis not present

## 2023-09-12 DIAGNOSIS — F331 Major depressive disorder, recurrent, moderate: Secondary | ICD-10-CM | POA: Diagnosis not present

## 2023-09-12 NOTE — Progress Notes (Signed)
THERAPIST PROGRESS NOTE  Session Time: 1:00 PM to 1:45 PM  Participation Level: Active  Behavioral Response: CasualAlertappropriate  Type of Therapy: Individual Therapy  Treatment Goals addressed: PTSD, anxiety, depression relationship issues, coping  ProgressTowards Goals: Progressing-assess helpful to have therapy patient has upcoming significant stressor process thoughts and feelings noted exploring ways to keep occupied and staying positive is helpful  Interventions: DBT, Solution Focused, Strength-based, Supportive, and Other: coping  Summary: Harry Williams is a 42 y.o. male who presents with been to the doctor a few times since last spoke. Have to see different doctors then next three days. Team working with him doctors in different fields. They are saying this is serious also has to see a speech therapist. Has to go to [facility] then a referral to go to [facility] cancer center. They said their doctor who does ear, nose throat refers to [facility]. Four doctors. Also did PET scan for the whole body saying they don't know for sure but need to do further testing found cancer in stomach. Patient had the surgery before polyps growing around intestine preventative before. Family trait checked for it.  Patient did this test because it runs in the family and he was worse off then father was. Removed the large intestine and colon. Had over 1000 polyps in large intestine no way to burn them so removed them. Afterwards therapist asked if any complications he relates at first have to go to restroom 20 times a day. Over time able to recontrol stomach and muscles slow down but still can be an issue. Sometimes cause irritation, bleeding always can reach out to doctor at [facility]. Hopefully nothing from this test patient expressed his concern only operating with small intestine. Doctor appointment overwhelming without help of wife would feel it is too much. Start going to specialist tomorrow for next three days. Every  day is a different one. Treatment will be chemo and radiation thought starting tomorrow but specialist still. It is rough. Has FMLA right now to get appointments until full time treatment. Wife is in the medical field so understands this fortunately. Doesn't know what is going happen right now ok doesn't know when treatments starts what body is going do. Haven't given him a level for the cancer yet. Tough road patient says part of normal life. Pain people say don't think like that on resume whole life pain not making up on chart. Brings resilience why not in panic all know as pain this is just notch. Feels my life been up road route no time level out. In future things start and calm probably feel the best in his life doesn't know what that will feel like.  Go need something keep him occupy.  Explored what that means to him patient says not standing still being productive being out. Hard to stay motivated on his feet can't imagine with meds how that will affect his energy how keep energy to stay occupied. Even puzzle, never been down road doesn't know but engage in activities as much as possible to keep mind from deteriorating like memory loss keep what have. Willing to tackle engaging in activity whatever comes his way. Rough road advice has been maybe a time though to think about his life what is coming next, possible rethink things. Talked about keeping his mind open of things to help him stay occupied. Will watch things like animal videos. Maybe looking at something filming nice scenery. Reviewed session patient patient said remain in a positive state of  mind not only patient's own thought people around say stay positive helps with healing and therapist agreed it is important element.   Reviewed upcoming events with patient to know schedule for treatment as patient said it is a lot overwhelming helps to have the support of his wife to do everything he needs to do.  Noted most important thing patient said when  something up session is to stay positive and therapist said this really does impact healing something he can do for himself.  Validated patient on the many challenges of his life in some ways this new medical issue is like another added onto the many before both medical and emotionally therapist noted there is a resilience from it and he agrees.  Patient shared more about other medical procedures again noting how much she has been through.  Patient noted it might be a time for reflection not sure though how well his body is going to tolerate and will have to see.  Therapist explored coping mechanisms he can use that have helped him to destress patient did not identify any particular but does want to stay occupied therapist noted this is good insight reviewed distress tolerance skills which would work in the situation for distraction as well as using senses to self sooth.  Therapist explained concept of distress tolerance is when emotions have escalated and pain it is a way for you to de-escalate and also give you time to find good coping.  Brainstorm some activities he may enjoy and positive that he wants to engage in some of his able.  Suicidal/Homicidal: No  Plan: Return again in 4 weeks.2.  Look at thoughts and feelings book anxiety chapter, CBT thought distortion CPT EMDR book after mindfulness  Diagnosis: Major depressive disorder, recurrent, moderate, generalized anxiety disorder, PTSD depression and anxiety   Collaboration of Care: Medication Management AEB review of Dr. Gilmore Laroche note  Patient/Guardian was advised Release of Information must be obtained prior to any record release in order to collaborate their care with an outside provider. Patient/Guardian was advised if they have not already done so to contact the registration department to sign all necessary forms in order for [name] to release information regarding their care.   Consent: Patient/Guardian gives verbal consent for treatment and  assignment of benefits for services provided during this visit. Patient/Guardian expressed understanding and agreed to proceed.   Coolidge Breeze, LCSW 09/12/2023

## 2023-09-13 NOTE — Progress Notes (Signed)
Radiation Oncology         [phone] ________________________________  Initial Outpatient Consultation  Name: Harry Williams MRN: [id number]  Date: 09/14/2023  DOB: [birth year]  KG:[id number], Harry Bob, MD  Erskin Burnet*   REFERRING PHYSICIAN: Erskin Burnet*  DIAGNOSIS:    ICD-10-CM   1. 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   CHIEF COMPLAINT: Here to discuss management of oropharyngeal cancer  HISTORY OF PRESENT ILLNESS::Harry Williams is a 42 y.o. male who presented to his PCP this past July 2024 and was incidentally noted to have left submandibular swelling on a routine examination performed at that time. A soft tissue head and neck ultrasound was subsequently performed on 07/06/33 which showed an ovoid mass measuring 2.8 x 1.9 cm, most consistent with an abnormally enlarged lymph node, and correlating with the palpable area in the left submandibular region.   A soft tissue neck CT was also performed on 07/20/23 which demonstrated: a  2.5 x 3 cm mass in the left tonsillar region with extension towards the tongue base, suspicious for carcinoma, and evidence of level 2 lymphadenopathy on the left, consisting of a dominant node measuring 3 x 2 x 1.7 cm. A benign appearing osteoma was also demonstrated, projecting inferior from the angle of the left mandible.  Subsequently, the patient was referred to Dr. Roma Schanz at [facility] and underwent biopsies of the left tonsil on 08/26/23. 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   Pertinent imaging thus far includes a PET scan performed on 09/07/23 at [facility] which showed: intense radiotracer uptake within the left palatine tonsillar mass; adjacent  radiotracer uptake within the region of the right palatine tonsil; and hypermetabolic nodes in the left neck extending to the supraclavicular region concerning for nodal metastases. Focal uptake at the anorectal junction was also demonstrated, possibly reflecting muscular contraction, however local malignancy can not be excluded. PET otherwise showed no evidence of more distant metastatic disease.   Swallowing issues, if any: none  Weight Changes: none  Pain status: ***  Other symptoms: palpable lump in the left submandibular region  Tobacco history, if any: none  ETOH abuse, if any: drinks on occasion   Prior cancers, if any: none  PREVIOUS RADIATION THERAPY: No  PAST MEDICAL HISTORY:  has a past medical history of Anxiety, Asthma, Avascular necrosis of hip, left (HCC) (07/24/2021), Bright red blood per rectum (12/16/2022), Closed fracture of nasal bone with routine healing (12/24/2016), Depression, and PTSD (post-traumatic stress disorder).    PAST SURGICAL HISTORY: Past Surgical History:  Procedure Laterality Date   APPENDECTOMY     COLON SURGERY  03/30/2015   JOINT REPLACEMENT  2008   partial right elbow; right femoral graft   Total abdominal proctocolectomy, J-pouch, ileal pouch anal anastomosis  2016    FAMILY HISTORY: family history includes Anemia in his mother; Arthritis in his mother; Asthma in his daughter; Cancer in his paternal grandfather; Diabetes in his father; Heart disease in his paternal grandmother; Hypertension in his brother.  SOCIAL HISTORY:  reports that he has never smoked. He has never used smokeless tobacco. He reports current alcohol use. He reports that he does not use drugs.  ALLERGIES: Patient has no known allergies.  MEDICATIONS:  Current Outpatient Medications  Medication Sig Dispense Refill   PARoxetine (PAXIL CR) 12.5 MG 24 hr tablet Take 1 tablet (12.5 mg total) by mouth daily. 30 tablet 0   Vitamin D, Ergocalciferol, (DRISDOL) 1.25 MG  (50000 UNIT) CAPS capsule Take 1 capsule (50,000 Units total) by mouth every 7 (seven) days. Take for 8 total doses(weeks) 8 capsule 0   No current facility-administered medications for this encounter.    REVIEW OF SYSTEMS:  Notable for that above.   PHYSICAL EXAM:  vitals were not taken for this visit.   General: Alert and oriented, in no acute distress HEENT: Head is normocephalic. Extraocular movements are intact. Oropharynx is notable for ***. Neck: Neck is notable for *** Heart: Regular in rate and rhythm with no murmurs, rubs, or gallops. Chest: Clear to auscultation bilaterally, with no rhonchi, wheezes, or rales. Abdomen: Soft, nontender, nondistended, with no rigidity or guarding. Extremities: No cyanosis or edema. Lymphatics: see Neck Exam Skin: No concerning lesions. Musculoskeletal: symmetric strength and muscle tone throughout. Neurologic: Cranial nerves II through XII are grossly intact. No obvious focalities. Speech is fluent. Coordination is intact. Psychiatric: Judgment and insight are intact. Affect is appropriate.   ECOG = ***  0 - Asymptomatic (Fully active, able to carry on all predisease activities without restriction)  1 - Symptomatic but completely ambulatory (Restricted in physically strenuous activity but ambulatory and able to carry out work of a light or sedentary nature. For example, light housework, office work)  2 - Symptomatic, <50% in bed during the day (Ambulatory and capable of all self care but unable to carry out any work activities. Up and about more than 50% of waking hours)  3 - Symptomatic, >50% in bed, but not bedbound (Capable of only limited self-care, confined to bed or chair 50% or more of waking hours)  4 - Bedbound (Completely disabled. Cannot carry on any self-care. Totally confined to bed or chair)  5 - Death   Santiago Glad MM, Creech RH, Tormey DC, et al. [phone]). "Toxicity and response criteria of the [facility]".  Am. Evlyn Clines. Oncol. 5 (6): 649-55   LABORATORY DATA:  Lab Results  Component Value Date   WBC 4.6 06/17/2023   HGB 14.9 06/17/2023   HCT 45.2 06/17/2023   MCV 93 06/17/2023   PLT 220 06/17/2023   CMP     Component Value Date/Time   NA 139 06/17/2023 0936   K 4.8 06/17/2023 0936   CL 101 06/17/2023 0936   CO2 27 06/17/2023 0936   GLUCOSE 87 06/17/2023 0936   GLUCOSE 87 07/09/2021 0803   BUN 9 06/17/2023 0936   CREATININE 1.13 06/17/2023 0936   CALCIUM 9.9 06/17/2023 0936   PROT 7.0 06/17/2023 0936   ALBUMIN 4.4 06/17/2023 0936   AST 22 06/17/2023 0936   ALT 18 06/17/2023 0936   ALKPHOS 72 06/17/2023 0936   BILITOT 1.0 06/17/2023 0936   GFRNONAA >60 07/09/2021 0803   GFRAA >60 12/19/2016 0514      Lab Results  Component Value Date   TSH 2.470 06/17/2023     RADIOGRAPHY: No results found.    IMPRESSION/PLAN:  This is a delightful patient with head and neck cancer. I *** recommend radiotherapy for this patient.  We discussed the potential risks, benefits, and side effects of radiotherapy. We talked in detail about acute and late effects. We discussed that some of the most bothersome acute effects may be mucositis, dysgeusia, salivary changes, skin irritation, hair loss, dehydration, weight loss and  fatigue. We talked about late effects which include but are not necessarily limited to dysphagia, hypothyroidism, nerve injury, vascular injury, spinal cord injury, xerostomia, trismus, neck edema, dental issues, non-healing wound, and potentially fatal injury to any of the tissues in the head and neck region. No guarantees of treatment were given. A consent form was signed and placed in the patient's medical record. The patient is enthusiastic about proceeding with treatment. I look forward to participating in the patient's care.    Simulation (treatment planning) will take place ***  We also discussed that the treatment of head and neck cancer is a multidisciplinary process  to maximize treatment outcomes and quality of life. For this reason the following referrals have been or will be made:  *** Medical oncology to discuss chemotherapy   *** Dentistry for dental evaluation, possible extractions in the radiation fields, and /or advice on reducing risk of cavities, osteoradionecrosis, or other oral issues.  *** Nutritionist for nutrition support during and after treatment.  *** Speech language pathology for swallowing and/or speech therapy.  *** Social work for social support.   *** Physical therapy due to risk of lymphedema in neck and deconditioning.  *** Baseline labs including TSH.  On date of service, in total, I spent *** minutes on this encounter. Patient was seen in person.  __________________________________________   [name], MD  This document serves as a record of services personally performed by [name], MD. It was created on her behalf by Neena Rhymes, a trained medical scribe. The creation of this record is based on the scribe's personal observations and the provider's statements to them. This document has been checked and approved by the attending provider.

## 2023-09-14 ENCOUNTER — Encounter: Payer: Self-pay | Admitting: Radiation Oncology

## 2023-09-14 ENCOUNTER — Other Ambulatory Visit: Payer: Self-pay

## 2023-09-14 ENCOUNTER — Ambulatory Visit
Admission: RE | Admit: 2023-09-14 | Discharge: 2023-09-14 | Disposition: A | Discharge status: Home / Self Care | Payer: BC Managed Care – PPO | Source: Ambulatory Visit | Attending: Radiation Oncology | Admitting: Radiation Oncology

## 2023-09-14 VITALS — BP 114/78 | HR 62 | Temp 97.3°F | Resp 18 | Ht 68.0 in | Wt 176.4 lb

## 2023-09-14 DIAGNOSIS — F4310 Post-traumatic stress disorder, unspecified: Secondary | ICD-10-CM | POA: Insufficient documentation

## 2023-09-14 DIAGNOSIS — J45909 Unspecified asthma, uncomplicated: Secondary | ICD-10-CM | POA: Diagnosis not present

## 2023-09-14 DIAGNOSIS — Z79899 Other long term (current) drug therapy: Secondary | ICD-10-CM | POA: Diagnosis not present

## 2023-09-14 DIAGNOSIS — Z809 Family history of malignant neoplasm, unspecified: Secondary | ICD-10-CM | POA: Insufficient documentation

## 2023-09-14 DIAGNOSIS — C090 Malignant neoplasm of tonsillar fossa: Secondary | ICD-10-CM | POA: Diagnosis present

## 2023-09-14 DIAGNOSIS — K625 Hemorrhage of anus and rectum: Secondary | ICD-10-CM | POA: Insufficient documentation

## 2023-09-14 DIAGNOSIS — C099 Malignant neoplasm of tonsil, unspecified: Secondary | ICD-10-CM

## 2023-09-14 DIAGNOSIS — Z1509 Genetic susceptibility to other malignant neoplasm: Secondary | ICD-10-CM | POA: Insufficient documentation

## 2023-09-14 NOTE — Progress Notes (Signed)
Oncology Nurse Navigator Documentation   Met with patient during initial consult with Dr. Basilio Cairo.  Harry Williams was accompanied by his wife, Harry Williams.  I introduced myself as his/their Navigator, explained my role as a member of the Care Team. Provided New Patient resource guide binder: Contact information for physicians, this navigator, other members of the Care Team Advance Directive information; provided [facility] AD booklet at their request,  Fall Prevention Patient Safety Plan Financial Assistance Information sheet Symptom Management Clinic information [facility] campus map with highlight of [facility] SLP Information sheet Head and Neck cancer basics Nutrition information Patient and family support information including Spiritual care/Chaplain information, Peer mentor program, health and wellness classes, and the survivorship program Community resources  Provided and discussed educational handouts for PEG and PAC. Assisted with post-consult appt scheduling. I toured him to the [facility] treatment area, explained procedures for lobby registration, arrival to Radiation Waiting, and arrival to treatment area.    They verbalized understanding of information provided. I encouraged them to call with questions/concerns moving forward.  Hedda Slade, RN, BSN, OCN Head & Neck Oncology Nurse Navigator [facility] [place] [phone]

## 2023-09-15 ENCOUNTER — Encounter: Payer: Self-pay | Admitting: Oncology

## 2023-09-15 ENCOUNTER — Inpatient Hospital Stay: Payer: BC Managed Care – PPO | Attending: Oncology | Admitting: Oncology

## 2023-09-15 ENCOUNTER — Other Ambulatory Visit: Payer: Self-pay

## 2023-09-15 ENCOUNTER — Inpatient Hospital Stay: Payer: BC Managed Care – PPO

## 2023-09-15 VITALS — BP 114/75 | HR 68 | Temp 98.0°F | Resp 20 | Wt 180.1 lb

## 2023-09-15 DIAGNOSIS — Z79899 Other long term (current) drug therapy: Secondary | ICD-10-CM | POA: Diagnosis not present

## 2023-09-15 DIAGNOSIS — Z5111 Encounter for antineoplastic chemotherapy: Secondary | ICD-10-CM | POA: Diagnosis not present

## 2023-09-15 DIAGNOSIS — D1391 Familial adenomatous polyposis: Secondary | ICD-10-CM | POA: Diagnosis not present

## 2023-09-15 DIAGNOSIS — C090 Malignant neoplasm of tonsillar fossa: Secondary | ICD-10-CM

## 2023-09-15 DIAGNOSIS — Z510 Encounter for antineoplastic radiation therapy: Secondary | ICD-10-CM | POA: Insufficient documentation

## 2023-09-15 DIAGNOSIS — E559 Vitamin D deficiency, unspecified: Secondary | ICD-10-CM | POA: Diagnosis not present

## 2023-09-15 DIAGNOSIS — Z7952 Long term (current) use of systemic steroids: Secondary | ICD-10-CM | POA: Insufficient documentation

## 2023-09-15 LAB — CBC WITH DIFFERENTIAL/PLATELET
Abs Immature Granulocytes: 0.01 10*3/uL (ref 0.00–0.07)
Basophils Absolute: 0.1 10*3/uL (ref 0.0–0.1)
Basophils Relative: 1 %
Eosinophils Absolute: 0.1 10*3/uL (ref 0.0–0.5)
Eosinophils Relative: 3 %
HCT: 42.8 % (ref 39.0–52.0)
Hemoglobin: 15.2 g/dL (ref 13.0–17.0)
Immature Granulocytes: 0 %
Lymphocytes Relative: 42 %
Lymphs Abs: 2.1 10*3/uL (ref 0.7–4.0)
MCH: 31.1 pg (ref 26.0–34.0)
MCHC: 35.5 g/dL (ref 30.0–36.0)
MCV: 87.7 fL (ref 80.0–100.0)
Monocytes Absolute: 0.6 10*3/uL (ref 0.1–1.0)
Monocytes Relative: 12 %
Neutro Abs: 2.1 10*3/uL (ref 1.7–7.7)
Neutrophils Relative %: 42 %
Platelets: 205 10*3/uL (ref 150–400)
RBC: 4.88 MIL/uL (ref 4.22–5.81)
RDW: 12.7 % (ref 11.5–15.5)
WBC: 4.9 10*3/uL (ref 4.0–10.5)
nRBC: 0 % (ref 0.0–0.2)

## 2023-09-15 LAB — COMPREHENSIVE METABOLIC PANEL
ALT: 19 U/L (ref 0–44)
AST: 20 U/L (ref 15–41)
Albumin: 4.4 g/dL (ref 3.5–5.0)
Alkaline Phosphatase: 59 U/L (ref 38–126)
Anion gap: 4 — ABNORMAL LOW (ref 5–15)
BUN: 8 mg/dL (ref 6–20)
CO2: 30 mmol/L (ref 22–32)
Calcium: 9.6 mg/dL (ref 8.9–10.3)
Chloride: 105 mmol/L (ref 98–111)
Creatinine, Ser: 1.05 mg/dL (ref 0.61–1.24)
GFR, Estimated: >60 mL/min (ref >60)
Glucose, Bld: 90 mg/dL (ref 70–99)
Potassium: 4.2 mmol/L (ref 3.5–5.1)
Sodium: 139 mmol/L (ref 135–145)
Total Bilirubin: 0.6 mg/dL (ref 0.3–1.2)
Total Protein: 7.4 g/dL (ref 6.5–8.1)

## 2023-09-15 LAB — TSH: TSH: 3.827 u[IU]/mL (ref 0.350–4.500)

## 2023-09-15 MED ORDER — PROCHLORPERAZINE MALEATE 10 MG PO TABS: 10.0000 mg | ORAL_TABLET | Freq: Four times a day (QID) | ORAL | 1 refills | Status: DC | PRN

## 2023-09-15 MED ORDER — LIDOCAINE-PRILOCAINE 2.5-2.5 % EX CREA: TOPICAL_CREAM | CUTANEOUS | 3 refills | Status: DC

## 2023-09-15 MED ORDER — DEXAMETHASONE 4 MG PO TABS: ORAL_TABLET | ORAL | 1 refills | Status: DC

## 2023-09-15 MED ORDER — ONDANSETRON HCL 8 MG PO TABS: 8.0000 mg | ORAL_TABLET | Freq: Three times a day (TID) | ORAL | 1 refills | Status: DC | PRN

## 2023-09-15 NOTE — Assessment & Plan Note (Addendum)
-  Underwent total colectomy in 2016. Currently experiencing watery bowel movements approximately seven times a day. -Plan to refer to a genetic counselor after completion of tonsillar cancer treatments.

## 2023-09-15 NOTE — Progress Notes (Signed)
Has armband been applied?  Yes.    Does patient have an allergy to IV contrast dye?: No.   Has patient ever received premedication for IV contrast dye?: No.   Does patient take metformin?: No.  If patient does take metformin when was the last dose: na  Date of lab work: 09-15-23 BUN: 8 CR: 1.05 eGfr: >60   IV site:  left antecubital   Has IV site been added to flowsheet?  Yes.    BP 111/85 (BP Location: Left Arm, Patient Position: Sitting, Cuff Size: Large)   Pulse 63   Temp 97.6 F (36.4 C)   Resp 18   Ht 5\' 8"  (1.727 m)   Wt 176 lb (79.8 kg)   SpO2 100%   BMI 26.76 kg/m

## 2023-09-15 NOTE — Assessment & Plan Note (Addendum)
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  -Today I discussed diagnosis, staging, prognosis, plan of care.  Reviewed NCCN guidelines and discussed treatment options.  Discussed that the head and neck cancer treatments involves multidisciplinary approach -Discussed the benefits and risks of radiation alone vs combination of chemotherapy and radiation. Given the patient's age and the size of the lymph node which is borderline at 3 cm, the benefits of adding chemotherapy slightly outweigh the risks. -Discussed side effect profile with cisplatin including, but not limited to, nausea, vomiting, electrolyte disturbances, renal dysfunction, tinnitus, hearing loss, neuropathy, cytopenias etc.  After discussing side effect profile, patient did opt for weekly cisplatin.  Plan is to proceed with weekly cisplatin at 40 mg/m dose during the course of radiation.  He will receive appropriate pre and post hydration and IV nausea medications. -Will arrange for Port-A-Cath placement, feeding tube placement and chemo education. -Prescriptions sent for nausea medications, Emla cream. -Advised to stay hydrated, aiming for up to three liters of water or non-caffeinated drinks per day, especially during chemotherapy. -CBC, CMP, TSH are all unremarkable today.

## 2023-09-15 NOTE — Progress Notes (Signed)
[facility]  ONCOLOGY CONSULT NOTE   PATIENT NAME: Harry Williams   MR#: [id number] DOB: [birth year]  DATE OF SERVICE: 09/15/2023   Patient Care Team: [name], MD as PCP - General (Family Medicine) Noe Gens, MD as Referring Physician (Otolaryngology) [name], MD as Attending Physician (Radiation Oncology)    CHIEF COMPLAINT/ PURPOSE OF CONSULTATION:   Newly diagnosed left tonsillar squamous cell carcinoma, p16 positive, stage I.  HISTORY OF PRESENTING ILLNESS:   Harry Williams is a very pleasant 42 y.o. gentleman with a past medical history of familial adenomatous polyposis status post total colectomy in 2016, vitamin D deficiency, was referred to our clinic for newly diagnosed left tonsillar squamous cell carcinoma, p16 positive, stage I.  The cancer was discovered during a routine exam with the patient's primary doctor, after the patient reported feeling swelling in the back of his throat and difficulty swallowing. The patient reports no pain in the affected area.  Initially he had an ultrasound of the neck followed by CT neck on 07/20/2023 which showed 3 cm mass in the left tonsillar region on 3 cm left-sided neck lymphadenopathy.  He was later evaluated by Dr. Roma Schanz at [facility], who performed biopsy of left tonsil on 08/26/2023 which confirmed squamous cell carcinoma.  The patient's bowel movements have been affected by his previous colectomy, with frequent, watery stools reported. 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  He denies any hearing deficit, difficulties with chewing food, painful swallowing, changes in the quality of his voice or abnormal weight loss.  I have reviewed his chart and materials related to his cancer extensively and collaborated history with the patient. Summary of oncologic history is as follows:  Oncology History  Malignant neoplasm of tonsillar fossa (HCC)  07/20/2023  Imaging   CT neck soft tissue: 2.5 x 3 cm mass in the left tonsillar region with extension towards the tongue base, suspicious for carcinoma, and evidence of level 2 lymphadenopathy on the left, consisting of a dominant node measuring 3 x 2 x 1.7 cm. A benign appearing osteoma was also demonstrated, projecting inferior from the angle of the left mandible.    08/26/2023 Initial Diagnosis   Malignant neoplasm of tonsillar fossa [facility]):  He presented to his PCP for routine follow-up in July 2024 and was incidentally noted to have left submandibular swelling on a routine examination performed at that time. A soft tissue head and neck ultrasound was subsequently performed on 07/07/23 which showed an ovoid mass measuring 2.8 x 1.9 cm, most consistent with an abnormally enlarged lymph node, and correlating with the palpable area in the left submandibular region.    A soft tissue neck CT was also performed on 07/20/23 which demonstrated: a  2.5 x 3 cm mass in the left tonsillar region with extension towards the tongue base, suspicious for carcinoma, and evidence of level 2 lymphadenopathy on the left, consisting of a dominant node measuring 3 x 2 x 1.7 cm. A benign appearing osteoma was also demonstrated, projecting inferior from the angle of the left mandible.   Subsequently, the patient was referred to Dr. Roma Schanz at [facility] and underwent biopsies of the left tonsil on 08/26/23. 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    08/26/2023 Pathology Results   Patient was referred to Dr. Roma Schanz at [facility] and underwent biopsies of the left tonsil on 08/26/23. Pathology revealed findings consistent with HPV  associated invasive squamous cell carcinoma (p16 positive).    09/07/2023 PET scan   PET scan performed on 09/07/23 at [facility]: intense radiotracer uptake within the left palatine tonsillar mass; adjacent radiotracer uptake within the region of the  right palatine tonsil; and hypermetabolic nodes in the left neck extending to the supraclavicular region concerning for nodal metastases. Focal uptake at the anorectal junction was also demonstrated, possibly reflecting muscular contraction, however local malignancy can not be excluded. PET otherwise showed no evidence of more distant metastatic disease.    09/14/2023 Cancer Staging   Staging form: Pharynx - HPV-Mediated Oropharynx, AJCC 8th Edition - Clinical stage from 09/14/2023: Stage I (cT2, cN1, cM0, p16+) - Signed by [name], MD on 09/14/2023 Stage prefix: Initial diagnosis   09/27/2023 -  Chemotherapy   Patient is on Treatment Plan : HEAD/NECK Cisplatin (40) q7d       MEDICAL HISTORY:  Past Medical History:  Diagnosis Date   Anxiety    Asthma    Avascular necrosis of hip, left (HCC) 07/24/2021   Bright red blood per rectum 12/16/2022   Closed fracture of nasal bone with routine healing 12/24/2016   Depression    PTSD (post-traumatic stress disorder)     SURGICAL HISTORY: Past Surgical History:  Procedure Laterality Date   APPENDECTOMY     COLON SURGERY  03/30/2015   JOINT REPLACEMENT  2008   partial right elbow; right femoral graft   Total abdominal proctocolectomy, J-pouch, ileal pouch anal anastomosis  2016    SOCIAL HISTORY: Social History   Socioeconomic History   Marital status: Married    Spouse name: Teacher, English as a foreign language   Number of children: 3   Years of education: 12+   Highest education level: Some college, no degree  Occupational History   Not on file  Tobacco Use   Smoking status: Never   Smokeless tobacco: Never  Vaping Use   Vaping status: Never Used  Substance and Sexual Activity   Alcohol use: Yes    Comment: Occasional   Drug use: Never   Sexual activity: Yes    Partners: Female  Other Topics Concern   Not on file  Social History Narrative   ** Merged History Encounter **       Social Determinants of Health   Financial Resource  Strain: Not on file  Food Insecurity: No Food Insecurity (04/01/2022)   Hunger Vital Sign    Worried About Running Out of Food in the Last Year: Never true    Ran Out of Food in the Last Year: Never true  Transportation Needs: No Transportation Needs (04/01/2022)   PRAPARE - Administrator, Civil Service (Medical): No    Lack of Transportation (Non-Medical): No  Physical Activity: Not on file  Stress: Not on file  Social Connections: Not on file  Intimate Partner Violence: Not At Risk (04/01/2022)   Humiliation, Afraid, Rape, and Kick questionnaire    Fear of Current or Ex-Partner: No    Emotionally Abused: No    Physically Abused: No    Sexually Abused: No    FAMILY HISTORY: Family History  Problem Relation Age of Onset   Arthritis Mother    Anemia Mother    Diabetes Father    Hypertension Brother    Asthma Daughter    Heart disease Paternal Grandmother    Cancer Paternal Grandfather     ALLERGIES:  He has No Known Allergies.  MEDICATIONS:  Current Outpatient Medications  Medication Sig Dispense  Refill   celecoxib (CELEBREX) 200 MG capsule Take 200 mg by mouth daily as needed for moderate pain (pain score 4-6).     PARoxetine (PAXIL CR) 12.5 MG 24 hr tablet Take 1 tablet (12.5 mg total) by mouth daily. 30 tablet 0   Vitamin D, Ergocalciferol, (DRISDOL) 1.25 MG (50000 UNIT) CAPS capsule Take 1 capsule (50,000 Units total) by mouth every 7 (seven) days. Take for 8 total doses(weeks) 8 capsule 0   [START ON 09/26/2023] dexamethasone (DECADRON) 4 MG tablet Take 2 tablets (8 mg) by mouth daily x 3 days starting the day after cisplatin chemotherapy. Take with food. 30 tablet 1   [START ON 09/26/2023] lidocaine-prilocaine (EMLA) cream Apply to affected area once 30 g 3   [START ON 09/26/2023] ondansetron (ZOFRAN) 8 MG tablet Take 1 tablet (8 mg total) by mouth every 8 (eight) hours as needed for nausea or vomiting. Start on the third day after cisplatin. 30 tablet 1   [START  ON 09/26/2023] prochlorperazine (COMPAZINE) 10 MG tablet Take 1 tablet (10 mg total) by mouth every 6 (six) hours as needed (Nausea or vomiting). 30 tablet 1   No current facility-administered medications for this visit.    REVIEW OF SYSTEMS:    Review of Systems - Oncology  All other pertinent systems were reviewed with the patient and are negative.  PHYSICAL EXAMINATION:  ECOG PERFORMANCE STATUS: 1 - Symptomatic but completely ambulatory  Vitals:   09/15/23 0958  BP: 114/75  Pulse: 68  Resp: 20  Temp: 98 F (36.7 C)  SpO2: 100%   Filed Weights   09/15/23 0958  Weight: 180 lb 1.6 oz (81.7 kg)    Physical Exam Constitutional:      General: He is not in acute distress.    Appearance: Normal appearance.  HENT:     Head: Normocephalic and atraumatic.     Mouth/Throat:     Mouth: Mucous membranes are moist.     Pharynx: Oropharynx is clear.     Comments: Visualized portions of oral cavity appear normal Eyes:     General: No scleral icterus.    Extraocular Movements: Extraocular movements intact.     Conjunctiva/sclera: Conjunctivae normal.  Cardiovascular:     Rate and Rhythm: Normal rate and regular rhythm.     Pulses: Normal pulses.     Heart sounds: Normal heart sounds.  Pulmonary:     Effort: Pulmonary effort is normal.     Breath sounds: Normal breath sounds.  Abdominal:     General: There is no distension.     Palpations: Abdomen is soft.     Tenderness: There is no abdominal tenderness.  Musculoskeletal:     Right lower leg: No edema.     Left lower leg: No edema.  Lymphadenopathy:     Cervical: Cervical adenopathy (left sided ~ 3 cm level 2 LN) present.  Skin:    Findings: No rash.  Neurological:     General: No focal deficit present.     Mental Status: He is alert and oriented to person, place, and time.  Psychiatric:        Mood and Affect: Mood normal.        Behavior: Behavior normal.        Thought Content: Thought content normal.         Judgment: Judgment normal.      LABORATORY DATA:   I have reviewed the data as listed.  Recent Results (from the past 2160 hour(s))  TSH     Status: None   Collection Time: 09/15/23 11:11 AM  Result Value Ref Range   TSH 3.827 0.350 - 4.500 uIU/mL    Comment: Performed by a 3rd Generation assay with a functional sensitivity of <=0.01 uIU/mL. Performed at [facility], [address] [place], [place] [address]   Comprehensive metabolic panel     Status: Abnormal   Collection Time: 09/15/23 11:12 AM  Result Value Ref Range   Sodium 139 135 - 145 mmol/L   Potassium 4.2 3.5 - 5.1 mmol/L   Chloride 105 98 - 111 mmol/L   CO2 30 22 - 32 mmol/L   Glucose, Bld 90 70 - 99 mg/dL    Comment: Glucose reference range applies only to samples taken after fasting for at least 8 hours.   BUN 8 6 - 20 mg/dL   Creatinine, Ser 0.27 0.61 - 1.24 mg/dL   Calcium 9.6 8.9 - 25.3 mg/dL   Total Protein 7.4 6.5 - 8.1 g/dL   Albumin 4.4 3.5 - 5.0 g/dL   AST 20 15 - 41 U/L   ALT 19 0 - 44 U/L   Alkaline Phosphatase 59 38 - 126 U/L   Total Bilirubin 0.6 0.3 - 1.2 mg/dL   GFR, Estimated >66 >44 mL/min    Comment: (NOTE) Calculated using the CKD-EPI Creatinine Equation (2021)    Anion gap 4 (L) 5 - 15    Comment: Performed at [facility], [address] [place], [place] [address]  CBC with Differential/Platelet     Status: None   Collection Time: 09/15/23 11:12 AM  Result Value Ref Range   WBC 4.9 4.0 - 10.5 K/uL   RBC 4.88 4.22 - 5.81 MIL/uL   Hemoglobin 15.2 13.0 - 17.0 g/dL   HCT 25.9 56.3 - 87.5 %   MCV 87.7 80.0 - 100.0 fL   MCH 31.1 26.0 - 34.0 pg   MCHC 35.5 30.0 - 36.0 g/dL   RDW 64.3 32.9 - 51.8 %   Platelets 205 150 - 400 K/uL   nRBC 0.0 0.0 - 0.2 %   Neutrophils Relative % 42 %   Neutro Abs 2.1 1.7 - 7.7 K/uL   Lymphocytes Relative 42 %   Lymphs Abs 2.1 0.7 - 4.0 K/uL   Monocytes Relative 12 %   Monocytes Absolute 0.6 0.1 - 1.0 K/uL    Eosinophils Relative 3 %   Eosinophils Absolute 0.1 0.0 - 0.5 K/uL   Basophils Relative 1 %   Basophils Absolute 0.1 0.0 - 0.1 K/uL   Immature Granulocytes 0 %   Abs Immature Granulocytes 0.01 0.00 - 0.07 K/uL    Comment: Performed at [facility], [address], [place], [place] [address]     RADIOGRAPHIC STUDIES:  I have personally reviewed the radiological images from outside facility and agree with the findings in the report.   ASSESSMENT & PLAN:  42 y.o. gentleman with a past medical history of familial adenomatous polyposis status post total colectomy in 2016, vitamin D deficiency, was referred to our clinic for newly diagnosed left tonsillar squamous cell carcinoma, p16 positive, stage I.  Malignant neoplasm of tonsillar fossa (HCC) -Newly diagnosed left tonsillar squamous cell carcinoma with a 3 cm lymph node. cT2,cN1,cM0, p16+.  -Today I discussed diagnosis, staging, prognosis, plan of care.  Reviewed NCCN guidelines and discussed treatment options.  Discussed that the head and neck cancer treatments involves multidisciplinary approach -Discussed the benefits and  risks of radiation alone vs combination of chemotherapy and radiation. Given the patient's age and the size of the lymph node which is borderline at 3 cm, the benefits of adding chemotherapy slightly outweigh the risks. -Discussed side effect profile with cisplatin including, but not limited to, nausea, vomiting, electrolyte disturbances, renal dysfunction, tinnitus, hearing loss, neuropathy, cytopenias etc.  After discussing side effect profile, patient did opt for weekly cisplatin.  Plan is to proceed with weekly cisplatin at 40 mg/m dose during the course of radiation.  He will receive appropriate pre and post hydration and IV nausea medications. -Will arrange for Port-A-Cath placement, feeding tube placement and chemo education. -Prescriptions sent for nausea medications, Emla  cream. -Advised to stay hydrated, aiming for up to three liters of water or non-caffeinated drinks per day, especially during chemotherapy. -CBC, CMP, TSH are all unremarkable today.  Familial adenomatous polyposis -Underwent total colectomy in 2016. Currently experiencing watery bowel movements approximately seven times a day. -Plan to refer to a genetic counselor after completion of tonsillar cancer treatments.   The total time spent in the appointment was 60 minutes encounter with the patient, including review of chart and results of various tests, discussion about plan of care and coordination of care plan.  I reviewed lab results and outside records for this visit and discussed relevant results with the patient. Diagnosis, plan of care and treatment options were also discussed in detail with the patient. Opportunity provided to ask questions and answers provided to his apparent satisfaction. Provided instructions to call our clinic with any problems, questions or concerns prior to return visit. I recommended to continue follow-up with PCP and sub-specialists. He verbalized understanding and agreed with the plan. No barriers to learning was detected.  NCCN guidelines have been consulted in the planning of this patient's care.  [name], MD  09/15/2023 3:04 PM  [facility] AT [facility] [address] [place] [address] Dept: [phone] Dept Fax: [phone]   Orders Placed This Encounter  Procedures   CBC with Differential/Platelet    Standing Status:   Future    Number of Occurrences:   1    Standing Expiration Date:   09/14/2024   Comprehensive metabolic panel    Standing Status:   Future    Number of Occurrences:   1    Standing Expiration Date:   09/14/2024   TSH    Standing Status:   Future    Number of Occurrences:   1    Standing Expiration Date:   09/14/2024   CBC with Differential (Cancer Center Only)    Standing Status:    Future    Standing Expiration Date:   09/26/2024   Basic Metabolic Panel - Cancer Center Only    Standing Status:   Future    Standing Expiration Date:   09/26/2024   Magnesium    Standing Status:   Future    Standing Expiration Date:   09/26/2024    CODE STATUS:   Future Appointments  Date Time Provider Department Center  09/19/2023 10:15 AM [facility] NURSE [facility] None  09/19/2023 11:00 AM [name], MD [facility] None  09/21/2023  2:00 PM [facility] CHEMO EDU [facility] None  09/22/2023  9:30 AM [facility] 1 [facility] [place]  09/22/2023 10:00 AM [facility] 1 [facility] [place]  09/28/2023 10:45 AM [facility] FLUSH [facility] None  09/28/2023 11:20 AM Verlon Pischke, MD [facility] None  09/29/2023  7:30 AM [facility] INFUSION [facility] None  10/10/2023  1:00 PM Mollie Germany, LCSW [facility] None  10/24/2023 11:00 AM Mollie Germany, LCSW [facility] None  11/01/2023  2:00 PM Thresa Ross, MD [facility] None  11/07/2023  1:00 PM Mollie Germany, LCSW [facility] None  06/20/2024  8:40 AM Ashley Royalty [name], MD [facility]      This document was completed utilizing speech recognition software. Grammatical errors, random word insertions, pronoun errors, and incomplete sentences are an occasional consequence of this system due to software limitations, ambient noise, and hardware issues. Any formal questions or concerns about the content, text or information contained within the body of this dictation should be directly addressed to the provider for clarification.

## 2023-09-16 ENCOUNTER — Other Ambulatory Visit: Payer: Self-pay

## 2023-09-16 ENCOUNTER — Inpatient Hospital Stay: Payer: BC Managed Care – PPO

## 2023-09-16 DIAGNOSIS — C090 Malignant neoplasm of tonsillar fossa: Secondary | ICD-10-CM

## 2023-09-16 NOTE — Progress Notes (Signed)
[facility] Clinical Social Work  Clinical Social Work was referred by nurse for assessment of psychosocial needs.  Clinical Social Worker contacted caregiver by phone to offer support and assess for needs.  Patient's wife spoke with CSW about specific needs, patient reported needs for assistance with getting assistance with FMLA and Disability through SS. CSW provided contacted for Harry Williams for questions for FMLA and placed a referral for [facility] for Disability. CSW provided direct contact.   Marguerita Merles, LCSWA  Clinical Social Worker [facility]

## 2023-09-19 ENCOUNTER — Ambulatory Visit
Admission: RE | Admit: 2023-09-19 | Discharge: 2023-09-19 | Disposition: A | Discharge status: Home / Self Care | Payer: BC Managed Care – PPO | Source: Ambulatory Visit | Attending: Radiation Oncology

## 2023-09-19 ENCOUNTER — Ambulatory Visit: Payer: BC Managed Care – PPO

## 2023-09-19 ENCOUNTER — Ambulatory Visit
Admission: RE | Admit: 2023-09-19 | Discharge: 2023-09-19 | Disposition: A | Discharge status: Home / Self Care | Payer: BC Managed Care – PPO | Source: Ambulatory Visit | Attending: Radiation Oncology | Admitting: Radiation Oncology

## 2023-09-19 VITALS — BP 111/85 | HR 63 | Temp 97.6°F | Resp 18 | Ht 68.0 in | Wt 176.0 lb

## 2023-09-19 DIAGNOSIS — C090 Malignant neoplasm of tonsillar fossa: Secondary | ICD-10-CM | POA: Insufficient documentation

## 2023-09-19 DIAGNOSIS — Z510 Encounter for antineoplastic radiation therapy: Secondary | ICD-10-CM | POA: Insufficient documentation

## 2023-09-19 NOTE — Progress Notes (Signed)
Oncology Nurse Navigator Documentation   Met with       to provide PEG education prior to 09/22/23 placement.   Using  PEG teaching device   and Teach Back, provided education for PEG use and care, including: hand hygiene, gravity bolus administration of daily water flushes and nutritional supplement, fluids and medications; care of tube insertion site including daily dressing change and cleaning; S&S of infection.   Harry Williams correctly verbalized procedures for gravity administration of water, dressing change and site care.  I provided written instructions for PEG flushing/dressing change in support of verbal instruction.   I provided/described contents of Start of Care Bolus Feeding Kit (2 60 cc syringes, 1 box 4x4 drainage sponges, 1 package mesh briefs, 1 roll paper tape, 1 case Osmolite 1.5).  He voiced understanding he is to start using Osmolite per guidance of Nutrition. He understands I will be available for ongoing PEG support. Provided barium sulfate prep which I obtained from [facility] and reviewed instructions.     He also had his CT simulation/radiation planning today and tolerated procedure without difficulty, denied questions/concerns.   I encouraged him to call me prior to his 09/27/23 [facility].   Hedda Slade RN, BSN, OCN Head & Neck Oncology Nurse Navigator [facility] Phone # [phone]  Fax # [phone]

## 2023-09-19 NOTE — Progress Notes (Signed)
[facility] CSW Progress Note  Clinical Child psychotherapist submitted referral to the servant center.    Harry Williams, LCSWA Clinical Social Worker [facility]

## 2023-09-20 ENCOUNTER — Other Ambulatory Visit: Payer: BC Managed Care – PPO

## 2023-09-20 NOTE — Progress Notes (Signed)

## 2023-09-21 ENCOUNTER — Inpatient Hospital Stay: Payer: BC Managed Care – PPO

## 2023-09-21 ENCOUNTER — Other Ambulatory Visit: Payer: Self-pay | Admitting: Radiology

## 2023-09-21 DIAGNOSIS — C090 Malignant neoplasm of tonsillar fossa: Secondary | ICD-10-CM

## 2023-09-21 NOTE — Consult Note (Incomplete)
Chief Complaint: Patient was seen in consultation today for image guided Port-A-Cath and gastrostomy tube placements  Referring Physician(s): Pasam,[name]  Supervising Physician: Gilmer Mor  Patient Status: [facility] - Out-pt  History of Present Illness: Harry Williams is a 42 y.o. 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 He is scheduled today for image guided Port-A-Cath and gastrostomy tube placements while undergoing chemoradiation.  Past Medical History:  Diagnosis Date   Anxiety    Asthma    Avascular necrosis of hip, left (HCC) 07/24/2021   Bright red blood per rectum 12/16/2022   Closed fracture of nasal bone with routine healing 12/24/2016   Depression    PTSD (post-traumatic stress disorder)     Past Surgical History:  Procedure Laterality Date   APPENDECTOMY     COLON SURGERY  03/30/2015   JOINT REPLACEMENT  2008   partial right elbow; right femoral graft   Total abdominal proctocolectomy, J-pouch, ileal pouch anal anastomosis  2016    Allergies: Patient has no known allergies.  Medications: Prior to Admission medications   Medication Sig Start Date End Date Taking? Authorizing Provider  celecoxib (CELEBREX) 200 MG capsule Take 200 mg by mouth daily as needed for moderate pain (pain score 4-6).    [provider]  dexamethasone (DECADRON) 4 MG tablet Take 2 tablets (8 mg) by mouth daily x 3 days starting the day after cisplatin chemotherapy. Take with food. 09/26/23   Pasam, [name], MD  lidocaine-prilocaine (EMLA) cream Apply to affected area once 09/26/23   Pasam, [name], MD  ondansetron (ZOFRAN) 8 MG tablet Take 1 tablet (8 mg total) by mouth every 8 (eight) hours as needed for nausea or vomiting. Start on the third  day after cisplatin. 09/26/23   Pasam, [name], MD  PARoxetine (PAXIL CR) 12.5 MG 24 hr tablet Take 1 tablet (12.5 mg total) by mouth daily. 08/30/23   Thresa Ross, MD  prochlorperazine (COMPAZINE) 10 MG tablet Take 1 tablet (10 mg total) by mouth every 6 (six) hours as needed (Nausea or vomiting). 09/26/23   Pasam, [name], MD  Vitamin D, Ergocalciferol, (DRISDOL) 1.25 MG (50000 UNIT) CAPS capsule Take 1 capsule (50,000 Units total) by mouth every 7 (seven) days. Take for 8 total doses(weeks) 06/19/23   [name], MD     Family History  Problem Relation Age of Onset   Arthritis Mother    Anemia Mother    Diabetes Father    Hypertension Brother    Asthma Daughter    Heart disease Paternal Grandmother    Cancer Paternal Grandfather     Social History   Socioeconomic History   Marital status: Married    Spouse name: Teacher, English as a foreign language   Number of children: 3   Years of education: 12+   Highest education level: Some college, no degree  Occupational History   Not on file  Tobacco Use   Smoking status: Never   Smokeless tobacco: Never  Vaping Use   Vaping status: Never Used  Substance and Sexual Activity   Alcohol use: Yes    Comment: Occasional   Drug use: Never   Sexual activity: Yes    Partners: Female  Other Topics Concern   Not on file  Social History Narrative   ** Merged History Encounter **       Social  Determinants of Health   Financial Resource Strain: Not on file  Food Insecurity: No Food Insecurity (04/01/2022)   Hunger Vital Sign    Worried About Running Out of Food in the Last Year: Never true    Ran Out of Food in the Last Year: Never true  Transportation Needs: No Transportation Needs (04/01/2022)   PRAPARE - Administrator, Civil Service (Medical): No    Lack of Transportation (Non-Medical): No  Physical Activity: Not on file  Stress: Not on file  Social Connections: Not on file      Review of Systems  Vital Signs:   Code  Status:     Physical Exam  Imaging: No results found.  Labs:  CBC: Recent Labs    06/17/23 0936 09/15/23 1112  WBC 4.6 4.9  HGB 14.9 15.2  HCT 45.2 42.8  PLT 220 205    COAGS: No results for input(s): "INR", "APTT" in the last 8760 hours.  BMP: Recent Labs    06/17/23 0936 09/15/23 1112  NA 139 139  K 4.8 4.2  CL 101 105  CO2 27 30  GLUCOSE 87 90  BUN 9 8  CALCIUM 9.9 9.6  CREATININE 1.13 1.05  GFRNONAA  --  >60    LIVER FUNCTION TESTS: Recent Labs    06/17/23 0936 09/15/23 1112  BILITOT 1.0 0.6  AST 22 20  ALT 18 19  ALKPHOS 72 59  PROT 7.0 7.4  ALBUMIN 4.4 4.4    TUMOR MARKERS: No results for input(s): "AFPTM", "CEA", "CA199", "CHROMGRNA" in the last 8760 hours.  Assessment and Plan: 42 y.o. 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 He is scheduled today for image guided Port-A-Cath and gastrostomy tube placements while undergoing chemoradiation.Risks and benefits of image guided port-a-catheter/ gastrostomy tube placements was discussed with the patient including, but not limited to bleeding, infection, pneumothorax, injury to adjacent structures or fibrin sheath development and need for additional procedures.  All of the patient's questions were answered, patient is agreeable to proceed. Consent signed and in chart.   Thank you for this interesting consult.  I greatly enjoyed meeting Harry Williams and look forward to participating in their care.  A copy of this report was sent to the requesting provider on this date.  Electronically Signed: D. Jeananne Rama, PA-C 09/21/2023, 1:26 PM   I spent a total of  25 minute   in face to face in clinical consultation, greater than 50% of which was counseling/coordinating  care for image guided Port-A-Cath and gastrostomy tube placements

## 2023-09-22 ENCOUNTER — Encounter (HOSPITAL_COMMUNITY): Payer: Self-pay

## 2023-09-22 ENCOUNTER — Ambulatory Visit (HOSPITAL_COMMUNITY)
Admission: RE | Admit: 2023-09-22 | Discharge: 2023-09-22 | Disposition: A | Discharge status: Home / Self Care | Payer: BC Managed Care – PPO | Source: Ambulatory Visit | Attending: Oncology | Admitting: Oncology

## 2023-09-22 ENCOUNTER — Other Ambulatory Visit: Payer: Self-pay | Admitting: Oncology

## 2023-09-22 ENCOUNTER — Other Ambulatory Visit: Payer: Self-pay

## 2023-09-22 DIAGNOSIS — F32A Depression, unspecified: Secondary | ICD-10-CM | POA: Insufficient documentation

## 2023-09-22 DIAGNOSIS — J45909 Unspecified asthma, uncomplicated: Secondary | ICD-10-CM | POA: Diagnosis not present

## 2023-09-22 DIAGNOSIS — C090 Malignant neoplasm of tonsillar fossa: Secondary | ICD-10-CM

## 2023-09-22 DIAGNOSIS — F419 Anxiety disorder, unspecified: Secondary | ICD-10-CM | POA: Diagnosis not present

## 2023-09-22 DIAGNOSIS — F4310 Post-traumatic stress disorder, unspecified: Secondary | ICD-10-CM | POA: Insufficient documentation

## 2023-09-22 HISTORY — PX: IR GASTROSTOMY TUBE MOD SED: IMG625

## 2023-09-22 HISTORY — PX: IR NASO G TUBE PLC W/FL W/RAD: IMG2321

## 2023-09-22 HISTORY — PX: IR IMAGING GUIDED PORT INSERTION: IMG5740

## 2023-09-22 LAB — CBC WITH DIFFERENTIAL/PLATELET
Abs Immature Granulocytes: 0.01 10*3/uL (ref 0.00–0.07)
Basophils Absolute: 0 10*3/uL (ref 0.0–0.1)
Basophils Relative: 1 %
Eosinophils Absolute: 0.1 10*3/uL (ref 0.0–0.5)
Eosinophils Relative: 2 %
HCT: 45.5 % (ref 39.0–52.0)
Hemoglobin: 15.5 g/dL (ref 13.0–17.0)
Immature Granulocytes: 0 %
Lymphocytes Relative: 39 %
Lymphs Abs: 1.7 10*3/uL (ref 0.7–4.0)
MCH: 30.9 pg (ref 26.0–34.0)
MCHC: 34.1 g/dL (ref 30.0–36.0)
MCV: 90.6 fL (ref 80.0–100.0)
Monocytes Absolute: 0.5 10*3/uL (ref 0.1–1.0)
Monocytes Relative: 11 %
Neutro Abs: 2.1 10*3/uL (ref 1.7–7.7)
Neutrophils Relative %: 47 %
Platelets: 206 10*3/uL (ref 150–400)
RBC: 5.02 MIL/uL (ref 4.22–5.81)
RDW: 12.7 % (ref 11.5–15.5)
WBC: 4.3 10*3/uL (ref 4.0–10.5)
nRBC: 0 % (ref 0.0–0.2)

## 2023-09-22 LAB — BASIC METABOLIC PANEL
Anion gap: 6 (ref 5–15)
BUN: 8 mg/dL (ref 6–20)
CO2: 27 mmol/L (ref 22–32)
Calcium: 9.1 mg/dL (ref 8.9–10.3)
Chloride: 106 mmol/L (ref 98–111)
Creatinine, Ser: 1.04 mg/dL (ref 0.61–1.24)
GFR, Estimated: >60 mL/min (ref >60)
Glucose, Bld: 90 mg/dL (ref 70–99)
Potassium: 4.2 mmol/L (ref 3.5–5.1)
Sodium: 139 mmol/L (ref 135–145)

## 2023-09-22 LAB — PROTIME-INR
INR: 0.9 (ref 0.8–1.2)
Prothrombin Time: 12.2 s (ref 11.4–15.2)

## 2023-09-22 MED ORDER — FENTANYL CITRATE (PF) 100 MCG/2ML IJ SOLN
INTRAMUSCULAR | Status: AC | PRN
  Administered 2023-09-22: 50 ug via INTRAVENOUS

## 2023-09-22 MED ORDER — LIDOCAINE-EPINEPHRINE 1 %-1:100000 IJ SOLN
INTRAMUSCULAR | Status: AC
  Filled 2023-09-22: qty 1

## 2023-09-22 MED ORDER — GLUCAGON HCL RDNA (DIAGNOSTIC) 1 MG IJ SOLR
INTRAMUSCULAR | Status: AC
  Filled 2023-09-22: qty 1

## 2023-09-22 MED ORDER — GLUCAGON HCL (RDNA) 1 MG IJ SOLR
INTRAMUSCULAR | Status: AC | PRN
  Administered 2023-09-22: .5 mg via INTRAVENOUS

## 2023-09-22 MED ORDER — LIDOCAINE-EPINEPHRINE 1 %-1:100000 IJ SOLN
20.0000 mL | Freq: Once | INTRAMUSCULAR | Status: AC
  Administered 2023-09-22: 20 mL via INTRADERMAL

## 2023-09-22 MED ORDER — MIDAZOLAM HCL 2 MG/2ML IJ SOLN
INTRAMUSCULAR | Status: AC | PRN
  Administered 2023-09-22: 1 mg via INTRAVENOUS

## 2023-09-22 MED ORDER — FENTANYL CITRATE (PF) 100 MCG/2ML IJ SOLN
INTRAMUSCULAR | Status: AC
  Filled 2023-09-22: qty 4

## 2023-09-22 MED ORDER — HEPARIN SOD (PORK) LOCK FLUSH 100 UNIT/ML IV SOLN
500.0000 [IU] | Freq: Once | INTRAVENOUS | Status: AC
  Administered 2023-09-22: 500 [IU] via INTRAVENOUS

## 2023-09-22 MED ORDER — GLUCAGON HCL (RDNA) 1 MG IJ SOLR
INTRAMUSCULAR | Status: AC | PRN
Start: 2023-09-22 — End: 2023-09-22
  Administered 2023-09-22: .5 mg via INTRAVENOUS

## 2023-09-22 MED ORDER — IOHEXOL 300 MG/ML  SOLN
50.0000 mL | Freq: Once | INTRAMUSCULAR | Status: AC | PRN
  Administered 2023-09-22: 20 mL

## 2023-09-22 MED ORDER — LIDOCAINE VISCOUS HCL 2 % MT SOLN
15.0000 mL | Freq: Once | OROMUCOSAL | Status: AC
  Administered 2023-09-22: 5 mL via OROMUCOSAL

## 2023-09-22 MED ORDER — MIDAZOLAM HCL 2 MG/2ML IJ SOLN
INTRAMUSCULAR | Status: AC
Start: 2023-09-22 — End: ?
  Filled 2023-09-22: qty 6

## 2023-09-22 MED ORDER — HYDROCODONE-ACETAMINOPHEN 5-325 MG PO TABS: 1.0000 | ORAL_TABLET | ORAL | Status: DC | PRN

## 2023-09-22 MED ORDER — SODIUM CHLORIDE 0.9 % IV SOLN: INTRAVENOUS | Status: DC

## 2023-09-22 MED ORDER — MIDAZOLAM HCL 2 MG/2ML IJ SOLN
INTRAMUSCULAR | Status: AC | PRN
Start: 2023-09-22 — End: 2023-09-22
  Administered 2023-09-22: 1 mg via INTRAVENOUS

## 2023-09-22 MED ORDER — CEFAZOLIN SODIUM-DEXTROSE 2-4 GM/100ML-% IV SOLN: 2.0000 g | INTRAVENOUS | Status: DC

## 2023-09-22 MED ORDER — ONDANSETRON HCL 4 MG/2ML IJ SOLN: 4.0000 mg | INTRAMUSCULAR | Status: DC | PRN

## 2023-09-22 MED ORDER — CEFAZOLIN SODIUM-DEXTROSE 2-4 GM/100ML-% IV SOLN
INTRAVENOUS | Status: AC
  Filled 2023-09-22: qty 100

## 2023-09-22 MED ORDER — HEPARIN SOD (PORK) LOCK FLUSH 100 UNIT/ML IV SOLN
INTRAVENOUS | Status: AC
  Filled 2023-09-22: qty 5

## 2023-09-22 MED ORDER — LIDOCAINE VISCOUS HCL 2 % MT SOLN
OROMUCOSAL | Status: AC
  Filled 2023-09-22: qty 15

## 2023-09-22 MED ORDER — HYDROCODONE-ACETAMINOPHEN 5-325 MG PO TABS: 1.0000 | ORAL_TABLET | Freq: Four times a day (QID) | ORAL | 0 refills | Status: DC | PRN

## 2023-09-22 MED ORDER — HYDROMORPHONE HCL 1 MG/ML IJ SOLN: 1.0000 mg | INTRAMUSCULAR | Status: DC | PRN

## 2023-09-22 NOTE — Progress Notes (Signed)
1400 Paged Dr. Milford Cage re: pain level 10 unable after having no pain. Unable to get  OOB to go home. [id number] Patient got OOB standing and "stated he was not having pain." Asked him if he felt like he could take the ride home now since he is without pain, he answered yes. 1405 Discharged home  to car by wheelchair.

## 2023-09-22 NOTE — Progress Notes (Signed)
1405 Discharged home without pain, level 0.

## 2023-09-22 NOTE — Sedation Documentation (Signed)
Port a Cath placement procedure complete. Patient being prepped for G Tube procedure.

## 2023-09-22 NOTE — Discharge Instructions (Addendum)
Discharge Instructions:   Please call Interventional Radiology clinic [phone] with any questions or concerns.  You may remove your bandaid from your neck and dressing from right chest and do a sponge bath tomorrow.  Keep the dressing on your stomach dry for the next week, change the dressing daily.  Do not use EMLA / Lidocaine cream for 2 weeks post Port Insertion this will remove the surgical glue.  Implanted Port Insertion, Care After The following information offers guidance on how to care for yourself after your procedure. Your health care provider may also give you more specific instructions. If you have problems or questions, contact your health care provider. What can I expect after the procedure? After the procedure, it is common to have: Discomfort at the port insertion site. Bruising on the skin over the port. This should improve over 3-4 days. Follow these instructions at home: [facility] care After your port is placed, you will get a manufacturer's information card. The card has information about your port. Keep this card with you at all times. Take care of the port as told by your health care provider. Ask your health care provider if you or a family member can get training for taking care of the port at home. A home health care nurse will be be available to help care for the port. Make sure to remember what type of port you have. Incision care     Follow instructions from your health care provider about how to take care of your port insertion site. Make sure you: Wash your hands with soap and water for at least 20 seconds before and after you change your bandage (dressing). If soap and water are not available, use hand sanitizer. Change your dressing as told by your health care provider. Leave stitches (sutures), skin glue, or adhesive strips in place. These skin closures may need to stay in place for 2 weeks or longer. If adhesive strip edges start to loosen and curl up, you  may trim the loose edges. Do not remove adhesive strips completely unless your health care provider tells you to do that. Check your port insertion site every day for signs of infection. Check for: Redness, swelling, or pain. Fluid or blood. Warmth. Pus or a bad smell. Activity Return to your normal activities as told by your health care provider. Ask your health care provider what activities are safe for you. You may have to avoid lifting. Ask your health care provider how much you can safely lift. General instructions Take over-the-counter and prescription medicines only as told by your health care provider. Do not take baths, swim, or use a hot tub until your health care provider approves. Ask your health care provider if you may take showers. You may only be allowed to take sponge baths. If you were given a sedative during the procedure, it can affect you for several hours. Do not drive or operate machinery until your health care provider says that it is safe. Wear a medical alert bracelet in case of an emergency. This will tell any health care providers that you have a port. Keep all follow-up visits. This is important. Contact a health care provider if: You cannot flush your port with saline as directed, or you cannot draw blood from the port. You have a fever or chills. You have redness, swelling, or pain around your port insertion site. You have fluid or blood coming from your port insertion site. Your port insertion site feels warm to the  touch. You have pus or a bad smell coming from the port insertion site. Get help right away if: You have chest pain or shortness of breath. You have bleeding from your port that you cannot control. These symptoms may be an emergency. Get help right away. Call 911. Do not wait to see if the symptoms will go away. Do not drive yourself to the hospital. Summary Take care of the port as told by your health care provider. Keep the manufacturer's  information card with you at all times. Change your dressing as told by your health care provider. Contact a health care provider if you have a fever or chills or if you have redness, swelling, or pain around your port insertion site. Keep all follow-up visits. This information is not intended to replace advice given to you by your health care provider. Make sure you discuss any questions you have with your health care provider. Document Revised: 05/19/2021 Document Reviewed: 05/19/2021 Elsevier Patient Education  2023 Elsevier Inc.   G-tube to gravity drainage bag x 24 hrs Liquid diet x 24 hrs OK to cap G-tube (during 24 hr period listed above) for 2 hours post liquid meal. OK for meds per tube immediately post procedure. OK to begin TFs via G-tube use in 4 hrs. Taper from trickle to goal as tolerated.   Pt is to return to [facility] for routine G-tube exchange in 6 months.     Gastrostomy Tube Home Guide, Adult A gastrostomy tube, or G-tube, is a tube that is inserted through the abdomen into the stomach. The tube is used to give feedings and medicines when a person cannot eat and drink enough on his or her own or take medicines by mouth. How to care for the insertion site Washing hands with soap and water. Supplies needed: Saline solution or clean, warm water and soap. Saline solution is made of salt and water. Cotton swab or gauze. Pre-cut gauze bandage (dressing) and tape, if needed. Instructions Follow these steps daily to clean the insertion site: Wash your hands with soap and water for at least 20 seconds. Remove the dressing (if there is one) that is between the person's skin and the tube. Check the area where the tube enters the skin. Check daily for problems such as: Redness, rash, or irritation. Swelling. Pus-like drainage. Extra skin growth. Moisten the cotton swab or gauze with the saline solution or with a soap-and-water mixture. Gently clean around the insertion site.  Remove any drainage or crusted material. When the G-tube is first put in, a normal saline solution or water can be used to clean the skin. After the skin around the tube has healed, mild soap and water may be used. Apply a dressing (if there should be one) between the person's skin and the tube. How to flush a G-tube Flush the G-tube regularly to keep it from clogging. Flush it before and after feedings and as often as told by the health care provider. Supplies needed: Purified or germ-free (sterile) water, warmed. Container with lid for boiling water, if needed. 60 cc G-tube syringe. Instructions Before you begin, decide whether to use sterile water or purified drinking water. Use only sterile water if: The person has a weak disease-fighting (immune) system. The person has trouble fighting off infections (is immunocompromised). You are unsure about the amount of chemical contaminants in purified or drinking water. Use purified drinking water in all other cases. To purify drinking water by boiling: Boil water for at least 1  minute. Keep lid over water while it boils. Let water cool to room temperature before using. Follow these steps to flush the G-tube: Wash your hands with soap and water for at least 20 seconds. Bring out (draw up) 30 mL of warm water in a syringe. Connect the syringe to the tube. Slowly and gently push the water into the tube. General tips If the tube comes out: Cover the opening with a clean dressing and tape. Get help right away. If there is skin or scar tissue growing where the tube enters the skin: Keep the area clean and dry. Secure the tube with tape so that the tube does not move around too much. If the tube gets clogged: Slowly push warm water into the tube with a large syringe. Do not force the fluid into the tube or push an object into the tube. Get help right away if you cannot unclog the tube. Follow these instructions at home: Feedings Give  feedings at room temperature. If feedings are continuous: Do not put more than 4 hours' worth of feedings in the feeding bag. Stop the feedings when you need to give medicine or flush the tube. Be sure to restart the feedings. Make sure the person's head is above his or her stomach (upright position). This will prevent choking and discomfort. Make sure the person is in the right position during and after feedings. During feedings, have the person in the upright position. After a non-continuous feeding (bolus feeding), have the person stay in the upright position for 1 hour. Cover and place unused feedings in the refrigerator. Replace feeding bags and syringes as told. Good hygiene Make sure the person takes good care of his or her mouth and teeth (oral hygiene), such as by brushing his or her teeth. Keep the area where the tube enters the skin clean and dry. General instructions Do not pull or put tension on the tube. Before you remove the tube cap or disconnect a syringe, close the tube by using a clamp (clamping) or bending (kinking) the tube. Measure the length of the G-tube every day from the insertion site to the end of the tube. If the person's G-tube has a balloon, check the fluid in the balloon every week. Check the manufacturer's specifications to find the amount of fluid that should be in the balloon. Remove excess air from the G-tube as told. This is called venting. Do not push feedings, medicines, or flushes fast. Use the feeding tube equipment, such as syringes and connectors, only as told by your health care provider. Contact a health care provider if: The person with the tube has constipation or a fever. A large amount of fluid or mucus-like liquid is leaking from the tube. Skin or scar tissue appears to be growing where the tube enters the skin. The length of tube from the insertion site to the G-tube gets longer. Get help right away if: The person with the tube has any of  these problems: Severe pain, tenderness, or bloating in the abdomen. Nausea or vomiting. Trouble breathing or shortness of breath. Any of these problems happen in the area where the tube enters the skin: Redness, irritation, swelling, or soreness. Pus-like discharge. A bad smell. The tube is clogged and cannot be flushed. The tube comes out. The tube will need to be put back in within 4 hours. Summary A gastrostomy tube, or G-tube, is a tube that is inserted through the abdomen into the stomach. The tube is used to give  feedings and medicines when a person cannot eat and drink enough on his or her own or cannot take medicine by mouth. Check and clean the insertion site daily as told by the person's health care provider. Flush the G-tube regularly to keep it from clogging. Flush it before and after feedings and as often as told. Keep the area where the tube enters the skin clean and dry. This information is not intended to replace advice given to you by your health care provider. Make sure you discuss any questions you have with your health care provider. Document Revised: 11/05/2020 Document Reviewed: 04/03/2020 Elsevier Patient Education  2023 Elsevier Inc.   Moderate Conscious Sedation, Adult, Care After This sheet gives you information about how to care for yourself after your procedure. Your health care provider may also give you more specific instructions. If you have problems or questions, contact your health care provider. What can I expect after the procedure? After the procedure, it is common to have: Sleepiness for several hours. Impaired judgment for several hours. Difficulty with balance. Vomiting if you eat too soon. Follow these instructions at home: For the time period you were told by your health care provider: Rest. Do not participate in activities where you could fall or become injured. Do not drive or use machinery. Do not drink alcohol. Do not take sleeping  pills or medicines that cause drowsiness. Do not make important decisions or sign legal documents. Do not take care of children on your own. Eating and drinking  Follow the diet recommended by your health care provider. Drink enough fluid to keep your urine pale yellow. If you vomit: Drink water, juice, or soup when you can drink without vomiting. Make sure you have little or no nausea before eating solid foods. General instructions Take over-the-counter and prescription medicines only as told by your health care provider. Have a responsible adult stay with you for the time you are told. It is important to have someone help care for you until you are awake and alert. Do not smoke. Keep all follow-up visits as told by your health care provider. This is important. Contact a health care provider if: You are still sleepy or having trouble with balance after 24 hours. You feel light-headed. You keep feeling nauseous or you keep vomiting. You develop a rash. You have a fever. You have redness or swelling around the IV site. Get help right away if: You have trouble breathing. You have new-onset confusion at home. Summary After the procedure, it is common to feel sleepy, have impaired judgment, or feel nauseous if you eat too soon. Rest after you get home. Know the things you should not do after the procedure. Follow the diet recommended by your health care provider and drink enough fluid to keep your urine pale yellow. Get help right away if you have trouble breathing or new-onset confusion at home. This information is not intended to replace advice given to you by your health care provider. Make sure you discuss any questions you have with your health care provider. Document Revised: 03/14/2020 Document Reviewed: 10/11/2019 Elsevier Patient Education  2023 [employer].

## 2023-09-22 NOTE — Progress Notes (Signed)
1420 Spoke with Dr. Milford Cage gave update on Harry Williams's pain status of level10 at 1400 with a decrease to level 0 at 1403 after repositioning. He  went home pain free at 1405.

## 2023-09-22 NOTE — Progress Notes (Signed)
Ice bags given to use for comfort for pain as needed.

## 2023-09-22 NOTE — Progress Notes (Signed)
1100 Kelvin Allred, P.A. asked to look at gastrostomy dressing.  Asked to change the original dressing now done. No new bleeding noticed.

## 2023-09-22 NOTE — Sedation Documentation (Signed)
Gastrostomy tube placement procedure begun.

## 2023-09-22 NOTE — Progress Notes (Signed)
Patient ID: Harry Williams, male   DOB: [birth year], 42 y.o.   MRN: [id number] Per order of Dr. Milford Cage, electronic prescription sent to pt's pharmacy for Norco 5/325 , #15, no refills, 1-2 tablets every 6 hours as needed for moderate pain s/p gastrostomy tube placement earlier today.

## 2023-09-22 NOTE — Procedures (Signed)
Vascular and Interventional Radiology Procedure Note  Patient: Harry Williams DOB: 05/13/1981 Medical Record Number: [id number] Note Date/Time: 09/22/23 9:28 AM   Performing Physician: Roanna Banning, MD Assistant(s): None  Diagnosis: Head and Neck cancer  Procedure:  PORT PLACEMENT PERCUTANEOUS GASTROSTOMY TUBE PLACEMENT  Anesthesia: Conscious Sedation Complications: None Estimated Blood Loss: Minimal  Findings:  Successful right-sided port placement, with the tip of the catheter in the proximal right atrium.  Successful placement of a  32 F gastrostomy tube under fluoroscopy.  Plan: Port catheter ready for use.  G-tube to gravity drainage bag x 24 hrs Liquid diet x 24 hrs OK to cap G-tube (during 24 hr period listed above) for 2 hours post liquid meal. OK for meds per tube immediately post procedure. OK to begin TFs via G-tube use in 4 hrs. Taper from trickle to goal as tolerated.   Pt is to return to [facility] for routine G-tube exchange in 6 months.  See detailed procedure note with images in PACS. The patient tolerated the procedure well without incident or complication and was returned to [facility] in stable condition.    Roanna Banning, MD [facility] Radiology   Pager. [phone] Clinic. [phone]

## 2023-09-27 ENCOUNTER — Encounter: Payer: Self-pay | Admitting: Oncology

## 2023-09-27 ENCOUNTER — Other Ambulatory Visit: Payer: Self-pay | Admitting: Oncology

## 2023-09-27 ENCOUNTER — Telehealth: Payer: Self-pay | Admitting: Oncology

## 2023-09-27 ENCOUNTER — Ambulatory Visit
Admission: RE | Admit: 2023-09-27 | Discharge: 2023-09-27 | Disposition: A | Discharge status: Home / Self Care | Payer: BC Managed Care – PPO | Source: Ambulatory Visit | Attending: Radiation Oncology | Admitting: Radiation Oncology

## 2023-09-27 ENCOUNTER — Other Ambulatory Visit: Payer: Self-pay

## 2023-09-27 DIAGNOSIS — C090 Malignant neoplasm of tonsillar fossa: Secondary | ICD-10-CM

## 2023-09-27 LAB — RAD ONC ARIA SESSION SUMMARY
Course Elapsed Days: 0
Plan Fractions Treated to Date: 1
Plan Prescribed Dose Per Fraction: 2 Gy
Plan Total Fractions Prescribed: 35
Plan Total Prescribed Dose: 70 Gy
Reference Point Dosage Given to Date: 2 Gy
Reference Point Session Dosage Given: 2 Gy
Session Number: 1

## 2023-09-27 NOTE — Progress Notes (Signed)
Oncology Nurse Navigator Documentation   To provide support, encouragement and care continuity, met with Harry Williams for his initial RT.  He was accompanied by his wife. I reviewed the 2-step treatment process, answered questions.  [name] completed treatment without difficulty, denied questions/concerns. I reviewed the registration/arrival procedure for subsequent treatments. I encouraged them to call me with questions/concerns as treatments proceed.   Hedda Slade RN, BSN, OCN Head & Neck Oncology Nurse Navigator [facility] Phone # [phone]  Fax # [phone]

## 2023-09-27 NOTE — Telephone Encounter (Signed)
Rescheduled appointment per provider PAL. Patient is aware of the changes made to his upcoming appointments.

## 2023-09-28 ENCOUNTER — Ambulatory Visit
Admission: RE | Admit: 2023-09-28 | Discharge: 2023-09-28 | Disposition: A | Discharge status: Home / Self Care | Payer: BC Managed Care – PPO | Source: Ambulatory Visit | Attending: Radiation Oncology

## 2023-09-28 ENCOUNTER — Inpatient Hospital Stay: Payer: BC Managed Care – PPO | Admitting: Oncology

## 2023-09-28 ENCOUNTER — Inpatient Hospital Stay: Payer: BC Managed Care – PPO

## 2023-09-28 ENCOUNTER — Other Ambulatory Visit: Payer: Self-pay

## 2023-09-28 DIAGNOSIS — C090 Malignant neoplasm of tonsillar fossa: Secondary | ICD-10-CM

## 2023-09-28 LAB — BASIC METABOLIC PANEL - CANCER CENTER ONLY
Anion gap: 3 — ABNORMAL LOW (ref 5–15)
BUN: 10 mg/dL (ref 6–20)
CO2: 29 mmol/L (ref 22–32)
Calcium: 9.6 mg/dL (ref 8.9–10.3)
Chloride: 106 mmol/L (ref 98–111)
Creatinine: 0.96 mg/dL (ref 0.61–1.24)
GFR, Estimated: >60 mL/min (ref >60)
Glucose, Bld: 101 mg/dL — ABNORMAL HIGH (ref 70–99)
Potassium: 4.1 mmol/L (ref 3.5–5.1)
Sodium: 138 mmol/L (ref 135–145)

## 2023-09-28 LAB — RAD ONC ARIA SESSION SUMMARY
Course Elapsed Days: 1
Plan Fractions Treated to Date: 2
Plan Prescribed Dose Per Fraction: 2 Gy
Plan Total Fractions Prescribed: 35
Plan Total Prescribed Dose: 70 Gy
Reference Point Dosage Given to Date: 4 Gy
Reference Point Session Dosage Given: 2 Gy
Session Number: 2

## 2023-09-28 LAB — CBC WITH DIFFERENTIAL (CANCER CENTER ONLY)
Abs Immature Granulocytes: 0.01 10*3/uL (ref 0.00–0.07)
Basophils Absolute: 0 10*3/uL (ref 0.0–0.1)
Basophils Relative: 1 %
Eosinophils Absolute: 0.1 10*3/uL (ref 0.0–0.5)
Eosinophils Relative: 2 %
HCT: 42.7 % (ref 39.0–52.0)
Hemoglobin: 14.8 g/dL (ref 13.0–17.0)
Immature Granulocytes: 0 %
Lymphocytes Relative: 32 %
Lymphs Abs: 1.2 10*3/uL (ref 0.7–4.0)
MCH: 30.4 pg (ref 26.0–34.0)
MCHC: 34.7 g/dL (ref 30.0–36.0)
MCV: 87.7 fL (ref 80.0–100.0)
Monocytes Absolute: 0.5 10*3/uL (ref 0.1–1.0)
Monocytes Relative: 13 %
Neutro Abs: 2.1 10*3/uL (ref 1.7–7.7)
Neutrophils Relative %: 52 %
Platelet Count: 197 10*3/uL (ref 150–400)
RBC: 4.87 MIL/uL (ref 4.22–5.81)
RDW: 12.2 % (ref 11.5–15.5)
WBC Count: 3.9 10*3/uL — ABNORMAL LOW (ref 4.0–10.5)
nRBC: 0 % (ref 0.0–0.2)

## 2023-09-28 LAB — MAGNESIUM: Magnesium: 1.9 mg/dL (ref 1.7–2.4)

## 2023-09-28 MED ORDER — HEPARIN SOD (PORK) LOCK FLUSH 100 UNIT/ML IV SOLN
500.0000 [IU] | Freq: Once | INTRAVENOUS | Status: AC
  Administered 2023-09-28: 500 [IU] via INTRAVENOUS

## 2023-09-28 MED ORDER — SODIUM CHLORIDE 0.9% FLUSH
10.0000 mL | Freq: Once | INTRAVENOUS | Status: AC
Start: 2023-09-28 — End: 2023-09-28
  Administered 2023-09-28: 10 mL via INTRAVENOUS

## 2023-09-28 MED FILL — Fosaprepitant Dimeglumine For IV Infusion 150 MG (Base Eq): INTRAVENOUS | Qty: 5 | Status: AC

## 2023-09-29 ENCOUNTER — Ambulatory Visit
Admission: RE | Admit: 2023-09-29 | Discharge: 2023-09-29 | Disposition: A | Discharge status: Home / Self Care | Payer: BC Managed Care – PPO | Source: Ambulatory Visit | Attending: Radiation Oncology | Admitting: Radiation Oncology

## 2023-09-29 ENCOUNTER — Other Ambulatory Visit: Payer: Self-pay

## 2023-09-29 ENCOUNTER — Encounter: Payer: Self-pay | Admitting: Oncology

## 2023-09-29 ENCOUNTER — Telehealth: Payer: Self-pay | Admitting: Dietician

## 2023-09-29 ENCOUNTER — Inpatient Hospital Stay: Payer: BC Managed Care – PPO

## 2023-09-29 ENCOUNTER — Inpatient Hospital Stay (HOSPITAL_BASED_OUTPATIENT_CLINIC_OR_DEPARTMENT_OTHER): Payer: BC Managed Care – PPO | Admitting: Oncology

## 2023-09-29 VITALS — BP 113/82 | HR 62 | Temp 98.2°F | Resp 18

## 2023-09-29 DIAGNOSIS — C090 Malignant neoplasm of tonsillar fossa: Secondary | ICD-10-CM | POA: Diagnosis not present

## 2023-09-29 DIAGNOSIS — D1391 Familial adenomatous polyposis: Secondary | ICD-10-CM | POA: Diagnosis not present

## 2023-09-29 DIAGNOSIS — K649 Unspecified hemorrhoids: Secondary | ICD-10-CM

## 2023-09-29 DIAGNOSIS — K648 Other hemorrhoids: Secondary | ICD-10-CM | POA: Insufficient documentation

## 2023-09-29 LAB — RAD ONC ARIA SESSION SUMMARY
Course Elapsed Days: 2
Plan Fractions Treated to Date: 3
Plan Prescribed Dose Per Fraction: 2 Gy
Plan Total Fractions Prescribed: 35
Plan Total Prescribed Dose: 70 Gy
Reference Point Dosage Given to Date: 6 Gy
Reference Point Session Dosage Given: 2 Gy
Session Number: 3

## 2023-09-29 MED ORDER — POTASSIUM CHLORIDE IN NACL 20-0.9 MEQ/L-% IV SOLN
Freq: Once | INTRAVENOUS | Status: AC
  Filled 2023-09-29: qty 1000

## 2023-09-29 MED ORDER — MAGNESIUM SULFATE 2 GM/50ML IV SOLN
2.0000 g | Freq: Once | INTRAVENOUS | Status: AC
Start: 2023-09-29 — End: 2023-09-29
  Administered 2023-09-29: 2 g via INTRAVENOUS
  Filled 2023-09-29: qty 50

## 2023-09-29 MED ORDER — SODIUM CHLORIDE 0.9 % IV SOLN
40.0000 mg/m2 | Freq: Once | INTRAVENOUS | Status: AC
  Administered 2023-09-29: 79 mg via INTRAVENOUS
  Filled 2023-09-29: qty 79

## 2023-09-29 MED ORDER — PALONOSETRON HCL INJECTION 0.25 MG/5ML
0.2500 mg | Freq: Once | INTRAVENOUS | Status: AC
  Administered 2023-09-29: 0.25 mg via INTRAVENOUS
  Filled 2023-09-29: qty 5

## 2023-09-29 MED ORDER — DEXAMETHASONE SODIUM PHOSPHATE 10 MG/ML IJ SOLN
10.0000 mg | Freq: Once | INTRAMUSCULAR | Status: AC
  Administered 2023-09-29: 10 mg via INTRAVENOUS
  Filled 2023-09-29: qty 1

## 2023-09-29 MED ORDER — SODIUM CHLORIDE 0.9 % IV SOLN: INTRAVENOUS | Status: DC

## 2023-09-29 MED ORDER — SODIUM CHLORIDE 0.9 % IV SOLN
150.0000 mg | Freq: Once | INTRAVENOUS | Status: AC
  Administered 2023-09-29: 150 mg via INTRAVENOUS
  Filled 2023-09-29: qty 150

## 2023-09-29 NOTE — Progress Notes (Signed)
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  -Previously I discussed diagnosis, staging, prognosis, plan of care.  Reviewed NCCN guidelines and discussed treatment options.  Discussed that the head and neck cancer treatments involves multidisciplinary approach -Discussed the benefits and risks of radiation alone vs combination of chemotherapy and radiation. Given the patient's age and the size of the lymph node which is borderline at 3 cm, the benefits of adding chemotherapy slightly outweigh the risks. -Discussed side effect profile with cisplatin including, but not limited to, nausea, vomiting, electrolyte disturbances, renal dysfunction, tinnitus, hearing loss, neuropathy, cytopenias etc.  After discussing side effect profile, patient did opt for weekly cisplatin.  Plan is to proceed with weekly cisplatin at 40 mg/m dose during the course of radiation.  He will receive appropriate pre and post hydration and IV nausea medications. -He completed Port-A-Cath placement, feeding tube placement and chemo education. -He started radiation treatments from 09/27/2023.   -Labs reviewed from yesterday.  No dose-limiting toxicities.  Will proceed with first dose of cisplatin today. -Advised to stay hydrated, aiming for up to three liters of water or non-caffeinated drinks per day -Plan to see patient next Wednesday before chemotherapy.  Bleeding hemorrhoid Likely hemorrhoidal  bleeding, bright red, no associated pain or constipation. Patient has been using over-the-counter hemorrhoid treatments intermittently with some relief. -Continue Preparation H and Tux pads consistently. -Consider sitz baths with warm water and chlorhexidine. -Increase dietary fiber.  Familial adenomatous polyposis -Underwent total colectomy in 2016. Currently experiencing watery bowel movements approximately seven times a day. -Plan to refer to a genetic counselor after completion of tonsillar cancer treatments.    I reviewed lab results and outside records for this visit and discussed relevant results with the patient. Diagnosis, plan of care and treatment options were also discussed in detail with the patient. Opportunity provided to ask questions and answers provided to his apparent satisfaction. Provided instructions to call our clinic with any problems, questions or concerns prior to return visit. I recommended to continue follow-up with PCP and sub-specialists. He verbalized understanding and agreed with the plan.   NCCN guidelines have been consulted in the planning of this patient's care.  I provided 30 minutes of face-to-face time during this encounter and > 50% was spent counseling as documented under my assessment and plan.    [name], MD  09/29/2023 1:21 PM  [facility] AT [facility] [address] [place] [address] Dept: [phone] Dept Fax: [phone]    CHIEF COMPLAINT/ REASON FOR VISIT:   Recently diagnosed squamous of carcinoma of left tonsillar fossa, p16 positive.  Current Treatment: Concurrent chemoradiation with weekly cisplatin, chemo started from 09/29/2023  INTERVAL HISTORY:    Discussed the use of AI scribe software for clinical note transcription with the patient, who gave verbal consent to proceed.   Lor Monahan is here today for repeat clinical assessment. He denies fevers or chills. He denies pain. His  appetite is good. His weight has been stable.  He reports ongoing  rectal bleeding, initially evaluated several months ago by his PCP. The bleeding is bright red, painless, and occurs with defecation. He has been using over the counter hemorrhoid treatments, including Preparation H and Tux pads, with some relief. However, the bleeding recurs when he stops using these treatments. He denies constipation and abdominal pain. He has not tried sitz baths or dietary modifications.  In addition, the patient recently started radiation therapy. He denies nausea, vomiting, and dysphagia. He is eating well and maintaining hydration. His blood work from the previous day was unremarkable.  I have reviewed the past medical history, past surgical history, social history and family history with the patient and they are unchanged from previous note.  ALLERGIES: He has No Known Allergies.  MEDICATIONS:  Current Outpatient Medications  Medication Sig Dispense Refill   celecoxib (CELEBREX) 200 MG capsule Take 200 mg by mouth daily as needed for moderate pain (pain score 4-6).     dexamethasone (DECADRON) 4 MG tablet Take 2 tablets (8 mg) by mouth daily x 3 days starting the day after cisplatin chemotherapy. Take with food. 30 tablet 1   HYDROcodone-acetaminophen (NORCO) 5-325 MG tablet Take 1-2 tablets by mouth every 6 (six) hours as needed for moderate pain (pain score 4-6). 15 tablet 0   lidocaine-prilocaine (EMLA) cream Apply to affected area once 30 g 3   ondansetron (ZOFRAN) 8 MG tablet Take 1 tablet (8 mg total) by mouth every 8 (eight) hours as needed for nausea or vomiting. Start on the third day after cisplatin. 30 tablet 1   PARoxetine (PAXIL CR) 12.5 MG 24 hr tablet Take 1 tablet (12.5 mg total) by mouth daily. 30 tablet 0   prochlorperazine (COMPAZINE) 10 MG tablet Take 1 tablet (10 mg total) by mouth every 6 (six) hours as needed (Nausea or vomiting). 30 tablet 1   Vitamin D, Ergocalciferol, (DRISDOL)  1.25 MG (50000 UNIT) CAPS capsule Take 1 capsule (50,000 Units total) by mouth every 7 (seven) days. Take for 8 total doses(weeks) 8 capsule 0   No current facility-administered medications for this visit.   Facility-Administered Medications Ordered in Other Visits  Medication Dose Route Frequency Provider Last Rate Last Admin   0.9 %  sodium chloride infusion   Intravenous Continuous Zuria Fosdick, MD 10 mL/hr at 09/29/23 0752 New Bag at 09/29/23 0752    HISTORY OF PRESENT ILLNESS:   Oncology History  Malignant neoplasm of tonsillar fossa (HCC)  07/20/2023 Imaging   CT neck soft tissue: 2.5 x 3 cm mass in the left tonsillar region with extension towards the tongue base, suspicious for carcinoma, and evidence of level 2 lymphadenopathy on the left, consisting of a dominant node measuring 3 x 2 x 1.7 cm. A benign appearing osteoma was also demonstrated, projecting inferior from the angle of the left mandible.    08/26/2023 Initial Diagnosis   Malignant neoplasm of tonsillar fossa [facility]):  He presented to his PCP for routine follow-up in July 2024 and was incidentally noted to have left submandibular swelling on a routine examination performed at that time. A soft tissue head and neck ultrasound was subsequently performed on 07/07/23 which showed an ovoid mass measuring 2.8 x 1.9 cm, most consistent with an abnormally enlarged lymph node, and correlating with the palpable area in the left submandibular region.    A soft tissue neck CT was also performed on 07/20/23 which demonstrated: a  2.5 x 3 cm mass in the left tonsillar region with extension towards the tongue base,  suspicious for carcinoma, and evidence of level 2 lymphadenopathy on the left, consisting of a dominant node measuring 3 x 2 x 1.7 cm. A benign appearing osteoma was also demonstrated, projecting inferior from the angle of the left mandible.   Subsequently, the patient was referred to Dr. Roma Schanz at [facility] and underwent  biopsies of the left tonsil on 08/26/23. 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    08/26/2023 Pathology Results   Patient was referred to Dr. Roma Schanz at [facility] and underwent biopsies of the left tonsil on 08/26/23. 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    09/07/2023 PET scan   PET scan performed on 09/07/23 at [facility]: intense radiotracer uptake within the left palatine tonsillar mass; adjacent radiotracer uptake within the region of the right palatine tonsil; and hypermetabolic nodes in the left neck extending to the supraclavicular region concerning for nodal metastases. Focal uptake at the anorectal junction was also demonstrated, possibly reflecting muscular contraction, however local malignancy can not be excluded. PET otherwise showed no evidence of more distant metastatic disease.    09/14/2023 Cancer Staging   Staging form: Pharynx - HPV-Mediated Oropharynx, AJCC 8th Edition - Clinical stage from 09/14/2023: Stage I (cT2, cN1, cM0, p16+) - Signed by [name], MD on 09/14/2023 Stage prefix: Initial diagnosis   09/29/2023 -  Chemotherapy   Patient is on Treatment Plan : HEAD/NECK Cisplatin (40) q7d         REVIEW OF SYSTEMS:   Review of Systems - Oncology  All other pertinent systems were reviewed with the patient and are negative.   VITALS:   There were no vitals taken for this visit.  Wt Readings from Last 3 Encounters:  09/22/23 175 lb 14.8 oz (79.8 kg)  09/19/23 176 lb (79.8 kg)  09/15/23 180 lb 1.6 oz (81.7 kg)    There is no height or weight on file to calculate BMI.  Performance status (ECOG): 0 - Asymptomatic  PHYSICAL EXAM:   Physical Exam Constitutional:      General: He is not in acute distress.    Appearance: Normal appearance.  HENT:     Head: Normocephalic and atraumatic.  Eyes:     General: No scleral  icterus.    Extraocular Movements: Extraocular movements intact.     Conjunctiva/sclera: Conjunctivae normal.  Cardiovascular:     Rate and Rhythm: Normal rate and regular rhythm.     Pulses: Normal pulses.     Heart sounds: Normal heart sounds.  Pulmonary:     Effort: Pulmonary effort is normal.     Breath sounds: Normal breath sounds.  Chest:     Comments: Right-sided Port-A-Cath in place without any signs of infection Abdominal:     General: There is no distension.  Musculoskeletal:     Right lower leg: No edema.     Left lower leg: No edema.  Lymphadenopathy:     Cervical: No cervical adenopathy.  Skin:    Findings: No rash.  Neurological:     General: No focal deficit present.     Mental Status: He is alert and oriented to person, place, and time.  Psychiatric:        Mood and Affect: Mood normal.        Behavior: Behavior normal.        Thought Content: Thought content normal.        Judgment: Judgment normal.  LABORATORY DATA:   I have reviewed the data as listed.   Results for orders placed or performed in visit on 09/28/23 (from the past 72 hour(s))  CBC with Differential (Cancer Center Only)     Status: Abnormal   Collection Time: 09/28/23 11:01 AM  Result Value Ref Range   WBC Count 3.9 (L) 4.0 - 10.5 K/uL   RBC 4.87 4.22 - 5.81 MIL/uL   Hemoglobin 14.8 13.0 - 17.0 g/dL   HCT 84.1 32.4 - 40.1 %   MCV 87.7 80.0 - 100.0 fL   MCH 30.4 26.0 - 34.0 pg   MCHC 34.7 30.0 - 36.0 g/dL   RDW 02.7 25.3 - 66.4 %   Platelet Count 197 150 - 400 K/uL   nRBC 0.0 0.0 - 0.2 %   Neutrophils Relative % 52 %   Neutro Abs 2.1 1.7 - 7.7 K/uL   Lymphocytes Relative 32 %   Lymphs Abs 1.2 0.7 - 4.0 K/uL   Monocytes Relative 13 %   Monocytes Absolute 0.5 0.1 - 1.0 K/uL   Eosinophils Relative 2 %   Eosinophils Absolute 0.1 0.0 - 0.5 K/uL   Basophils Relative 1 %   Basophils Absolute 0.0 0.0 - 0.1 K/uL   Immature Granulocytes 0 %   Abs Immature Granulocytes 0.01 0.00 -  0.07 K/uL    Comment: Performed at [facility], [address], [place], [place] [address]  Basic Metabolic Panel - Cancer Center Only     Status: Abnormal   Collection Time: 09/28/23 11:01 AM  Result Value Ref Range   Sodium 138 135 - 145 mmol/L   Potassium 4.1 3.5 - 5.1 mmol/L   Chloride 106 98 - 111 mmol/L   CO2 29 22 - 32 mmol/L   Glucose, Bld 101 (H) 70 - 99 mg/dL    Comment: Glucose reference range applies only to samples taken after fasting for at least 8 hours.   BUN 10 6 - 20 mg/dL   Creatinine 4.25 9.56 - 1.24 mg/dL   Calcium 9.6 8.9 - 38.7 mg/dL   GFR, Estimated >56 >43 mL/min    Comment: (NOTE) Calculated using the CKD-EPI Creatinine Equation (2021)    Anion gap 3 (L) 5 - 15    Comment: Performed at [facility], [address] [place], [place] [address]  Magnesium     Status: None   Collection Time: 09/28/23 11:01 AM  Result Value Ref Range   Magnesium 1.9 1.7 - 2.4 mg/dL    Comment: Performed at [facility], [address] [place], [place] [address]     RADIOGRAPHIC STUDIES:  I have personally reviewed the radiological images as listed and agree with the findings in the report.  IR Gastrostomy Tube  Result Date: 09/22/2023 INDICATION: Chemo/radiation for head and neck cancer; placement of g-tube EXAM: Procedures: 1. FLUOROSCOPIC NASOGASTRIC TUBE PLACEMENT 2. PERCUTANEOUS GASTROSTOMY FEEDING TUBE PLACEMENT COMPARISON:  PET-CT, 09/07/2023. MEDICATIONS: Ancef 2 gm IV; Antibiotics were administered within 1 hour of the procedure. 1.0 mg Glucagon IV. CONTRAST:  20 mL of Isovue 300 administered into the gastric lumen. ANESTHESIA/SEDATION: Moderate (conscious) sedation was employed during this procedure. A total of Versed 2 mg and Fentanyl 100 mcg was administered intravenously. Moderate Sedation Time: 15 minutes. The patient's level of consciousness and vital signs were monitored continuously by  radiology nursing throughout the procedure under my direct supervision. FLUOROSCOPY TIME:  Fluoroscopic dose; 4 mGy COMPLICATIONS: None immediate. PROCEDURE:  Informed written consent was obtained from the patient and/or patient's representative following explanation of the procedure, risks, benefits and alternatives. A time out was performed prior to the initiation of the procedure. Maximal barrier sterile technique utilized including caps, mask, sterile gowns, sterile gloves, large sterile drape, hand hygiene and sterile prep. The LEFT upper quadrant was sterilely prepped and draped. A oral gastric catheter was inserted into the stomach under fluoroscopy. The LEFT costal margin and barium opacified transverse colon were identified and avoided. Air was injected into the stomach for insufflation and visualization under fluoroscopy. Under sterile conditions and local anesthesia, 2 T tacks were utilized to pexy the anterior aspect of the stomach against the ventral abdominal wall. Contrast injection confirmed appropriate positioning of each of the T tacks. An incision was made between the T tacks and a 17 gauge trocar needle was utilized to access the stomach. Needle position was confirmed within the stomach with aspiration of air and injection of a small amount of contrast. A stiff guidewire was advanced into the gastric lumen and under intermittent fluoroscopic guidance, the access needle was exchanged for a telescoping peel-away sheath, ultimately allowing placement of a 16 Fr balloon retention gastrostomy tube. The retention balloon was insufflated with a mixture of dilute saline and contrast and pulled taut against the anterior wall of the stomach. The external disc was cinched. Contrast injection confirms positioning within the stomach. Several spot radiographic images were obtained in various obliquities for documentation. The patient tolerated procedure well without immediate post procedural complication.  FINDINGS: After successful fluoroscopic guided placement, the gastrostomy tube is appropriately positioned with internal retention balloon against the ventral aspect of the gastric lumen. IMPRESSION: Successful percutaneous placement of a 16 Fr balloon retention gastrostomy tube. The gastrostomy may be used immediately for medication administration and in 4 hrs for the initiation of feeds. RECOMMENDATIONS: The patient will return to Vascular Interventional Radiology (VIR) for routine feeding tube evaluation and exchange in 6 months. Roanna Banning, MD [facility] Rad[facility] Electronically Signed   By: Roanna Banning M.D.   On: 09/22/2023 10:44   IR Naso G Tube Plc W/FL W/Rad  Result Date: 09/22/2023 INDICATION: Chemo/radiation for head and neck cancer; placement of g-tube EXAM: Procedures: 1. FLUOROSCOPIC NASOGASTRIC TUBE PLACEMENT 2. PERCUTANEOUS GASTROSTOMY FEEDING TUBE PLACEMENT COMPARISON:  PET-CT, 09/07/2023. MEDICATIONS: Ancef 2 gm IV; Antibiotics were administered within 1 hour of the procedure. 1.0 mg Glucagon IV. CONTRAST:  20 mL of Isovue 300 administered into the gastric lumen. ANESTHESIA/SEDATION: Moderate (conscious) sedation was employed during this procedure. A total of Versed 2 mg and Fentanyl 100 mcg was administered intravenously. Moderate Sedation Time: 15 minutes. The patient's level of consciousness and vital signs were monitored continuously by radiology nursing throughout the procedure under my direct supervision. FLUOROSCOPY TIME:  Fluoroscopic dose; 4 mGy COMPLICATIONS: None immediate. PROCEDURE: Informed written consent was obtained from the patient and/or patient's representative following explanation of the procedure, risks, benefits and alternatives. A time out was performed prior to the initiation of the procedure. Maximal barrier sterile technique utilized including caps, mask, sterile gowns, sterile gloves, large sterile drape, hand  hygiene and sterile prep. The LEFT upper quadrant was sterilely prepped and draped. A oral gastric catheter was inserted into the stomach under fluoroscopy. The LEFT costal margin and barium opacified transverse colon were identified and avoided. Air was injected into the stomach for insufflation and visualization under fluoroscopy. Under sterile conditions and local anesthesia, 2 T tacks were utilized to  pexy the anterior aspect of the stomach against the ventral abdominal wall. Contrast injection confirmed appropriate positioning of each of the T tacks. An incision was made between the T tacks and a 17 gauge trocar needle was utilized to access the stomach. Needle position was confirmed within the stomach with aspiration of air and injection of a small amount of contrast. A stiff guidewire was advanced into the gastric lumen and under intermittent fluoroscopic guidance, the access needle was exchanged for a telescoping peel-away sheath, ultimately allowing placement of a 16 Fr balloon retention gastrostomy tube. The retention balloon was insufflated with a mixture of dilute saline and contrast and pulled taut against the anterior wall of the stomach. The external disc was cinched. Contrast injection confirms positioning within the stomach. Several spot radiographic images were obtained in various obliquities for documentation. The patient tolerated procedure well without immediate post procedural complication. FINDINGS: After successful fluoroscopic guided placement, the gastrostomy tube is appropriately positioned with internal retention balloon against the ventral aspect of the gastric lumen. IMPRESSION: Successful percutaneous placement of a 16 Fr balloon retention gastrostomy tube. The gastrostomy may be used immediately for medication administration and in 4 hrs for the initiation of feeds. RECOMMENDATIONS: The patient will return to Vascular Interventional Radiology (VIR) for routine feeding tube evaluation  and exchange in 6 months. Roanna Banning, MD [facility] Radiology Electronically Signed   By: Roanna Banning M.D.   On: 09/22/2023 10:44   IR IMAGING GUIDED PORT INSERTION  Result Date: 09/22/2023 INDICATION: Chemotherapy for head and neck cancer EXAM: IMPLANTED PORT A CATH PLACEMENT WITH ULTRASOUND AND FLUOROSCOPIC GUIDANCE MEDICATIONS: None ANESTHESIA/SEDATION: Moderate (conscious) sedation was employed during this procedure. A total of Versed 2 mg and Fentanyl 100 mcg was administered intravenously. Moderate Sedation Time: 20 minutes. The patient's level of consciousness and vital signs were monitored continuously by radiology nursing throughout the procedure under my direct supervision. FLUOROSCOPY TIME:  Fluoroscopic dose; 0 mGy COMPLICATIONS: None immediate. PROCEDURE: The procedure, risks, benefits, and alternatives were explained to the patient. Questions regarding the procedure were encouraged and answered. The patient understands and consents to the procedure. The RIGHT neck and chest were prepped with chlorhexidine in a sterile fashion, and a sterile drape was applied covering the operative field. Maximum barrier sterile technique with sterile gowns and gloves were used for the procedure. A timeout was performed prior to the initiation of the procedure. Local anesthesia was provided with 1% lidocaine with epinephrine. After creating a small venotomy incision, a micropuncture kit was utilized to access the internal jugular vein under direct, real-time ultrasound guidance. Ultrasound image documentation was performed. The microwire was kinked to measure appropriate catheter length. A subcutaneous port pocket was then created along the upper chest wall utilizing a combination of sharp and blunt dissection. The pocket was irrigated with sterile saline. A single lumen Non-ISP power injectable port was chosen for placement. The 8 Fr catheter was tunneled from  the port pocket site to the venotomy incision. The port was placed in the pocket. The external catheter was trimmed to appropriate length. At the venotomy, an 8 Fr peel-away sheath was placed over a guidewire under fluoroscopic guidance. The catheter was then placed through the sheath and the sheath was removed. Final catheter positioning was confirmed and documented with a fluoroscopic spot radiograph. The port was accessed with a Huber needle, aspirated and flushed with heparinized saline. The port pocket incision was closed with interrupted 3-0 Vicryl suture then Dermabond was  applied, including at the venotomy incision. Dressings were placed. The patient tolerated the procedure well without immediate post procedural complication. IMPRESSION: Successful placement of a RIGHT internal jugular approach power injectable Port-A-Cath. The tip of the catheter is positioned within the proximal RIGHT atrium. The catheter is ready for immediate use. Roanna Banning, MD [facility] Radiology Electronically Signed   By: Roanna Banning M.D.   On: 09/22/2023 10:29    CODE STATUS:  Code Status History     Date Active Date Inactive Code Status Order ID Comments User Context   09/22/2023 1045 09/23/2023 0514 Full Code [id number]  Roanna Banning, MD [facility]   09/22/2023 1045 09/22/2023 1045 Full Code [id number]  Roanna Banning, MD [facility]    Questions for Most Recent Historical Code Status (Order [id number])     Question Answer   By: Consent: discussion documented in EHR            No orders of the defined types were placed in this encounter.    Future Appointments  Date Time Provider Department Center  09/29/2023  2:45 PM [facility] [id number] [facility] None  09/30/2023  1:15 PM [facility] [id number] [facility] None  10/03/2023 12:45 PM [facility] LINAC 3 [facility] None  10/03/2023  1:00 PM LINAC-SQUIRE [facility] None  10/04/2023  1:15 PM [facility] [id number]  [facility] None  10/05/2023  3:45 PM [facility] LINAC 4 [facility] None  10/06/2023  1:15 PM [facility] LINAC 4 [facility] None  10/07/2023  4:00 PM [facility] [id number] [facility] None  10/10/2023  1:00 PM Bowman, Mary A, LCSW [id number] None  10/10/2023  4:00 PM [facility] [id number] [facility] None  10/11/2023  2:45 PM [facility] LINAC 4 [facility] None  10/12/2023  4:00 PM [facility] LINAC 4 [facility] None  10/13/2023  4:00 PM [facility] LINAC 4 [facility] None  10/14/2023  4:00 PM [facility] LINAC 4 [facility] None  10/17/2023  4:00 PM [facility] LINAC 4 [facility] None  10/18/2023  4:00 PM [facility] LINAC 4 [facility] None  10/19/2023  4:00 PM [facility] LINAC 4 [facility] None  10/20/2023  4:00 PM [facility] LINAC 4 [facility] None  10/21/2023  4:00 PM [facility] LINAC 4 [facility] None  10/24/2023 11:00 AM Bowman, Mary A, LCSW [facility] None  10/24/2023  4:00 PM [facility] LINAC 4 [facility] None  10/25/2023  4:00 PM [facility] LINAC 4 [facility] None  10/26/2023  1:00 PM [facility] LINAC 4 [facility] None  10/31/2023  4:00 PM [facility] LINAC 4 [facility] None  11/01/2023  2:00 PM Thresa Ross, MD [facility] None  11/01/2023  4:00 PM [facility] LINAC 4 [facility] None  11/02/2023  4:00 PM [facility] LINAC 4 [facility] None  11/03/2023  4:00 PM [facility] LINAC 4 [facility] None  11/04/2023  4:00 PM [facility] LINAC 4 [facility] None  11/07/2023  1:00 PM Bowman, Mary A, LCSW [facility] None  11/07/2023  4:00 PM [facility] LINAC 4 [facility] None  11/08/2023  4:00 PM [facility] LINAC 4 [facility] None  11/09/2023  4:00 PM [facility] LINAC 4 [facility] None  11/10/2023  4:00 PM [facility] LINAC 4 [facility] None  11/11/2023  4:00 PM [facility] LINAC 4 [facility] None  11/14/2023  4:00 PM [facility] LINAC 4 [facility] None  11/15/2023  4:00 PM [facility] LINAC 4 [facility] None  11/16/2023  4:00 PM [facility] LINAC 4 [facility] None   06/20/2024  8:40 AM Ashley Royalty, [name], MD [facility]      This document was completed utilizing speech recognition software. Grammatical errors, random word insertions, pronoun errors, and incomplete sentences are an occasional consequence  of this system due to software limitations, ambient noise, and hardware issues. Any formal questions or concerns about the content, text or information contained within the body of this dictation should be directly addressed to the provider for clarification.

## 2023-09-29 NOTE — Telephone Encounter (Signed)
Attempted to contact patient for nutrition assessment. Patient receiving concurrent chemoradiation for HNC. S/p PEG 10/24. Call immediately directed to VM. Left message with request for return call. Contact information provided.

## 2023-09-29 NOTE — Patient Instructions (Signed)
[facility]  Discharge Instructions: Thank you for choosing [facility] to provide your oncology and hematology care.   If you have a lab appointment with the [facility], please go directly to the [facility] and check in at the registration area.   Wear comfortable clothing and clothing appropriate for easy access to any Portacath or PICC line.   We strive to give you quality time with your provider. You may need to reschedule your appointment if you arrive late (15 or more minutes).  Arriving late affects you and other patients whose appointments are after yours.  Also, if you miss three or more appointments without notifying the office, you may be dismissed from the clinic at the provider's discretion.      For prescription refill requests, have your pharmacy contact our office and allow 72 hours for refills to be completed.    Today you received the following chemotherapy and/or immunotherapy agents Cisplatin      To help prevent nausea and vomiting after your treatment, we encourage you to take your nausea medication as directed.  BELOW ARE SYMPTOMS THAT SHOULD BE REPORTED IMMEDIATELY: *FEVER GREATER THAN 100.4 F (38 C) OR HIGHER *CHILLS OR SWEATING *NAUSEA AND VOMITING THAT IS NOT CONTROLLED WITH YOUR NAUSEA MEDICATION *UNUSUAL SHORTNESS OF BREATH *UNUSUAL BRUISING OR BLEEDING *URINARY PROBLEMS (pain or burning when urinating, or frequent urination) *BOWEL PROBLEMS (unusual diarrhea, constipation, pain near the anus) TENDERNESS IN MOUTH AND THROAT WITH OR WITHOUT PRESENCE OF ULCERS (sore throat, sores in mouth, or a toothache) UNUSUAL RASH, SWELLING OR PAIN  UNUSUAL VAGINAL DISCHARGE OR ITCHING   Items with * indicate a potential emergency and should be followed up as soon as possible or go to the Emergency Department if any problems should occur.  Please show the CHEMOTHERAPY ALERT CARD or IMMUNOTHERAPY ALERT CARD at  check-in to the Emergency Department and triage nurse.  Should you have questions after your visit or need to cancel or reschedule your appointment, please contact [facility]  Dept: [phone]  and follow the prompts.  Office hours are 8:00 a.m. to 4:30 p.m. Monday - Friday. Please note that voicemails left after 4:00 p.m. may not be returned until the following business day.  We are closed weekends and major holidays. You have access to a nurse at all times for urgent questions. Please call the main number to the clinic Dept: [phone] and follow the prompts.   For any non-urgent questions, you may also contact your provider using MyChart. We now offer e-Visits for anyone 56 and older to request care online for non-urgent symptoms. For details visit [email or web address].   Also download the MyChart app! Go to the app store, search "MyChart", open the app, select [facility], and log in with your MyChart username and password.

## 2023-09-29 NOTE — Assessment & Plan Note (Signed)
Likely hemorrhoidal bleeding, bright red, no associated pain or constipation. Patient has been using over-the-counter hemorrhoid treatments intermittently with some relief. -Continue Preparation H and Tux pads consistently. -Consider sitz baths with warm water and chlorhexidine. -Increase dietary fiber.

## 2023-09-29 NOTE — Assessment & Plan Note (Signed)
-  Underwent total colectomy in 2016. Currently experiencing watery bowel movements approximately seven times a day. -Plan to refer to a genetic counselor after completion of tonsillar cancer treatments.

## 2023-09-29 NOTE — Assessment & Plan Note (Addendum)
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  -Previously I discussed diagnosis, staging, prognosis, plan of care.  Reviewed NCCN guidelines and discussed treatment options.  Discussed that the head and neck cancer treatments involves multidisciplinary approach -Discussed the benefits and risks of radiation alone vs combination of chemotherapy and radiation. Given the patient's age and the size of the lymph node which is borderline at 3 cm, the benefits of adding chemotherapy slightly outweigh the risks. -Discussed side effect profile with cisplatin including, but not limited to, nausea, vomiting, electrolyte disturbances, renal dysfunction, tinnitus, hearing loss, neuropathy, cytopenias etc.  After discussing side effect profile, patient did opt for weekly cisplatin.  Plan is to proceed with weekly cisplatin at 40 mg/m dose during the course of radiation.  He will receive appropriate pre and post hydration and IV nausea medications. -He completed Port-A-Cath placement, feeding tube placement and chemo education. -He started radiation treatments from 09/27/2023.   -Labs reviewed from yesterday.  No dose-limiting toxicities.  Will proceed with first dose of cisplatin today. -Advised to stay hydrated, aiming for up to three liters of water or non-caffeinated drinks per day -Plan to see patient next Wednesday before chemotherapy.

## 2023-09-30 ENCOUNTER — Telehealth: Payer: Self-pay

## 2023-09-30 ENCOUNTER — Ambulatory Visit
Admission: RE | Admit: 2023-09-30 | Discharge: 2023-09-30 | Disposition: A | Discharge status: Home / Self Care | Payer: BC Managed Care – PPO | Source: Ambulatory Visit | Attending: Radiation Oncology | Admitting: Radiation Oncology

## 2023-09-30 ENCOUNTER — Other Ambulatory Visit: Payer: Self-pay

## 2023-09-30 DIAGNOSIS — Z931 Gastrostomy status: Secondary | ICD-10-CM | POA: Diagnosis not present

## 2023-09-30 DIAGNOSIS — C090 Malignant neoplasm of tonsillar fossa: Secondary | ICD-10-CM | POA: Diagnosis present

## 2023-09-30 DIAGNOSIS — Z510 Encounter for antineoplastic radiation therapy: Secondary | ICD-10-CM | POA: Diagnosis not present

## 2023-09-30 DIAGNOSIS — Y842 Radiological procedure and radiotherapy as the cause of abnormal reaction of the patient, or of later complication, without mention of misadventure at the time of the procedure: Secondary | ICD-10-CM | POA: Insufficient documentation

## 2023-09-30 DIAGNOSIS — Z5111 Encounter for antineoplastic chemotherapy: Secondary | ICD-10-CM | POA: Diagnosis not present

## 2023-09-30 DIAGNOSIS — G893 Neoplasm related pain (acute) (chronic): Secondary | ICD-10-CM | POA: Insufficient documentation

## 2023-09-30 DIAGNOSIS — D701 Agranulocytosis secondary to cancer chemotherapy: Secondary | ICD-10-CM | POA: Diagnosis not present

## 2023-09-30 DIAGNOSIS — K1233 Oral mucositis (ulcerative) due to radiation: Secondary | ICD-10-CM | POA: Diagnosis not present

## 2023-09-30 LAB — RAD ONC ARIA SESSION SUMMARY
Course Elapsed Days: 3
Plan Fractions Treated to Date: 4
Plan Prescribed Dose Per Fraction: 2 Gy
Plan Total Fractions Prescribed: 35
Plan Total Prescribed Dose: 70 Gy
Reference Point Dosage Given to Date: 8 Gy
Reference Point Session Dosage Given: 2 Gy
Session Number: 4

## 2023-09-30 NOTE — Telephone Encounter (Signed)
-----   Message from Nurse Barbara Cower E sent at 09/29/2023  2:22 PM EDT ----- Regarding: first time treatment call back- Harry Williams- Cisplatin Patient received chemo for the first time today. He is followed by Dr [name], he received Cisplatin with no issues. He tolerated treatment well.

## 2023-09-30 NOTE — Telephone Encounter (Signed)
LM for patient that this nurse was calling to see how they were doing after their treatment. Please call back to Dr. Zenda Alpers nurse at [phone] if they have any questions or concerns regarding the treatment.

## 2023-10-03 ENCOUNTER — Telehealth: Payer: Self-pay | Admitting: Oncology

## 2023-10-03 ENCOUNTER — Other Ambulatory Visit: Payer: Self-pay

## 2023-10-03 ENCOUNTER — Ambulatory Visit
Admission: RE | Admit: 2023-10-03 | Discharge: 2023-10-03 | Disposition: A | Discharge status: Home / Self Care | Payer: BC Managed Care – PPO | Source: Ambulatory Visit | Attending: Radiation Oncology | Admitting: Radiation Oncology

## 2023-10-03 ENCOUNTER — Telehealth: Payer: Self-pay

## 2023-10-03 ENCOUNTER — Ambulatory Visit
Admission: RE | Admit: 2023-10-03 | Discharge: 2023-10-03 | Disposition: A | Discharge status: Home / Self Care | Payer: BC Managed Care – PPO | Source: Ambulatory Visit | Attending: Radiation Oncology

## 2023-10-03 DIAGNOSIS — H538 Other visual disturbances: Secondary | ICD-10-CM

## 2023-10-03 DIAGNOSIS — Z5111 Encounter for antineoplastic chemotherapy: Secondary | ICD-10-CM | POA: Diagnosis not present

## 2023-10-03 DIAGNOSIS — C090 Malignant neoplasm of tonsillar fossa: Secondary | ICD-10-CM

## 2023-10-03 LAB — RAD ONC ARIA SESSION SUMMARY
Course Elapsed Days: 6
Plan Fractions Treated to Date: 5
Plan Prescribed Dose Per Fraction: 2 Gy
Plan Total Fractions Prescribed: 35
Plan Total Prescribed Dose: 70 Gy
Reference Point Dosage Given to Date: 10 Gy
Reference Point Session Dosage Given: 2 Gy
Session Number: 5

## 2023-10-03 MED ORDER — SONAFINE EX EMUL
1.0000 | Freq: Two times a day (BID) | CUTANEOUS | Status: DC
  Administered 2023-10-03: 1 via TOPICAL

## 2023-10-03 NOTE — Telephone Encounter (Signed)
Patient spouse is aware of scheduled appointment times/dates for follow ups and scheduled treatment dates

## 2023-10-03 NOTE — Telephone Encounter (Signed)
Spoke with pt's wife Alyssa to confirm appt on 10/04/2023 w/Kate in [facility] for further assessment of vision changes and hisccups.

## 2023-10-03 NOTE — Telephone Encounter (Signed)
Returning a patient phone call regarding new symptoms. Patient stated still having blurry vision, double vision, color changes, hiccups that were constant. Patient stated that these symptoms have been going on since 09/29/23 from new chemo medication, symptoms have not gotten any worse. Cisplatin. Patient also reported no vomiting, occasional nausea, drinking 80 oz of water. Made patient aware that he would get a phone call back on regarding what to do.   Called the on call provider Dr. Cherly Hensen due to Dr. [name] being out of the office. Made provider aware that the patient has malignant neoplasm of tonsillar fossa stage 1, vision changes, constant hiccups, started on 10/31, first time on Cisplatin, symptoms have not gotten any worse. Per Dr. Cherly Hensen if any changes get worse go to the ED and if not System Management can see him tomorrow. A referral for a Ophthalmologist to be placed.

## 2023-10-04 ENCOUNTER — Other Ambulatory Visit: Payer: Self-pay | Admitting: Oncology

## 2023-10-04 ENCOUNTER — Other Ambulatory Visit: Payer: Self-pay

## 2023-10-04 ENCOUNTER — Inpatient Hospital Stay: Payer: BC Managed Care – PPO

## 2023-10-04 ENCOUNTER — Inpatient Hospital Stay (HOSPITAL_BASED_OUTPATIENT_CLINIC_OR_DEPARTMENT_OTHER): Payer: BC Managed Care – PPO | Admitting: Physician Assistant

## 2023-10-04 ENCOUNTER — Ambulatory Visit
Admission: RE | Admit: 2023-10-04 | Discharge: 2023-10-04 | Disposition: A | Discharge status: Home / Self Care | Payer: BC Managed Care – PPO | Source: Ambulatory Visit | Attending: Radiation Oncology | Admitting: Radiation Oncology

## 2023-10-04 VITALS — BP 121/77 | HR 72 | Temp 98.2°F | Resp 18 | Ht 68.0 in | Wt 176.0 lb

## 2023-10-04 DIAGNOSIS — Z5111 Encounter for antineoplastic chemotherapy: Secondary | ICD-10-CM | POA: Insufficient documentation

## 2023-10-04 DIAGNOSIS — Z95828 Presence of other vascular implants and grafts: Secondary | ICD-10-CM | POA: Insufficient documentation

## 2023-10-04 DIAGNOSIS — R066 Hiccough: Secondary | ICD-10-CM | POA: Diagnosis not present

## 2023-10-04 DIAGNOSIS — C090 Malignant neoplasm of tonsillar fossa: Secondary | ICD-10-CM

## 2023-10-04 DIAGNOSIS — Z931 Gastrostomy status: Secondary | ICD-10-CM | POA: Insufficient documentation

## 2023-10-04 DIAGNOSIS — H539 Unspecified visual disturbance: Secondary | ICD-10-CM | POA: Diagnosis not present

## 2023-10-04 LAB — BASIC METABOLIC PANEL - CANCER CENTER ONLY
Anion gap: 5 (ref 5–15)
BUN: 14 mg/dL (ref 6–20)
CO2: 28 mmol/L (ref 22–32)
Calcium: 8.9 mg/dL (ref 8.9–10.3)
Chloride: 102 mmol/L (ref 98–111)
Creatinine: 1 mg/dL (ref 0.61–1.24)
GFR, Estimated: >60 mL/min (ref >60)
Glucose, Bld: 79 mg/dL (ref 70–99)
Potassium: 3.9 mmol/L (ref 3.5–5.1)
Sodium: 135 mmol/L (ref 135–145)

## 2023-10-04 LAB — CBC WITH DIFFERENTIAL (CANCER CENTER ONLY)
Abs Immature Granulocytes: 0.04 10*3/uL (ref 0.00–0.07)
Basophils Absolute: 0 10*3/uL (ref 0.0–0.1)
Basophils Relative: 0 %
Eosinophils Absolute: 0.1 10*3/uL (ref 0.0–0.5)
Eosinophils Relative: 1 %
HCT: 41.8 % (ref 39.0–52.0)
Hemoglobin: 14.7 g/dL (ref 13.0–17.0)
Immature Granulocytes: 1 %
Lymphocytes Relative: 15 %
Lymphs Abs: 0.9 10*3/uL (ref 0.7–4.0)
MCH: 30.4 pg (ref 26.0–34.0)
MCHC: 35.2 g/dL (ref 30.0–36.0)
MCV: 86.4 fL (ref 80.0–100.0)
Monocytes Absolute: 0.7 10*3/uL (ref 0.1–1.0)
Monocytes Relative: 12 %
Neutro Abs: 4.1 10*3/uL (ref 1.7–7.7)
Neutrophils Relative %: 71 %
Platelet Count: 189 10*3/uL (ref 150–400)
RBC: 4.84 MIL/uL (ref 4.22–5.81)
RDW: 11.8 % (ref 11.5–15.5)
WBC Count: 5.7 10*3/uL (ref 4.0–10.5)
nRBC: 0 % (ref 0.0–0.2)

## 2023-10-04 LAB — RAD ONC ARIA SESSION SUMMARY
Course Elapsed Days: 7
Plan Fractions Treated to Date: 6
Plan Prescribed Dose Per Fraction: 2 Gy
Plan Total Fractions Prescribed: 35
Plan Total Prescribed Dose: 70 Gy
Reference Point Dosage Given to Date: 12 Gy
Reference Point Session Dosage Given: 2 Gy
Session Number: 6

## 2023-10-04 LAB — MAGNESIUM: Magnesium: 2 mg/dL (ref 1.7–2.4)

## 2023-10-04 MED ORDER — HEPARIN SOD (PORK) LOCK FLUSH 100 UNIT/ML IV SOLN
500.0000 [IU] | Freq: Once | INTRAVENOUS | Status: AC
  Administered 2023-10-04: 500 [IU]

## 2023-10-04 MED ORDER — SODIUM CHLORIDE 0.9% FLUSH
10.0000 mL | Freq: Once | INTRAVENOUS | Status: AC
  Administered 2023-10-04: 10 mL

## 2023-10-04 MED ORDER — BACLOFEN 10 MG PO TABS: 10.0000 mg | ORAL_TABLET | Freq: Three times a day (TID) | ORAL | 0 refills | Status: DC

## 2023-10-04 MED FILL — Fosaprepitant Dimeglumine For IV Infusion 150 MG (Base Eq): INTRAVENOUS | Qty: 5 | Status: AC

## 2023-10-04 NOTE — Progress Notes (Signed)
Symptom Management Consult Note [facility]    Patient Care Team: [name], MD as PCP - General (Family Medicine) Noe Gens, MD as Referring Physician (Otolaryngology) [name], MD as Attending Physician (Radiation Oncology) [name], [name], RN as Oncology Nurse Navigator [name], MD as Consulting Physician (Oncology)    Name / MRN / DOBIhan Williams  [id number]  02/07/1981   Date of visit: 10/04/2023   Chief Complaint/Reason for visit: hiccups   Current Therapy: Cisplatin  Last treatment:  Day 1   Cycle 1 on 09/29/23   ASSESSMENT & PLAN: Patient is a 42 y.o. male with oncologic history of left tonsillar squamous cell carcinoma followed by Dr. [name].  I have viewed most recent oncology note and lab work.    #Left tonsillar squamous cell carcinoma  - Next appointment with oncologist is tomorrow 10/05/23 -Labs checked today for scheduled treatment tomorrow and are within parameters   #Visual changes -Concern for optic neuritis based on HPI. PE reveals equal and reactive pupils. No visual field loss. -Referral was sent yesterday by on call oncologist to on call ophthalmologist Dr. Cathey Endow. -Will defer to oncologist whether to start steroids or continue to monitor as he will be seen at 0830 tomorrow.  #Intractable hiccups -Persistent hiccups since recent cisplatin treatment, not resolved with Compazine. Could be related to decadron. -Prescription sent in for Baclofen to help calm diaphragm and reduce hiccups.  #Acid reflux symptoms -New onset heartburn, potentially related to frequent hiccups or recent treatment. Partial relief with Omeprazole. -Consider trial of Pepcid AC or Maalox/Mylanta for additional relief.   Strict ED precautions discussed should symptoms worsen.   Heme/Onc History: Oncology History  Malignant neoplasm of tonsillar fossa (HCC)  07/20/2023 Imaging   CT neck soft tissue: 2.5 x 3 cm mass  in the left tonsillar region with extension towards the tongue base, suspicious for carcinoma, and evidence of level 2 lymphadenopathy on the left, consisting of a dominant node measuring 3 x 2 x 1.7 cm. A benign appearing osteoma was also demonstrated, projecting inferior from the angle of the left mandible.    08/26/2023 Initial Diagnosis   Malignant neoplasm of tonsillar fossa [facility]):  He presented to his PCP for routine follow-up in July 2024 and was incidentally noted to have left submandibular swelling on a routine examination performed at that time. A soft tissue head and neck ultrasound was subsequently performed on 07/07/23 which showed an ovoid mass measuring 2.8 x 1.9 cm, most consistent with an abnormally enlarged lymph node, and correlating with the palpable area in the left submandibular region.    A soft tissue neck CT was also performed on 07/20/23 which demonstrated: a  2.5 x 3 cm mass in the left tonsillar region with extension towards the tongue base, suspicious for carcinoma, and evidence of level 2 lymphadenopathy on the left, consisting of a dominant node measuring 3 x 2 x 1.7 cm. A benign appearing osteoma was also demonstrated, projecting inferior from the angle of the left mandible.   Subsequently, the patient was referred to Dr. Roma Schanz at [facility] and underwent biopsies of the left tonsil on 08/26/23. 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    08/26/2023 Pathology Results   Patient was referred to Dr. Roma Schanz at [facility] and underwent biopsies of the left tonsil on 08/26/23. 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  09/07/2023 PET scan   PET scan performed on 09/07/23 at [facility]: intense radiotracer uptake within the left palatine tonsillar mass; adjacent radiotracer uptake within the region of the right palatine tonsil; and hypermetabolic nodes  in the left neck extending to the supraclavicular region concerning for nodal metastases. Focal uptake at the anorectal junction was also demonstrated, possibly reflecting muscular contraction, however local malignancy can not be excluded. PET otherwise showed no evidence of more distant metastatic disease.    09/14/2023 Cancer Staging   Staging form: Pharynx - HPV-Mediated Oropharynx, AJCC 8th Edition - Clinical stage from 09/14/2023: Stage I (cT2, cN1, cM0, p16+) - Signed by [name], MD on 09/14/2023 Stage prefix: Initial diagnosis   09/29/2023 -  Chemotherapy   Patient is on Treatment Plan : HEAD/NECK Cisplatin (40) q7d         Interval history-:Discussed the use of AI scribe software for clinical note transcription with the patient, who gave verbal consent to proceed.   Harry Williams is a 42 y.o. male with oncologic history as above presenting to [facility] today with chief complaint of with new onset visual disturbances, hiccups, and heartburn. He is accompanied by spouse who provides additional history.  The visual changes, described as a "3D" effect and double vision, began immediately following the first chemotherapy session on 09/29/23. The patient reports that the letters appear to be coming off each other and slightly offset, with the symptom being constant since onset. The patient has a history of good vision, with no need for corrective lenses in the past. He reports that color perception appears dull since the chemotherapy. There is no associated eye pain or excessive tearing, but the patient does report a mild headache located behind the ears that he rates 1/10 in severity. The headache is similar to those he has had in the past.  In addition to the visual changes, the patient has been experiencing persistent hiccups since the chemotherapy treatment. The hiccups are constant throughout the day, only subsiding during sleep. This is a new symptom for the patient, who has not had  issues with hiccups in the past. The patient also reports new onset heartburn, which is located centrally in the chest. Over-the-counter remedies such as Tums and omeprazole have been tried with some relief.  The patient also has a feeding tube in place, presumably in anticipation of potential side effects from the chemotherapy and radiation treatment. Despite the visual changes, hiccups, and heartburn, the patient reports good appetite and hydration.    ROS  All other systems are reviewed and are negative for acute change except as noted in the HPI.    No Known Allergies   Past Medical History:  Diagnosis Date   Anxiety    Asthma    Avascular necrosis of hip, left (HCC) 07/24/2021   Bright red blood per rectum 12/16/2022   Closed fracture of nasal bone with routine healing 12/24/2016   Depression    PTSD (post-traumatic stress disorder)      Past Surgical History:  Procedure Laterality Date   APPENDECTOMY     COLON SURGERY  03/30/2015   IR GASTROSTOMY TUBE MOD SED  09/22/2023   IR IMAGING GUIDED PORT INSERTION  09/22/2023   IR NASO G TUBE PLC W/FL W/RAD  09/22/2023   JOINT REPLACEMENT  2008   partial right elbow; right femoral graft   Total abdominal proctocolectomy, J-pouch, ileal pouch anal anastomosis  2016    Social History   Socioeconomic History   Marital status:  Married    Spouse name: Teacher, English as a foreign language   Number of children: 3   Years of education: 12+   Highest education level: Some college, no degree  Occupational History   Not on file  Tobacco Use   Smoking status: Never   Smokeless tobacco: Never  Vaping Use   Vaping status: Never Used  Substance and Sexual Activity   Alcohol use: Yes    Comment: Occasional   Drug use: Never   Sexual activity: Yes    Partners: Female  Other Topics Concern   Not on file  Social History Narrative   ** Merged History Encounter **       Social Determinants of Health   Financial Resource Strain: Not on file  Food  Insecurity: No Food Insecurity (04/01/2022)   Hunger Vital Sign    Worried About Running Out of Food in the Last Year: Never true    Ran Out of Food in the Last Year: Never true  Transportation Needs: No Transportation Needs (04/01/2022)   PRAPARE - Administrator, Civil Service (Medical): No    Lack of Transportation (Non-Medical): No  Physical Activity: Not on file  Stress: Not on file  Social Connections: Not on file  Intimate Partner Violence: Not At Risk (04/01/2022)   Humiliation, Afraid, Rape, and Kick questionnaire    Fear of Current or Ex-Partner: No    Emotionally Abused: No    Physically Abused: No    Sexually Abused: No    Family History  Problem Relation Age of Onset   Arthritis Mother    Anemia Mother    Diabetes Father    Hypertension Brother    Asthma Daughter    Heart disease Paternal Grandmother    Cancer Paternal Grandfather      Current Outpatient Medications:    baclofen (LIORESAL) 10 MG tablet, Take 1 tablet (10 mg total) by mouth 3 (three) times daily., Disp: 30 each, Rfl: 0   celecoxib (CELEBREX) 200 MG capsule, Take 200 mg by mouth daily as needed for moderate pain (pain score 4-6)., Disp: , Rfl:    dexamethasone (DECADRON) 4 MG tablet, Take 2 tablets (8 mg) by mouth daily x 3 days starting the day after cisplatin chemotherapy. Take with food., Disp: 30 tablet, Rfl: 1   HYDROcodone-acetaminophen (NORCO) 5-325 MG tablet, Take 1-2 tablets by mouth every 6 (six) hours as needed for moderate pain (pain score 4-6)., Disp: 15 tablet, Rfl: 0   lidocaine-prilocaine (EMLA) cream, Apply to affected area once, Disp: 30 g, Rfl: 3   ondansetron (ZOFRAN) 8 MG tablet, Take 1 tablet (8 mg total) by mouth every 8 (eight) hours as needed for nausea or vomiting. Start on the third day after cisplatin., Disp: 30 tablet, Rfl: 1   PARoxetine (PAXIL CR) 12.5 MG 24 hr tablet, Take 1 tablet (12.5 mg total) by mouth daily., Disp: 30 tablet, Rfl: 0   prochlorperazine  (COMPAZINE) 10 MG tablet, Take 1 tablet (10 mg total) by mouth every 6 (six) hours as needed (Nausea or vomiting)., Disp: 30 tablet, Rfl: 1   Vitamin D, Ergocalciferol, (DRISDOL) 1.25 MG (50000 UNIT) CAPS capsule, Take 1 capsule (50,000 Units total) by mouth every 7 (seven) days. Take for 8 total doses(weeks), Disp: 8 capsule, Rfl: 0  PHYSICAL EXAM: ECOG FS:1 - Symptomatic but completely ambulatory    Vitals:   10/04/23 1343  BP: 121/77  Pulse: 72  Resp: 18  Temp: 98.2 F (36.8 C)  TempSrc: Oral  SpO2: 100%  Weight: 176 lb (79.8 kg)  Height: 5\' 8"  (1.727 m)   Physical Exam Vitals and nursing note reviewed.  Constitutional:      Appearance: He is not ill-appearing or toxic-appearing.  HENT:     Head: Normocephalic.  Eyes:     Extraocular Movements: Extraocular movements intact.     Conjunctiva/sclera: Conjunctivae normal.     Pupils: Pupils are equal, round, and reactive to light.  Cardiovascular:     Rate and Rhythm: Normal rate and regular rhythm.     Pulses: Normal pulses.     Heart sounds: Normal heart sounds.  Pulmonary:     Effort: Pulmonary effort is normal.     Breath sounds: Normal breath sounds.  Abdominal:     General: There is no distension.  Musculoskeletal:     Cervical back: Normal range of motion.  Skin:    General: Skin is warm and dry.  Neurological:     Mental Status: He is alert.        LABORATORY DATA: I have reviewed the data as listed    Latest Ref Rng & Units 10/04/2023    1:50 PM 09/28/2023   11:01 AM 09/22/2023    8:26 AM  CBC  WBC 4.0 - 10.5 K/uL 5.7  3.9  4.3   Hemoglobin 13.0 - 17.0 g/dL 78.2  95.6  21.3   Hematocrit 39.0 - 52.0 % 41.8  42.7  45.5   Platelets 150 - 400 K/uL 189  197  206         Latest Ref Rng & Units 10/04/2023    1:50 PM 09/28/2023   11:01 AM 09/22/2023    8:26 AM  CMP  Glucose 70 - 99 mg/dL 79  086  90   BUN 6 - 20 mg/dL 14  10  8    Creatinine 0.61 - 1.24 mg/dL 5.78  4.69  6.29   Sodium 135 - 145  mmol/L 135  138  139   Potassium 3.5 - 5.1 mmol/L 3.9  4.1  4.2   Chloride 98 - 111 mmol/L 102  106  106   CO2 22 - 32 mmol/L 28  29  27    Calcium 8.9 - 10.3 mg/dL 8.9  9.6  9.1        RADIOGRAPHIC STUDIES (from last 24 hours if applicable) I have personally reviewed the radiological images as listed and agreed with the findings in the report. No results found.      Visit Diagnosis: 1. Malignant neoplasm of tonsillar fossa (HCC)   2. Visual changes   3. Intractable hiccups      No orders of the defined types were placed in this encounter.   All questions were answered. The patient knows to call the clinic with any problems, questions or concerns. No barriers to learning was detected.  A total of more than 30 minutes were spent on this encounter with face-to-face time and non-face-to-face time, including preparing to see the patient, ordering tests and/or medications, counseling the patient and coordination of care as outlined above.    Thank you for allowing me to participate in the care of this patient.    Shanon Ace, PA-C Department of Hematology/Oncology [facility] at [facility] Phone: [phone]  [phone]    10/04/2023 3:33 PM

## 2023-10-05 ENCOUNTER — Ambulatory Visit
Admission: RE | Admit: 2023-10-05 | Discharge: 2023-10-05 | Disposition: A | Discharge status: Home / Self Care | Payer: BC Managed Care – PPO | Source: Ambulatory Visit | Attending: Radiation Oncology

## 2023-10-05 ENCOUNTER — Encounter: Payer: Self-pay | Admitting: Oncology

## 2023-10-05 ENCOUNTER — Inpatient Hospital Stay: Payer: BC Managed Care – PPO

## 2023-10-05 ENCOUNTER — Inpatient Hospital Stay (HOSPITAL_BASED_OUTPATIENT_CLINIC_OR_DEPARTMENT_OTHER): Payer: BC Managed Care – PPO | Admitting: Oncology

## 2023-10-05 ENCOUNTER — Telehealth: Payer: Self-pay

## 2023-10-05 ENCOUNTER — Other Ambulatory Visit: Payer: Self-pay

## 2023-10-05 ENCOUNTER — Inpatient Hospital Stay: Payer: BC Managed Care – PPO | Admitting: Dietician

## 2023-10-05 ENCOUNTER — Other Ambulatory Visit: Payer: Self-pay | Admitting: Physician Assistant

## 2023-10-05 VITALS — BP 134/78 | HR 96 | Temp 98.9°F | Resp 18 | Ht 68.0 in | Wt 176.1 lb

## 2023-10-05 DIAGNOSIS — D1391 Familial adenomatous polyposis: Secondary | ICD-10-CM

## 2023-10-05 DIAGNOSIS — H539 Unspecified visual disturbance: Secondary | ICD-10-CM | POA: Insufficient documentation

## 2023-10-05 DIAGNOSIS — Z5111 Encounter for antineoplastic chemotherapy: Secondary | ICD-10-CM | POA: Diagnosis not present

## 2023-10-05 DIAGNOSIS — Z95828 Presence of other vascular implants and grafts: Secondary | ICD-10-CM

## 2023-10-05 DIAGNOSIS — K219 Gastro-esophageal reflux disease without esophagitis: Secondary | ICD-10-CM | POA: Insufficient documentation

## 2023-10-05 DIAGNOSIS — C090 Malignant neoplasm of tonsillar fossa: Secondary | ICD-10-CM

## 2023-10-05 DIAGNOSIS — R066 Hiccough: Secondary | ICD-10-CM | POA: Insufficient documentation

## 2023-10-05 LAB — RAD ONC ARIA SESSION SUMMARY
Course Elapsed Days: 8
Plan Fractions Treated to Date: 7
Plan Prescribed Dose Per Fraction: 2 Gy
Plan Total Fractions Prescribed: 35
Plan Total Prescribed Dose: 70 Gy
Reference Point Dosage Given to Date: 14 Gy
Reference Point Session Dosage Given: 2 Gy
Session Number: 7

## 2023-10-05 MED ORDER — DEXAMETHASONE SODIUM PHOSPHATE 10 MG/ML IJ SOLN: 10.0000 mg | Freq: Once | INTRAMUSCULAR | Status: DC

## 2023-10-05 MED ORDER — PREDNISONE 20 MG PO TABS: ORAL_TABLET | ORAL | 1 refills | Status: DC

## 2023-10-05 MED ORDER — SODIUM CHLORIDE 0.9% FLUSH
10.0000 mL | INTRAVENOUS | Status: DC | PRN
  Administered 2023-10-05: 10 mL

## 2023-10-05 MED ORDER — SODIUM CHLORIDE 0.9 % IV SOLN: INTRAVENOUS | Status: DC

## 2023-10-05 MED ORDER — MAGNESIUM SULFATE 2 GM/50ML IV SOLN
2.0000 g | Freq: Once | INTRAVENOUS | Status: AC
  Administered 2023-10-05: 2 g via INTRAVENOUS
  Filled 2023-10-05: qty 50

## 2023-10-05 MED ORDER — PALONOSETRON HCL INJECTION 0.25 MG/5ML: 0.2500 mg | Freq: Once | INTRAVENOUS | Status: DC

## 2023-10-05 MED ORDER — METHYLPREDNISOLONE SODIUM SUCC 125 MG IJ SOLR: 125.0000 mg | Freq: Once | INTRAMUSCULAR | Status: DC

## 2023-10-05 MED ORDER — HEPARIN SOD (PORK) LOCK FLUSH 100 UNIT/ML IV SOLN
500.0000 [IU] | Freq: Once | INTRAVENOUS | Status: AC | PRN
  Administered 2023-10-05: 500 [IU]

## 2023-10-05 MED ORDER — SODIUM CHLORIDE 0.9 % IV SOLN
40.0000 mg/m2 | Freq: Once | INTRAVENOUS | Status: DC
  Filled 2023-10-05: qty 79

## 2023-10-05 MED ORDER — SODIUM CHLORIDE 0.9 % IV SOLN
150.0000 mg | Freq: Once | INTRAVENOUS | Status: DC
  Filled 2023-10-05: qty 5

## 2023-10-05 MED ORDER — METHYLPREDNISOLONE SODIUM SUCC 125 MG IJ SOLR
125.0000 mg | Freq: Once | INTRAMUSCULAR | Status: AC
  Administered 2023-10-05: 125 mg via INTRAVENOUS
  Filled 2023-10-05: qty 2

## 2023-10-05 MED ORDER — LACTATED RINGERS IV SOLN
Freq: Once | INTRAVENOUS | Status: AC
Start: 2023-10-05 — End: 2023-10-05

## 2023-10-05 MED ORDER — POTASSIUM CHLORIDE IN NACL 20-0.9 MEQ/L-% IV SOLN
Freq: Once | INTRAVENOUS | Status: AC
  Filled 2023-10-05: qty 1000

## 2023-10-05 NOTE — Progress Notes (Addendum)
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  -Previously discussed the benefits and risks of radiation alone vs combination of chemotherapy and radiation. Given the patient's age and the size of the lymph node which is borderline at 3 cm, the benefits of adding chemotherapy slightly outweigh the risks. -Discussed side effect profile with cisplatin including, but not limited to, nausea, vomiting, electrolyte disturbances, renal dysfunction, tinnitus, hearing loss, neuropathy, cytopenias etc.  After discussing side effect profile, patient did opt for weekly cisplatin.  Plan made to proceed with weekly cisplatin at 40 mg/m dose during the course of radiation with appropriate pre and post hydration and IV nausea medications. -He started radiation treatments from 09/27/2023.  Started first dose of cisplatin on 09/29/2023. -On the same day that he received cisplatin, he developed visual changes, described as a "3D" effect and double vision. The patient reports that the letters appear to be coming off each other and slightly offset, with the symptom being constant since onset. The patient has a history of good vision, with no need for corrective lenses in the past.  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  Though it is rare to happen with just 1 dose of  cisplatin, given his symptoms, we cannot rule this out.  An ophthalmology referral has been placed already.  We will hold further cisplatin. -We started him on Solu-Medrol 125 mg 1 dose today and prednisone 1 mg/kg daily orally from tomorrow with plan to taper over the next 3 to 4 weeks. -He was advised to continue radiation treatments alone for now.  Patient verbalized understanding and he is in agreement with the plan.  Visual disturbances -On the same day that he received cisplatin, he developed visual changes, described as a "3D" effect and double vision. The patient reports that the letters appear to be coming off each other and slightly offset, with the symptom being constant since onset. The patient has a history of good vision, with no need for corrective lenses in the past.  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  Though it is rare to happen with just 1 dose of cisplatin, given his symptoms, we cannot rule this out.  An ophthalmology referral has been placed already.  We will hold further cisplatin. -We started him on Solu-Medrol 125 mg 1 dose today and prednisone 1 mg/kg daily orally from tomorrow with plan to taper over the next 3 to 4 weeks.  Familial adenomatous polyposis -Underwent total colectomy in 2016. Currently experiencing watery bowel movements approximately seven times a day. -Plan to refer to a genetic counselor after completion of tonsillar cancer treatments.  GERD without esophagitis Potential for exacerbation with high-dose steroids. -Continue Omeprazole. -Advise over-the-counter Pepcid or Maalox as needed for heartburn.  Hiccups Improved with over-the-counter medications and Baclofen. -Continue current management.     I reviewed  lab results and outside records for this visit and discussed relevant results with the patient. Diagnosis, plan  of care and treatment options were also discussed in detail with the patient. Opportunity provided to ask questions and answers provided to his apparent satisfaction. Provided instructions to call our clinic with any problems, questions or concerns prior to return visit. I recommended to continue follow-up with PCP and sub-specialists. He verbalized understanding and agreed with the plan.   NCCN guidelines have been consulted in the planning of this patient's care.  I provided 30 minutes of face-to-face time during this encounter and > 50% was spent counseling as documented under my assessment and plan.    [name], MD  10/05/2023 10:42 AM  [facility] AT [facility] [address] [place] [address] Dept: [phone] Dept Fax: [phone]    CHIEF COMPLAINT/ REASON FOR VISIT:   Recently diagnosed squamous of carcinoma of left tonsillar fossa, p16 positive.  Current Treatment: Concurrent chemoradiation with weekly cisplatin, chemo started from 09/29/2023  INTERVAL HISTORY:    Discussed the use of AI scribe software for clinical note transcription with the patient, who gave verbal consent to proceed.   Harry Williams is here today for repeat clinical assessment. He denies fevers or chills. He denies pain. His appetite is good. His weight has been stable.  He reports blurred vision and dull color perception that started after chemotherapy. The blurring is noticeable at a distance of about 10 feet. He also reports hiccups and heartburn, which have improved with over-the-counter medications and baclofen. He denies nausea, vomiting, tingling, and numbness. He is able to eat and drink normally. He is currently undergoing radiation treatments without any problems. He also reports headaches, rating them as a 3-4 out of 10 in severity. He denies any ringing in the ears or hearing loss. He has not experienced any swelling in the legs.  He is eating well and  maintaining hydration. His blood work from the previous day was unremarkable.  I have reviewed the past medical history, past surgical history, social history and family history with the patient and they are unchanged from previous note.  ALLERGIES: He has No Known Allergies.  MEDICATIONS:  Current Outpatient Medications  Medication Sig Dispense Refill   predniSONE (DELTASONE) 20 MG tablet Take 4 tablets by mouth once daily (80 mg dose) for 7 days, followed by 3 tablets by mouth once daily, continue until further instructions 60 tablet 1   baclofen (LIORESAL) 10 MG tablet Take 1 tablet (10 mg total) by mouth 3 (three) times daily. 30 each 0   celecoxib (CELEBREX) 200 MG capsule Take 200 mg by mouth daily as needed for moderate pain (pain score 4-6).     dexamethasone (DECADRON) 4 MG tablet Take 2 tablets (8 mg) by mouth daily x 3 days starting the day after cisplatin chemotherapy. Take with food. 30 tablet 1   HYDROcodone-acetaminophen (NORCO) 5-325 MG tablet Take 1-2 tablets by mouth every 6 (six) hours as needed for moderate pain (pain score 4-6). 15 tablet 0   lidocaine-prilocaine (EMLA) cream Apply to affected area once 30 g 3   ondansetron (ZOFRAN) 8 MG tablet Take 1 tablet (8 mg total) by mouth every 8 (eight) hours as needed for nausea or vomiting. Start on the third day after cisplatin. 30 tablet 1   PARoxetine (PAXIL CR) 12.5 MG 24 hr tablet Take 1 tablet (12.5 mg total) by mouth daily. 30 tablet 0   prochlorperazine (COMPAZINE)  10 MG tablet Take 1 tablet (10 mg total) by mouth every 6 (six) hours as needed (Nausea or vomiting). 30 tablet 1   Vitamin D, Ergocalciferol, (DRISDOL) 1.25 MG (50000 UNIT) CAPS capsule Take 1 capsule (50,000 Units total) by mouth every 7 (seven) days. Take for 8 total doses(weeks) 8 capsule 0   No current facility-administered medications for this visit.   Facility-Administered Medications Ordered in Other Visits  Medication Dose Route Frequency Provider  Last Rate Last Admin   0.9 %  sodium chloride infusion   Intravenous Continuous Brecklynn Jian, MD 10 mL/hr at 10/05/23 0839 New Bag at 10/05/23 0839   CISplatin (PLATINOL) 79 mg in sodium chloride 0.9 % 250 mL chemo infusion  40 mg/m2 (Treatment Plan Recorded) Intravenous Once Bryant Saye, MD       dexamethasone (DECADRON) injection 10 mg  10 mg Intravenous Once Walid Haig, MD       fosaprepitant (EMEND) 150 mg in sodium chloride 0.9 % 145 mL IVPB  150 mg Intravenous Once Veronika Heard, MD       heparin lock flush 100 unit/mL  500 Units Intracatheter Once PRN Jarone Ostergaard, MD       methylPREDNISolone sodium succinate (SOLU-MEDROL) 125 mg/2 mL injection 125 mg  125 mg Intravenous Once Nikolaus Pienta, MD       palonosetron (ALOXI) injection 0.25 mg  0.25 mg Intravenous Once Pennye Beeghly, MD       sodium chloride flush (NS) 0.9 % injection 10 mL  10 mL Intracatheter PRN Butler Vegh, MD        HISTORY OF PRESENT ILLNESS:   Oncology History  Malignant neoplasm of tonsillar fossa (HCC)  07/20/2023 Imaging   CT neck soft tissue: 2.5 x 3 cm mass in the left tonsillar region with extension towards the tongue base, suspicious for carcinoma, and evidence of level 2 lymphadenopathy on the left, consisting of a dominant node measuring 3 x 2 x 1.7 cm. A benign appearing osteoma was also demonstrated, projecting inferior from the angle of the left mandible.    08/26/2023 Initial Diagnosis   Malignant neoplasm of tonsillar fossa [facility]):  He presented to his PCP for routine follow-up in July 2024 and was incidentally noted to have left submandibular swelling on a routine examination performed at that time. A soft tissue head and neck ultrasound was subsequently performed on 07/07/23 which showed an ovoid mass measuring 2.8 x 1.9 cm, most consistent with an abnormally enlarged lymph node, and correlating with the palpable area in the left submandibular region.    A soft tissue neck CT was also  performed on 07/20/23 which demonstrated: a  2.5 x 3 cm mass in the left tonsillar region with extension towards the tongue base, suspicious for carcinoma, and evidence of level 2 lymphadenopathy on the left, consisting of a dominant node measuring 3 x 2 x 1.7 cm. A benign appearing osteoma was also demonstrated, projecting inferior from the angle of the left mandible.   Subsequently, the patient was referred to Dr. Roma Schanz at [facility] and underwent biopsies of the left tonsil on 08/26/23. 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    08/26/2023 Pathology Results   Patient was referred to Dr. Roma Schanz at [facility] and underwent biopsies of the left tonsil on 08/26/23. 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    09/07/2023 PET scan   PET scan performed on 09/07/23 at [facility]: intense  radiotracer uptake within the left palatine tonsillar mass; adjacent radiotracer uptake within the region of the right palatine tonsil; and hypermetabolic nodes in the left neck extending to the supraclavicular region concerning for nodal metastases. Focal uptake at the anorectal junction was also demonstrated, possibly reflecting muscular contraction, however local malignancy can not be excluded. PET otherwise showed no evidence of more distant metastatic disease.    09/14/2023 Cancer Staging   Staging form: Pharynx - HPV-Mediated Oropharynx, AJCC 8th Edition - Clinical stage from 09/14/2023: Stage I (cT2, cN1, cM0, p16+) - Signed by [name], MD on 09/14/2023 Stage prefix: Initial diagnosis   09/29/2023 -  Chemotherapy   Patient is on Treatment Plan : HEAD/NECK Cisplatin (40) q7d         REVIEW OF SYSTEMS:   Review of Systems - Oncology  All other pertinent systems were reviewed with the patient and are negative.   VITALS:   There were no vitals taken for this visit.  Wt  Readings from Last 3 Encounters:  10/05/23 176 lb 1.3 oz (79.9 kg)  10/04/23 176 lb (79.8 kg)  09/22/23 175 lb 14.8 oz (79.8 kg)    There is no height or weight on file to calculate BMI.  Performance status (ECOG): 0 - Asymptomatic  PHYSICAL EXAM:   Physical Exam Constitutional:      General: He is not in acute distress.    Appearance: Normal appearance.  HENT:     Head: Normocephalic and atraumatic.  Eyes:     General: No scleral icterus.    Extraocular Movements: Extraocular movements intact.     Conjunctiva/sclera: Conjunctivae normal.  Cardiovascular:     Rate and Rhythm: Normal rate and regular rhythm.     Pulses: Normal pulses.     Heart sounds: Normal heart sounds.  Pulmonary:     Effort: Pulmonary effort is normal.     Breath sounds: Normal breath sounds.  Chest:     Comments: Right-sided Port-A-Cath in place without any signs of infection Abdominal:     General: There is no distension.  Musculoskeletal:     Right lower leg: No edema.     Left lower leg: No edema.  Lymphadenopathy:     Cervical: No cervical adenopathy.  Skin:    Findings: No rash.  Neurological:     General: No focal deficit present.     Mental Status: He is alert and oriented to person, place, and time.  Psychiatric:        Mood and Affect: Mood normal.        Behavior: Behavior normal.        Thought Content: Thought content normal.        Judgment: Judgment normal.      LABORATORY DATA:   I have reviewed the data as listed.   Results for orders placed or performed in visit on 10/04/23 (from the past 72 hour(s))  CBC with Differential (Cancer Center Only)     Status: None   Collection Time: 10/04/23  1:50 PM  Result Value Ref Range   WBC Count 5.7 4.0 - 10.5 K/uL   RBC 4.84 4.22 - 5.81 MIL/uL   Hemoglobin 14.7 13.0 - 17.0 g/dL   HCT 44.0 34.7 - 42.5 %   MCV 86.4 80.0 - 100.0 fL   MCH 30.4 26.0 - 34.0 pg   MCHC 35.2 30.0 - 36.0 g/dL   RDW 95.6 38.7 - 56.4 %   Platelet Count  189 150 - 400 K/uL  nRBC 0.0 0.0 - 0.2 %   Neutrophils Relative % 71 %   Neutro Abs 4.1 1.7 - 7.7 K/uL   Lymphocytes Relative 15 %   Lymphs Abs 0.9 0.7 - 4.0 K/uL   Monocytes Relative 12 %   Monocytes Absolute 0.7 0.1 - 1.0 K/uL   Eosinophils Relative 1 %   Eosinophils Absolute 0.1 0.0 - 0.5 K/uL   Basophils Relative 0 %   Basophils Absolute 0.0 0.0 - 0.1 K/uL   Immature Granulocytes 1 %   Abs Immature Granulocytes 0.04 0.00 - 0.07 K/uL    Comment: Performed at [facility], [address], [place], [place] [address]  Basic Metabolic Panel - Cancer Center Only     Status: None   Collection Time: 10/04/23  1:50 PM  Result Value Ref Range   Sodium 135 135 - 145 mmol/L   Potassium 3.9 3.5 - 5.1 mmol/L   Chloride 102 98 - 111 mmol/L   CO2 28 22 - 32 mmol/L   Glucose, Bld 79 70 - 99 mg/dL    Comment: Glucose reference range applies only to samples taken after fasting for at least 8 hours.   BUN 14 6 - 20 mg/dL   Creatinine 6.04 5.40 - 1.24 mg/dL   Calcium 8.9 8.9 - 98.1 mg/dL   GFR, Estimated >19 >14 mL/min    Comment: (NOTE) Calculated using the CKD-EPI Creatinine Equation (2021)    Anion gap 5 5 - 15    Comment: Performed at [facility], [address], [place], [place] [address]  Magnesium     Status: None   Collection Time: 10/04/23  1:50 PM  Result Value Ref Range   Magnesium 2.0 1.7 - 2.4 mg/dL    Comment: Performed at [facility], [address] [place], [place] [address]     RADIOGRAPHIC STUDIES:  I have personally reviewed the radiological images as listed and agree with the findings in the report.  IR Gastrostomy Tube  Result Date: 09/22/2023 INDICATION: Chemo/radiation for head and neck cancer; placement of g-tube EXAM: Procedures: 1. FLUOROSCOPIC NASOGASTRIC TUBE PLACEMENT 2. PERCUTANEOUS GASTROSTOMY FEEDING TUBE PLACEMENT COMPARISON:  PET-CT, 09/07/2023. MEDICATIONS: Ancef 2 gm IV;  Antibiotics were administered within 1 hour of the procedure. 1.0 mg Glucagon IV. CONTRAST:  20 mL of Isovue 300 administered into the gastric lumen. ANESTHESIA/SEDATION: Moderate (conscious) sedation was employed during this procedure. A total of Versed 2 mg and Fentanyl 100 mcg was administered intravenously. Moderate Sedation Time: 15 minutes. The patient's level of consciousness and vital signs were monitored continuously by radiology nursing throughout the procedure under my direct supervision. FLUOROSCOPY TIME:  Fluoroscopic dose; 4 mGy COMPLICATIONS: None immediate. PROCEDURE: Informed written consent was obtained from the patient and/or patient's representative following explanation of the procedure, risks, benefits and alternatives. A time out was performed prior to the initiation of the procedure. Maximal barrier sterile technique utilized including caps, mask, sterile gowns, sterile gloves, large sterile drape, hand hygiene and sterile prep. The LEFT upper quadrant was sterilely prepped and draped. A oral gastric catheter was inserted into the stomach under fluoroscopy. The LEFT costal margin and barium opacified transverse colon were identified and avoided. Air was injected into the stomach for insufflation and visualization under fluoroscopy. Under sterile conditions and local anesthesia, 2 T tacks were utilized to pexy the anterior aspect of the stomach against the ventral abdominal wall. Contrast injection confirmed appropriate positioning of each of the T  tacks. An incision was made between the T tacks and a 17 gauge trocar needle was utilized to access the stomach. Needle position was confirmed within the stomach with aspiration of air and injection of a small amount of contrast. A stiff guidewire was advanced into the gastric lumen and under intermittent fluoroscopic guidance, the access needle was exchanged for a telescoping peel-away sheath, ultimately allowing placement of a 16 Fr balloon  retention gastrostomy tube. The retention balloon was insufflated with a mixture of dilute saline and contrast and pulled taut against the anterior wall of the stomach. The external disc was cinched. Contrast injection confirms positioning within the stomach. Several spot radiographic images were obtained in various obliquities for documentation. The patient tolerated procedure well without immediate post procedural complication. FINDINGS: After successful fluoroscopic guided placement, the gastrostomy tube is appropriately positioned with internal retention balloon against the ventral aspect of the gastric lumen. IMPRESSION: Successful percutaneous placement of a 16 Fr balloon retention gastrostomy tube. The gastrostomy may be used immediately for medication administration and in 4 hrs for the initiation of feeds. RECOMMENDATIONS: The patient will return to Vascular Interventional Radiology (VIR) for routine feeding tube evaluation and exchange in 6 months. Roanna Banning, MD [facility] Radiology Electronically Signed   By: Roanna Banning M.D.   On: 09/22/2023 10:44   IR Naso G Tube Plc W/FL W/Rad  Result Date: 09/22/2023 INDICATION: Chemo/radiation for head and neck cancer; placement of g-tube EXAM: Procedures: 1. FLUOROSCOPIC NASOGASTRIC TUBE PLACEMENT 2. PERCUTANEOUS GASTROSTOMY FEEDING TUBE PLACEMENT COMPARISON:  PET-CT, 09/07/2023. MEDICATIONS: Ancef 2 gm IV; Antibiotics were administered within 1 hour of the procedure. 1.0 mg Glucagon IV. CONTRAST:  20 mL of Isovue 300 administered into the gastric lumen. ANESTHESIA/SEDATION: Moderate (conscious) sedation was employed during this procedure. A total of Versed 2 mg and Fentanyl 100 mcg was administered intravenously. Moderate Sedation Time: 15 minutes. The patient's level of consciousness and vital signs were monitored continuously by radiology nursing throughout the procedure under my direct supervision.  FLUOROSCOPY TIME:  Fluoroscopic dose; 4 mGy COMPLICATIONS: None immediate. PROCEDURE: Informed written consent was obtained from the patient and/or patient's representative following explanation of the procedure, risks, benefits and alternatives. A time out was performed prior to the initiation of the procedure. Maximal barrier sterile technique utilized including caps, mask, sterile gowns, sterile gloves, large sterile drape, hand hygiene and sterile prep. The LEFT upper quadrant was sterilely prepped and draped. A oral gastric catheter was inserted into the stomach under fluoroscopy. The LEFT costal margin and barium opacified transverse colon were identified and avoided. Air was injected into the stomach for insufflation and visualization under fluoroscopy. Under sterile conditions and local anesthesia, 2 T tacks were utilized to pexy the anterior aspect of the stomach against the ventral abdominal wall. Contrast injection confirmed appropriate positioning of each of the T tacks. An incision was made between the T tacks and a 17 gauge trocar needle was utilized to access the stomach. Needle position was confirmed within the stomach with aspiration of air and injection of a small amount of contrast. A stiff guidewire was advanced into the gastric lumen and under intermittent fluoroscopic guidance, the access needle was exchanged for a telescoping peel-away sheath, ultimately allowing placement of a 16 Fr balloon retention gastrostomy tube. The retention balloon was insufflated with a mixture of dilute saline and contrast and pulled taut against the anterior wall of the stomach. The external disc was cinched. Contrast injection confirms positioning within the  stomach. Several spot radiographic images were obtained in various obliquities for documentation. The patient tolerated procedure well without immediate post procedural complication. FINDINGS: After successful fluoroscopic guided placement, the gastrostomy  tube is appropriately positioned with internal retention balloon against the ventral aspect of the gastric lumen. IMPRESSION: Successful percutaneous placement of a 16 Fr balloon retention gastrostomy tube. The gastrostomy may be used immediately for medication administration and in 4 hrs for the initiation of feeds. RECOMMENDATIONS: The patient will return to Vascular Interventional Radiology (VIR) for routine feeding tube evaluation and exchange in 6 months. Roanna Banning, MD [facility] Radiology Electronically Signed   By: Roanna Banning M.D.   On: 09/22/2023 10:44   IR IMAGING GUIDED PORT INSERTION  Result Date: 09/22/2023 INDICATION: Chemotherapy for head and neck cancer EXAM: IMPLANTED PORT A CATH PLACEMENT WITH ULTRASOUND AND FLUOROSCOPIC GUIDANCE MEDICATIONS: None ANESTHESIA/SEDATION: Moderate (conscious) sedation was employed during this procedure. A total of Versed 2 mg and Fentanyl 100 mcg was administered intravenously. Moderate Sedation Time: 20 minutes. The patient's level of consciousness and vital signs were monitored continuously by radiology nursing throughout the procedure under my direct supervision. FLUOROSCOPY TIME:  Fluoroscopic dose; 0 mGy COMPLICATIONS: None immediate. PROCEDURE: The procedure, risks, benefits, and alternatives were explained to the patient. Questions regarding the procedure were encouraged and answered. The patient understands and consents to the procedure. The RIGHT neck and chest were prepped with chlorhexidine in a sterile fashion, and a sterile drape was applied covering the operative field. Maximum barrier sterile technique with sterile gowns and gloves were used for the procedure. A timeout was performed prior to the initiation of the procedure. Local anesthesia was provided with 1% lidocaine with epinephrine. After creating a small venotomy incision, a micropuncture kit was utilized to access the internal jugular  vein under direct, real-time ultrasound guidance. Ultrasound image documentation was performed. The microwire was kinked to measure appropriate catheter length. A subcutaneous port pocket was then created along the upper chest wall utilizing a combination of sharp and blunt dissection. The pocket was irrigated with sterile saline. A single lumen Non-ISP power injectable port was chosen for placement. The 8 Fr catheter was tunneled from the port pocket site to the venotomy incision. The port was placed in the pocket. The external catheter was trimmed to appropriate length. At the venotomy, an 8 Fr peel-away sheath was placed over a guidewire under fluoroscopic guidance. The catheter was then placed through the sheath and the sheath was removed. Final catheter positioning was confirmed and documented with a fluoroscopic spot radiograph. The port was accessed with a Huber needle, aspirated and flushed with heparinized saline. The port pocket incision was closed with interrupted 3-0 Vicryl suture then Dermabond was applied, including at the venotomy incision. Dressings were placed. The patient tolerated the procedure well without immediate post procedural complication. IMPRESSION: Successful placement of a RIGHT internal jugular approach power injectable Port-A-Cath. The tip of the catheter is positioned within the proximal RIGHT atrium. The catheter is ready for immediate use. Roanna Banning, MD [facility] Radiology Electronically Signed   By: Roanna Banning M.D.   On: 09/22/2023 10:29    CODE STATUS:  Code Status History     Date Active Date Inactive Code Status Order ID Comments User Context   09/22/2023 1045 09/23/2023 0514 Full Code [id number]  Roanna Banning, MD [facility]   09/22/2023 1045 09/22/2023 1045 Full Code [id number]  Roanna Banning, MD [facility]    Questions  for Most Recent Historical Code Status (Order [id number])     Question Answer   By: Consent: discussion  documented in EHR            No orders of the defined types were placed in this encounter.    Future Appointments  Date Time Provider Department Center  10/05/2023  3:15 PM Noreene Larsson, RD [facility] None  10/05/2023  3:45 PM [facility] LINAC 4 [facility] None  10/06/2023  1:15 PM [facility] LINAC 4 [facility] None  10/07/2023  4:00 PM [facility] [id number] [facility] None  10/10/2023  1:00 PM Bowman, Mary A, LCSW [facility] None  10/10/2023  4:00 PM [facility] [id number] [facility] None  10/11/2023  2:45 PM [facility] LINAC 4 [facility] None  10/12/2023 11:00 AM [facility] FLUSH [facility] None  10/12/2023 11:40 AM Byrne Capek, MD [facility] None  10/12/2023  4:00 PM [facility] LINAC 4 [facility] None  10/13/2023  4:00 PM [facility] LINAC 4 [facility] None  10/14/2023  8:45 AM [facility] INFUSION [facility] None  10/14/2023  4:00 PM [facility] LINAC 4 [facility] None  10/17/2023  4:00 PM [facility] LINAC 4 [facility] None  10/18/2023  4:00 PM [facility] LINAC 4 [facility] None  10/19/2023  4:00 PM [facility] LINAC 4 [facility] None  10/20/2023  3:15 PM [facility] FLUSH [facility] None  10/20/2023  3:40 PM Dianey Suchy, MD [facility] None  10/20/2023  4:00 PM [facility] LINAC 4 [facility] None  10/21/2023  8:30 AM [facility] INFUSION [facility] None  10/21/2023  4:00 PM [facility] LINAC 4 [facility] None  10/24/2023 11:00 AM Bowman, Mary A, LCSW [facility] None  10/24/2023  4:00 PM [facility] LINAC 4 [facility] None  10/25/2023  4:00 PM [facility] LINAC 4 [facility] None  10/26/2023  8:15 AM [facility] FLUSH [facility] None  10/26/2023  9:00 AM Jahnyla Parrillo, MD [facility] None  10/26/2023 10:00 AM [facility] INFUSION [facility] None  10/26/2023  1:00 PM [facility] LINAC 4 [facility] None  10/31/2023  4:00 PM [facility] LINAC 4 [facility] None  11/01/2023  2:00 PM Thresa Ross, MD [facility] None  11/01/2023  4:00 PM  [facility] LINAC 4 [facility] None  11/02/2023  4:00 PM [facility] LINAC 4 [facility] None  11/03/2023  8:00 AM [facility] FLUSH [facility] None  11/03/2023  9:15 AM [facility] INFUSION [facility] None  11/03/2023 11:00 AM Cadon Raczka, MD [facility] None  11/03/2023  4:00 PM [facility] LINAC 4 [facility] None  11/04/2023  4:00 PM [facility] LINAC 4 [facility] None  11/07/2023  1:00 PM Bowman, Mary A, LCSW [facility] None  11/07/2023  4:00 PM [facility] LINAC 4 [facility] None  11/08/2023  4:00 PM [facility] LINAC 4 [facility] None  11/09/2023  4:00 PM [facility] LINAC 4 [facility] None  11/10/2023  4:00 PM [facility] LINAC 4 [facility] None  11/11/2023  4:00 PM [facility] LINAC 4 [facility] None  11/14/2023  4:00 PM [facility] LINAC 4 [facility] None  11/15/2023  4:00 PM [facility] LINAC 4 [facility] None  11/16/2023  4:00 PM [facility] LINAC 4 [facility] None  06/20/2024  8:40 AM Ashley Royalty, [name], MD [facility]      This document was completed utilizing speech recognition software. Grammatical errors, random word insertions, pronoun errors, and incomplete sentences are an occasional consequence of this system due to software limitations, ambient noise, and hardware issues. Any formal questions or concerns about the content, text or information contained within the body of this dictation should be directly addressed to the provider for clarification.

## 2023-10-05 NOTE — Telephone Encounter (Signed)
[facility] Clinical Social Work  Clinical Social Work was referred by medical provider for assessment of psychosocial needs.  Clinical Social Worker contacted patient by phone to offer support and assess for needs.  Appointment scheduled for 11/08     Marguerita Merles, LCSW A Clinical Social Worker [facility]

## 2023-10-05 NOTE — Assessment & Plan Note (Signed)
-  On the same day that he received cisplatin, he developed visual changes, described as a "3D" effect and double vision. The patient reports that the letters appear to be coming off each other and slightly offset, with the symptom being constant since onset. The patient has a history of good vision, with no need for corrective lenses in the past.  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  Though it is rare to happen with just 1 dose of cisplatin, given his symptoms, we cannot rule this out.  An ophthalmology referral has been placed already.  We will hold further cisplatin. -We started him on Solu-Medrol 125 mg 1 dose today and prednisone 1 mg/kg daily orally from tomorrow with plan to taper over the next 3 to 4 weeks.

## 2023-10-05 NOTE — Progress Notes (Signed)
Contacted [facility] to follow up on referral placed 11/4 by RN. Office staff informed me that they are not on call this month and the referral should be sent to the on call provider Dr. Vanessa Barbara at [facility] Referral was ordered and fax sent.

## 2023-10-05 NOTE — Assessment & Plan Note (Signed)
Potential for exacerbation with high-dose steroids. -Continue Omeprazole. -Advise over-the-counter Pepcid or Maalox as needed for heartburn.

## 2023-10-05 NOTE — Assessment & Plan Note (Signed)
-  Underwent total colectomy in 2016. Currently experiencing watery bowel movements approximately seven times a day. -Plan to refer to a genetic counselor after completion of tonsillar cancer treatments.

## 2023-10-05 NOTE — Assessment & Plan Note (Signed)
Improved with over-the-counter medications and Baclofen. -Continue current management.

## 2023-10-05 NOTE — Assessment & Plan Note (Addendum)
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  -Previously discussed the benefits and risks of radiation alone vs combination of chemotherapy and radiation. Given the patient's age and the size of the lymph node which is borderline at 3 cm, the benefits of adding chemotherapy slightly outweigh the risks. -Discussed side effect profile with cisplatin including, but not limited to, nausea, vomiting, electrolyte disturbances, renal dysfunction, tinnitus, hearing loss, neuropathy, cytopenias etc.  After discussing side effect profile, patient did opt for weekly cisplatin.  Plan made to proceed with weekly cisplatin at 40 mg/m dose during the course of radiation with appropriate pre and post hydration and IV nausea medications. -He started radiation treatments from 09/27/2023.  Started first dose of cisplatin on 09/29/2023. -On the same day that he received cisplatin, he developed visual changes, described as a "3D" effect and double vision. The patient reports that the letters appear to be coming off each other and slightly offset, with the symptom being constant since onset. The patient has a history of good vision, with no need for corrective lenses in the past.  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  Though it is rare to happen with just 1 dose of cisplatin, given his symptoms, we cannot rule this out.  An ophthalmology referral has been placed already.  We will hold further cisplatin. -We started him on Solu-Medrol 125 mg 1 dose today and prednisone 1 mg/kg daily orally from tomorrow with plan to taper over the next 3 to 4 weeks. -He was advised to continue radiation treatments alone for now.  Patient verbalized understanding and he is in agreement with the plan.

## 2023-10-06 ENCOUNTER — Encounter: Payer: Self-pay | Admitting: Oncology

## 2023-10-06 ENCOUNTER — Other Ambulatory Visit: Payer: Self-pay | Admitting: Oncology

## 2023-10-06 ENCOUNTER — Encounter (INDEPENDENT_AMBULATORY_CARE_PROVIDER_SITE_OTHER): Payer: Self-pay | Admitting: Ophthalmology

## 2023-10-06 ENCOUNTER — Ambulatory Visit
Admission: RE | Admit: 2023-10-06 | Discharge: 2023-10-06 | Disposition: A | Discharge status: Home / Self Care | Payer: BC Managed Care – PPO | Source: Ambulatory Visit | Attending: Radiation Oncology | Admitting: Radiation Oncology

## 2023-10-06 ENCOUNTER — Ambulatory Visit (INDEPENDENT_AMBULATORY_CARE_PROVIDER_SITE_OTHER): Payer: BC Managed Care – PPO | Admitting: Ophthalmology

## 2023-10-06 ENCOUNTER — Other Ambulatory Visit: Payer: Self-pay

## 2023-10-06 DIAGNOSIS — H31002 Unspecified chorioretinal scars, left eye: Secondary | ICD-10-CM | POA: Diagnosis not present

## 2023-10-06 DIAGNOSIS — H543 Unqualified visual loss, both eyes: Secondary | ICD-10-CM | POA: Diagnosis not present

## 2023-10-06 DIAGNOSIS — H3581 Retinal edema: Secondary | ICD-10-CM

## 2023-10-06 DIAGNOSIS — Z5111 Encounter for antineoplastic chemotherapy: Secondary | ICD-10-CM | POA: Diagnosis not present

## 2023-10-06 LAB — RAD ONC ARIA SESSION SUMMARY
Course Elapsed Days: 9
Plan Fractions Treated to Date: 8
Plan Prescribed Dose Per Fraction: 2 Gy
Plan Total Fractions Prescribed: 35
Plan Total Prescribed Dose: 70 Gy
Reference Point Dosage Given to Date: 16 Gy
Reference Point Session Dosage Given: 2 Gy
Session Number: 8

## 2023-10-06 NOTE — Progress Notes (Signed)
Dr. Vanessa Barbara with ophthalmology evaluated patient on 10/06/2023.  No signs of optic neuritis..  Patient was cleared to resume cisplatin.
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
## 2023-10-07 ENCOUNTER — Telehealth: Payer: Self-pay | Admitting: Dietician

## 2023-10-07 ENCOUNTER — Inpatient Hospital Stay: Payer: BC Managed Care – PPO

## 2023-10-07 ENCOUNTER — Ambulatory Visit
Admission: RE | Admit: 2023-10-07 | Discharge: 2023-10-07 | Disposition: A | Discharge status: Home / Self Care | Payer: BC Managed Care – PPO | Source: Ambulatory Visit | Attending: Radiation Oncology | Admitting: Radiation Oncology

## 2023-10-07 ENCOUNTER — Other Ambulatory Visit: Payer: Self-pay

## 2023-10-07 ENCOUNTER — Encounter (INDEPENDENT_AMBULATORY_CARE_PROVIDER_SITE_OTHER): Payer: BC Managed Care – PPO | Admitting: Ophthalmology

## 2023-10-07 DIAGNOSIS — Z5111 Encounter for antineoplastic chemotherapy: Secondary | ICD-10-CM | POA: Diagnosis not present

## 2023-10-07 LAB — RAD ONC ARIA SESSION SUMMARY
Course Elapsed Days: 10
Plan Fractions Treated to Date: 9
Plan Prescribed Dose Per Fraction: 2 Gy
Plan Total Fractions Prescribed: 35
Plan Total Prescribed Dose: 70 Gy
Reference Point Dosage Given to Date: 18 Gy
Reference Point Session Dosage Given: 2 Gy
Session Number: 9

## 2023-10-07 NOTE — Progress Notes (Signed)
[facility] Clinical Social Work  Clinical Social Work was referred by medical provider for assessment of psychosocial needs.  Clinical Social Worker met with patient and spouse to offer support and assess for needs.  Patient reported adjusting to diagnosis well and minimal side effects of treatment at this time. Patient and patient's spouse discussed impact of treatment on family member. At this time no needs identified. Patient and spouse has card with direct information if needed.   Marguerita Merles, LCSWA  Clinical Social Worker [facility]

## 2023-10-07 NOTE — Telephone Encounter (Signed)
Called Patient and spoke with wife Lenise Arena), wife scheduled appointment for nutrition.Marland KitchenMarland Kitchen

## 2023-10-08 ENCOUNTER — Telehealth: Payer: Self-pay | Admitting: Oncology

## 2023-10-08 NOTE — Telephone Encounter (Signed)
Scheduled per provider orders, informed patient about 11/14 and 11/15 appointments. Patient is notified of future upcoming appointments as well and will speak to his spouse about all upcoming appointments.

## 2023-10-10 ENCOUNTER — Encounter: Payer: Self-pay | Admitting: Oncology

## 2023-10-10 ENCOUNTER — Other Ambulatory Visit: Payer: Self-pay

## 2023-10-10 ENCOUNTER — Ambulatory Visit (HOSPITAL_COMMUNITY): Payer: BC Managed Care – PPO | Admitting: Licensed Clinical Social Worker

## 2023-10-10 ENCOUNTER — Encounter (HOSPITAL_COMMUNITY): Payer: Self-pay

## 2023-10-10 ENCOUNTER — Ambulatory Visit
Admission: RE | Admit: 2023-10-10 | Discharge: 2023-10-10 | Disposition: A | Discharge status: Home / Self Care | Payer: BC Managed Care – PPO | Source: Ambulatory Visit | Attending: Radiation Oncology | Admitting: Radiation Oncology

## 2023-10-10 ENCOUNTER — Other Ambulatory Visit: Payer: Self-pay | Admitting: Radiation Oncology

## 2023-10-10 DIAGNOSIS — C090 Malignant neoplasm of tonsillar fossa: Secondary | ICD-10-CM

## 2023-10-10 DIAGNOSIS — Z5111 Encounter for antineoplastic chemotherapy: Secondary | ICD-10-CM | POA: Diagnosis not present

## 2023-10-10 LAB — RAD ONC ARIA SESSION SUMMARY
Course Elapsed Days: 13
Plan Fractions Treated to Date: 10
Plan Prescribed Dose Per Fraction: 2 Gy
Plan Total Fractions Prescribed: 35
Plan Total Prescribed Dose: 70 Gy
Reference Point Dosage Given to Date: 20 Gy
Reference Point Session Dosage Given: 2 Gy
Session Number: 10

## 2023-10-10 MED ORDER — LIDOCAINE VISCOUS HCL 2 % MT SOLN: OROMUCOSAL | 3 refills | Status: DC

## 2023-10-11 ENCOUNTER — Ambulatory Visit
Admission: RE | Admit: 2023-10-11 | Discharge: 2023-10-11 | Disposition: A | Discharge status: Home / Self Care | Payer: BC Managed Care – PPO | Source: Ambulatory Visit | Attending: Radiation Oncology | Admitting: Radiation Oncology

## 2023-10-11 ENCOUNTER — Other Ambulatory Visit: Payer: Self-pay

## 2023-10-11 ENCOUNTER — Other Ambulatory Visit: Payer: Self-pay | Admitting: Radiation Oncology

## 2023-10-11 ENCOUNTER — Encounter: Payer: Self-pay | Admitting: Oncology

## 2023-10-11 DIAGNOSIS — Z5111 Encounter for antineoplastic chemotherapy: Secondary | ICD-10-CM | POA: Diagnosis not present

## 2023-10-11 DIAGNOSIS — C090 Malignant neoplasm of tonsillar fossa: Secondary | ICD-10-CM

## 2023-10-11 LAB — RAD ONC ARIA SESSION SUMMARY
Course Elapsed Days: 14
Plan Fractions Treated to Date: 11
Plan Prescribed Dose Per Fraction: 2 Gy
Plan Total Fractions Prescribed: 35
Plan Total Prescribed Dose: 70 Gy
Reference Point Dosage Given to Date: 22 Gy
Reference Point Session Dosage Given: 2 Gy
Session Number: 11

## 2023-10-11 MED ORDER — SUCRALFATE 1 G PO TABS: ORAL_TABLET | ORAL | 4 refills | Status: DC

## 2023-10-12 ENCOUNTER — Other Ambulatory Visit: Payer: Self-pay

## 2023-10-12 ENCOUNTER — Other Ambulatory Visit: Payer: BC Managed Care – PPO

## 2023-10-12 ENCOUNTER — Inpatient Hospital Stay: Payer: Self-pay

## 2023-10-12 ENCOUNTER — Ambulatory Visit
Admission: RE | Admit: 2023-10-12 | Discharge: 2023-10-12 | Disposition: A | Discharge status: Home / Self Care | Payer: BC Managed Care – PPO | Source: Ambulatory Visit | Attending: Radiation Oncology

## 2023-10-12 ENCOUNTER — Ambulatory Visit: Payer: BC Managed Care – PPO | Admitting: Oncology

## 2023-10-12 ENCOUNTER — Encounter: Payer: Self-pay | Admitting: Oncology

## 2023-10-12 ENCOUNTER — Inpatient Hospital Stay (HOSPITAL_BASED_OUTPATIENT_CLINIC_OR_DEPARTMENT_OTHER): Payer: Self-pay | Admitting: Oncology

## 2023-10-12 VITALS — BP 124/81 | HR 66 | Temp 97.9°F | Resp 17 | Wt 179.5 lb

## 2023-10-12 DIAGNOSIS — C090 Malignant neoplasm of tonsillar fossa: Secondary | ICD-10-CM

## 2023-10-12 DIAGNOSIS — R066 Hiccough: Secondary | ICD-10-CM

## 2023-10-12 DIAGNOSIS — Z5111 Encounter for antineoplastic chemotherapy: Secondary | ICD-10-CM | POA: Diagnosis not present

## 2023-10-12 DIAGNOSIS — Z95828 Presence of other vascular implants and grafts: Secondary | ICD-10-CM

## 2023-10-12 DIAGNOSIS — D1391 Familial adenomatous polyposis: Secondary | ICD-10-CM

## 2023-10-12 DIAGNOSIS — H539 Unspecified visual disturbance: Secondary | ICD-10-CM | POA: Diagnosis not present

## 2023-10-12 LAB — CBC WITH DIFFERENTIAL (CANCER CENTER ONLY)
Abs Immature Granulocytes: 0.03 10*3/uL (ref 0.00–0.07)
Basophils Absolute: 0 10*3/uL (ref 0.0–0.1)
Basophils Relative: 0 %
Eosinophils Absolute: 0 10*3/uL (ref 0.0–0.5)
Eosinophils Relative: 0 %
HCT: 38.6 % — ABNORMAL LOW (ref 39.0–52.0)
Hemoglobin: 13.3 g/dL (ref 13.0–17.0)
Immature Granulocytes: 1 %
Lymphocytes Relative: 8 %
Lymphs Abs: 0.5 10*3/uL — ABNORMAL LOW (ref 0.7–4.0)
MCH: 30.5 pg (ref 26.0–34.0)
MCHC: 34.5 g/dL (ref 30.0–36.0)
MCV: 88.5 fL (ref 80.0–100.0)
Monocytes Absolute: 0.7 10*3/uL (ref 0.1–1.0)
Monocytes Relative: 11 %
Neutro Abs: 4.9 10*3/uL (ref 1.7–7.7)
Neutrophils Relative %: 80 %
Platelet Count: 190 10*3/uL (ref 150–400)
RBC: 4.36 MIL/uL (ref 4.22–5.81)
RDW: 12.8 % (ref 11.5–15.5)
WBC Count: 6.2 10*3/uL (ref 4.0–10.5)
nRBC: 0 % (ref 0.0–0.2)

## 2023-10-12 LAB — RAD ONC ARIA SESSION SUMMARY
Course Elapsed Days: 15
Plan Fractions Treated to Date: 12
Plan Prescribed Dose Per Fraction: 2 Gy
Plan Total Fractions Prescribed: 35
Plan Total Prescribed Dose: 70 Gy
Reference Point Dosage Given to Date: 24 Gy
Reference Point Session Dosage Given: 2 Gy
Session Number: 12

## 2023-10-12 LAB — BASIC METABOLIC PANEL - CANCER CENTER ONLY
Anion gap: 3 — ABNORMAL LOW (ref 5–15)
BUN: 12 mg/dL (ref 6–20)
CO2: 31 mmol/L (ref 22–32)
Calcium: 9 mg/dL (ref 8.9–10.3)
Chloride: 103 mmol/L (ref 98–111)
Creatinine: 0.94 mg/dL (ref 0.61–1.24)
GFR, Estimated: >60 mL/min (ref >60)
Glucose, Bld: 94 mg/dL (ref 70–99)
Potassium: 3.8 mmol/L (ref 3.5–5.1)
Sodium: 137 mmol/L (ref 135–145)

## 2023-10-12 LAB — MAGNESIUM: Magnesium: 2.1 mg/dL (ref 1.7–2.4)

## 2023-10-12 MED ORDER — HEPARIN SOD (PORK) LOCK FLUSH 100 UNIT/ML IV SOLN
500.0000 [IU] | Freq: Once | INTRAVENOUS | Status: AC
  Administered 2023-10-12: 500 [IU]

## 2023-10-12 MED ORDER — SODIUM CHLORIDE 0.9% FLUSH
10.0000 mL | Freq: Once | INTRAVENOUS | Status: AC
  Administered 2023-10-12: 10 mL

## 2023-10-12 NOTE — Assessment & Plan Note (Signed)
 Improved with over-the-counter medications and Baclofen. -Continue current management.

## 2023-10-12 NOTE — Assessment & Plan Note (Addendum)
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  -Previously discussed the benefits and risks of radiation alone vs combination of chemotherapy and radiation. Given the patient's age and the size of the lymph node which is borderline at 3 cm, the benefits of adding chemotherapy slightly outweigh the risks. - After discussing side effect profile, patient did opt for weekly cisplatin.  Plan made to proceed with weekly cisplatin at 40 mg/m dose during the course of radiation with appropriate pre and post hydration and IV nausea medications. -He started radiation treatments from 09/27/2023.  Started first dose of cisplatin on 09/29/2023. -On the same day that he received first dose of cisplatin, he developed visual changes, described as a "3D" effect and double vision. The patient reports that the letters appear to be coming off each other and slightly offset, with the symptom being constant since onset. The patient has a history of good vision, with no need for corrective lenses in the past.  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  Though it is rare to happen with just 1 dose of cisplatin, given his symptoms, an ophthalmology referral was placed and we held Cisplatin on 10/05/23. -We started him on prednisone 1 mg/kg daily orally with plan to taper over the next 3 to 4 weeks. -Dr. Vanessa Barbara with ophthalmology evaluated patient on 10/06/2023.  No signs of optic neuritis..  Patient was cleared to resume cisplatin. -Discussed ophthalmology recommendations and plan to resume cisplatin.  Patient was agreeable.  Labs reviewed.  No dose-limiting toxicities.  Will resume cisplatin from 10/14/2023 and continue this weekly during the course of radiation.

## 2023-10-12 NOTE — Progress Notes (Signed)
Oncology Nurse Navigator Documentation   I spoke with Harry Williams about getting Jamaine scheduled for head and neck [facility] on 11/21 in the morning. I explained that his afternoon appointments would need to be moved to a morning appointment for him to see SLP and PT here at the cancer center. She voiced that they are unable to change and would prefer to see SLP and PT at their outpatient offices for evaluation. I have sent a message to their schedulers to let them know to set up appointments at the outpatient offices due to the request from Mrs. Scorza.  Hedda Slade RN, BSN, OCN Head & Neck Oncology Nurse Navigator [facility] at [facility] Phone # [phone]  Fax # [phone]

## 2023-10-12 NOTE — Assessment & Plan Note (Signed)
-  Underwent total colectomy in 2016. Currently experiencing watery bowel movements approximately seven times a day. -Plan to refer to a genetic counselor after completion of tonsillar cancer treatments.

## 2023-10-12 NOTE — Assessment & Plan Note (Addendum)
-  On the same day that he received first dose of cisplatin, he developed visual changes, described as a "3D" effect and double vision. The patient reports that the letters appear to be coming off each other and slightly offset, with the symptom being constant since onset. The patient has a history of good vision, with no need for corrective lenses in the past.  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 Though it is rare to happen with just 1 dose of cisplatin, given his symptoms, an ophthalmology referral was placed and we held Cisplatin on 10/05/23. -We started him on prednisone 1 mg/kg daily orally with plan to taper over the next 3 to 4 weeks. -Dr. Vanessa Barbara with ophthalmology evaluated patient on 10/06/2023.  No signs of optic neuritis..  Patient was cleared to resume cisplatin. -We will quickly taper off the prednisone and stop within the next week.

## 2023-10-12 NOTE — Progress Notes (Signed)
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  -Previously discussed the benefits and risks of radiation alone vs combination of chemotherapy and radiation. Given the patient's age and the size of the lymph node which is borderline at 3 cm, the benefits of adding chemotherapy slightly outweigh the risks. - After discussing side effect profile, patient did opt for weekly cisplatin.  Plan made to proceed with weekly cisplatin at 40 mg/m dose during the course of radiation with appropriate pre and post hydration and IV nausea medications. -He started radiation treatments from 09/27/2023.  Started first dose of cisplatin on 09/29/2023. -On the same day that he received first dose of cisplatin, he developed visual changes, described as a "3D" effect and double vision. The patient reports that the letters appear to be coming off each other and slightly offset, with the symptom being constant since onset. The patient has a history of good vision, with no need for corrective lenses in the past.  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  Though it is rare to happen with just 1 dose of cisplatin, given his symptoms, an ophthalmology referral was placed and we held Cisplatin on 10/05/23. -We started him on prednisone 1 mg/kg daily orally with plan to taper over  the next 3 to 4 weeks. -Dr. Vanessa Barbara with ophthalmology evaluated patient on 10/06/2023.  No signs of optic neuritis..  Patient was cleared to resume cisplatin. -Discussed ophthalmology recommendations and plan to resume cisplatin.  Patient was agreeable.  Labs reviewed.  No dose-limiting toxicities.  Will resume cisplatin from 10/14/2023 and continue this weekly during the course of radiation.  Visual disturbances -On the same day that he received first dose of cisplatin, he developed visual changes, described as a "3D" effect and double vision. The patient reports that the letters appear to be coming off each other and slightly offset, with the symptom being constant since onset. The patient has a history of good vision, with no need for corrective lenses in the past.  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 Though it is rare to happen with just 1 dose of cisplatin, given his symptoms, an ophthalmology referral was placed and we held Cisplatin on 10/05/23. -We started him on prednisone 1 mg/kg daily orally with plan to taper over the next 3 to 4 weeks. -Dr. Vanessa Barbara with ophthalmology evaluated patient on 10/06/2023.  No signs of optic neuritis..  Patient was cleared to resume cisplatin. -We will quickly taper off the prednisone and stop within the next week.  Hiccups Improved with over-the-counter medications and Baclofen. -Continue current management.  Familial adenomatous polyposis -Underwent total colectomy in 2016. Currently experiencing watery bowel movements approximately seven times a day. -Plan to refer to a genetic counselor after completion of tonsillar cancer treatments.    I reviewed lab results and outside records for this  visit and discussed relevant results with the patient. Diagnosis, plan of care and treatment options were also discussed in detail with the  patient. Opportunity provided to ask questions and answers provided to his apparent satisfaction. Provided instructions to call our clinic with any problems, questions or concerns prior to return visit. I recommended to continue follow-up with PCP and sub-specialists. He verbalized understanding and agreed with the plan.   NCCN guidelines have been consulted in the planning of this patient's care.  I provided 30 minutes of face-to-face time during this encounter and > 50% was spent counseling as documented under my assessment and plan.    [name], MD  10/12/2023 12:23 PM  [facility] [address] [place] [address] Dept: [phone] Dept Fax: [phone]    CHIEF COMPLAINT/ REASON FOR VISIT:   Recently diagnosed squamous of carcinoma of left tonsillar fossa, p16 positive.  Current Treatment: Concurrent chemoradiation with weekly cisplatin, chemo started from 09/29/2023  INTERVAL HISTORY:    Discussed the use of AI scribe software for clinical note transcription with the patient, who gave verbal consent to proceed.   Harry Williams is here today for repeat clinical assessment. He denies fevers or chills. He denies pain. His appetite is good. His weight has been stable.  He had ophthalmology evaluation on 10/06/2023.  No gross abnormalities were noted.  He does report improvement in vision.  The patient also reports soreness in the back of the throat but is still able to swallow. He denies nausea and vomiting. He has a feeding tube in place, which is being kept flushed and is used for hydration. The patient denies any ringing in the ears or tingling and numbness in the hands and feet.  The patient is also on prednisone, which was started recently. He is currently taking three tablets a day.  I have reviewed the past medical history, past surgical history, social history and family history with the patient and they are unchanged  from previous note.  ALLERGIES: He has No Known Allergies.  MEDICATIONS:  Current Outpatient Medications  Medication Sig Dispense Refill   baclofen (LIORESAL) 10 MG tablet Take 1 tablet (10 mg total) by mouth 3 (three) times daily. 30 each 0   celecoxib (CELEBREX) 200 MG capsule Take 200 mg by mouth daily as needed for moderate pain (pain score 4-6).     dexamethasone (DECADRON) 4 MG tablet Take 2 tablets (8 mg) by mouth daily x 3 days starting the day after cisplatin chemotherapy. Take with food. 30 tablet 1   HYDROcodone-acetaminophen (NORCO) 5-325 MG tablet Take 1-2 tablets by mouth every 6 (six) hours as needed for moderate pain (pain score 4-6). 15 tablet 0   lidocaine (XYLOCAINE) 2 % solution Patient: Mix 1part 2% viscous lidocaine, 1part H20. Swish & swallow 10mL of diluted mixture, before meals and at bedtime, up to QID 200 mL 3   lidocaine-prilocaine (EMLA) cream Apply to affected area once 30 g 3   ondansetron (ZOFRAN) 8 MG tablet Take 1 tablet (8 mg total) by mouth every 8 (eight) hours as needed for nausea or vomiting. Start on the third day after cisplatin. 30 tablet 1   PARoxetine (PAXIL CR) 12.5 MG 24 hr tablet Take 1 tablet (12.5 mg total) by mouth daily. 30 tablet 0   predniSONE (DELTASONE) 20 MG tablet Take 4 tablets by mouth once daily (80 mg dose) for 7 days, followed by 3 tablets by mouth once daily,  continue until further instructions 60 tablet 1   prochlorperazine (COMPAZINE) 10 MG tablet Take 1 tablet (10 mg total) by mouth every 6 (six) hours as needed (Nausea or vomiting). 30 tablet 1   sucralfate (CARAFATE) 1 g tablet Dissolve 1 tablet in 10 mL H20 and swallow 30 min prior to meals and bedtime. 40 tablet 4   Vitamin D, Ergocalciferol, (DRISDOL) 1.25 MG (50000 UNIT) CAPS capsule Take 1 capsule (50,000 Units total) by mouth every 7 (seven) days. Take for 8 total doses(weeks) 8 capsule 0   No current facility-administered medications for this visit.    HISTORY OF  PRESENT ILLNESS:   Oncology History  Malignant neoplasm of tonsillar fossa (HCC)  07/20/2023 Imaging   CT neck soft tissue: 2.5 x 3 cm mass in the left tonsillar region with extension towards the tongue base, suspicious for carcinoma, and evidence of level 2 lymphadenopathy on the left, consisting of a dominant node measuring 3 x 2 x 1.7 cm. A benign appearing osteoma was also demonstrated, projecting inferior from the angle of the left mandible.    08/26/2023 Initial Diagnosis   Malignant neoplasm of tonsillar fossa [facility]):  He presented to his PCP for routine follow-up in July 2024 and was incidentally noted to have left submandibular swelling on a routine examination performed at that time. A soft tissue head and neck ultrasound was subsequently performed on 07/07/23 which showed an ovoid mass measuring 2.8 x 1.9 cm, most consistent with an abnormally enlarged lymph node, and correlating with the palpable area in the left submandibular region.    A soft tissue neck CT was also performed on 07/20/23 which demonstrated: a  2.5 x 3 cm mass in the left tonsillar region with extension towards the tongue base, suspicious for carcinoma, and evidence of level 2 lymphadenopathy on the left, consisting of a dominant node measuring 3 x 2 x 1.7 cm. A benign appearing osteoma was also demonstrated, projecting inferior from the angle of the left mandible.   Subsequently, the patient was referred to Dr. Roma Schanz at [facility] and underwent biopsies of the left tonsil on 08/26/23. 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    08/26/2023 Pathology Results   Patient was referred to Dr. Roma Schanz at [facility] and underwent biopsies of the left tonsil on 08/26/23. 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    09/07/2023 PET scan   PET scan performed on 09/07/23 at [facility]: intense radiotracer  uptake within the left palatine tonsillar mass; adjacent radiotracer uptake within the region of the right palatine tonsil; and hypermetabolic nodes in the left neck extending to the supraclavicular region concerning for nodal metastases. Focal uptake at the anorectal junction was also demonstrated, possibly reflecting muscular contraction, however local malignancy can not be excluded. PET otherwise showed no evidence of more distant metastatic disease.    09/14/2023 Cancer Staging   Staging form: Pharynx - HPV-Mediated Oropharynx, AJCC 8th Edition - Clinical stage from 09/14/2023: Stage I (cT2, cN1, cM0, p16+) - Signed by [name], MD on 09/14/2023 Stage prefix: Initial diagnosis   09/29/2023 -  Chemotherapy   Patient is on Treatment Plan : HEAD/NECK Cisplatin (40) q7d         REVIEW OF SYSTEMS:   Review of Systems - Oncology  All other pertinent systems were reviewed with the patient and are negative.   VITALS:   Blood pressure 124/81, pulse 66, temperature 97.9 F (  36.6 C), temperature source Temporal, resp. rate 17, weight 179 lb 8 oz (81.4 kg), SpO2 100%.  Wt Readings from Last 3 Encounters:  10/12/23 179 lb 8 oz (81.4 kg)  10/05/23 176 lb 1.3 oz (79.9 kg)  10/04/23 176 lb (79.8 kg)    Body mass index is 27.29 kg/m.  Performance status (ECOG): 0 - Asymptomatic  PHYSICAL EXAM:   Physical Exam Constitutional:      General: He is not in acute distress.    Appearance: Normal appearance.  HENT:     Head: Normocephalic and atraumatic.  Eyes:     General: No scleral icterus.    Extraocular Movements: Extraocular movements intact.     Conjunctiva/sclera: Conjunctivae normal.  Cardiovascular:     Rate and Rhythm: Normal rate and regular rhythm.     Pulses: Normal pulses.     Heart sounds: Normal heart sounds.  Pulmonary:     Effort: Pulmonary effort is normal.     Breath sounds: Normal breath sounds.  Chest:     Comments: Right-sided Port-A-Cath in place without  any signs of infection Abdominal:     General: There is no distension.  Musculoskeletal:     Right lower leg: No edema.     Left lower leg: No edema.  Lymphadenopathy:     Cervical: No cervical adenopathy.  Skin:    Findings: No rash.  Neurological:     General: No focal deficit present.     Mental Status: He is alert and oriented to person, place, and time.  Psychiatric:        Mood and Affect: Mood normal.        Behavior: Behavior normal.        Thought Content: Thought content normal.        Judgment: Judgment normal.      LABORATORY DATA:   I have reviewed the data as listed.   Results for orders placed or performed in visit on 10/12/23 (from the past 72 hour(s))  CBC with Differential (Cancer Center Only)     Status: Abnormal   Collection Time: 10/12/23 10:42 AM  Result Value Ref Range   WBC Count 6.2 4.0 - 10.5 K/uL   RBC 4.36 4.22 - 5.81 MIL/uL   Hemoglobin 13.3 13.0 - 17.0 g/dL   HCT 91.4 (L) 78.2 - 95.6 %   MCV 88.5 80.0 - 100.0 fL   MCH 30.5 26.0 - 34.0 pg   MCHC 34.5 30.0 - 36.0 g/dL   RDW 21.3 08.6 - 57.8 %   Platelet Count 190 150 - 400 K/uL   nRBC 0.0 0.0 - 0.2 %   Neutrophils Relative % 80 %   Neutro Abs 4.9 1.7 - 7.7 K/uL   Lymphocytes Relative 8 %   Lymphs Abs 0.5 (L) 0.7 - 4.0 K/uL   Monocytes Relative 11 %   Monocytes Absolute 0.7 0.1 - 1.0 K/uL   Eosinophils Relative 0 %   Eosinophils Absolute 0.0 0.0 - 0.5 K/uL   Basophils Relative 0 %   Basophils Absolute 0.0 0.0 - 0.1 K/uL   Immature Granulocytes 1 %   Abs Immature Granulocytes 0.03 0.00 - 0.07 K/uL    Comment: Performed at [facility], [address], [place], [place] [address]  Basic Metabolic Panel - Cancer Center Only     Status: Abnormal   Collection Time: 10/12/23 10:42 AM  Result Value Ref Range   Sodium 137 135 - 145 mmol/L   Potassium  3.8 3.5 - 5.1 mmol/L   Chloride 103 98 - 111 mmol/L   CO2 31 22 - 32 mmol/L   Glucose, Bld 94 70 - 99 mg/dL     Comment: Glucose reference range applies only to samples taken after fasting for at least 8 hours.   BUN 12 6 - 20 mg/dL   Creatinine 2.13 0.86 - 1.24 mg/dL   Calcium 9.0 8.9 - 57.8 mg/dL   GFR, Estimated >46 >96 mL/min    Comment: (NOTE) Calculated using the CKD-EPI Creatinine Equation (2021)    Anion gap 3 (L) 5 - 15    Comment: Performed at [facility], [address], [place], [place] [address]  Magnesium     Status: None   Collection Time: 10/12/23 10:42 AM  Result Value Ref Range   Magnesium 2.1 1.7 - 2.4 mg/dL    Comment: Performed at [facility], [address], [place], [place] [address]     RADIOGRAPHIC STUDIES:  I have personally reviewed the radiological images as listed and agree with the findings in the report.  OCT, Retina - OU - Both Eyes  Result Date: 10/06/2023 Right Eye Quality was good. Central Foveal Thickness: 246. Progression has no prior data. Findings include normal foveal contour, no IRF, no SRF, vitreomacular adhesion . Left Eye Central Foveal Thickness: 271. Progression has no prior data. Findings include normal foveal contour, no IRF, no SRF, vitreomacular adhesion . 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 CME - cystoid macular edema. PED - pigment epithelial detachment. IRF - intraretinal fluid. SRF - subretinal fluid. EZ - ellipsoid zone. ERM - epiretinal membrane. ORA - outer retinal atrophy. ORT - outer retinal tubulation. SRHM - subretinal hyper-reflective material. IRHM - intraretinal hyper-reflective material   IR Gastrostomy Tube  Result Date: 09/22/2023 INDICATION: Chemo/radiation for head and neck cancer; placement of g-tube EXAM: Procedures: 1. FLUOROSCOPIC NASOGASTRIC TUBE PLACEMENT 2. PERCUTANEOUS GASTROSTOMY FEEDING TUBE PLACEMENT COMPARISON:  PET-CT, 09/07/2023. MEDICATIONS:  Ancef 2 gm IV; Antibiotics were administered within 1 hour of the procedure. 1.0 mg Glucagon IV. CONTRAST:  20 mL of Isovue 300 administered into the gastric lumen. ANESTHESIA/SEDATION: Moderate (conscious) sedation was employed during this procedure. A total of Versed 2 mg and Fentanyl 100 mcg was administered intravenously. Moderate Sedation Time: 15 minutes. The patient's level of consciousness and vital signs were monitored continuously by radiology nursing throughout the procedure under my direct supervision. FLUOROSCOPY TIME:  Fluoroscopic dose; 4 mGy COMPLICATIONS: None immediate. PROCEDURE: Informed written consent was obtained from the patient and/or patient's representative following explanation of the procedure, risks, benefits and alternatives. A time out was performed prior to the initiation of the procedure. Maximal barrier sterile technique utilized including caps, mask, sterile gowns, sterile gloves, large sterile drape, hand hygiene and sterile prep. The LEFT upper quadrant was sterilely prepped and draped. A oral gastric catheter was inserted into the stomach under fluoroscopy. The LEFT costal margin and barium opacified transverse colon were identified and avoided. Air was injected into the stomach for insufflation and visualization under fluoroscopy. Under sterile conditions and local anesthesia, 2 T tacks were utilized to pexy the anterior aspect of the stomach against the ventral abdominal wall. Contrast injection confirmed appropriate positioning of each of the T tacks. An incision was made between the T tacks and a 17 gauge trocar needle was utilized to access the stomach.  Needle position was confirmed within the stomach with aspiration of air and injection of a small amount of contrast. A stiff guidewire was advanced into the gastric lumen and under intermittent fluoroscopic guidance, the access needle was exchanged for a telescoping peel-away sheath, ultimately allowing placement of a 16 Fr  balloon retention gastrostomy tube. The retention balloon was insufflated with a mixture of dilute saline and contrast and pulled taut against the anterior wall of the stomach. The external disc was cinched. Contrast injection confirms positioning within the stomach. Several spot radiographic images were obtained in various obliquities for documentation. The patient tolerated procedure well without immediate post procedural complication. FINDINGS: After successful fluoroscopic guided placement, the gastrostomy tube is appropriately positioned with internal retention balloon against the ventral aspect of the gastric lumen. IMPRESSION: Successful percutaneous placement of a 16 Fr balloon retention gastrostomy tube. The gastrostomy may be used immediately for medication administration and in 4 hrs for the initiation of feeds. RECOMMENDATIONS: The patient will return to Vascular Interventional Radiology (VIR) for routine feeding tube evaluation and exchange in 6 months. Roanna Banning, MD [facility] Radiology Electronically Signed   By: Roanna Banning M.D.   On: 09/22/2023 10:44   IR Naso G Tube Plc W/FL W/Rad  Result Date: 09/22/2023 INDICATION: Chemo/radiation for head and neck cancer; placement of g-tube EXAM: Procedures: 1. FLUOROSCOPIC NASOGASTRIC TUBE PLACEMENT 2. PERCUTANEOUS GASTROSTOMY FEEDING TUBE PLACEMENT COMPARISON:  PET-CT, 09/07/2023. MEDICATIONS: Ancef 2 gm IV; Antibiotics were administered within 1 hour of the procedure. 1.0 mg Glucagon IV. CONTRAST:  20 mL of Isovue 300 administered into the gastric lumen. ANESTHESIA/SEDATION: Moderate (conscious) sedation was employed during this procedure. A total of Versed 2 mg and Fentanyl 100 mcg was administered intravenously. Moderate Sedation Time: 15 minutes. The patient's level of consciousness and vital signs were monitored continuously by radiology nursing throughout the procedure under my direct  supervision. FLUOROSCOPY TIME:  Fluoroscopic dose; 4 mGy COMPLICATIONS: None immediate. PROCEDURE: Informed written consent was obtained from the patient and/or patient's representative following explanation of the procedure, risks, benefits and alternatives. A time out was performed prior to the initiation of the procedure. Maximal barrier sterile technique utilized including caps, mask, sterile gowns, sterile gloves, large sterile drape, hand hygiene and sterile prep. The LEFT upper quadrant was sterilely prepped and draped. A oral gastric catheter was inserted into the stomach under fluoroscopy. The LEFT costal margin and barium opacified transverse colon were identified and avoided. Air was injected into the stomach for insufflation and visualization under fluoroscopy. Under sterile conditions and local anesthesia, 2 T tacks were utilized to pexy the anterior aspect of the stomach against the ventral abdominal wall. Contrast injection confirmed appropriate positioning of each of the T tacks. An incision was made between the T tacks and a 17 gauge trocar needle was utilized to access the stomach. Needle position was confirmed within the stomach with aspiration of air and injection of a small amount of contrast. A stiff guidewire was advanced into the gastric lumen and under intermittent fluoroscopic guidance, the access needle was exchanged for a telescoping peel-away sheath, ultimately allowing placement of a 16 Fr balloon retention gastrostomy tube. The retention balloon was insufflated with a mixture of dilute saline and contrast and pulled taut against the anterior wall of the stomach. The external disc was cinched. Contrast injection confirms positioning within the stomach. Several spot radiographic images were obtained in various obliquities for documentation. The patient tolerated procedure well without immediate post procedural  complication. FINDINGS: After successful fluoroscopic guided placement, the  gastrostomy tube is appropriately positioned with internal retention balloon against the ventral aspect of the gastric lumen. IMPRESSION: Successful percutaneous placement of a 16 Fr balloon retention gastrostomy tube. The gastrostomy may be used immediately for medication administration and in 4 hrs for the initiation of feeds. RECOMMENDATIONS: The patient will return to Vascular Interventional Radiology (VIR) for routine feeding tube evaluation and exchange in 6 months. Roanna Banning, MD [facility] Radiology Electronically Signed   By: Roanna Banning M.D.   On: 09/22/2023 10:44   IR IMAGING GUIDED PORT INSERTION  Result Date: 09/22/2023 INDICATION: Chemotherapy for head and neck cancer EXAM: IMPLANTED PORT A CATH PLACEMENT WITH ULTRASOUND AND FLUOROSCOPIC GUIDANCE MEDICATIONS: None ANESTHESIA/SEDATION: Moderate (conscious) sedation was employed during this procedure. A total of Versed 2 mg and Fentanyl 100 mcg was administered intravenously. Moderate Sedation Time: 20 minutes. The patient's level of consciousness and vital signs were monitored continuously by radiology nursing throughout the procedure under my direct supervision. FLUOROSCOPY TIME:  Fluoroscopic dose; 0 mGy COMPLICATIONS: None immediate. PROCEDURE: The procedure, risks, benefits, and alternatives were explained to the patient. Questions regarding the procedure were encouraged and answered. The patient understands and consents to the procedure. The RIGHT neck and chest were prepped with chlorhexidine in a sterile fashion, and a sterile drape was applied covering the operative field. Maximum barrier sterile technique with sterile gowns and gloves were used for the procedure. A timeout was performed prior to the initiation of the procedure. Local anesthesia was provided with 1% lidocaine with epinephrine. After creating a small venotomy incision, a micropuncture kit was utilized to access the internal  jugular vein under direct, real-time ultrasound guidance. Ultrasound image documentation was performed. The microwire was kinked to measure appropriate catheter length. A subcutaneous port pocket was then created along the upper chest wall utilizing a combination of sharp and blunt dissection. The pocket was irrigated with sterile saline. A single lumen Non-ISP power injectable port was chosen for placement. The 8 Fr catheter was tunneled from the port pocket site to the venotomy incision. The port was placed in the pocket. The external catheter was trimmed to appropriate length. At the venotomy, an 8 Fr peel-away sheath was placed over a guidewire under fluoroscopic guidance. The catheter was then placed through the sheath and the sheath was removed. Final catheter positioning was confirmed and documented with a fluoroscopic spot radiograph. The port was accessed with a Huber needle, aspirated and flushed with heparinized saline. The port pocket incision was closed with interrupted 3-0 Vicryl suture then Dermabond was applied, including at the venotomy incision. Dressings were placed. The patient tolerated the procedure well without immediate post procedural complication. IMPRESSION: Successful placement of a RIGHT internal jugular approach power injectable Port-A-Cath. The tip of the catheter is positioned within the proximal RIGHT atrium. The catheter is ready for immediate use. Roanna Banning, MD [facility] Radiology Electronically Signed   By: Roanna Banning M.D.   On: 09/22/2023 10:29    CODE STATUS:  Code Status History     Date Active Date Inactive Code Status Order ID Comments User Context   09/22/2023 1045 09/23/2023 0514 Full Code [id number]  Roanna Banning, MD [facility]   09/22/2023 1045 09/22/2023 1045 Full Code [id number]  Roanna Banning, MD [facility]    Questions for Most Recent Historical Code Status (Order [id number])     Question Answer   By: Consent:  discussion documented  in EHR             Future Appointments  Date Time Provider Department Center  10/12/2023 12:45 PM [facility] LINAC 4 [facility] None  10/13/2023  4:00 PM [facility] LINAC 4 [facility] None  10/14/2023  9:45 AM [facility] LINAC 4 [facility] None  10/14/2023 10:00 AM [facility] INFUSION [facility] None  10/17/2023  4:00 PM [facility] LINAC 4 [facility] None  10/18/2023 10:30 AM Noreene Larsson, RD [facility] None  10/18/2023  4:00 PM [facility] LINAC 4 [facility] None  10/19/2023  4:00 PM [facility] LINAC 4 [facility] None  10/20/2023  3:15 PM [facility] FLUSH [facility] None  10/20/2023  3:40 PM Kin Galbraith, MD [facility] None  10/20/2023  4:00 PM [facility] LINAC 4 [facility] None  10/21/2023  9:00 AM [facility] INFUSION [facility] None  10/21/2023  4:00 PM [facility] LINAC 4 [facility] None  10/24/2023 11:00 AM Bowman, Mary A, LCSW [facility] None  10/24/2023  4:00 PM [facility] LINAC 4 [facility] None  10/25/2023  4:00 PM [facility] LINAC 4 [facility] None  10/26/2023  8:15 AM [facility] FLUSH [facility] None  10/26/2023  9:00 AM Anida Deol, MD [facility] None  10/26/2023  1:00 PM [facility] LINAC 4 [facility] None  10/31/2023  4:00 PM [facility] LINAC 4 [facility] None  11/01/2023  2:00 PM Thresa Ross, MD [facility] None  11/01/2023  4:00 PM [facility] LINAC 4 [facility] None  11/02/2023  4:00 PM [facility] LINAC 4 [facility] None  11/03/2023  8:00 AM [facility] FLUSH [facility] None  11/03/2023  9:15 AM [facility] INFUSION [facility] None  11/03/2023 11:15 AM Neff, Barbara L, RD [facility] None  11/03/2023 11:20 AM Armetta Henri, [name], MD [facility] None  11/03/2023  4:00 PM [facility] LINAC 4 [facility] None  11/04/2023  4:00 PM [facility] LINAC 4 [facility] None  11/07/2023  1:00 PM Bowman, Mary A, LCSW [facility] None  11/07/2023  4:00 PM [facility] LINAC 4 [facility] None  11/08/2023  4:00 PM [facility]  LINAC 4 [facility] None  11/09/2023  4:00 PM [facility] LINAC 4 [facility] None  11/10/2023  4:00 PM [facility] LINAC 4 [facility] None  11/11/2023  4:00 PM [facility] LINAC 4 [facility] None  11/14/2023  4:00 PM [facility] LINAC 4 [facility] None  11/15/2023  4:00 PM [facility] LINAC 4 [facility] None  11/16/2023  4:00 PM [facility] LINAC 4 [facility] None  01/09/2024 10:00 AM Rennis Chris, MD [facility] None  06/20/2024  8:40 AM Ashley Royalty, [name], MD [facility]      This document was completed utilizing speech recognition software. Grammatical errors, random word insertions, pronoun errors, and incomplete sentences are an occasional consequence of this system due to software limitations, ambient noise, and hardware issues. Any formal questions or concerns about the content, text or information contained within the body of this dictation should be directly addressed to the provider for clarification.

## 2023-10-13 ENCOUNTER — Other Ambulatory Visit: Payer: Self-pay

## 2023-10-13 ENCOUNTER — Other Ambulatory Visit: Payer: BC Managed Care – PPO

## 2023-10-13 ENCOUNTER — Inpatient Hospital Stay: Payer: BC Managed Care – PPO | Admitting: Oncology

## 2023-10-13 ENCOUNTER — Ambulatory Visit
Admission: RE | Admit: 2023-10-13 | Discharge: 2023-10-13 | Disposition: A | Discharge status: Home / Self Care | Payer: BC Managed Care – PPO | Source: Ambulatory Visit | Attending: Radiation Oncology | Admitting: Radiation Oncology

## 2023-10-13 DIAGNOSIS — Z5111 Encounter for antineoplastic chemotherapy: Secondary | ICD-10-CM | POA: Diagnosis not present

## 2023-10-13 LAB — RAD ONC ARIA SESSION SUMMARY
Course Elapsed Days: 16
Plan Fractions Treated to Date: 13
Plan Prescribed Dose Per Fraction: 2 Gy
Plan Total Fractions Prescribed: 35
Plan Total Prescribed Dose: 70 Gy
Reference Point Dosage Given to Date: 26 Gy
Reference Point Session Dosage Given: 2 Gy
Session Number: 13

## 2023-10-13 MED FILL — Fosaprepitant Dimeglumine For IV Infusion 150 MG (Base Eq): INTRAVENOUS | Qty: 5 | Status: AC

## 2023-10-14 ENCOUNTER — Other Ambulatory Visit: Payer: Self-pay

## 2023-10-14 ENCOUNTER — Ambulatory Visit
Admission: RE | Admit: 2023-10-14 | Discharge: 2023-10-14 | Disposition: A | Discharge status: Home / Self Care | Payer: BC Managed Care – PPO | Source: Ambulatory Visit | Attending: Radiation Oncology

## 2023-10-14 ENCOUNTER — Inpatient Hospital Stay: Payer: BC Managed Care – PPO

## 2023-10-14 ENCOUNTER — Ambulatory Visit: Payer: BC Managed Care – PPO

## 2023-10-14 ENCOUNTER — Encounter: Payer: Self-pay | Admitting: Dietician

## 2023-10-14 VITALS — BP 118/79 | HR 70 | Temp 98.2°F | Resp 18

## 2023-10-14 DIAGNOSIS — C090 Malignant neoplasm of tonsillar fossa: Secondary | ICD-10-CM

## 2023-10-14 DIAGNOSIS — Z5111 Encounter for antineoplastic chemotherapy: Secondary | ICD-10-CM | POA: Diagnosis not present

## 2023-10-14 LAB — RAD ONC ARIA SESSION SUMMARY
Course Elapsed Days: 17
Plan Fractions Treated to Date: 14
Plan Prescribed Dose Per Fraction: 2 Gy
Plan Total Fractions Prescribed: 35
Plan Total Prescribed Dose: 70 Gy
Reference Point Dosage Given to Date: 28 Gy
Reference Point Session Dosage Given: 2 Gy
Session Number: 14

## 2023-10-14 MED ORDER — CISPLATIN CHEMO INJECTION 100MG/100ML
40.0000 mg/m2 | Freq: Once | INTRAVENOUS | Status: AC
  Administered 2023-10-14: 79 mg via INTRAVENOUS
  Filled 2023-10-14: qty 79

## 2023-10-14 MED ORDER — DEXAMETHASONE SODIUM PHOSPHATE 10 MG/ML IJ SOLN
10.0000 mg | Freq: Once | INTRAMUSCULAR | Status: AC
  Administered 2023-10-14: 10 mg via INTRAVENOUS
  Filled 2023-10-14: qty 1

## 2023-10-14 MED ORDER — SODIUM CHLORIDE 0.9 % IV SOLN: INTRAVENOUS | Status: DC

## 2023-10-14 MED ORDER — PALONOSETRON HCL INJECTION 0.25 MG/5ML
0.2500 mg | Freq: Once | INTRAVENOUS | Status: AC
  Administered 2023-10-14: 0.25 mg via INTRAVENOUS
  Filled 2023-10-14: qty 5

## 2023-10-14 MED ORDER — SODIUM CHLORIDE 0.9% FLUSH
10.0000 mL | INTRAVENOUS | Status: DC | PRN
Start: 2023-10-14 — End: 2023-10-14

## 2023-10-14 MED ORDER — HEPARIN SOD (PORK) LOCK FLUSH 100 UNIT/ML IV SOLN: 500.0000 [IU] | Freq: Once | INTRAVENOUS | Status: DC | PRN

## 2023-10-14 MED ORDER — SODIUM CHLORIDE 0.9 % IV SOLN
150.0000 mg | Freq: Once | INTRAVENOUS | Status: AC
  Administered 2023-10-14: 150 mg via INTRAVENOUS
  Filled 2023-10-14: qty 150

## 2023-10-14 MED ORDER — MAGNESIUM SULFATE 2 GM/50ML IV SOLN
2.0000 g | Freq: Once | INTRAVENOUS | Status: AC
Start: 2023-10-14 — End: 2023-10-14
  Administered 2023-10-14: 2 g via INTRAVENOUS
  Filled 2023-10-14: qty 50

## 2023-10-14 MED ORDER — POTASSIUM CHLORIDE IN NACL 20-0.9 MEQ/L-% IV SOLN
Freq: Once | INTRAVENOUS | Status: AC
  Filled 2023-10-14: qty 1000

## 2023-10-14 NOTE — Patient Instructions (Signed)
 [facility] [facility] [facility] - A DEPT OF [facility]   Discharge Instructions: Thank you for choosing [facility] to provide your oncology and hematology care.   If you have a lab appointment with the Cancer Center, please go directly to the Cancer Center and check in at the registration area.   Wear comfortable clothing and clothing appropriate for easy access to any Portacath or PICC line.   We strive to give you quality time with your provider. You may need to reschedule your appointment if you arrive late (15 or more minutes).  Arriving late affects you and other patients whose appointments are after yours.  Also, if you miss three or more appointments without notifying the office, you may be dismissed from the clinic at the provider's discretion.      For prescription refill requests, have your pharmacy contact our office and allow 72 hours for refills to be completed.    Today you received the following chemotherapy and/or immunotherapy agents: Cisplatin      To help prevent nausea and vomiting after your treatment, we encourage you to take your nausea medication as directed.  BELOW ARE SYMPTOMS THAT SHOULD BE REPORTED IMMEDIATELY: *FEVER GREATER THAN 100.4 F (38 C) OR HIGHER *CHILLS OR SWEATING *NAUSEA AND VOMITING THAT IS NOT CONTROLLED WITH YOUR NAUSEA MEDICATION *UNUSUAL SHORTNESS OF BREATH *UNUSUAL BRUISING OR BLEEDING *URINARY PROBLEMS (pain or burning when urinating, or frequent urination) *BOWEL PROBLEMS (unusual diarrhea, constipation, pain near the anus) TENDERNESS IN MOUTH AND THROAT WITH OR WITHOUT PRESENCE OF ULCERS (sore throat, sores in mouth, or a toothache) UNUSUAL RASH, SWELLING OR PAIN  UNUSUAL VAGINAL DISCHARGE OR ITCHING   Items with * indicate a potential emergency and should be followed up as soon as possible or go to the Emergency Department if any problems should occur.  Please show the CHEMOTHERAPY ALERT CARD or  IMMUNOTHERAPY ALERT CARD at check-in to the Emergency Department and triage nurse.  Should you have questions after your visit or need to cancel or reschedule your appointment, please contact [facility] - A DEPT OF [facility]  Dept: [phone]  and follow the prompts.  Office hours are 8:00 a.m. to 4:30 p.m. Monday - Friday. Please note that voicemails left after 4:00 p.m. may not be returned until the following business day.  We are closed weekends and major holidays. You have access to a nurse at all times for urgent questions. Please call the main number to the clinic Dept: [phone] and follow the prompts.   For any non-urgent questions, you may also contact your provider using MyChart. We now offer e-Visits for anyone 68 and older to request care online for non-urgent symptoms. For details visit [email or web address].   Also download the MyChart app! Go to the app store, search "MyChart", open the app, select [facility], and log in with your MyChart username and password.

## 2023-10-14 NOTE — Progress Notes (Signed)
100cc urine output. Dr. [name] notified. OK to proceed with treatment per Dr. [name] and will continue to monitor urine output. Encouraged pt to increase hydration at home

## 2023-10-17 ENCOUNTER — Ambulatory Visit: Payer: BC Managed Care – PPO

## 2023-10-17 ENCOUNTER — Ambulatory Visit
Admission: RE | Admit: 2023-10-17 | Discharge: 2023-10-17 | Disposition: A | Discharge status: Home / Self Care | Payer: BC Managed Care – PPO | Source: Ambulatory Visit | Attending: Radiation Oncology

## 2023-10-17 ENCOUNTER — Other Ambulatory Visit: Payer: Self-pay

## 2023-10-17 DIAGNOSIS — Z5111 Encounter for antineoplastic chemotherapy: Secondary | ICD-10-CM | POA: Diagnosis not present

## 2023-10-17 LAB — RAD ONC ARIA SESSION SUMMARY
Course Elapsed Days: 20
Plan Fractions Treated to Date: 15
Plan Prescribed Dose Per Fraction: 2 Gy
Plan Total Fractions Prescribed: 35
Plan Total Prescribed Dose: 70 Gy
Reference Point Dosage Given to Date: 30 Gy
Reference Point Session Dosage Given: 2 Gy
Session Number: 15

## 2023-10-18 ENCOUNTER — Ambulatory Visit
Admission: RE | Admit: 2023-10-18 | Discharge: 2023-10-18 | Disposition: A | Discharge status: Home / Self Care | Payer: BC Managed Care – PPO | Source: Ambulatory Visit | Attending: Radiation Oncology

## 2023-10-18 ENCOUNTER — Other Ambulatory Visit: Payer: Self-pay

## 2023-10-18 ENCOUNTER — Inpatient Hospital Stay: Payer: BC Managed Care – PPO | Admitting: Dietician

## 2023-10-18 DIAGNOSIS — Z5111 Encounter for antineoplastic chemotherapy: Secondary | ICD-10-CM | POA: Diagnosis not present

## 2023-10-18 LAB — RAD ONC ARIA SESSION SUMMARY
Course Elapsed Days: 21
Plan Fractions Treated to Date: 16
Plan Prescribed Dose Per Fraction: 2 Gy
Plan Total Fractions Prescribed: 35
Plan Total Prescribed Dose: 70 Gy
Reference Point Dosage Given to Date: 32 Gy
Reference Point Session Dosage Given: 2 Gy
Session Number: 16

## 2023-10-18 MED ORDER — OSMOLITE 1.5 CAL PO LIQD: ORAL | Status: DC

## 2023-10-18 NOTE — Progress Notes (Signed)
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 He is receiving concurrent chemoradiation with weekly cisplatin. Patient is under the care of Dr. [name] and Dr. Basilio Cairo (final RT planned 12/18).  S/p PEG 10/24 - no clamp  Past medical history includes GERD, cervical spondylosis  Met with patient and significant other in office. Patient reports increased pain in throat and odynophagia. His saliva is thick and reports altered taste of foods/drink. Patient tolerated small amounts of oral intake secondary to this. He has started supplementing with tube feedings. Significant other reports he is giving 2-3 cartons of Osmolite 1.5 with 60 ml water before and after. Patient is tolerating this well. He denies nausea, vomiting, diarrhea, constipation.   Nutrition Focused Physical Exam: deferred    Medications: baclofen, celebrex, decadron, norco, xylocaine, zofran, paxil, compazine, carafate, drisdol   Labs: reviewed    Anthropometrics: Weights have decreased 6% from 179.2 lb on 11/11 - this is severe from time frame  Height: 5'8" Weight: 169.6 lb (11/18 - aria) UBW: 180 lb (per pt) BMI: 25.8   Estimated Energy Needs  Kcals: 2450-2850 Protein: 122-139 Fluid: >/= 2.5L   NUTRITION DIAGNOSIS: Inadequate oral intake related to [facility] and associated treatment side effects as evidenced by dysphagia, odynophagia, taste changes, 6% wt loss in one week - severe for time frame   INTERVENTION:  Oral intake of liquids and smooth textured foods as tolerated  Patient agreeable to increase tube feedings by one carton/day to meet nutrition goals   Additional syringes provided Will contact [employer] for delivery of formula/supplies  Goal: expect long term enteral needs 7 cartons Osmolite 1.5 split over four feeding daily. Flush tube with 60 ml water before and after each bolus. Provide additional 3 1/2 cups (830 ml) water/day to meet hydration  needs. Provides 2485 kcal, 104.3 g protein, 1267 ml free water from formula (2577 ml total water with flushes). 1659 ml/day meets 100% RDI  MONITORING, EVALUATION, GOAL: Patient will tolerate increased calorie/protein energy intake via feeding tube to minimize further wt loss    Next Visit: Thursday December 5 during infusion with Britta Mccreedy

## 2023-10-19 ENCOUNTER — Encounter (HOSPITAL_COMMUNITY): Payer: Self-pay | Admitting: Radiology

## 2023-10-19 ENCOUNTER — Other Ambulatory Visit: Payer: Self-pay | Admitting: Oncology

## 2023-10-19 ENCOUNTER — Other Ambulatory Visit: Payer: Self-pay

## 2023-10-19 ENCOUNTER — Ambulatory Visit
Admission: RE | Admit: 2023-10-19 | Discharge: 2023-10-19 | Disposition: A | Discharge status: Home / Self Care | Payer: BC Managed Care – PPO | Source: Ambulatory Visit | Attending: Radiation Oncology | Admitting: Radiation Oncology

## 2023-10-19 DIAGNOSIS — Z5111 Encounter for antineoplastic chemotherapy: Secondary | ICD-10-CM | POA: Diagnosis not present

## 2023-10-19 DIAGNOSIS — C090 Malignant neoplasm of tonsillar fossa: Secondary | ICD-10-CM

## 2023-10-19 LAB — RAD ONC ARIA SESSION SUMMARY
Course Elapsed Days: 22
Plan Fractions Treated to Date: 17
Plan Prescribed Dose Per Fraction: 2 Gy
Plan Total Fractions Prescribed: 35
Plan Total Prescribed Dose: 70 Gy
Reference Point Dosage Given to Date: 34 Gy
Reference Point Session Dosage Given: 2 Gy
Session Number: 17

## 2023-10-20 ENCOUNTER — Ambulatory Visit
Admission: RE | Admit: 2023-10-20 | Discharge: 2023-10-20 | Disposition: A | Discharge status: Home / Self Care | Payer: BC Managed Care – PPO | Source: Ambulatory Visit | Attending: Radiation Oncology | Admitting: Radiation Oncology

## 2023-10-20 ENCOUNTER — Other Ambulatory Visit: Payer: Self-pay

## 2023-10-20 ENCOUNTER — Encounter: Payer: Self-pay | Admitting: Oncology

## 2023-10-20 ENCOUNTER — Inpatient Hospital Stay: Payer: BC Managed Care – PPO | Admitting: Oncology

## 2023-10-20 ENCOUNTER — Inpatient Hospital Stay: Payer: BC Managed Care – PPO

## 2023-10-20 VITALS — BP 109/67 | HR 78 | Temp 98.3°F | Resp 17 | Ht 68.0 in | Wt 172.0 lb

## 2023-10-20 DIAGNOSIS — Z5111 Encounter for antineoplastic chemotherapy: Secondary | ICD-10-CM | POA: Diagnosis not present

## 2023-10-20 DIAGNOSIS — C090 Malignant neoplasm of tonsillar fossa: Secondary | ICD-10-CM

## 2023-10-20 DIAGNOSIS — D1391 Familial adenomatous polyposis: Secondary | ICD-10-CM | POA: Diagnosis not present

## 2023-10-20 DIAGNOSIS — H539 Unspecified visual disturbance: Secondary | ICD-10-CM | POA: Diagnosis not present

## 2023-10-20 DIAGNOSIS — Z95828 Presence of other vascular implants and grafts: Secondary | ICD-10-CM

## 2023-10-20 LAB — RAD ONC ARIA SESSION SUMMARY
Course Elapsed Days: 23
Plan Fractions Treated to Date: 18
Plan Prescribed Dose Per Fraction: 2 Gy
Plan Total Fractions Prescribed: 35
Plan Total Prescribed Dose: 70 Gy
Reference Point Dosage Given to Date: 36 Gy
Reference Point Session Dosage Given: 2 Gy
Session Number: 18

## 2023-10-20 LAB — MAGNESIUM: Magnesium: 2.1 mg/dL (ref 1.7–2.4)

## 2023-10-20 LAB — BASIC METABOLIC PANEL - CANCER CENTER ONLY
Anion gap: 6 (ref 5–15)
BUN: 18 mg/dL (ref 6–20)
CO2: 28 mmol/L (ref 22–32)
Calcium: 9 mg/dL (ref 8.9–10.3)
Chloride: 99 mmol/L (ref 98–111)
Creatinine: 1.12 mg/dL (ref 0.61–1.24)
GFR, Estimated: >60 mL/min (ref >60)
Glucose, Bld: 86 mg/dL (ref 70–99)
Potassium: 4.6 mmol/L (ref 3.5–5.1)
Sodium: 133 mmol/L — ABNORMAL LOW (ref 135–145)

## 2023-10-20 LAB — CBC WITH DIFFERENTIAL (CANCER CENTER ONLY)
Abs Immature Granulocytes: 0.01 10*3/uL (ref 0.00–0.07)
Basophils Absolute: 0 10*3/uL (ref 0.0–0.1)
Basophils Relative: 0 %
Eosinophils Absolute: 0 10*3/uL (ref 0.0–0.5)
Eosinophils Relative: 0 %
HCT: 39.1 % (ref 39.0–52.0)
Hemoglobin: 13.6 g/dL (ref 13.0–17.0)
Immature Granulocytes: 0 %
Lymphocytes Relative: 3 %
Lymphs Abs: 0.1 10*3/uL — ABNORMAL LOW (ref 0.7–4.0)
MCH: 31.6 pg (ref 26.0–34.0)
MCHC: 34.8 g/dL (ref 30.0–36.0)
MCV: 90.7 fL (ref 80.0–100.0)
Monocytes Absolute: 0.3 10*3/uL (ref 0.1–1.0)
Monocytes Relative: 8 %
Neutro Abs: 3.6 10*3/uL (ref 1.7–7.7)
Neutrophils Relative %: 89 %
Platelet Count: 168 10*3/uL (ref 150–400)
RBC: 4.31 MIL/uL (ref 4.22–5.81)
RDW: 12.4 % (ref 11.5–15.5)
WBC Count: 4 10*3/uL (ref 4.0–10.5)
nRBC: 0 % (ref 0.0–0.2)

## 2023-10-20 MED ORDER — HEPARIN SOD (PORK) LOCK FLUSH 100 UNIT/ML IV SOLN
500.0000 [IU] | Freq: Once | INTRAVENOUS | Status: AC
  Administered 2023-10-20: 500 [IU]

## 2023-10-20 MED ORDER — SODIUM CHLORIDE 0.9% FLUSH
10.0000 mL | Freq: Once | INTRAVENOUS | Status: AC
  Administered 2023-10-20: 10 mL

## 2023-10-20 MED FILL — Fosaprepitant Dimeglumine For IV Infusion 150 MG (Base Eq): INTRAVENOUS | Qty: 5 | Status: AC

## 2023-10-20 NOTE — Assessment & Plan Note (Addendum)
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  - Previously discussed the benefits and risks of radiation alone vs combination of chemotherapy and radiation. Given the patient's age and the size of the lymph node which is borderline at 3 cm, the benefits of adding chemotherapy slightly outweigh the risks. - After discussing side effect profile, patient did opt for weekly cisplatin.  Plan made to proceed with weekly cisplatin at 40 mg/m dose during the course of radiation with appropriate pre and post hydration and IV nausea medications.  -He started radiation treatments from 09/27/2023.  Started first dose of cisplatin on 09/29/2023.  -On the same day that he received first dose of cisplatin, he developed visual changes, described as a "3D" effect and double vision. The patient reports that the letters appear to be coming off each other and slightly offset, with the symptom being constant since onset. The patient has a history of good vision, with no need for corrective lenses in the past.  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  Though it is rare to happen with just 1 dose of cisplatin, given his symptoms, an ophthalmology referral was placed and we held Cisplatin on 10/05/23. -Dr. Vanessa Barbara with ophthalmology evaluated patient on 10/06/2023.  No signs of optic neuritis. Patient was cleared to resume cisplatin.  - We resumed cisplatin from 10/14/2023 and continue this weekly during the course of radiation.  -Labs today reveal no dose-limiting toxicities.  Will proceed with cisplatin as planned tomorrow.

## 2023-10-20 NOTE — Progress Notes (Signed)
[name]  ONCOLOGY CLINIC PROGRESS NOTE   Patient Care Team: [name], MD as PCP - General (Family Medicine) Noe Gens, MD as Referring Physician (Otolaryngology) [name], MD as Attending Physician (Radiation Oncology) [name], [name], RN as Oncology Nurse Navigator [name], MD as Consulting Physician (Oncology)  Date of visit: 10/20/2023   ASSESSMENT & PLAN:   Very pleasant 42 y.o. 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  Currently receiving concurrent chemoradiation with weekly cisplatin starting from 09/29/2023.  Malignant neoplasm of tonsillar fossa (HCC) - Recently diagnosed left tonsillar squamous cell carcinoma with a 3 cm lymph node. cT2,cN1,cM0, p16+.  - Previously discussed the benefits and risks of radiation alone vs combination of chemotherapy and radiation. Given the patient's age and the size of the lymph node which is borderline at 3 cm, the benefits of adding chemotherapy slightly outweigh the risks. - After discussing side effect profile, patient did opt for weekly cisplatin.  Plan made to proceed with weekly cisplatin at 40 mg/m dose during the course of radiation with appropriate pre and post hydration and IV nausea medications.  -He started radiation treatments from 09/27/2023.  Started first dose of cisplatin on 09/29/2023.  -On the same day that he received first dose of cisplatin, he developed visual changes, described as a "3D" effect and double vision. The patient reports that the letters appear to be coming off each other and slightly offset, with the symptom being constant since onset. The patient has a history of good vision, with no need for corrective lenses in the past.  -Although  very rare, optic neuritis has been reported with cisplatin use on literature review, especially when used in combination with other chemotherapeutic agents.  Though it is rare to happen with just 1 dose of cisplatin, given his symptoms, an ophthalmology referral was placed and we held Cisplatin on 10/05/23. -Dr. Vanessa Barbara with ophthalmology evaluated patient on 10/06/2023.  No signs of optic neuritis. Patient was cleared to resume cisplatin.  - We resumed cisplatin from 10/14/2023 and continue this weekly during the course of radiation.  -Labs today reveal no dose-limiting toxicities.  Will proceed with cisplatin as planned tomorrow.  Visual disturbances -On the same day that he received first dose of cisplatin, he developed visual changes, described as a "3D" effect and double vision. The patient reports that the letters appear to be coming off each other and slightly offset, with the symptom being constant since onset. The patient has a history of good vision, with no need for corrective lenses in the past.  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 Though it is rare to happen with just 1 dose of cisplatin, given his symptoms, an ophthalmology referral was placed and we held Cisplatin on 10/05/23. -We started him on prednisone 1 mg/kg daily orally with plan to taper over the next 3 to 4 weeks. -Dr. Vanessa Barbara with ophthalmology evaluated patient on 10/06/2023.  No signs of optic neuritis..  Patient was cleared to resume cisplatin. -We quickly tapered off the prednisone and patient will cut down dose to 10 mg daily for the next 2 days and then stop.  Familial adenomatous polyposis -Underwent total colectomy in 2016. Currently  experiencing watery bowel movements approximately seven times a day. -Plan to refer to a genetic counselor after completion of tonsillar cancer treatments.    I  reviewed lab results and outside records for this visit and discussed relevant results with the patient. Diagnosis, plan of care and treatment options were also discussed in detail with the patient. Opportunity provided to ask questions and answers provided to his apparent satisfaction. Provided instructions to call our clinic with any problems, questions or concerns prior to return visit. I recommended to continue follow-up with PCP and sub-specialists. He verbalized understanding and agreed with the plan.   NCCN guidelines have been consulted in the planning of this patient's care.  I provided 30 minutes of face-to-face time during this encounter and > 50% was spent counseling as documented under my assessment and plan.    [name], MD  10/20/2023 4:17 PM  [facility] AT [facility] [address] [place] [address] Dept: [phone] Dept Fax: [phone]    CHIEF COMPLAINT/ REASON FOR VISIT:   Recently diagnosed squamous of carcinoma of left tonsillar fossa, p16 positive.  Current Treatment: Concurrent chemoradiation with weekly cisplatin, chemo started from 09/29/2023  INTERVAL HISTORY:    Discussed the use of AI scribe software for clinical note transcription with the patient, who gave verbal consent to proceed.   Harry Williams is here today for repeat clinical assessment. He denies fevers or chills.   He reports that the vision problems have resolved. He is currently tapering off prednisone, down to one 20mg  tablet per day.  The patient reports soreness in his throat, particularly when swallowing and doing mouth exercises. He is having difficulty eating by mouth, but is trying to maintain oral intake. He has been using a feeding tube regularly, mainly with Osmolite, and is trying to consume seven cartons a day to combat weight loss. He is also using baking soda and salt mouthwash, lidocaine, and circletate to manage oral discomfort.  He  has been experiencing some redness in the mouth, which is within the expected range at this point in his radiation treatment.  I have reviewed the past medical history, past surgical history, social history and family history with the patient and they are unchanged from previous note.  ALLERGIES: He has No Known Allergies.  MEDICATIONS:  Current Outpatient Medications  Medication Sig Dispense Refill   baclofen (LIORESAL) 10 MG tablet Take 1 tablet (10 mg total) by mouth 3 (three) times daily. 30 each 0   celecoxib (CELEBREX) 200 MG capsule Take 200 mg by mouth daily as needed for moderate pain (pain score 4-6).     dexamethasone (DECADRON) 4 MG tablet Take 2 tablets (8 mg) by mouth daily x 3 days starting the day after cisplatin chemotherapy. Take with food. 30 tablet 1   HYDROcodone-acetaminophen (NORCO) 5-325 MG tablet Take 1-2 tablets by mouth every 6 (six) hours as needed for moderate pain (pain score 4-6). 15 tablet 0   lidocaine (XYLOCAINE) 2 % solution Patient: Mix 1part 2% viscous lidocaine, 1part H20. Swish & swallow 10mL of diluted mixture, before meals and at bedtime, up to QID 200 mL 3   lidocaine-prilocaine (EMLA) cream Apply to affected area once 30 g 3   Nutritional Supplements (FEEDING SUPPLEMENT, OSMOLITE 1.5 CAL,) LIQD 7 cartons Osmolite 1.5 split over four feeding daily. Flush tube with 60 ml water before and after each bolus. Provide additional 3 1/2 cups (830 ml) water/day to meet hydration needs. Provides 2485  kcal, 104.3 g protein, 1267 ml free water from formula (2577 ml total water with flushes). 1659 ml/day meets 100% RDI     ondansetron (ZOFRAN) 8 MG tablet Take 1 tablet (8 mg total) by mouth every 8 (eight) hours as needed for nausea or vomiting. Start on the third day after cisplatin. 30 tablet 1   PARoxetine (PAXIL CR) 12.5 MG 24 hr tablet Take 1 tablet (12.5 mg total) by mouth daily. 30 tablet 0   predniSONE (DELTASONE) 20 MG tablet Take 4 tablets by mouth once  daily (80 mg dose) for 7 days, followed by 3 tablets by mouth once daily, continue until further instructions 60 tablet 1   prochlorperazine (COMPAZINE) 10 MG tablet Take 1 tablet (10 mg total) by mouth every 6 (six) hours as needed (Nausea or vomiting). 30 tablet 1   sucralfate (CARAFATE) 1 g tablet Dissolve 1 tablet in 10 mL H20 and swallow 30 min prior to meals and bedtime. 40 tablet 4   Vitamin D, Ergocalciferol, (DRISDOL) 1.25 MG (50000 UNIT) CAPS capsule Take 1 capsule (50,000 Units total) by mouth every 7 (seven) days. Take for 8 total doses(weeks) 8 capsule 0   No current facility-administered medications for this visit.    HISTORY OF PRESENT ILLNESS:   Oncology History  Malignant neoplasm of tonsillar fossa (HCC)  07/20/2023 Imaging   CT neck soft tissue: 2.5 x 3 cm mass in the left tonsillar region with extension towards the tongue base, suspicious for carcinoma, and evidence of level 2 lymphadenopathy on the left, consisting of a dominant node measuring 3 x 2 x 1.7 cm. A benign appearing osteoma was also demonstrated, projecting inferior from the angle of the left mandible.    08/26/2023 Initial Diagnosis   Malignant neoplasm of tonsillar fossa [facility]):  He presented to his PCP for routine follow-up in July 2024 and was incidentally noted to have left submandibular swelling on a routine examination performed at that time. A soft tissue head and neck ultrasound was subsequently performed on 07/07/23 which showed an ovoid mass measuring 2.8 x 1.9 cm, most consistent with an abnormally enlarged lymph node, and correlating with the palpable area in the left submandibular region.    A soft tissue neck CT was also performed on 07/20/23 which demonstrated: a  2.5 x 3 cm mass in the left tonsillar region with extension towards the tongue base, suspicious for carcinoma, and evidence of level 2 lymphadenopathy on the left, consisting of a dominant node measuring 3 x 2 x 1.7 cm. A benign appearing  osteoma was also demonstrated, projecting inferior from the angle of the left mandible.   Subsequently, the patient was referred to Dr. Roma Schanz at [facility] and underwent biopsies of the left tonsil on 08/26/23. 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    08/26/2023 Pathology Results   Patient was referred to Dr. Roma Schanz at [facility] and underwent biopsies of the left tonsil on 08/26/23. 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    09/07/2023 PET scan   PET scan performed on 09/07/23 at [facility]: intense radiotracer uptake within the left palatine tonsillar mass; adjacent radiotracer uptake within the region of the right palatine tonsil; and hypermetabolic nodes in the left neck extending to the supraclavicular region concerning for nodal metastases. Focal uptake at the anorectal junction was also demonstrated, possibly reflecting muscular contraction, however local malignancy can not be excluded. PET otherwise showed no  evidence of more distant metastatic disease.    09/14/2023 Cancer Staging   Staging form: Pharynx - HPV-Mediated Oropharynx, AJCC 8th Edition - Clinical stage from 09/14/2023: Stage I (cT2, cN1, cM0, p16+) - Signed by [name], MD on 09/14/2023 Stage prefix: Initial diagnosis   09/29/2023 -  Chemotherapy   Patient is on Treatment Plan : HEAD/NECK Cisplatin (40) q7d         REVIEW OF SYSTEMS:   Review of Systems - Oncology  All other pertinent systems were reviewed with the patient and are negative.   VITALS:   Blood pressure 109/67, pulse 78, temperature 98.3 F (36.8 C), temperature source Temporal, resp. rate 17, height 5\' 8"  (1.727 m), weight 172 lb (78 kg), SpO2 100%.  Wt Readings from Last 3 Encounters:  10/20/23 172 lb (78 kg)  10/12/23 179 lb 8 oz (81.4 kg)  10/05/23 176 lb 1.3 oz (79.9 kg)    Body mass index is 26.15  kg/m.  Performance status (ECOG): 0 - Asymptomatic  PHYSICAL EXAM:   Physical Exam Constitutional:      General: He is not in acute distress.    Appearance: Normal appearance.  HENT:     Head: Normocephalic and atraumatic.     Mouth/Throat:     Mouth: Mucous membranes are moist.     Comments: Erythema from radiation-induced changes in the back of the throat, within expected range. Eyes:     General: No scleral icterus.    Extraocular Movements: Extraocular movements intact.     Conjunctiva/sclera: Conjunctivae normal.  Cardiovascular:     Rate and Rhythm: Normal rate and regular rhythm.     Pulses: Normal pulses.     Heart sounds: Normal heart sounds.  Pulmonary:     Effort: Pulmonary effort is normal.     Breath sounds: Normal breath sounds.  Chest:     Comments: Right-sided Port-A-Cath in place without any signs of infection Abdominal:     General: There is no distension.  Musculoskeletal:     Right lower leg: No edema.     Left lower leg: No edema.  Lymphadenopathy:     Cervical: No cervical adenopathy.  Skin:    Findings: No rash.  Neurological:     General: No focal deficit present.     Mental Status: He is alert and oriented to person, place, and time.  Psychiatric:        Mood and Affect: Mood normal.        Behavior: Behavior normal.        Thought Content: Thought content normal.        Judgment: Judgment normal.      LABORATORY DATA:   I have reviewed the data as listed.   Results for orders placed or performed in visit on 10/20/23 (from the past 72 hour(s))  Magnesium     Status: None   Collection Time: 10/20/23  3:20 PM  Result Value Ref Range   Magnesium 2.1 1.7 - 2.4 mg/dL    Comment: Performed at [facility], [address], [place], [place] [address]  Basic Metabolic Panel - Cancer Center Only     Status: Abnormal   Collection Time: 10/20/23  3:20 PM  Result Value Ref Range   Sodium 133 (L) 135 - 145 mmol/L    Potassium 4.6 3.5 - 5.1 mmol/L   Chloride 99 98 - 111 mmol/L   CO2 28 22 - 32 mmol/L   Glucose, Bld 86 70 -  99 mg/dL    Comment: Glucose reference range applies only to samples taken after fasting for at least 8 hours.   BUN 18 6 - 20 mg/dL   Creatinine 1.61 0.96 - 1.24 mg/dL   Calcium 9.0 8.9 - 04.5 mg/dL   GFR, Estimated >40 >98 mL/min    Comment: (NOTE) Calculated using the CKD-EPI Creatinine Equation (2021)    Anion gap 6 5 - 15    Comment: Performed at [facility], [address] [place], [place] [address]  CBC with Differential (Cancer Center Only)     Status: Abnormal   Collection Time: 10/20/23  3:20 PM  Result Value Ref Range   WBC Count 4.0 4.0 - 10.5 K/uL   RBC 4.31 4.22 - 5.81 MIL/uL   Hemoglobin 13.6 13.0 - 17.0 g/dL   HCT 78.2 95.6 - 21.3 %   MCV 90.7 80.0 - 100.0 fL   MCH 31.6 26.0 - 34.0 pg   MCHC 34.8 30.0 - 36.0 g/dL   RDW 08.6 57.8 - 46.9 %   Platelet Count 168 150 - 400 K/uL   nRBC 0.0 0.0 - 0.2 %   Neutrophils Relative % 89 %   Neutro Abs 3.6 1.7 - 7.7 K/uL   Lymphocytes Relative 3 %   Lymphs Abs 0.1 (L) 0.7 - 4.0 K/uL   Monocytes Relative 8 %   Monocytes Absolute 0.3 0.1 - 1.0 K/uL   Eosinophils Relative 0 %   Eosinophils Absolute 0.0 0.0 - 0.5 K/uL   Basophils Relative 0 %   Basophils Absolute 0.0 0.0 - 0.1 K/uL   Immature Granulocytes 0 %   Abs Immature Granulocytes 0.01 0.00 - 0.07 K/uL    Comment: Performed at [facility], [address] [place], [place] [address]      RADIOGRAPHIC STUDIES:  I have personally reviewed the radiological images as listed and agree with the findings in the report.  OCT, Retina - OU - Both Eyes  Result Date: 10/06/2023 Right Eye Quality was good. Central Foveal Thickness: 246. Progression has no prior data. Findings include normal foveal contour, no IRF, no SRF, vitreomacular adhesion . Left Eye Central Foveal Thickness: 271. Progression has no prior data.  Findings include normal foveal contour, no IRF, no SRF, vitreomacular adhesion . 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 CME - cystoid macular edema. PED - pigment epithelial detachment. IRF - intraretinal fluid. SRF - subretinal fluid. EZ - ellipsoid zone. ERM - epiretinal membrane. ORA - outer retinal atrophy. ORT - outer retinal tubulation. SRHM - subretinal hyper-reflective material. IRHM - intraretinal hyper-reflective material   IR Gastrostomy Tube  Result Date: 09/22/2023 INDICATION: Chemo/radiation for head and neck cancer; placement of g-tube EXAM: Procedures: 1. FLUOROSCOPIC NASOGASTRIC TUBE PLACEMENT 2. PERCUTANEOUS GASTROSTOMY FEEDING TUBE PLACEMENT COMPARISON:  PET-CT, 09/07/2023. MEDICATIONS: Ancef 2 gm IV; Antibiotics were administered within 1 hour of the procedure. 1.0 mg Glucagon IV. CONTRAST:  20 mL of Isovue 300 administered into the gastric lumen. ANESTHESIA/SEDATION: Moderate (conscious) sedation was employed during this procedure. A total of Versed 2 mg and Fentanyl 100 mcg was administered intravenously. Moderate Sedation Time: 15 minutes. The patient's level of consciousness and vital signs were monitored continuously by radiology nursing throughout the procedure under my direct supervision. FLUOROSCOPY TIME:  Fluoroscopic dose; 4 mGy COMPLICATIONS: None immediate. PROCEDURE: Informed written consent was obtained from the patient  and/or patient's representative following explanation of the procedure, risks, benefits and alternatives. A time out was performed prior to the initiation of the procedure. Maximal barrier sterile technique utilized including caps, mask, sterile gowns, sterile gloves, large sterile drape, hand hygiene and sterile prep. The LEFT upper quadrant was sterilely prepped and draped. A oral gastric catheter was inserted into the stomach under  fluoroscopy. The LEFT costal margin and barium opacified transverse colon were identified and avoided. Air was injected into the stomach for insufflation and visualization under fluoroscopy. Under sterile conditions and local anesthesia, 2 T tacks were utilized to pexy the anterior aspect of the stomach against the ventral abdominal wall. Contrast injection confirmed appropriate positioning of each of the T tacks. An incision was made between the T tacks and a 17 gauge trocar needle was utilized to access the stomach. Needle position was confirmed within the stomach with aspiration of air and injection of a small amount of contrast. A stiff guidewire was advanced into the gastric lumen and under intermittent fluoroscopic guidance, the access needle was exchanged for a telescoping peel-away sheath, ultimately allowing placement of a 16 Fr balloon retention gastrostomy tube. The retention balloon was insufflated with a mixture of dilute saline and contrast and pulled taut against the anterior wall of the stomach. The external disc was cinched. Contrast injection confirms positioning within the stomach. Several spot radiographic images were obtained in various obliquities for documentation. The patient tolerated procedure well without immediate post procedural complication. FINDINGS: After successful fluoroscopic guided placement, the gastrostomy tube is appropriately positioned with internal retention balloon against the ventral aspect of the gastric lumen. IMPRESSION: Successful percutaneous placement of a 16 Fr balloon retention gastrostomy tube. The gastrostomy may be used immediately for medication administration and in 4 hrs for the initiation of feeds. RECOMMENDATIONS: The patient will return to Vascular Interventional Radiology (VIR) for routine feeding tube evaluation and exchange in 6 months. Roanna Banning, MD [facility] Radiology Electronically Signed   By: Roanna Banning M.D.   On: 09/22/2023 10:44   IR IMAGING GUIDED PORT INSERTION  Result Date: 09/22/2023 INDICATION: Chemotherapy for head and neck cancer EXAM: IMPLANTED PORT A CATH PLACEMENT WITH ULTRASOUND AND FLUOROSCOPIC GUIDANCE MEDICATIONS: None ANESTHESIA/SEDATION: Moderate (conscious) sedation was employed during this procedure. A total of Versed 2 mg and Fentanyl 100 mcg was administered intravenously. Moderate Sedation Time: 20 minutes. The patient's level of consciousness and vital signs were monitored continuously by radiology nursing throughout the procedure under my direct supervision. FLUOROSCOPY TIME:  Fluoroscopic dose; 0 mGy COMPLICATIONS: None immediate. PROCEDURE: The procedure, risks, benefits, and alternatives were explained to the patient. Questions regarding the procedure were encouraged and answered. The patient understands and consents to the procedure. The RIGHT neck and chest were prepped with chlorhexidine in a sterile fashion, and a sterile drape was applied covering the operative field. Maximum barrier sterile technique with sterile gowns and gloves were used for the procedure. A timeout was performed prior to the initiation of the procedure. Local anesthesia was provided with 1% lidocaine with epinephrine. After creating a small venotomy incision, a micropuncture kit was utilized to access the internal jugular vein under direct, real-time ultrasound guidance. Ultrasound image documentation was performed. The microwire was kinked to measure appropriate catheter length. A subcutaneous port pocket was then created along the upper chest wall utilizing a combination of sharp and blunt dissection. The pocket was irrigated with sterile saline. A single lumen Non-ISP power injectable port was chosen  for placement. The 8 Fr catheter was tunneled from the port pocket site to the venotomy incision. The port was placed in the pocket. The external catheter was trimmed to appropriate length. At the  venotomy, an 8 Fr peel-away sheath was placed over a guidewire under fluoroscopic guidance. The catheter was then placed through the sheath and the sheath was removed. Final catheter positioning was confirmed and documented with a fluoroscopic spot radiograph. The port was accessed with a Huber needle, aspirated and flushed with heparinized saline. The port pocket incision was closed with interrupted 3-0 Vicryl suture then Dermabond was applied, including at the venotomy incision. Dressings were placed. The patient tolerated the procedure well without immediate post procedural complication. IMPRESSION: Successful placement of a RIGHT internal jugular approach power injectable Port-A-Cath. The tip of the catheter is positioned within the proximal RIGHT atrium. The catheter is ready for immediate use. Roanna Banning, MD [facility] Radiology Electronically Signed   By: Roanna Banning M.D.   On: 09/22/2023 10:29    CODE STATUS:  Code Status History     Date Active Date Inactive Code Status Order ID Comments User Context   09/22/2023 1045 09/23/2023 0514 Full Code [id number]  Roanna Banning, MD [facility]   09/22/2023 1045 09/22/2023 1045 Full Code [id number]  Roanna Banning, MD [facility]    Questions for Most Recent Historical Code Status (Order [id number])     Question Answer   By: Consent: discussion documented in EHR             Future Appointments  Date Time Provider Department Center  10/21/2023  9:00 AM [facility] INFUSION [facility] None  10/21/2023  4:00 PM [facility] LINAC 4 [facility] None  10/21/2023  4:15 PM LINAC-SQUIRE [facility] None  10/23/2023  7:00 AM [facility] LINAC 4 [facility] None  10/24/2023 11:00 AM Bowman, Mary A, LCSW [facility] None  10/24/2023  4:00 PM [facility] LINAC 4 [facility] None  10/25/2023  8:00 AM Barron Alvine, CCC-SLP [facility]  10/25/2023  4:00 PM [facility] LINAC 4 [facility] None  10/26/2023  8:15 AM [facility] FLUSH [facility] None  10/26/2023  9:00 AM Kainoah Bartosiewicz, MD [facility] None  10/26/2023  1:00 PM [facility] LINAC 4 [facility] None  10/31/2023  4:00 PM [facility] LINAC 4 [facility] None  11/01/2023  2:00 PM Thresa Ross, MD [facility] None  11/01/2023  4:00 PM [facility] LINAC 4 [facility] None  11/02/2023  4:00 PM [facility] LINAC 4 [facility] None  11/03/2023  8:00 AM [facility] FLUSH [facility] None  11/03/2023  9:15 AM [facility] INFUSION [facility] None  11/03/2023 11:15 AM Neff, Barbara L, RD [facility] None  11/03/2023 11:20 AM Madilynne Mullan, MD [facility] None  11/03/2023  4:00 PM [facility] LINAC 4 [facility] None  11/04/2023  4:00 PM [facility] LINAC 4 [facility] None  11/07/2023  1:00 PM Bowman, Mary A, LCSW [facility] None  11/07/2023  4:00 PM [facility] LINAC 4 [facility] None  11/08/2023  4:00 PM [facility] LINAC 4 [facility] None  11/09/2023 12:00 PM Breedlove Blue, Blaire L, PT [facility] None  11/09/2023  4:00 PM [facility] LINAC 4 [facility] None  11/10/2023  4:00 PM [facility] LINAC 4 [facility] None  11/11/2023  4:00 PM [facility] LINAC 4 [facility] None  11/14/2023  4:00 PM [facility] LINAC 4 [facility] None  11/15/2023  4:00 PM [facility] LINAC 4 [facility] None  01/09/2024 10:00 AM Rennis Chris, MD [facility] None  06/20/2024  8:40 AM [name], MD [facility]      This  document was completed utilizing speech recognition software. Grammatical errors, random word insertions, pronoun errors, and incomplete sentences are an occasional consequence of this system due to software limitations, ambient noise, and hardware issues. Any formal questions or concerns about the content, text or information contained within the body of this dictation should be directly addressed to the provider for clarification.

## 2023-10-20 NOTE — Assessment & Plan Note (Addendum)
-  On the same day that he received first dose of cisplatin, he developed visual changes, described as a "3D" effect and double vision. The patient reports that the letters appear to be coming off each other and slightly offset, with the symptom being constant since onset. The patient has a history of good vision, with no need for corrective lenses in the past.  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 Though it is rare to happen with just 1 dose of cisplatin, given his symptoms, an ophthalmology referral was placed and we held Cisplatin on 10/05/23. -We started him on prednisone 1 mg/kg daily orally with plan to taper over the next 3 to 4 weeks. -Dr. Vanessa Barbara with ophthalmology evaluated patient on 10/06/2023.  No signs of optic neuritis..  Patient was cleared to resume cisplatin. -We quickly tapered off the prednisone and patient will cut down dose to 10 mg daily for the next 2 days and then stop.

## 2023-10-20 NOTE — Assessment & Plan Note (Signed)
-  Underwent total colectomy in 2016. Currently experiencing watery bowel movements approximately seven times a day. -Plan to refer to a genetic counselor after completion of tonsillar cancer treatments.

## 2023-10-21 ENCOUNTER — Other Ambulatory Visit: Payer: BC Managed Care – PPO

## 2023-10-21 ENCOUNTER — Ambulatory Visit
Admission: RE | Admit: 2023-10-21 | Discharge: 2023-10-21 | Disposition: A | Discharge status: Home / Self Care | Payer: BC Managed Care – PPO | Source: Ambulatory Visit | Attending: Radiation Oncology

## 2023-10-21 ENCOUNTER — Inpatient Hospital Stay: Payer: BC Managed Care – PPO

## 2023-10-21 ENCOUNTER — Ambulatory Visit: Payer: BC Managed Care – PPO

## 2023-10-21 ENCOUNTER — Other Ambulatory Visit: Payer: Self-pay

## 2023-10-21 VITALS — BP 117/76 | HR 80 | Temp 98.3°F | Resp 16

## 2023-10-21 DIAGNOSIS — C090 Malignant neoplasm of tonsillar fossa: Secondary | ICD-10-CM

## 2023-10-21 DIAGNOSIS — Z5111 Encounter for antineoplastic chemotherapy: Secondary | ICD-10-CM | POA: Diagnosis not present

## 2023-10-21 LAB — RAD ONC ARIA SESSION SUMMARY
Course Elapsed Days: 24
Plan Fractions Treated to Date: 19
Plan Prescribed Dose Per Fraction: 2 Gy
Plan Total Fractions Prescribed: 35
Plan Total Prescribed Dose: 70 Gy
Reference Point Dosage Given to Date: 38 Gy
Reference Point Session Dosage Given: 2 Gy
Session Number: 19

## 2023-10-21 MED ORDER — SODIUM CHLORIDE 0.9 % IV SOLN
40.0000 mg/m2 | Freq: Once | INTRAVENOUS | Status: AC
  Administered 2023-10-21: 79 mg via INTRAVENOUS
  Filled 2023-10-21: qty 79

## 2023-10-21 MED ORDER — SODIUM CHLORIDE 0.9 % IV SOLN: INTRAVENOUS | Status: DC

## 2023-10-21 MED ORDER — POTASSIUM CHLORIDE IN NACL 20-0.9 MEQ/L-% IV SOLN
Freq: Once | INTRAVENOUS | Status: AC
  Filled 2023-10-21: qty 1000

## 2023-10-21 MED ORDER — MAGNESIUM SULFATE 2 GM/50ML IV SOLN
2.0000 g | Freq: Once | INTRAVENOUS | Status: AC
  Administered 2023-10-21: 2 g via INTRAVENOUS
  Filled 2023-10-21: qty 50

## 2023-10-21 MED ORDER — HEPARIN SOD (PORK) LOCK FLUSH 100 UNIT/ML IV SOLN
500.0000 [IU] | Freq: Once | INTRAVENOUS | Status: AC | PRN
  Administered 2023-10-21: 500 [IU]

## 2023-10-21 MED ORDER — PALONOSETRON HCL INJECTION 0.25 MG/5ML
0.2500 mg | Freq: Once | INTRAVENOUS | Status: AC
  Administered 2023-10-21: 0.25 mg via INTRAVENOUS
  Filled 2023-10-21: qty 5

## 2023-10-21 MED ORDER — DEXAMETHASONE SODIUM PHOSPHATE 10 MG/ML IJ SOLN
10.0000 mg | Freq: Once | INTRAMUSCULAR | Status: AC
  Administered 2023-10-21: 10 mg via INTRAVENOUS
  Filled 2023-10-21: qty 1

## 2023-10-21 MED ORDER — SODIUM CHLORIDE 0.9 % IV SOLN
150.0000 mg | Freq: Once | INTRAVENOUS | Status: AC
  Administered 2023-10-21: 150 mg via INTRAVENOUS
  Filled 2023-10-21: qty 150

## 2023-10-21 MED ORDER — SODIUM CHLORIDE 0.9% FLUSH
10.0000 mL | INTRAVENOUS | Status: DC | PRN
  Administered 2023-10-21: 10 mL

## 2023-10-21 NOTE — Patient Instructions (Signed)
[facility] - A DEPT OF [facility]  Discharge Instructions: Thank you for choosing [facility] to provide your oncology and hematology care.   If you have a lab appointment with the Cancer Center, please go directly to the Cancer Center and check in at the registration area.   Wear comfortable clothing and clothing appropriate for easy access to any Portacath or PICC line.   We strive to give you quality time with your provider. You may need to reschedule your appointment if you arrive late (15 or more minutes).  Arriving late affects you and other patients whose appointments are after yours.  Also, if you miss three or more appointments without notifying the office, you may be dismissed from the clinic at the provider's discretion.      For prescription refill requests, have your pharmacy contact our office and allow 72 hours for refills to be completed.    Today you received the following chemotherapy and/or immunotherapy agent: Cisplatin      To help prevent nausea and vomiting after your treatment, we encourage you to take your nausea medication as directed.  BELOW ARE SYMPTOMS THAT SHOULD BE REPORTED IMMEDIATELY: *FEVER GREATER THAN 100.4 F (38 C) OR HIGHER *CHILLS OR SWEATING *NAUSEA AND VOMITING THAT IS NOT CONTROLLED WITH YOUR NAUSEA MEDICATION *UNUSUAL SHORTNESS OF BREATH *UNUSUAL BRUISING OR BLEEDING *URINARY PROBLEMS (pain or burning when urinating, or frequent urination) *BOWEL PROBLEMS (unusual diarrhea, constipation, pain near the anus) TENDERNESS IN MOUTH AND THROAT WITH OR WITHOUT PRESENCE OF ULCERS (sore throat, sores in mouth, or a toothache) UNUSUAL RASH, SWELLING OR PAIN  UNUSUAL VAGINAL DISCHARGE OR ITCHING   Items with * indicate a potential emergency and should be followed up as soon as possible or go to the Emergency Department if any problems should occur.  Please show the CHEMOTHERAPY ALERT CARD or IMMUNOTHERAPY  ALERT CARD at check-in to the Emergency Department and triage nurse.  Should you have questions after your visit or need to cancel or reschedule your appointment, please contact [facility] - A DEPT OF [facility]  Dept: [phone]  and follow the prompts.  Office hours are 8:00 a.m. to 4:30 p.m. Monday - Friday. Please note that voicemails left after 4:00 p.m. may not be returned until the following business day.  We are closed weekends and major holidays. You have access to a nurse at all times for urgent questions. Please call the main number to the clinic Dept: [phone] and follow the prompts.   For any non-urgent questions, you may also contact your provider using MyChart. We now offer e-Visits for anyone 71 and older to request care online for non-urgent symptoms. For details visit [email or web address].   Also download the MyChart app! Go to the app store, search "MyChart", open the app, select , and log in with your MyChart username and password.  Cisplatin Injection What is this medication? CISPLATIN (SIS pla tin) treats some types of cancer. It works by slowing down the growth of cancer cells. This medicine may be used for other purposes; ask your health care provider or pharmacist if you have questions. COMMON BRAND NAME(S): Platinol, Platinol -AQ What should I tell my care team before I take this medication? They need to know if you have any of these conditions: Eye disease, vision problems Hearing problems Kidney disease Low blood counts, such as low white cells, platelets, or red blood cells Tingling of  the fingers or toes, or other nerve disorder An unusual or allergic reaction to cisplatin, carboplatin, oxaliplatin, other medications, foods, dyes, or preservatives If you or your partner are pregnant or trying to get pregnant Breast-feeding How should I use this medication? This medication is injected into a vein. It is given  by your care team in a hospital or clinic setting. Talk to your care team about the use of this medication in children. Special care may be needed. Overdosage: If you think you have taken too much of this medicine contact a poison control center or emergency room at once. NOTE: This medicine is only for you. Do not share this medicine with others. What if I miss a dose? Keep appointments for follow-up doses. It is important not to miss your dose. Call your care team if you are unable to keep an appointment. What may interact with this medication? Do not take this medication with any of the following: Live virus vaccines This medication may also interact with the following: Certain antibiotics, such as amikacin, gentamicin, neomycin, polymyxin B, streptomycin, tobramycin, vancomycin Foscarnet This list may not describe all possible interactions. Give your health care provider a list of all the medicines, herbs, non-prescription drugs, or dietary supplements you use. Also tell them if you smoke, drink alcohol, or use illegal drugs. Some items may interact with your medicine. What should I watch for while using this medication? Your condition will be monitored carefully while you are receiving this medication. You may need blood work done while taking this medication. This medication may make you feel generally unwell. This is not uncommon, as chemotherapy can affect healthy cells as well as cancer cells. Report any side effects. Continue your course of treatment even though you feel ill unless your care team tells you to stop. This medication may increase your risk of getting an infection. Call your care team for advice if you get a fever, chills, sore throat, or other symptoms of a cold or flu. Do not treat yourself. Try to avoid being around people who are sick. Avoid taking medications that contain aspirin, acetaminophen, ibuprofen, naproxen, or ketoprofen unless instructed by your care team. These  medications may hide a fever. This medication may increase your risk to bruise or bleed. Call your care team if you notice any unusual bleeding. Be careful brushing or flossing your teeth or using a toothpick because you may get an infection or bleed more easily. If you have any dental work done, tell your dentist you are receiving this medication. Drink fluids as directed while you are taking this medication. This will help protect your kidneys. Call your care team if you get diarrhea. Do not treat yourself. Talk to your care team if you or your partner wish to become pregnant or think you might be pregnant. This medication can cause serious birth defects if taken during pregnancy and for 14 months after the last dose. A negative pregnancy test is required before starting this medication. A reliable form of contraception is recommended while taking this medication and for 14 months after the last dose. Talk to your care team about effective forms of contraception. Do not father a child while taking this medication and for 11 months after the last dose. Use a condom during sex during this time period. Do not breast-feed while taking this medication. This medication may cause infertility. Talk to your care team if you are concerned about your fertility. What side effects may I notice from receiving this  medication? Side effects that you should report to your care team as soon as possible: Allergic reactions--skin rash, itching, hives, swelling of the face, lips, tongue, or throat Eye pain, change in vision, vision loss Hearing loss, ringing in ears Infection--fever, chills, cough, sore throat, wounds that don't heal, pain or trouble when passing urine, general feeling of discomfort or being unwell Kidney injury--decrease in the amount of urine, swelling of the ankles, hands, or feet Low red blood cell level--unusual weakness or fatigue, dizziness, headache, trouble breathing Painful swelling, warmth,  or redness of the skin, blisters or sores at the infusion site Pain, tingling, or numbness in the hands or feet Unusual bruising or bleeding Side effects that usually do not require medical attention (report to your care team if they continue or are bothersome): Hair loss Nausea Vomiting This list may not describe all possible side effects. Call your doctor for medical advice about side effects. You may report side effects to FDA at 1-800-FDA-1088. Where should I keep my medication? This medication is given in a hospital or clinic. It will not be stored at home. NOTE: This sheet is a summary. It may not cover all possible information. If you have questions about this medicine, talk to your doctor, pharmacist, or health care provider.  2024 Elsevier/Gold Standard (2022-03-19 00:00:00)

## 2023-10-23 ENCOUNTER — Other Ambulatory Visit: Payer: Self-pay

## 2023-10-23 ENCOUNTER — Ambulatory Visit: Payer: BC Managed Care – PPO

## 2023-10-23 ENCOUNTER — Ambulatory Visit
Admission: RE | Admit: 2023-10-23 | Discharge: 2023-10-23 | Disposition: A | Discharge status: Home / Self Care | Payer: BC Managed Care – PPO | Source: Ambulatory Visit | Attending: Radiation Oncology

## 2023-10-23 DIAGNOSIS — Z5111 Encounter for antineoplastic chemotherapy: Secondary | ICD-10-CM | POA: Diagnosis not present

## 2023-10-23 LAB — RAD ONC ARIA SESSION SUMMARY
Course Elapsed Days: 26
Plan Fractions Treated to Date: 20
Plan Prescribed Dose Per Fraction: 2 Gy
Plan Total Fractions Prescribed: 35
Plan Total Prescribed Dose: 70 Gy
Reference Point Dosage Given to Date: 40 Gy
Reference Point Session Dosage Given: 2 Gy
Session Number: 20

## 2023-10-24 ENCOUNTER — Other Ambulatory Visit: Payer: Self-pay

## 2023-10-24 ENCOUNTER — Ambulatory Visit: Payer: BC Managed Care – PPO

## 2023-10-24 ENCOUNTER — Ambulatory Visit (INDEPENDENT_AMBULATORY_CARE_PROVIDER_SITE_OTHER): Payer: BC Managed Care – PPO | Admitting: Licensed Clinical Social Worker

## 2023-10-24 ENCOUNTER — Ambulatory Visit
Admission: RE | Admit: 2023-10-24 | Discharge: 2023-10-24 | Disposition: A | Discharge status: Home / Self Care | Payer: BC Managed Care – PPO | Source: Ambulatory Visit | Attending: Radiation Oncology | Admitting: Radiation Oncology

## 2023-10-24 DIAGNOSIS — F411 Generalized anxiety disorder: Secondary | ICD-10-CM | POA: Diagnosis not present

## 2023-10-24 DIAGNOSIS — F331 Major depressive disorder, recurrent, moderate: Secondary | ICD-10-CM | POA: Diagnosis not present

## 2023-10-24 DIAGNOSIS — F419 Anxiety disorder, unspecified: Secondary | ICD-10-CM | POA: Diagnosis not present

## 2023-10-24 DIAGNOSIS — F4310 Post-traumatic stress disorder, unspecified: Secondary | ICD-10-CM

## 2023-10-24 DIAGNOSIS — Z5111 Encounter for antineoplastic chemotherapy: Secondary | ICD-10-CM | POA: Diagnosis not present

## 2023-10-24 LAB — RAD ONC ARIA SESSION SUMMARY
Course Elapsed Days: 27
Plan Fractions Treated to Date: 21
Plan Prescribed Dose Per Fraction: 2 Gy
Plan Total Fractions Prescribed: 35
Plan Total Prescribed Dose: 70 Gy
Reference Point Dosage Given to Date: 42 Gy
Reference Point Session Dosage Given: 2 Gy
Session Number: 21

## 2023-10-24 NOTE — Progress Notes (Signed)
l

## 2023-10-24 NOTE — Progress Notes (Signed)
THERAPIST PROGRESS NOTE  Session Time: 11:00 AM to 11:48 AM  Participation Level: Active  Behavioral Response: CasualAlertappropriate  Type of Therapy: Individual Therapy  Treatment Goals addressed:  PTSD, anxiety, depression relationship issues, coping, coping with coping with medical treatment significant stressor   ProgressTowards Goals: Progressing-noted a good measure for patient and wife saying he has been doing good good indication wife would not lie, therapy helpful as well and staying positive also has a treatment team to help him while getting medical treatment  Interventions: DBT, Solution Focused, Strength-based, Supportive, and Other: coping  Summary: Harry Williams is a 42 y.o. 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 Doing radiation every day 5 days a week, chemo 1x a week. For most part doing things around the house maintaining. Wish was working. Can cause him to feel depressed. Before started treatment goal with his treatment team was to keep him a positive state of mind know what patients go through on day to day basis, do what they can on day to day basis to help be positive. Whole team of people keep him grounded nutritionist, liaison, therapy coach, teach how to keep using muscle in throat and neck there is a lot of change. Two doctors one deal with the chemo and one deals with the radiation side. Fourth week. Total of 7 weeks. They have been pretty much on point what to expect. Get prepared you might think you don't need a feeding tube trust it get it. When food starts to change taste sure enough the food doesn't taste good 2-3 weeks. Therapist asked if any good moments just tried to grit and bear if need to do it push through always tired. If anything wrong they would see if the treatment team said and he is not showing  anything things like stop smiling carry himself well at this point. Procedure 80%-90% cure so that is great news. Not going back to work talked to them about FMLA. Has caseworker looking at disability. Team of people helping him get through. Want to be working but not there.  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  Therapist asked if struggling with anything. He guesses the mental and physical the hardest absolute the hardest 12 year son not taking it well. Had to get with him, sooth things over it was rough told him everything ok process to get through to get better. Handling it better affecting school but better.  Noted the positive of 4th week it is moving along.    Appreciate coming here the only thing changed is cancer help with mind is little different stay grounded, stay positive through this process. What better place to be positive then come here what is here for, for him, help him further. Family visited walked around outside his older brother and wife.  We looked at DBT some coping strategies that are not helpful that wants to change patient says wife says handling pretty good with situation she won't lie guideline he is on track. Gets outside perspective of things.  Therapist noted this is good coping additionally therapy does the same thing get another perspective that helps with coping. Reviewed what he said last session feels like his life has been challenging upward track but does feel after treatment that would level out therapist believes that to be the  case to.  Reviewed treatment plan patient continues to find therapy helpful noted going through cancer treatment needs to stay positive in therapy as a place that facilitates that.  His wife is a good marker how he is doing she thinks he has been doing good good feedback for patient as he said she would not lie.  Looked at additional coping skills [employer].  A 4 step process of Identifying specific behaviors  and thoughts or feelings that are causing distress, second step challenging them evaluating and questioning these negative thoughts or behaviors learn to identify cognitive distortions and consider alternative perspectives.  Replacement developing healthier thoughts and behaviors to replace the unhelpful ones this step encourages proactive coping strategies.  Implementation putting the new healthier thoughts and behaviors and to practice and daily life this involves using the skills learned in the program to navigate challenges.  Therapist noted the helpfulness of replacement thoughts and behaviors.  For example going from I can handle this to I can manage this situation step-by-step going from what is something bad happens to I can focus on what I can control and prepare for the best outcome.  Noted coping for patient in general involves staying focused on positive.  Looked at DBT therapist thought might be helpful to start looking at skills as they deal with overwhelming emotions something that can help patient with regulating emotions going through the significant stressor.  Positive he has a team to support him.  Another positive therapist believes is not going back to his job therapist felt that was an obstacle glad for that reason patient not going back.  Therapist provided empathetic listening direct questioning discussed current and underlying problem noting the support from a team as well as focusing on staying positive is helpful.  Began distress tolerance discussing helpfulness of tolerating overwhelming emotions we will look at that further next timeuded older brother and his wife.    Suicidal/Homicidal: No  Plan: Return again in 2 weeks.2.  Look at distress tolerance, some more replacement thoughts with [employer], look at stronger than BPD for more emotional regulation 3.Start with mindfulness  Diagnosis: Major depressive disorder, recurrent, moderate, generalized anxiety disorder,  PTSD depression and anxiety  Collaboration of Care: Other none needed  Patient/Guardian was advised Release of Information must be obtained prior to any record release in order to collaborate their care with an outside provider. Patient/Guardian was advised if they have not already done so to contact the registration department to sign all necessary forms in order for [name] to release information regarding their care.   Consent: Patient/Guardian gives verbal consent for treatment and assignment of benefits for services provided during this visit. Patient/Guardian expressed understanding and agreed to proceed.   Coolidge Breeze, LCSW 10/24/2023

## 2023-10-25 ENCOUNTER — Ambulatory Visit
Admission: RE | Admit: 2023-10-25 | Discharge: 2023-10-25 | Discharge status: Home / Self Care | Payer: BC Managed Care – PPO | Source: Ambulatory Visit | Attending: Radiation Oncology

## 2023-10-25 ENCOUNTER — Other Ambulatory Visit: Payer: Self-pay

## 2023-10-25 ENCOUNTER — Ambulatory Visit: Payer: BC Managed Care – PPO

## 2023-10-25 ENCOUNTER — Ambulatory Visit: Payer: BC Managed Care – PPO | Attending: Radiation Oncology

## 2023-10-25 DIAGNOSIS — C090 Malignant neoplasm of tonsillar fossa: Secondary | ICD-10-CM | POA: Diagnosis not present

## 2023-10-25 DIAGNOSIS — R1310 Dysphagia, unspecified: Secondary | ICD-10-CM | POA: Diagnosis present

## 2023-10-25 DIAGNOSIS — Z5111 Encounter for antineoplastic chemotherapy: Secondary | ICD-10-CM | POA: Diagnosis not present

## 2023-10-25 LAB — RAD ONC ARIA SESSION SUMMARY
Course Elapsed Days: 28
Plan Fractions Treated to Date: 22
Plan Prescribed Dose Per Fraction: 2 Gy
Plan Total Fractions Prescribed: 35
Plan Total Prescribed Dose: 70 Gy
Reference Point Dosage Given to Date: 44 Gy
Reference Point Session Dosage Given: 2 Gy
Session Number: 22

## 2023-10-25 NOTE — Therapy (Signed)
OUTPATIENT SPEECH LANGUAGE PATHOLOGY ONCOLOGY EVALUATION   Patient Name: Harry Williams MRN: [id number] DOB:[birth year], 42 y.o., male Today's Date: 10/25/2023  PCP: Joseph Berkshire, MD REFERRING PROVIDER: [name], MD  END OF SESSION:  End of Session - 10/25/23 1733     Visit Number 1    Number of Visits 7    Date for SLP Re-Evaluation 01/23/24    SLP Start Time 0804    SLP Stop Time  0846    SLP Time Calculation (min) 42 min    Activity Tolerance Patient tolerated treatment well             Past Medical History:  Diagnosis Date   Anxiety    Asthma    Avascular necrosis of hip, left (HCC) 07/24/2021   Bright red blood per rectum 12/16/2022   Closed fracture of nasal bone with routine healing 12/24/2016   Depression    PTSD (post-traumatic stress disorder)    Past Surgical History:  Procedure Laterality Date   APPENDECTOMY     COLON SURGERY  03/30/2015   IR GASTROSTOMY TUBE MOD SED  09/22/2023   IR IMAGING GUIDED PORT INSERTION  09/22/2023   JOINT REPLACEMENT  2008   partial right elbow; right femoral graft   Total abdominal proctocolectomy, J-pouch, ileal pouch anal anastomosis  2016   Patient Active Problem List   Diagnosis Date Noted   Visual disturbances 10/05/2023   Hiccups 10/05/2023   GERD without esophagitis 10/05/2023   Port-A-Cath in place 10/04/2023   Bleeding hemorrhoid 09/29/2023   Familial adenomatous polyposis 09/15/2023   Malignant neoplasm of tonsillar fossa (HCC) 09/14/2023   Submandibular swelling 06/16/2023   Rotator cuff impingement syndrome, left 07/02/2022   Insomnia 04/01/2022   Vasovagal syncope 02/11/2022   Cervical spondylosis 11/13/2021   Healthcare maintenance 09/04/2021   Anxiety and depression 07/24/2021   Other spondylosis with radiculopathy, lumbar region 07/24/2021   Adenomatous polyp 04/04/2015    ONSET DATE: July 2024 (script  10/03/23)  REFERRING DIAG:  C09.0 (ICD-10-CM) - Malignant neoplasm of tonsillar  fossa (HCC)    THERAPY DIAG:  No diagnosis found.  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 He presented to his PCP July 2024 and was incidentally noted to have left submandibular swelling on routine examination. 07/07/23 neck US which showed an ovoid mass measuring 2.8 x 1.9 cm, most consistent with an abnormally enlarged lymph node, and correlating with the palpable area in the left submandibular region. 07/20/23 CT neck completed which demonstrated: a  2.5 x 3 cm mass in the left tonsillar region with extension towards the tongue base, suspicious for carcinoma, and evidence of level 2 lymphadenopathy on the left, consisting of a dominant node measuring 3 x 2 x 1.7 cm. A benign appearing osteoma was also demonstrated, projecting inferior from the angle of the left mandible. He was referred to Dr. Roma Schanz at [facility] and underwent biopsies of the left tonsil 08/26/23. Pathology findings revealed findings consistent with HPV associated invasive SCC, p 16 +. 09/07/23 PET at [facility] showed intense radiotracer uptake within the left palatine tonsillar mass; adjacent radiotracer uptake within the region of the right palatine tonsil; and hypermetabolic nodes in the left neck extending to the  supraclavicular region concerning for nodal metastases. Focal uptake at the anorectal junction was also demonstrated, possibly reflecting muscular contraction, however local malignancy can not be excluded. PET otherwise showed no evidence of more distant metastatic disease. Consult with Dr. Basilio Cairo 10/16 & Consult with Dr. [name] 10/17. Radiation/chemotherapy planned.Treatment plan:  He will receive 35 fractions of  radiation to his left tonsil and bilateral neck which started on 10/29 and will complete 12/18.  PAIN:  Are you having pain? Yes: NPRS scale: 7/10 Pain location: thyroid area and bil perithyroid area Pain description: scraping soreness Aggravating factors: swallowing  Relieving factors: resting, meds  FALLS: Has patient fallen in last 6 months?  No  LIVING ENVIRONMENT: Lives with: lives with their family Lives in: House/apartment  PLOF:  Level of assistance: Independent with ADLs, Independent with IADLs Employment: Full-time employment  PATIENT GOALS: Maintain WNL swallowing  OBJECTIVE:  Note: Objective measures were completed at Evaluation unless otherwise noted.  COGNITION: Overall cognitive status: Within functional limits for tasks assessed   LANGUAGE: Receptive and Expressive language appeared WNL.  ORAL MOTOR EXAMINATION: Overall status: Impaired:   Lingual: Left (ROM) Comments: minimal impairment to posterior oral cavity  MOTOR SPEECH: Overall motor speech: Appears intact  SUBJECTIVE DYSPHAGIA REPORTS:  Date of onset: last week Reported symptoms: odynophagia  Current diet: thin liquids, nectar thick liquids, and PEG  Co-morbid voice changes: No  FACTORS WHICH MAY INCREASE RISK OF ADVERSE EVENT IN PRESENCE OF ASPIRATION:  General health: well appearing  Risk factors: compromised immune system  CLINICAL SWALLOW ASSESSMENT:   Dentition: adequate natural dentition Vocal quality at baseline: normal Patient directly observed with POs: Yes: dysphagia 3 (soft), dysphagia 1 (puree), and thin liquids  Feeding: able to feed self Liquids provided by: cup Oral phase signs and symptoms:  none noted Pharyngeal phase signs and symptoms:  none noted today; pt winced with small bite of fig bar when swallowing but stated did not change pain level   TODAY'S TREATMENT:                                                                                                                                          DATE:   10/25/23 (eval): Research states the risk for dysphagia increases due to radiation and/or chemotherapy treatment due to a variety of factors, so SLP educated the pt about the possibility of reduced/limited ability for PO intake during rad tx. SLP also educated pt regarding possible changes to swallowing musculature after rad tx, and why adherence to dysphagia HEP provided today and PO consumption was necessary to inhibit muscle fibrosis following rad tx and to mitigate muscle disuse atrophy. SLP informed pt why this would be detrimental to their swallowing status and to their pulmonary health. Pt demonstrated understanding of these things to SLP. SLP encouraged pt to safely eat and drink as deep into their radiation/chemotherapy as possible to provide the best  possible long-term swallowing outcome for pt.  SLP then developed an individualized HEP for pt involving oral and pharyngeal strengthening and ROM and pt was instructed how to perform these exercises, including SLP demonstration. After SLP demonstration, pt return demonstrated each exercise. SLP ensured pt performance was correct prior to educating pt on next exercise. Pt required min cues faded to modified independent to perform HEP. Pt was instructed to complete this program 5-7 days/week, at least 20 reps a day until 6 months after his or her last day of rad tx, and then x2 a week after that, indefinitely. Among other modifications for days when pt cannot functionally swallow, SLP also suggested pt to perform only non-swallowing tasks on the handout/HEP, and if necessary to cycle through the swallowing portion so the full program of exercises can be completed instead of fatiguing on one of the swallowing exercises and being unable to perform the other swallowing exercises. SLP instructed that swallowing exercises should then be added back into the regimen as pt is able to do so. Secondly, pt was told that former  patients have told SLP that during their course of radiation therapy, taking prescribed pain medication just prior to performing HEP (and eating/drinking) has proven helpful in completing HEP (and eating and drinking) more regularly when going through their course of radiation treatment.    PATIENT EDUCATION: Education details: late effects head/neck radiation on swallow function, HEP procedure, and modification to HEP when difficulty experienced with swallowing during and after radiation course Person educated: Patient and Spouse Education method: Explanation, Demonstration, Verbal cues, and Handouts Education comprehension: verbalized understanding, returned demonstration, verbal cues required, and needs further education   ASSESSMENT:  CLINICAL IMPRESSION: Patient is a 42 y.o. M who was seen today for assessment of swallowing as they undergo radiation/chemoradiation therapy. Today pt ate items from Dys III and Dys I and drank thin liquids without overt s/s oral or pharyngeal difficulty. At this time pt swallowing is deemed WNL/WFL with these POs, however SLP does not expect pt to be sustained at this time by PO intake due to level of odynophagia. No oral or overt s/sx pharyngeal deficits, including aspiration were observed. There are no overt s/s aspiration PNA observed by SLP nor any reported by pt at this time. Data indicate that pt's swallow ability will likely decrease over the course of radiation/chemoradiation therapy and could very well decline over time following the conclusion of that therapy due to muscle disuse atrophy and/or muscle fibrosis. Pt will cont to need to be seen by SLP in order to assess safety of PO intake, assess the need for recommending any objective swallow assessment, and ensuring pt is correctly completing the individualized HEP.  OBJECTIVE IMPAIRMENTS: include dysphagia. These impairments are limiting patient from safety when swallowing. Factors affecting potential  to achieve goals and functional outcome are  none noted today . Patient will benefit from skilled SLP services to address above impairments and improve overall function.  REHAB POTENTIAL: Good   GOALS: Goals reviewed with patient? No SHORT TERM GOALS: Target: 3rd total session   1. Pt will compelte HEP with modified independence in 2 sessions Baseline: Goal status: INITIAL   2.  pt will tell SLP why pt is completing HEP with modified independence Baseline:  Goal status: INITIAL   3.  pt will describe 3 overt s/s aspiration PNA with modified independence Baseline:  Goal status: INITIAL   4.  pt will tell SLP how a food journal could hasten return to  a more normalized diet Baseline:  Goal status: INITIAL     LONG TERM GOALS: Target: 7th total session   1.  pt will complete HEP with independence over two visits Baseline:  Goal status: INITIAL   2.  pt will describe how to modify HEP over time, and the timeline associated with reduction in HEP frequency with modified independence over two sessions Baseline:  Goal status: INITIAL     PLAN:   SLP FREQUENCY:  once approx every 4 weeks   SLP DURATION:  7 sessions   PLANNED INTERVENTIONS: Aspiration precaution training, Pharyngeal strengthening exercises, Diet toleration management , Trials of upgraded texture/liquids, SLP instruction and feedback, Compensatory strategies, and Patient/family education    [facility], CCC-SLP 10/25/2023, 5:33 PM

## 2023-10-25 NOTE — Patient Instructions (Signed)
SWALLOWING EXERCISES Do these 5-6 days/week until 6 months after your last day of radiation, then 2 days per week afterwards You can use 1-2 drops of liquid to help you swallow, if your mouth gets dry  Effortful Swallows - Press your tongue against the roof of your mouth for 3 seconds, then swallow as hard as you can - Do at least 20 reps/day, in sets of 5-10  Masako Swallow - swallow with your tongue sticking out - Stick tongue out past your lips and gently bite tongue with your teeth - Swallow, while holding your tongue with your teeth - Do at least 20 reps/day, in sets of 5-10   Shaker Exercise - head lift - Lie flat on your back in your bed, the floor, or a couch  - Raise your head and look at your feet - KEEP YOUR SHOULDERS DOWN - HOLD FOR 45-60 SECONDS, then lower your head back down - Repeat 3 times, 2-3 times a day  [employer] - "squeeze swallow" exercise - Swallow, and squeeze tight to keep your Adam's Apple up - Hold the squeeze for 5-7 seconds - then relax - Do at least 20 reps/day, in sets of 5-10  5.   Chin tuck  (optional) - Place a rolled up towel (4 inches wide) under your chin near your neck - Tuck your chin and push hard on the towel for 5 seconds - Do at least 20 reps/day, in sets of 5-10

## 2023-10-25 NOTE — Progress Notes (Unsigned)
[facility]  ONCOLOGY CLINIC PROGRESS NOTE   Patient Care Team: [name], MD as PCP - General (Family Medicine) Noe Gens, MD as Referring Physician (Otolaryngology) [name], MD as Attending Physician (Radiation Oncology) [name], [name], RN as Oncology Nurse Navigator [name], MD as Consulting Physician (Oncology)  Date of visit: 10/26/2023   ASSESSMENT & PLAN:   Very pleasant 42 y.o. 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  Currently receiving concurrent chemoradiation with weekly cisplatin starting from 09/29/2023.  No problem-specific Assessment & Plan notes found for this encounter.     I reviewed lab results and outside records for this visit and discussed relevant results with the patient. Diagnosis, plan of care and treatment options were also discussed in detail with the patient. Opportunity provided to ask questions and answers provided to his apparent satisfaction. Provided instructions to call our clinic with any problems, questions or concerns prior to return visit. I recommended to continue follow-up with PCP and sub-specialists. He verbalized understanding and agreed with the plan.   NCCN guidelines have been consulted in the planning of this patient's care.  I provided 30 minutes of face-to-face time during this encounter and > 50% was spent counseling as documented under my assessment and plan.    [name], MD  10/25/2023 2:50 PM  [facility] AT [facility] [address] [place] [address] Dept: [phone] Dept Fax: [phone]    CHIEF COMPLAINT/ REASON FOR VISIT:   Recently diagnosed squamous of carcinoma of left tonsillar fossa, p16  positive.  Current Treatment: Concurrent chemoradiation with weekly cisplatin, chemo started from 09/29/2023  INTERVAL HISTORY:    Discussed the use of AI scribe software for clinical note transcription with the patient, who gave verbal consent to proceed.   Harry Williams is here today for repeat clinical assessment. He denies fevers or chills.   He reports that the vision problems have resolved. He is currently tapering off prednisone, down to one 20mg  tablet per day.  The patient reports soreness in his throat, particularly when swallowing and doing mouth exercises. He is having difficulty eating by mouth, but is trying to maintain oral intake. He has been using a feeding tube regularly, mainly with Osmolite, and is trying to consume seven cartons a day to combat weight loss. He is also using baking soda and salt mouthwash, lidocaine, and circletate to manage oral discomfort.  He has been experiencing some redness in the mouth, which is within the expected range at this point in his radiation treatment.  I have reviewed the past medical history, past surgical history, social history and family history with the patient and they are unchanged from previous note.  ALLERGIES: He has No Known Allergies.  MEDICATIONS:  Current Outpatient Medications  Medication Sig Dispense Refill   baclofen (LIORESAL) 10 MG tablet Take 1 tablet (10 mg total) by mouth 3 (three) times daily. 30 each 0   celecoxib (CELEBREX) 200 MG capsule Take 200 mg by mouth daily as needed for moderate pain (pain score 4-6).     dexamethasone (DECADRON) 4 MG tablet Take 2 tablets (8 mg) by mouth daily x 3 days starting the day after cisplatin chemotherapy. Take with food. 30 tablet 1   HYDROcodone-acetaminophen (NORCO) 5-325 MG tablet  Take 1-2 tablets by mouth every 6 (six) hours as needed for moderate pain (pain score 4-6). 15 tablet 0   lidocaine (XYLOCAINE) 2 % solution Patient: Mix 1part 2% viscous lidocaine, 1part H20.  Swish & swallow 10mL of diluted mixture, before meals and at bedtime, up to QID 200 mL 3   lidocaine-prilocaine (EMLA) cream Apply to affected area once 30 g 3   Nutritional Supplements (FEEDING SUPPLEMENT, OSMOLITE 1.5 CAL,) LIQD 7 cartons Osmolite 1.5 split over four feeding daily. Flush tube with 60 ml water before and after each bolus. Provide additional 3 1/2 cups (830 ml) water/day to meet hydration needs. Provides 2485 kcal, 104.3 g protein, 1267 ml free water from formula (2577 ml total water with flushes). 1659 ml/day meets 100% RDI     ondansetron (ZOFRAN) 8 MG tablet Take 1 tablet (8 mg total) by mouth every 8 (eight) hours as needed for nausea or vomiting. Start on the third day after cisplatin. 30 tablet 1   PARoxetine (PAXIL CR) 12.5 MG 24 hr tablet Take 1 tablet (12.5 mg total) by mouth daily. 30 tablet 0   predniSONE (DELTASONE) 20 MG tablet Take 4 tablets by mouth once daily (80 mg dose) for 7 days, followed by 3 tablets by mouth once daily, continue until further instructions 60 tablet 1   prochlorperazine (COMPAZINE) 10 MG tablet Take 1 tablet (10 mg total) by mouth every 6 (six) hours as needed (Nausea or vomiting). 30 tablet 1   sucralfate (CARAFATE) 1 g tablet Dissolve 1 tablet in 10 mL H20 and swallow 30 min prior to meals and bedtime. 40 tablet 4   Vitamin D, Ergocalciferol, (DRISDOL) 1.25 MG (50000 UNIT) CAPS capsule Take 1 capsule (50,000 Units total) by mouth every 7 (seven) days. Take for 8 total doses(weeks) 8 capsule 0   No current facility-administered medications for this visit.    HISTORY OF PRESENT ILLNESS:   Oncology History  Malignant neoplasm of tonsillar fossa (HCC)  07/20/2023 Imaging   CT neck soft tissue: 2.5 x 3 cm mass in the left tonsillar region with extension towards the tongue base, suspicious for carcinoma, and evidence of level 2 lymphadenopathy on the left, consisting of a dominant node measuring 3 x 2 x 1.7 cm. A benign appearing osteoma was  also demonstrated, projecting inferior from the angle of the left mandible.    08/26/2023 Initial Diagnosis   Malignant neoplasm of tonsillar fossa [facility]):  He presented to his PCP for routine follow-up in July 2024 and was incidentally noted to have left submandibular swelling on a routine examination performed at that time. A soft tissue head and neck ultrasound was subsequently performed on 07/07/23 which showed an ovoid mass measuring 2.8 x 1.9 cm, most consistent with an abnormally enlarged lymph node, and correlating with the palpable area in the left submandibular region.    A soft tissue neck CT was also performed on 07/20/23 which demonstrated: a  2.5 x 3 cm mass in the left tonsillar region with extension towards the tongue base, suspicious for carcinoma, and evidence of level 2 lymphadenopathy on the left, consisting of a dominant node measuring 3 x 2 x 1.7 cm. A benign appearing osteoma was also demonstrated, projecting inferior from the angle of the left mandible.   Subsequently, the patient was referred to Dr. Roma Schanz at [facility] and underwent biopsies of the left tonsil on 08/26/23. 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  08/26/2023 Pathology Results   Patient was referred to Dr. Roma Schanz at [facility] and underwent biopsies of the left tonsil on 08/26/23. 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    09/07/2023 PET scan   PET scan performed on 09/07/23 at [facility]: intense radiotracer uptake within the left palatine tonsillar mass; adjacent radiotracer uptake within the region of the right palatine tonsil; and hypermetabolic nodes in the left neck extending to the supraclavicular region concerning for nodal metastases. Focal uptake at the anorectal junction was also demonstrated, possibly reflecting muscular contraction, however local malignancy can not be  excluded. PET otherwise showed no evidence of more distant metastatic disease.    09/14/2023 Cancer Staging   Staging form: Pharynx - HPV-Mediated Oropharynx, AJCC 8th Edition - Clinical stage from 09/14/2023: Stage I (cT2, cN1, cM0, p16+) - Signed by [name], MD on 09/14/2023 Stage prefix: Initial diagnosis   09/29/2023 -  Chemotherapy   Patient is on Treatment Plan : HEAD/NECK Cisplatin (40) q7d         REVIEW OF SYSTEMS:   Review of Systems - Oncology  All other pertinent systems were reviewed with the patient and are negative.   VITALS:   There were no vitals taken for this visit.  Wt Readings from Last 3 Encounters:  10/20/23 172 lb (78 kg)  10/12/23 179 lb 8 oz (81.4 kg)  10/05/23 176 lb 1.3 oz (79.9 kg)    There is no height or weight on file to calculate BMI.  Performance status (ECOG): 0 - Asymptomatic  PHYSICAL EXAM:   Physical Exam Constitutional:      General: He is not in acute distress.    Appearance: Normal appearance.  HENT:     Head: Normocephalic and atraumatic.     Mouth/Throat:     Mouth: Mucous membranes are moist.     Comments: Erythema from radiation-induced changes in the back of the throat, within expected range. Eyes:     General: No scleral icterus.    Extraocular Movements: Extraocular movements intact.     Conjunctiva/sclera: Conjunctivae normal.  Cardiovascular:     Rate and Rhythm: Normal rate and regular rhythm.     Pulses: Normal pulses.     Heart sounds: Normal heart sounds.  Pulmonary:     Effort: Pulmonary effort is normal.     Breath sounds: Normal breath sounds.  Chest:     Comments: Right-sided Port-A-Cath in place without any signs of infection Abdominal:     General: There is no distension.  Musculoskeletal:     Right lower leg: No edema.     Left lower leg: No edema.  Lymphadenopathy:     Cervical: No cervical adenopathy.  Skin:    Findings: No rash.  Neurological:     General: No focal deficit present.      Mental Status: He is alert and oriented to person, place, and time.  Psychiatric:        Mood and Affect: Mood normal.        Behavior: Behavior normal.        Thought Content: Thought content normal.        Judgment: Judgment normal.      LABORATORY DATA:   I have reviewed the data as listed.   No results found for this or any previous visit (from the past 72 hour(s)).     RADIOGRAPHIC STUDIES:  I have personally reviewed the radiological images as listed and agree  with the findings in the report.  OCT, Retina - OU - Both Eyes  Result Date: 10/06/2023 Right Eye Quality was good. Central Foveal Thickness: 246. Progression has no prior data. Findings include normal foveal contour, no IRF, no SRF, vitreomacular adhesion . Left Eye Central Foveal Thickness: 271. Progression has no prior data. Findings include normal foveal contour, no IRF, no SRF, vitreomacular adhesion . 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 CME - cystoid macular edema. PED - pigment epithelial detachment. IRF - intraretinal fluid. SRF - subretinal fluid. EZ - ellipsoid zone. ERM - epiretinal membrane. ORA - outer retinal atrophy. ORT - outer retinal tubulation. SRHM - subretinal hyper-reflective material. IRHM - intraretinal hyper-reflective material    CODE STATUS:  Code Status History     Date Active Date Inactive Code Status Order ID Comments User Context   09/22/2023 1045 09/23/2023 0514 Full Code [id number]  Roanna Banning, MD [facility]   09/22/2023 1045 09/22/2023 1045 Full Code [id number]  Roanna Banning, MD [facility]    Questions for Most Recent Historical Code Status (Order [id number])     Question Answer   By: Consent: discussion documented in EHR             Future Appointments  Date Time Provider Department Center  10/25/2023  3:45 PM [facility] LINAC 4 [facility] None  10/25/2023   4:00 PM LINAC-SQUIRE [facility] None  10/26/2023  8:15 AM [facility] FLUSH [facility] None  10/26/2023  9:00 AM Kazuki Ingle, MD [facility] None  10/26/2023  1:00 PM [facility] LINAC 4 [facility] None  10/31/2023  4:00 PM [facility] LINAC 4 [facility] None  11/01/2023  2:00 PM Thresa Ross, MD [facility] None  11/01/2023  4:00 PM [facility] LINAC 4 [facility] None  11/02/2023  4:00 PM [facility] LINAC 4 [facility] None  11/03/2023  8:00 AM [facility] FLUSH [facility] None  11/03/2023  9:15 AM [facility] INFUSION [facility] None  11/03/2023 11:15 AM Neff, Barbara L, RD [facility] None  11/03/2023 11:20 AM Jayliana Valencia, MD [facility] None  11/03/2023  4:00 PM [facility] LINAC 4 [facility] None  11/04/2023  4:00 PM [facility] LINAC 4 [facility] None  11/07/2023  1:00 PM Bowman, Mary A, LCSW [facility] None  11/07/2023  4:00 PM [facility] LINAC 4 [facility] None  11/08/2023  4:00 PM [facility] LINAC 4 [facility] None  11/09/2023 12:00 PM Breedlove Blue, Blaire L, PT [facility] None  11/09/2023  4:00 PM [facility] LINAC 4 [facility] None  11/10/2023  4:00 PM [facility] LINAC 4 [facility] None  11/11/2023  4:00 PM [facility] LINAC 4 [facility] None  11/14/2023  4:00 PM [facility] LINAC 4 [facility] None  11/15/2023  4:00 PM [facility] LINAC 4 [facility] None  12/02/2023  8:45 AM Barron Alvine, CCC-SLP [facility]  12/20/2023 11:00 AM Mollie Germany, LCSW [facility] None  01/09/2024 10:00 AM Rennis Chris, MD [facility] None  06/20/2024  8:40 AM [name], MD [facility]      This document was completed utilizing speech recognition software. Grammatical errors, random word insertions, pronoun errors, and incomplete sentences are an occasional consequence of this system due to software limitations, ambient noise, and hardware issues. Any formal questions or concerns about the content, text or information contained within the body of this dictation should be directly  addressed to the provider for clarification.

## 2023-10-26 ENCOUNTER — Telehealth: Payer: Self-pay | Admitting: *Deleted

## 2023-10-26 ENCOUNTER — Telehealth: Payer: Self-pay

## 2023-10-26 ENCOUNTER — Ambulatory Visit: Payer: BC Managed Care – PPO

## 2023-10-26 ENCOUNTER — Encounter: Payer: Self-pay | Admitting: Oncology

## 2023-10-26 ENCOUNTER — Other Ambulatory Visit: Payer: Self-pay

## 2023-10-26 ENCOUNTER — Inpatient Hospital Stay: Payer: BC Managed Care – PPO | Admitting: Oncology

## 2023-10-26 ENCOUNTER — Ambulatory Visit
Admission: RE | Admit: 2023-10-26 | Discharge: 2023-10-26 | Disposition: A | Discharge status: Home / Self Care | Payer: BC Managed Care – PPO | Source: Ambulatory Visit | Attending: Radiation Oncology | Admitting: Radiation Oncology

## 2023-10-26 ENCOUNTER — Inpatient Hospital Stay: Payer: BC Managed Care – PPO

## 2023-10-26 VITALS — BP 120/76 | HR 94 | Temp 97.9°F | Resp 20 | Wt 167.8 lb

## 2023-10-26 DIAGNOSIS — C090 Malignant neoplasm of tonsillar fossa: Secondary | ICD-10-CM

## 2023-10-26 DIAGNOSIS — G893 Neoplasm related pain (acute) (chronic): Secondary | ICD-10-CM | POA: Diagnosis not present

## 2023-10-26 DIAGNOSIS — T451X5A Adverse effect of antineoplastic and immunosuppressive drugs, initial encounter: Secondary | ICD-10-CM | POA: Diagnosis not present

## 2023-10-26 DIAGNOSIS — Z5111 Encounter for antineoplastic chemotherapy: Secondary | ICD-10-CM | POA: Diagnosis not present

## 2023-10-26 DIAGNOSIS — D701 Agranulocytosis secondary to cancer chemotherapy: Secondary | ICD-10-CM

## 2023-10-26 DIAGNOSIS — Z95828 Presence of other vascular implants and grafts: Secondary | ICD-10-CM

## 2023-10-26 LAB — CBC WITH DIFFERENTIAL (CANCER CENTER ONLY)
Abs Immature Granulocytes: 0 10*3/uL (ref 0.00–0.07)
Basophils Absolute: 0 10*3/uL (ref 0.0–0.1)
Basophils Relative: 1 %
Eosinophils Absolute: 0.1 10*3/uL (ref 0.0–0.5)
Eosinophils Relative: 2 %
HCT: 39 % (ref 39.0–52.0)
Hemoglobin: 13.8 g/dL (ref 13.0–17.0)
Immature Granulocytes: 0 %
Lymphocytes Relative: 4 %
Lymphs Abs: 0.1 10*3/uL — ABNORMAL LOW (ref 0.7–4.0)
MCH: 31.7 pg (ref 26.0–34.0)
MCHC: 35.4 g/dL (ref 30.0–36.0)
MCV: 89.4 fL (ref 80.0–100.0)
Monocytes Absolute: 0.3 10*3/uL (ref 0.1–1.0)
Monocytes Relative: 12 %
Neutro Abs: 2.4 10*3/uL (ref 1.7–7.7)
Neutrophils Relative %: 81 %
Platelet Count: 116 10*3/uL — ABNORMAL LOW (ref 150–400)
RBC: 4.36 MIL/uL (ref 4.22–5.81)
RDW: 12 % (ref 11.5–15.5)
WBC Count: 2.9 10*3/uL — ABNORMAL LOW (ref 4.0–10.5)
nRBC: 0 % (ref 0.0–0.2)

## 2023-10-26 LAB — RAD ONC ARIA SESSION SUMMARY
Course Elapsed Days: 29
Plan Fractions Treated to Date: 23
Plan Prescribed Dose Per Fraction: 2 Gy
Plan Total Fractions Prescribed: 35
Plan Total Prescribed Dose: 70 Gy
Reference Point Dosage Given to Date: 46 Gy
Reference Point Session Dosage Given: 2 Gy
Session Number: 23

## 2023-10-26 LAB — BASIC METABOLIC PANEL - CANCER CENTER ONLY
Anion gap: 5 (ref 5–15)
BUN: 14 mg/dL (ref 6–20)
CO2: 30 mmol/L (ref 22–32)
Calcium: 9.2 mg/dL (ref 8.9–10.3)
Chloride: 98 mmol/L (ref 98–111)
Creatinine: 1.01 mg/dL (ref 0.61–1.24)
GFR, Estimated: >60 mL/min (ref >60)
Glucose, Bld: 199 mg/dL — ABNORMAL HIGH (ref 70–99)
Potassium: 4.2 mmol/L (ref 3.5–5.1)
Sodium: 133 mmol/L — ABNORMAL LOW (ref 135–145)

## 2023-10-26 MED ORDER — HEPARIN SOD (PORK) LOCK FLUSH 100 UNIT/ML IV SOLN
500.0000 [IU] | Freq: Once | INTRAVENOUS | Status: AC
  Administered 2023-10-26: 500 [IU]

## 2023-10-26 MED ORDER — HYDROCODONE-ACETAMINOPHEN 10-325 MG PO TABS: 1.0000 | ORAL_TABLET | Freq: Four times a day (QID) | ORAL | 0 refills | Status: DC | PRN

## 2023-10-26 MED ORDER — SODIUM CHLORIDE 0.9% FLUSH
10.0000 mL | Freq: Once | INTRAVENOUS | Status: AC
Start: 2023-10-26 — End: 2023-10-26
  Administered 2023-10-26: 10 mL

## 2023-10-26 NOTE — Assessment & Plan Note (Addendum)
White blood cell count decreased to 2900, platelets decreased to 116,000. No symptoms of infection reported. -Skip chemotherapy this week due to decreased counts and also to accommodate holiday schedule -Plan to resume chemotherapy next week

## 2023-10-26 NOTE — Telephone Encounter (Signed)
Patient requested FMLA for work. FMLA completed and faxed to company. Fax confirmation received. Copy of documents handed to patient. No further concerns at this time.

## 2023-10-26 NOTE — Assessment & Plan Note (Addendum)
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   - Previously discussed the benefits and risks of radiation alone vs combination of chemotherapy and radiation. Given the patient's age and the size of the lymph node which is borderline at 3 cm, the benefits of adding chemotherapy slightly outweigh the risks. After discussing side effect profile, patient did opt for weekly cisplatin.  Plan made to proceed with weekly cisplatin at 40 mg/m dose during the course of radiation with appropriate pre and post hydration and IV nausea medications.  -He started radiation treatments from 09/27/2023.  Started first dose of cisplatin on 09/29/2023.  -On the same day that he received first dose of cisplatin, he developed visual changes, described as a "3D" effect and double vision. Although very rare, optic neuritis has been reported with cisplatin use on literature review, especially when used in combination with other chemotherapeutic agents.  Though it is rare to happen with just 1 dose of cisplatin, given his symptoms, an ophthalmology referral was placed and we held Cisplatin on 10/05/23. Dr. Vanessa Barbara with ophthalmology evaluated patient on 10/06/2023.  No signs of optic neuritis. Patient was cleared to resume cisplatin.  - We resumed cisplatin from 10/14/2023 and continue this weekly during the course of radiation.  - Labs today reveal leukopenia with white count of 2900 but ANC is 2400.  Platelet count is also slightly low at 116,000.  Hemoglobin stable at 13.8.  Creatinine 1.01.  Grossly unremarkable BMP.  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  He is relying on his feeding tube mostly.  -Since it is Thanksgiving holidays tomorrow and the day after, we could not accommodate him in our treatment room schedule this week.  Will plan for chemotherapy next week.  The break would also help with his  cytopenias and some of the symptoms that he is experiencing.  I will see him next week for follow-up and continuation of chemotherapy

## 2023-10-26 NOTE — Telephone Encounter (Signed)
Spoke with pts wife regarding 12/5 appts starting at 0800.  Requested patient to be here at 0745 in order to get treatment started sooner.  Pts wife agreed and appt changed.  While on the phone, she asked about subsequent appts and I explained that according to Dr [name] note, he would like to review treatment plan with them on Friday.  Wife stated understanding of plan.

## 2023-10-26 NOTE — Assessment & Plan Note (Signed)
Increasing difficulty swallowing, using baking soda, salt, and lidocaine mouthwash. Pain managed with Norco. -Increase Norco dosage to 10 and sent prescription for 60 tablets to [facility] on [address]. -Continue mouthwashes 4-6 times daily.

## 2023-10-31 ENCOUNTER — Other Ambulatory Visit: Payer: Self-pay | Admitting: Oncology

## 2023-10-31 ENCOUNTER — Other Ambulatory Visit: Payer: Self-pay | Admitting: Radiation Oncology

## 2023-10-31 ENCOUNTER — Other Ambulatory Visit: Payer: Self-pay | Admitting: *Deleted

## 2023-10-31 ENCOUNTER — Other Ambulatory Visit: Payer: Self-pay

## 2023-10-31 ENCOUNTER — Other Ambulatory Visit (HOSPITAL_COMMUNITY): Payer: Self-pay

## 2023-10-31 ENCOUNTER — Encounter: Payer: Self-pay | Admitting: Oncology

## 2023-10-31 ENCOUNTER — Ambulatory Visit
Admission: RE | Admit: 2023-10-31 | Discharge: 2023-10-31 | Disposition: A | Discharge status: Home / Self Care | Payer: BC Managed Care – PPO | Source: Ambulatory Visit | Attending: Radiation Oncology | Admitting: Radiation Oncology

## 2023-10-31 DIAGNOSIS — C090 Malignant neoplasm of tonsillar fossa: Secondary | ICD-10-CM

## 2023-10-31 DIAGNOSIS — R509 Fever, unspecified: Secondary | ICD-10-CM

## 2023-10-31 DIAGNOSIS — G893 Neoplasm related pain (acute) (chronic): Secondary | ICD-10-CM

## 2023-10-31 LAB — RAD ONC ARIA SESSION SUMMARY
Course Elapsed Days: 34
Plan Fractions Treated to Date: 24
Plan Prescribed Dose Per Fraction: 2 Gy
Plan Total Fractions Prescribed: 35
Plan Total Prescribed Dose: 70 Gy
Reference Point Dosage Given to Date: 48 Gy
Reference Point Session Dosage Given: 2 Gy
Session Number: 24

## 2023-10-31 MED ORDER — SCOPOLAMINE 1 MG/3DAYS TD PT72: 1.0000 | MEDICATED_PATCH | TRANSDERMAL | 2 refills | Status: DC

## 2023-10-31 MED ORDER — HYDROCODONE-ACETAMINOPHEN 7.5-325 MG/15ML PO SOLN
10.0000 mL | ORAL | 0 refills | Status: DC | PRN
  Filled 2023-10-31: qty 500, 9d supply, fill #0

## 2023-10-31 MED ORDER — OMEPRAZOLE 2 MG/ML ORAL SUSPENSION: 20.0000 mg | Freq: Every day | ORAL | 2 refills | Status: DC

## 2023-10-31 NOTE — Progress Notes (Signed)
Oncology Nurse Navigator Documentation   I received a call from Harry Williams wife Harry Williams. Over the holiday weekend Harry Williams has developed increased pain to his throat. He is no longer able to eat food orally and is using his PEG tube for all nutrition. She also informed me that he has had intermittent fevers over the weekend up to 101 degrees as well. I notified Dr. [name] of the above and he recommended Harry Williams visit the ED if his fevers persist and Dr. [name] also prescribed liquid hydrocodone for pain relief. I have informed Harry Williams of Dr. Zenda Alpers recommendations and that the prescription has been sent to [facility] pharmacy for pick up today. She knows to call me if I can help her with anything else.   Hedda Slade RN, BSN, OCN Head & Neck Oncology Nurse Navigator [facility] at [facility] Phone # [phone]  Fax # [phone]

## 2023-11-01 ENCOUNTER — Ambulatory Visit: Payer: BC Managed Care – PPO

## 2023-11-01 ENCOUNTER — Other Ambulatory Visit: Payer: Self-pay

## 2023-11-01 ENCOUNTER — Inpatient Hospital Stay (HOSPITAL_COMMUNITY)
Admission: AD | Admit: 2023-11-01 | Discharge: 2023-11-05 | DRG: 809 | Disposition: A | Discharge status: Home / Self Care | Payer: BC Managed Care – PPO | Source: Ambulatory Visit | Attending: Internal Medicine | Admitting: Internal Medicine

## 2023-11-01 ENCOUNTER — Other Ambulatory Visit: Payer: Self-pay | Admitting: Physician Assistant

## 2023-11-01 ENCOUNTER — Encounter (HOSPITAL_COMMUNITY): Payer: Self-pay | Admitting: Internal Medicine

## 2023-11-01 ENCOUNTER — Ambulatory Visit (HOSPITAL_COMMUNITY): Payer: BC Managed Care – PPO | Admitting: Psychiatry

## 2023-11-01 ENCOUNTER — Inpatient Hospital Stay: Payer: BC Managed Care – PPO

## 2023-11-01 ENCOUNTER — Inpatient Hospital Stay (HOSPITAL_BASED_OUTPATIENT_CLINIC_OR_DEPARTMENT_OTHER): Payer: BC Managed Care – PPO | Admitting: Physician Assistant

## 2023-11-01 ENCOUNTER — Other Ambulatory Visit: Payer: Self-pay | Admitting: *Deleted

## 2023-11-01 ENCOUNTER — Encounter (HOSPITAL_COMMUNITY): Payer: Self-pay

## 2023-11-01 ENCOUNTER — Ambulatory Visit (HOSPITAL_COMMUNITY)
Admission: RE | Admit: 2023-11-01 | Discharge: 2023-11-01 | Disposition: A | Discharge status: Home / Self Care | Payer: BC Managed Care – PPO | Source: Ambulatory Visit | Attending: Physician Assistant

## 2023-11-01 VITALS — BP 146/95 | HR 97 | Temp 100.3°F | Resp 17 | Wt 171.4 lb

## 2023-11-01 DIAGNOSIS — R5081 Fever presenting with conditions classified elsewhere: Secondary | ICD-10-CM | POA: Diagnosis present

## 2023-11-01 DIAGNOSIS — Y842 Radiological procedure and radiotherapy as the cause of abnormal reaction of the patient, or of later complication, without mention of misadventure at the time of the procedure: Secondary | ICD-10-CM | POA: Diagnosis present

## 2023-11-01 DIAGNOSIS — R509 Fever, unspecified: Secondary | ICD-10-CM

## 2023-11-01 DIAGNOSIS — D701 Agranulocytosis secondary to cancer chemotherapy: Principal | ICD-10-CM | POA: Diagnosis present

## 2023-11-01 DIAGNOSIS — Z833 Family history of diabetes mellitus: Secondary | ICD-10-CM

## 2023-11-01 DIAGNOSIS — Z95828 Presence of other vascular implants and grafts: Secondary | ICD-10-CM

## 2023-11-01 DIAGNOSIS — D709 Neutropenia, unspecified: Secondary | ICD-10-CM | POA: Diagnosis not present

## 2023-11-01 DIAGNOSIS — C090 Malignant neoplasm of tonsillar fossa: Secondary | ICD-10-CM | POA: Diagnosis present

## 2023-11-01 DIAGNOSIS — Z8261 Family history of arthritis: Secondary | ICD-10-CM

## 2023-11-01 DIAGNOSIS — T451X5A Adverse effect of antineoplastic and immunosuppressive drugs, initial encounter: Secondary | ICD-10-CM | POA: Diagnosis present

## 2023-11-01 DIAGNOSIS — G893 Neoplasm related pain (acute) (chronic): Secondary | ICD-10-CM | POA: Diagnosis present

## 2023-11-01 DIAGNOSIS — Z79899 Other long term (current) drug therapy: Secondary | ICD-10-CM

## 2023-11-01 DIAGNOSIS — F4310 Post-traumatic stress disorder, unspecified: Secondary | ICD-10-CM | POA: Diagnosis present

## 2023-11-01 DIAGNOSIS — J45909 Unspecified asthma, uncomplicated: Secondary | ICD-10-CM | POA: Diagnosis present

## 2023-11-01 DIAGNOSIS — K1233 Oral mucositis (ulcerative) due to radiation: Secondary | ICD-10-CM

## 2023-11-01 DIAGNOSIS — C770 Secondary and unspecified malignant neoplasm of lymph nodes of head, face and neck: Secondary | ICD-10-CM | POA: Diagnosis present

## 2023-11-01 DIAGNOSIS — Z825 Family history of asthma and other chronic lower respiratory diseases: Secondary | ICD-10-CM

## 2023-11-01 DIAGNOSIS — Z8249 Family history of ischemic heart disease and other diseases of the circulatory system: Secondary | ICD-10-CM

## 2023-11-01 LAB — CBC WITH DIFFERENTIAL (CANCER CENTER ONLY)
Abs Immature Granulocytes: 0 10*3/uL (ref 0.00–0.07)
Basophils Absolute: 0 10*3/uL (ref 0.0–0.1)
Basophils Relative: 1 %
Eosinophils Absolute: 0 10*3/uL (ref 0.0–0.5)
Eosinophils Relative: 1 %
HCT: 35.5 % — ABNORMAL LOW (ref 39.0–52.0)
Hemoglobin: 12.2 g/dL — ABNORMAL LOW (ref 13.0–17.0)
Immature Granulocytes: 0 %
Lymphocytes Relative: 9 %
Lymphs Abs: 0.2 10*3/uL — ABNORMAL LOW (ref 0.7–4.0)
MCH: 30.7 pg (ref 26.0–34.0)
MCHC: 34.4 g/dL (ref 30.0–36.0)
MCV: 89.4 fL (ref 80.0–100.0)
Monocytes Absolute: 0.6 10*3/uL (ref 0.1–1.0)
Monocytes Relative: 33 %
Neutro Abs: 1 10*3/uL — ABNORMAL LOW (ref 1.7–7.7)
Neutrophils Relative %: 56 %
Platelet Count: 196 10*3/uL (ref 150–400)
RBC: 3.97 MIL/uL — ABNORMAL LOW (ref 4.22–5.81)
RDW: 12.2 % (ref 11.5–15.5)
WBC Count: 1.8 10*3/uL — ABNORMAL LOW (ref 4.0–10.5)
nRBC: 0 % (ref 0.0–0.2)

## 2023-11-01 LAB — URINALYSIS, COMPLETE (UACMP) WITH MICROSCOPIC
Bacteria, UA: NONE SEEN
Bilirubin Urine: NEGATIVE
Glucose, UA: 50 mg/dL — AB
Hgb urine dipstick: NEGATIVE
Ketones, ur: NEGATIVE mg/dL
Leukocytes,Ua: NEGATIVE
Nitrite: NEGATIVE
Protein, ur: 30 mg/dL — AB
Specific Gravity, Urine: 1.027 (ref 1.005–1.030)
pH: 7 (ref 5.0–8.0)

## 2023-11-01 LAB — CMP (CANCER CENTER ONLY)
ALT: 70 U/L — ABNORMAL HIGH (ref 0–44)
AST: 34 U/L (ref 15–41)
Albumin: 3.8 g/dL (ref 3.5–5.0)
Alkaline Phosphatase: 112 U/L (ref 38–126)
Anion gap: 5 (ref 5–15)
BUN: 13 mg/dL (ref 6–20)
CO2: 33 mmol/L — ABNORMAL HIGH (ref 22–32)
Calcium: 9.3 mg/dL (ref 8.9–10.3)
Chloride: 94 mmol/L — ABNORMAL LOW (ref 98–111)
Creatinine: 0.8 mg/dL (ref 0.61–1.24)
GFR, Estimated: >60 mL/min (ref >60)
Glucose, Bld: 145 mg/dL — ABNORMAL HIGH (ref 70–99)
Potassium: 4.3 mmol/L (ref 3.5–5.1)
Sodium: 132 mmol/L — ABNORMAL LOW (ref 135–145)
Total Bilirubin: 0.4 mg/dL (ref <1.2)
Total Protein: 7.2 g/dL (ref 6.5–8.1)

## 2023-11-01 LAB — RESP PANEL BY RT-PCR (RSV, FLU A&B, COVID)  RVPGX2
Influenza A by PCR: NEGATIVE
Influenza B by PCR: NEGATIVE
Resp Syncytial Virus by PCR: NEGATIVE
SARS Coronavirus 2 by RT PCR: NEGATIVE

## 2023-11-01 MED ORDER — ALBUTEROL SULFATE (2.5 MG/3ML) 0.083% IN NEBU: 2.5000 mg | INHALATION_SOLUTION | RESPIRATORY_TRACT | Status: DC | PRN

## 2023-11-01 MED ORDER — ONDANSETRON HCL 4 MG PO TABS: 4.0000 mg | ORAL_TABLET | Freq: Four times a day (QID) | ORAL | Status: DC | PRN

## 2023-11-01 MED ORDER — ACETAMINOPHEN 160 MG/5ML PO SOLN: 650.0000 mg | Freq: Once | ORAL | Status: DC

## 2023-11-01 MED ORDER — BACLOFEN 10 MG PO TABS
10.0000 mg | ORAL_TABLET | Freq: Three times a day (TID) | ORAL | Status: DC
Start: 2023-11-01 — End: 2023-11-05
  Administered 2023-11-01 – 2023-11-05 (×9): 10 mg via ORAL
  Filled 2023-11-01 (×10): qty 1

## 2023-11-01 MED ORDER — MORPHINE SULFATE (PF) 2 MG/ML IV SOLN
2.0000 mg | Freq: Once | INTRAVENOUS | Status: DC
Start: 2023-11-01 — End: 2023-11-01

## 2023-11-01 MED ORDER — SENNOSIDES-DOCUSATE SODIUM 8.6-50 MG PO TABS
1.0000 | ORAL_TABLET | Freq: Every evening | ORAL | Status: DC | PRN
  Administered 2023-11-03: 1 via ORAL
  Filled 2023-11-01 (×2): qty 1

## 2023-11-01 MED ORDER — ONDANSETRON HCL 4 MG/2ML IJ SOLN
4.0000 mg | Freq: Once | INTRAMUSCULAR | Status: AC
Start: 2023-11-01 — End: 2023-11-01
  Administered 2023-11-01: 4 mg via INTRAVENOUS
  Filled 2023-11-01: qty 2

## 2023-11-01 MED ORDER — SODIUM CHLORIDE 0.9 % IV SOLN
2.0000 g | Freq: Three times a day (TID) | INTRAVENOUS | Status: DC
  Administered 2023-11-01 – 2023-11-05 (×11): 2 g via INTRAVENOUS
  Filled 2023-11-01 (×11): qty 12.5

## 2023-11-01 MED ORDER — SODIUM CHLORIDE 0.9 % IV SOLN
2.0000 g | Freq: Once | INTRAVENOUS | Status: AC
  Administered 2023-11-01: 2 g via INTRAVENOUS
  Filled 2023-11-01: qty 12.5

## 2023-11-01 MED ORDER — PAROXETINE HCL ER 12.5 MG PO TB24: 12.5000 mg | ORAL_TABLET | Freq: Every day | ORAL | Status: DC

## 2023-11-01 MED ORDER — SODIUM CHLORIDE 0.9% FLUSH
10.0000 mL | Freq: Once | INTRAVENOUS | Status: AC
  Administered 2023-11-01: 10 mL

## 2023-11-01 MED ORDER — SCOPOLAMINE 1 MG/3DAYS TD PT72
1.0000 | MEDICATED_PATCH | TRANSDERMAL | Status: DC
  Administered 2023-11-01 – 2023-11-04 (×2): 1.5 mg via TRANSDERMAL
  Filled 2023-11-01 (×2): qty 1

## 2023-11-01 MED ORDER — FAMOTIDINE IN NACL 20-0.9 MG/50ML-% IV SOLN
20.0000 mg | Freq: Once | INTRAVENOUS | Status: AC
  Administered 2023-11-01: 20 mg via INTRAVENOUS
  Filled 2023-11-01: qty 50

## 2023-11-01 MED ORDER — OMEPRAZOLE 2 MG/ML ORAL SUSPENSION
20.0000 mg | Freq: Every day | ORAL | Status: DC
Start: 2023-11-01 — End: 2023-11-01

## 2023-11-01 MED ORDER — LIDOCAINE VISCOUS HCL 2 % MT SOLN
15.0000 mL | OROMUCOSAL | Status: DC | PRN
  Administered 2023-11-01 – 2023-11-03 (×3): 15 mL via OROMUCOSAL
  Filled 2023-11-01 (×4): qty 15

## 2023-11-01 MED ORDER — ACETAMINOPHEN 650 MG RE SUPP: 650.0000 mg | Freq: Four times a day (QID) | RECTAL | Status: DC | PRN

## 2023-11-01 MED ORDER — ONDANSETRON HCL 4 MG/2ML IJ SOLN: 4.0000 mg | Freq: Four times a day (QID) | INTRAMUSCULAR | Status: DC | PRN

## 2023-11-01 MED ORDER — ACETAMINOPHEN 325 MG PO TABS
650.0000 mg | ORAL_TABLET | Freq: Four times a day (QID) | ORAL | Status: DC | PRN
  Administered 2023-11-01: 650 mg via ORAL
  Filled 2023-11-01: qty 2

## 2023-11-01 MED ORDER — OSMOLITE 1.5 CAL PO LIQD
237.0000 mL | ORAL | Status: DC
  Administered 2023-11-01: 474 mL
  Administered 2023-11-01: 237 mL
  Filled 2023-11-01 (×3): qty 237

## 2023-11-01 MED ORDER — SODIUM CHLORIDE 0.9 % IV SOLN: Freq: Once | INTRAVENOUS | Status: AC

## 2023-11-01 MED ORDER — SUCRALFATE 1 G PO TABS
1.0000 g | ORAL_TABLET | Freq: Three times a day (TID) | ORAL | Status: DC
  Administered 2023-11-01 – 2023-11-02 (×2): 1 g via ORAL
  Filled 2023-11-01 (×2): qty 1

## 2023-11-01 MED ORDER — ONDANSETRON HCL 4 MG/2ML IJ SOLN
4.0000 mg | Freq: Once | INTRAMUSCULAR | Status: DC
Start: 2023-11-01 — End: 2023-11-01

## 2023-11-01 MED ORDER — TRAZODONE HCL 50 MG PO TABS
25.0000 mg | ORAL_TABLET | Freq: Every evening | ORAL | Status: DC | PRN
  Administered 2023-11-02: 25 mg via ORAL
  Filled 2023-11-01 (×2): qty 1

## 2023-11-01 MED ORDER — ENOXAPARIN SODIUM 40 MG/0.4ML IJ SOSY
40.0000 mg | PREFILLED_SYRINGE | INTRAMUSCULAR | Status: DC
  Administered 2023-11-01 – 2023-11-04 (×4): 40 mg via SUBCUTANEOUS
  Filled 2023-11-01 (×4): qty 0.4

## 2023-11-01 MED ORDER — MORPHINE SULFATE (PF) 2 MG/ML IV SOLN: 2.0000 mg | INTRAVENOUS | Status: DC | PRN

## 2023-11-01 MED ORDER — MORPHINE SULFATE (PF) 2 MG/ML IV SOLN
2.0000 mg | Freq: Once | INTRAVENOUS | Status: AC
  Administered 2023-11-01: 2 mg via INTRAVENOUS
  Filled 2023-11-01: qty 1

## 2023-11-01 MED ORDER — HYDROCODONE-ACETAMINOPHEN 7.5-325 MG/15ML PO SOLN
10.0000 mL | ORAL | Status: DC | PRN
  Administered 2023-11-02: 10 mL via ORAL
  Filled 2023-11-01: qty 15

## 2023-11-01 MED ORDER — MORPHINE SULFATE (PF) 2 MG/ML IV SOLN
2.0000 mg | Freq: Once | INTRAVENOUS | Status: AC
Start: 2023-11-01 — End: 2023-11-01
  Administered 2023-11-01: 2 mg via INTRAVENOUS
  Filled 2023-11-01: qty 1

## 2023-11-01 MED ORDER — FAMOTIDINE IN NACL 20-0.9 MG/50ML-% IV SOLN: 20.0000 mg | Freq: Once | INTRAVENOUS | Status: DC

## 2023-11-01 MED ORDER — ACETAMINOPHEN 160 MG/5ML PO SOLN
650.0000 mg | Freq: Once | ORAL | Status: AC
  Administered 2023-11-01: 650 mg
  Filled 2023-11-01: qty 20.3

## 2023-11-01 MED ORDER — HYDROMORPHONE HCL 1 MG/ML IJ SOLN
1.0000 mg | INTRAMUSCULAR | Status: DC | PRN
  Administered 2023-11-01 – 2023-11-04 (×21): 1 mg via INTRAVENOUS
  Filled 2023-11-01 (×21): qty 1

## 2023-11-01 NOTE — Plan of Care (Signed)
  Problem: Education: Goal: Knowledge of General Education information will improve Description Including pain rating scale, medication(s)/side effects and non-pharmacologic comfort measures Outcome: Progressing   

## 2023-11-01 NOTE — Progress Notes (Signed)
Pt will be transported to [facility] for further evaluation inpatient. Report was called and given to [facility]. Wife is at beside. Pt is stable.

## 2023-11-01 NOTE — Progress Notes (Signed)
Symptom Management Consult Note [facility]    Patient Care Team: [name], MD as PCP - General (Family Medicine) Noe Gens, MD as Referring Physician (Otolaryngology) [name], MD as Attending Physician (Radiation Oncology) [name], [name], RN as Oncology Nurse Navigator [name], MD as Consulting Physician (Oncology)    Name / MRN / DOBThurlo Williams  [id number]  Jan 22, 1981   Date of visit: 11/01/2023   Chief Complaint/Reason for visit: fever   Current Therapy: Cisplatin with concurrent radiation  Last treatment:  Day 1   Cycle 4 on 10/21/23   ASSESSMENT & PLAN: Patient is a 42 y.o. 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 [name].  I have viewed most recent oncology note and lab work.    #Malignant neoplasm of tonsillar fossa  - Per chart review treatment was held last week 2/2 cytopenias and limited infusion center availability with the holiday. - Next appointment with oncologist is 11/03/23   #Neutropenic Fever -Patient is ill appearing although non toxic. He has low grade temp 100.3. -Oral exam with signs of mucositis. Grade 3. -Chest xray without signs of acute infectious process -UA negative for infection. Urine culture in process.  CBC showing WBC 1.8 and ANC 1.0. CMP overall unremarkable. Covid test and blood cultures collected -Tylenol administered for fever. -No obvious source of fever. 2g IV cefepime administered in clinic for broad coverage.  #Cancer related pain -Radiation induced mucositis likely causing most of his pain per HPI. Not well controlled on Norco so oncologist prescribed Hycet, although patient has not yet tried it. -Patient received 100 ml NS, 4g IV morphine in clinic for pain control with IV Pepcid and Zofran as well. -Encouraged patient to discuss pain management during admission.   Discussed with Dr. [name] who agrees with plan for direct  admission. Spoke with Dr. Robb Matar with hospitalist service who agrees to assume care of patient and bring into the hospital for further evaluation and management.      Heme/Onc History: Oncology History  Malignant neoplasm of tonsillar fossa (HCC)  07/20/2023 Imaging   CT neck soft tissue: 2.5 x 3 cm mass in the left tonsillar region with extension towards the tongue base, suspicious for carcinoma, and evidence of level 2 lymphadenopathy on the left, consisting of a dominant node measuring 3 x 2 x 1.7 cm. A benign appearing osteoma was also demonstrated, projecting inferior from the angle of the left mandible.    08/26/2023 Initial Diagnosis   Malignant neoplasm of tonsillar fossa [facility]):  He presented to his PCP for routine follow-up in July 2024 and was incidentally noted to have left submandibular swelling on a routine examination performed at that time. A soft tissue head and neck ultrasound was subsequently performed on 07/07/23 which showed an ovoid mass measuring 2.8 x 1.9 cm, most consistent with an abnormally enlarged lymph node, and correlating with the palpable area in the left submandibular region.    A soft tissue neck CT was also performed on 07/20/23 which demonstrated: a  2.5 x 3 cm mass in the left tonsillar region with extension towards the tongue base, suspicious for carcinoma, and evidence of level 2 lymphadenopathy on the left, consisting of a dominant node measuring 3 x 2 x 1.7 cm. A benign appearing osteoma was also demonstrated, projecting inferior from the angle of the left mandible.   Subsequently, the patient was referred to Dr. Roma Schanz at [facility] and  underwent biopsies of the left tonsil on 08/26/23. 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    08/26/2023 Pathology Results   Patient was referred to Dr. Roma Schanz at [facility] and underwent biopsies of the left tonsil on 08/26/23. 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    09/07/2023 PET scan   PET scan performed on 09/07/23 at [facility]: intense radiotracer uptake within the left palatine tonsillar mass; adjacent radiotracer uptake within the region of the right palatine tonsil; and hypermetabolic nodes in the left neck extending to the supraclavicular region concerning for nodal metastases. Focal uptake at the anorectal junction was also demonstrated, possibly reflecting muscular contraction, however local malignancy can not be excluded. PET otherwise showed no evidence of more distant metastatic disease.    09/14/2023 Cancer Staging   Staging form: Pharynx - HPV-Mediated Oropharynx, AJCC 8th Edition - Clinical stage from 09/14/2023: Stage I (cT2, cN1, cM0, p16+) - Signed by [name], MD on 09/14/2023 Stage prefix: Initial diagnosis   09/29/2023 -  Chemotherapy   Patient is on Treatment Plan : HEAD/NECK Cisplatin (40) q7d         Interval history-: Discussed the use of AI scribe software for clinical note transcription with the patient, who gave verbal consent to proceed.   Harry Williams is a 42 y.o. male with oncologic history as above presenting to [facility] today with chief complaint of fever. Patient is accompanied by spouse who provides additional history.  Spouse reports patient has been experiencing intermittent fever x 4 days, with temperatures reaching up to 101.77F despite regular administration of hydrocodone and Tylenol. Urinary frequency has decreased, with the patient reporting only two to three instances of urination per day. Denies any dysuria. The patient has also developed a new cough, associated with the increased secretions.  The increased secretions have led to difficulty swallowing and necessitating complete reliance on a feeding tube for nutrition and medication administration. The patient reports severe oral and throat pain, which has been poorly controlled with  hydrocodone 10/325mg  every six hours, often requiring administration before the four-hour mark. A liquid formulation of hydrocodone was recently prescribed but has not yet been started because the pharmacy did not have it in stock.  Over the weekend, there was a small amount of drainage noted around the feeding tube, which has since resolved. The patient has been maintaining hydration and nutrition through the feeding tube, consuming approximately seven Osmolite feeds and six 16.9oz bottles of water daily.  In addition to these symptoms, the patient has been experiencing hallucinations, which began around the same time as the fever. These hallucinations have been vivid and include both visual and auditory components. The patient denies any recent falls or head injuries. The patient denies any other signs of infection, such as wounds or skin lesions. He denies any sick contacts.  The patient's oral mucositis has been managed with baking soda, salt, lidocaine, and Sucralfate. Despite these treatments, the patient continues to experience significant discomfort and difficulty swallowing. The patient's pain is described as burning and aching, rating it as an 8-9 out of 10 on the pain scale.  ROS  All other systems are reviewed and are negative for acute change except as noted in the HPI.    No Known Allergies   Past Medical History:  Diagnosis Date   Anxiety    Asthma    Avascular necrosis of hip, left (HCC) 07/24/2021   Bright  red blood per rectum 12/16/2022   Closed fracture of nasal bone with routine healing 12/24/2016   Depression    PTSD (post-traumatic stress disorder)      Past Surgical History:  Procedure Laterality Date   APPENDECTOMY     COLON SURGERY  03/30/2015   IR GASTROSTOMY TUBE MOD SED  09/22/2023   IR IMAGING GUIDED PORT INSERTION  09/22/2023   JOINT REPLACEMENT  2008   partial right elbow; right femoral graft   Total abdominal proctocolectomy, J-pouch, ileal pouch  anal anastomosis  2016    Social History   Socioeconomic History   Marital status: Married    Spouse name: Teacher, English as a foreign language   Number of children: 3   Years of education: 12+   Highest education level: Some college, no degree  Occupational History   Not on file  Tobacco Use   Smoking status: Never   Smokeless tobacco: Never  Vaping Use   Vaping status: Never Used  Substance and Sexual Activity   Alcohol use: Yes    Comment: Occasional   Drug use: Never   Sexual activity: Yes    Partners: Female  Other Topics Concern   Not on file  Social History Narrative   ** Merged History Encounter **       Social Determinants of Health   Financial Resource Strain: Not on file  Food Insecurity: No Food Insecurity (04/01/2022)   Hunger Vital Sign    Worried About Running Out of Food in the Last Year: Never true    Ran Out of Food in the Last Year: Never true  Transportation Needs: No Transportation Needs (04/01/2022)   PRAPARE - Administrator, Civil Service (Medical): No    Lack of Transportation (Non-Medical): No  Physical Activity: Not on file  Stress: Not on file  Social Connections: Not on file  Intimate Partner Violence: Not At Risk (04/01/2022)   Humiliation, Afraid, Rape, and Kick questionnaire    Fear of Current or Ex-Partner: No    Emotionally Abused: No    Physically Abused: No    Sexually Abused: No    Family History  Problem Relation Age of Onset   Arthritis Mother    Anemia Mother    Diabetes Father    Hypertension Brother    Asthma Daughter    Heart disease Paternal Grandmother    Cancer Paternal Grandfather      Current Outpatient Medications:    baclofen (LIORESAL) 10 MG tablet, Take 1 tablet (10 mg total) by mouth 3 (three) times daily., Disp: 30 each, Rfl: 0   celecoxib (CELEBREX) 200 MG capsule, Take 200 mg by mouth daily as needed for moderate pain (pain score 4-6)., Disp: , Rfl:    HYDROcodone-acetaminophen (HYCET) 7.5-325 mg/15 ml solution,  Take 10 mLs by mouth every 4 (four) hours as needed for moderate pain (pain score 4-6)., Disp: 500 mL, Rfl: 0   HYDROcodone-acetaminophen (NORCO) 10-325 MG tablet, Take 1 tablet by mouth every 6 (six) hours as needed for severe pain (pain score 7-10) or moderate pain (pain score 4-6)., Disp: 60 tablet, Rfl: 0   lidocaine (XYLOCAINE) 2 % solution, Patient: Mix 1part 2% viscous lidocaine, 1part H20. Swish & swallow 10mL of diluted mixture, before meals and at bedtime, up to QID, Disp: 200 mL, Rfl: 3   lidocaine-prilocaine (EMLA) cream, Apply to affected area once, Disp: 30 g, Rfl: 3   Nutritional Supplements (FEEDING SUPPLEMENT, OSMOLITE 1.5 CAL,) LIQD, 7 cartons Osmolite 1.5 split  over four feeding daily. Flush tube with 60 ml water before and after each bolus. Provide additional 3 1/2 cups (830 ml) water/day to meet hydration needs. Provides 2485 kcal, 104.3 g protein, 1267 ml free water from formula (2577 ml total water with flushes). 1659 ml/day meets 100% RDI, Disp: , Rfl:    omeprazole (KONVOMEP) 2 mg/mL SUSP oral suspension, Place 10 mLs (20 mg total) into feeding tube daily., Disp: 300 mL, Rfl: 2   ondansetron (ZOFRAN) 8 MG tablet, Take 1 tablet (8 mg total) by mouth every 8 (eight) hours as needed for nausea or vomiting. Start on the third day after cisplatin. (Patient not taking: Reported on 10/26/2023), Disp: 30 tablet, Rfl: 1   PARoxetine (PAXIL CR) 12.5 MG 24 hr tablet, Take 1 tablet (12.5 mg total) by mouth daily., Disp: 30 tablet, Rfl: 0   predniSONE (DELTASONE) 20 MG tablet, Take 4 tablets by mouth once daily (80 mg dose) for 7 days, followed by 3 tablets by mouth once daily, continue until further instructions, Disp: 60 tablet, Rfl: 1   prochlorperazine (COMPAZINE) 10 MG tablet, Take 1 tablet (10 mg total) by mouth every 6 (six) hours as needed (Nausea or vomiting). (Patient not taking: Reported on 10/26/2023), Disp: 30 tablet, Rfl: 1   scopolamine (TRANSDERM-SCOP) 1 MG/3DAYS, Place 1  patch (1.5 mg total) onto the skin every 3 (three) days., Disp: 10 patch, Rfl: 2   sucralfate (CARAFATE) 1 g tablet, Dissolve 1 tablet in 10 mL H20 and swallow 30 min prior to meals and bedtime., Disp: 40 tablet, Rfl: 4   Vitamin D, Ergocalciferol, (DRISDOL) 1.25 MG (50000 UNIT) CAPS capsule, Take 1 capsule (50,000 Units total) by mouth every 7 (seven) days. Take for 8 total doses(weeks), Disp: 8 capsule, Rfl: 0 No current facility-administered medications for this visit.  Facility-Administered Medications Ordered in Other Visits:    ceFEPIme (MAXIPIME) 2 g in sodium chloride 0.9 % 100 mL IVPB, 2 g, Intravenous, Once, Walisiewicz, Parv Manthey E, PA-C, Last Rate: 200 mL/hr at 11/01/23 1440, 2 g at 11/01/23 1440  PHYSICAL EXAM: ECOG FS:1 - Symptomatic but completely ambulatory    Vitals:   11/01/23 1054  BP: (!) 146/95  Pulse: 97  Resp: 17  Temp: 100.3 F (37.9 C)  TempSrc: Temporal  SpO2: 98%  Weight: 171 lb 6.4 oz (77.7 kg)   Physical Exam Vitals and nursing note reviewed.  Constitutional:      General: He is not in acute distress.    Appearance: He is ill-appearing. He is not toxic-appearing.  HENT:     Head: Normocephalic.     Mouth/Throat:     Comments: Mucositis of oropharynx Eyes:     Conjunctiva/sclera: Conjunctivae normal.  Neck:     Comments: Peeling skin on neck Cardiovascular:     Rate and Rhythm: Normal rate and regular rhythm.     Pulses: Normal pulses.     Heart sounds: Normal heart sounds.  Pulmonary:     Effort: Pulmonary effort is normal.     Breath sounds: Normal breath sounds.  Chest:     Comments: PAC in right upper chest without signs of infection Abdominal:     General: There is no distension.     Comments: PEG tube dressing removed and no surrounding erythema or purulent drainage.  No significant surrounding tenderness  Musculoskeletal:     Cervical back: Normal range of motion.  Skin:    General: Skin is warm and dry.  Neurological:  Mental  Status: He is alert.        LABORATORY DATA: I have reviewed the data as listed    Latest Ref Rng & Units 11/01/2023   10:24 AM 10/26/2023    8:14 AM 10/20/2023    3:20 PM  CBC  WBC 4.0 - 10.5 K/uL 1.8  2.9  4.0   Hemoglobin 13.0 - 17.0 g/dL 40.9  81.1  91.4   Hematocrit 39.0 - 52.0 % 35.5  39.0  39.1   Platelets 150 - 400 K/uL 196  116  168         Latest Ref Rng & Units 11/01/2023   10:24 AM 10/26/2023    8:14 AM 10/20/2023    3:20 PM  CMP  Glucose 70 - 99 mg/dL 782  956  86   BUN 6 - 20 mg/dL 13  14  18    Creatinine 0.61 - 1.24 mg/dL 2.13  0.86  5.78   Sodium 135 - 145 mmol/L 132  133  133   Potassium 3.5 - 5.1 mmol/L 4.3  4.2  4.6   Chloride 98 - 111 mmol/L 94  98  99   CO2 22 - 32 mmol/L 33  30  28   Calcium 8.9 - 10.3 mg/dL 9.3  9.2  9.0   Total Protein 6.5 - 8.1 g/dL 7.2     Total Bilirubin <1.2 mg/dL 0.4     Alkaline Phos 38 - 126 U/L 112     AST 15 - 41 U/L 34     ALT 0 - 44 U/L 70          RADIOGRAPHIC STUDIES (from last 24 hours if applicable) I have personally reviewed the radiological images as listed and agreed with the findings in the report. DG Chest 2 View  Result Date: 11/01/2023 CLINICAL DATA:  Fever. EXAM: CHEST - 2 VIEW COMPARISON:  02/14/2019 FINDINGS: Interval Port-A-Cath placement with catheter tip in the distal SVC. The heart size and mediastinal contours are within normal limits. There may be some mild bilateral perihilar bronchial thickening, especially on the lateral view. This may be consistent with bronchitis. There is no evidence of pulmonary edema, consolidation, pneumothorax, nodule or pleural fluid. The visualized skeletal structures are unremarkable. IMPRESSION: Possible mild perihilar bronchial thickening bilaterally which may be consistent with acute bronchitis. Electronically Signed   By: Irish Lack M.D.   On: 11/01/2023 10:59        Visit Diagnosis: 1. Malignant neoplasm of tonsillar fossa (HCC)   2. Neutropenic fever  (HCC)   3. Cancer associated pain      Orders Placed This Encounter  Procedures   Resp panel by RT-PCR (RSV, Flu A&B, Covid) Anterior Nasal Swab   Urinalysis, Complete w Microscopic    Standing Status:   Future    Number of Occurrences:   1    Standing Expiration Date:   10/31/2024    All questions were answered. The patient knows to call the clinic with any problems, questions or concerns. No barriers to learning was detected.  A total of more than 40 minutes were spent on this encounter with face-to-face time and non-face-to-face time, including preparing to see the patient, ordering tests and/or medications, counseling the patient and coordination of care as outlined above.    Thank you for allowing me to participate in the care of this patient.    Shanon Ace, PA-C Department of Hematology/Oncology [facility] Phone:  [phone]  [phone]    11/01/2023 2:59 PM

## 2023-11-01 NOTE — Plan of Care (Addendum)
Patient is alert and oriented X4, admitted to 1603, awaiting doctors orders. Wife at the bedside and patient stable in recliner. IV Dilaudid administered for 9/10 oral pain, awaiting for pharmacy verification.  Problem: Education: Goal: Knowledge of General Education information will improve Description: Including pain rating scale, medication(s)/side effects and non-pharmacologic comfort measures Outcome: Progressing   Problem: Health Behavior/Discharge Planning: Goal: Ability to manage health-related needs will improve Outcome: Progressing   Problem: Clinical Measurements: Goal: Ability to maintain clinical measurements within normal limits will improve Outcome: Progressing Goal: Will remain free from infection Outcome: Progressing Goal: Diagnostic test results will improve Outcome: Progressing Goal: Respiratory complications will improve Outcome: Progressing Goal: Cardiovascular complication will be avoided Outcome: Progressing   Problem: Activity: Goal: Risk for activity intolerance will decrease Outcome: Progressing   Problem: Nutrition: Goal: Adequate nutrition will be maintained Outcome: Progressing   Problem: Coping: Goal: Level of anxiety will decrease Outcome: Progressing   Problem: Elimination: Goal: Will not experience complications related to bowel motility Outcome: Progressing Goal: Will not experience complications related to urinary retention Outcome: Progressing   Problem: Pain Management: Goal: General experience of comfort will improve Outcome: Progressing   Problem: Safety: Goal: Ability to remain free from injury will improve Outcome: Progressing   Problem: Skin Integrity: Goal: Risk for impaired skin integrity will decrease Outcome: Progressing

## 2023-11-01 NOTE — H&P (Addendum)
History and Physical  [name]:[id number] DOB: 11/05/1981 DOA: 11/01/2023  PCP: [name], MD   Chief Complaint: Fever  HPI: Harry Williams is a 42 y.o. male with medical history significant for malignant neoplasm of tonsillar fossa being admitted to the hospital with neutropenic fever.  History provided mainly by the patient's wife, as he is currently quite uncomfortable, though he does corroborate what she tells me.  They say that he has been tolerating his chemotherapy, over the last week however he has had worsening oral pain, felt to be due to mucositis.  Also over the last 4 days, he has been having intermittent fevers, sweats, and subjective chills.  Has a low-grade temperature today in the oncology clinic 100.3.  He was directly admitted from oncology clinic today, for concern for neutropenic fever.  Patient has no obvious source of fever, denies cough, shortness of breath, nausea, vomiting, diarrhea, dysuria.  No rashes on the skin.  He was given 4 mg IV morphine for pain in the oncology clinic, as well as IV Pepcid, IV Zofran, and 2 g IV cefepime.  Blood and urine cultures as well as respiratory swab were collected in clinic as well.  Says the pain was only marginally improved with IV morphine.  Review of Systems: Please see HPI for pertinent positives and negatives. A complete 10 system review of systems are otherwise negative.  Past Medical History:  Diagnosis Date   Anxiety    Asthma    Avascular necrosis of hip, left (HCC) 07/24/2021   Bright red blood per rectum 12/16/2022   Closed fracture of nasal bone with routine healing 12/24/2016   Depression    PTSD (post-traumatic stress disorder)    Past Surgical History:  Procedure Laterality Date   APPENDECTOMY     COLON SURGERY  03/30/2015   IR GASTROSTOMY TUBE MOD SED  09/22/2023   IR IMAGING GUIDED PORT INSERTION  09/22/2023   JOINT REPLACEMENT  2008   partial right elbow; right femoral graft   Total  abdominal proctocolectomy, J-pouch, ileal pouch anal anastomosis  2016    Social History:  reports that he has never smoked. He has never used smokeless tobacco. He reports current alcohol use. He reports that he does not use drugs.   No Known Allergies  Family History  Problem Relation Age of Onset   Arthritis Mother    Anemia Mother    Diabetes Father    Hypertension Brother    Asthma Daughter    Heart disease Paternal Grandmother    Cancer Paternal Grandfather      Prior to Admission medications   Medication Sig Start Date End Date Taking? Authorizing Provider  baclofen (LIORESAL) 10 MG tablet Take 1 tablet (10 mg total) by mouth 3 (three) times daily. 10/04/23   Shanon Ace, PA-C  celecoxib (CELEBREX) 200 MG capsule Take 200 mg by mouth daily as needed for moderate pain (pain score 4-6).    [provider]  HYDROcodone-acetaminophen (HYCET) 7.5-325 mg/15 ml solution Take 10 mLs by mouth every 4 (four) hours as needed for moderate pain (pain score 4-6). 10/31/23   Pasam, [name], MD  HYDROcodone-acetaminophen (NORCO) 10-325 MG tablet Take 1 tablet by mouth every 6 (six) hours as needed for severe pain (pain score 7-10) or moderate pain (pain score 4-6). 10/26/23   Pasam, [name], MD  lidocaine (XYLOCAINE) 2 % solution Patient: Mix 1part 2% viscous lidocaine, 1part H20. Swish & swallow 10mL of diluted mixture, before meals and  at bedtime, up to QID 10/10/23   [name], MD  lidocaine-prilocaine (EMLA) cream Apply to affected area once 09/26/23   Pasam, [name], MD  Nutritional Supplements (FEEDING SUPPLEMENT, OSMOLITE 1.5 CAL,) LIQD 7 cartons Osmolite 1.5 split over four feeding daily. Flush tube with 60 ml water before and after each bolus. Provide additional 3 1/2 cups (830 ml) water/day to meet hydration needs. Provides 2485 kcal, 104.3 g protein, 1267 ml free water from formula (2577 ml total water with flushes). 1659 ml/day meets 100% RDI 10/18/23    [name], MD  omeprazole (KONVOMEP) 2 mg/mL SUSP oral suspension Place 10 mLs (20 mg total) into feeding tube daily. 10/31/23   [name], MD  ondansetron (ZOFRAN) 8 MG tablet Take 1 tablet (8 mg total) by mouth every 8 (eight) hours as needed for nausea or vomiting. Start on the third day after cisplatin. 09/26/23   Pasam, [name], MD  PARoxetine (PAXIL CR) 12.5 MG 24 hr tablet Take 1 tablet (12.5 mg total) by mouth daily. 08/30/23   Thresa Ross, MD  predniSONE (DELTASONE) 20 MG tablet Take 4 tablets by mouth once daily (80 mg dose) for 7 days, followed by 3 tablets by mouth once daily, continue until further instructions 10/05/23   Pasam, [name], MD  prochlorperazine (COMPAZINE) 10 MG tablet Take 1 tablet (10 mg total) by mouth every 6 (six) hours as needed (Nausea or vomiting). 09/26/23   Pasam, [name], MD  scopolamine (TRANSDERM-SCOP) 1 MG/3DAYS Place 1 patch (1.5 mg total) onto the skin every 3 (three) days. 10/31/23   [name], MD  sucralfate (CARAFATE) 1 g tablet Dissolve 1 tablet in 10 mL H20 and swallow 30 min prior to meals and bedtime. 10/11/23   [name], MD  Vitamin D, Ergocalciferol, (DRISDOL) 1.25 MG (50000 UNIT) CAPS capsule Take 1 capsule (50,000 Units total) by mouth every 7 (seven) days. Take for 8 total doses(weeks) 06/19/23   [name], MD    Physical Exam: BP (!) 152/89 (BP Location: Left Arm)   Pulse 96   Temp 99.4 F (37.4 C) (Oral)   Resp 20   SpO2 97%   General:  Alert, oriented, calm, to be in mild to rest, but not acutely.  Looks nontoxic, his wife is at the bedside. Cardiovascular: RRR, no murmurs or rubs, no peripheral edema  Right anterior chest with port in place, nontender, no erythema surrounding. Respiratory: clear to auscultation bilaterally, no wheezes, no crackles  Abdomen: soft, nontender, some voluntary guarding, nondistended, gastrostomy tube in place no surrounding erythema, tenderness or drainage, normal bowel tones heard   Skin: dry, no rashes  Musculoskeletal: no joint effusions, normal range of motion  Psychiatric: appropriate affect, normal speech  Neurologic: extraocular muscles intact, clear speech, moving all extremities with intact sensorium         Labs on Admission:  Basic Metabolic Panel: Recent Labs  Lab 10/26/23 0814 11/01/23 1024  NA 133* 132*  K 4.2 4.3  CL 98 94*  CO2 30 33*  GLUCOSE 199* 145*  BUN 14 13  CREATININE 1.01 0.80  CALCIUM 9.2 9.3   Liver Function Tests: Recent Labs  Lab 11/01/23 1024  AST 34  ALT 70*  ALKPHOS 112  BILITOT 0.4  PROT 7.2  ALBUMIN 3.8   No results for input(s): "LIPASE", "AMYLASE" in the last 168 hours. No results for input(s): "AMMONIA" in the last 168 hours. CBC: Recent Labs  Lab 10/26/23 0814 11/01/23 1024  WBC 2.9* 1.8*  NEUTROABS  2.4 1.0*  HGB 13.8 12.2*  HCT 39.0 35.5*  MCV 89.4 89.4  PLT 116* 196   Cardiac Enzymes: No results for input(s): "CKTOTAL", "CKMB", "CKMBINDEX", "TROPONINI" in the last 168 hours.  BNP (last 3 results) No results for input(s): "BNP" in the last 8760 hours.  ProBNP (last 3 results) No results for input(s): "PROBNP" in the last 8760 hours.  CBG: No results for input(s): "GLUCAP" in the last 168 hours.  Radiological Exams on Admission: DG Chest 2 View  Result Date: 11/01/2023 CLINICAL DATA:  Fever. EXAM: CHEST - 2 VIEW COMPARISON:  02/14/2019 FINDINGS: Interval Port-A-Cath placement with catheter tip in the distal SVC. The heart size and mediastinal contours are within normal limits. There may be some mild bilateral perihilar bronchial thickening, especially on the lateral view. This may be consistent with bronchitis. There is no evidence of pulmonary edema, consolidation, pneumothorax, nodule or pleural fluid. The visualized skeletal structures are unremarkable. IMPRESSION: Possible mild perihilar bronchial thickening bilaterally which may be consistent with acute bronchitis. Electronically Signed    By: Irish Lack M.D.   On: 11/01/2023 10:59    Assessment/Plan This is an unfortunate and previously healthy 42 year old male with a history of recently diagnosed malignant neoplasm of the tonsillar fossa currently undergoing chemotherapy, and being admitted with neutropenic fever.  Neutropenic fever-nontoxic, not septic, no obvious source of fever found.  He does have significant mucositis.  Urinalysis unremarkable, urine culture, blood cultures, respiratory panel pending. -Observation admission -Follow-up cultures as above -Continue empiric 2 g IV cefepime every 8 hours -Neutropenic precautions  Cancer related pain-most likely from his mucositis, no significant improvement with IV morphine -Lidocaine viscous solution as needed -IV Dilaudid as needed  Neutropenia-presumably due to chemotherapy, monitor with daily labs  Malignant neoplasm of the tonsillar fossa-under the care of Dr. [name]  DVT prophylaxis: Lovenox     Code Status: Full Code  Consults called: His oncologist Dr. [name] added to treatment team  Admission status: Observation  Time spent: 46 minutes  Gabby Rackers Sharlette Dense MD [facility] Pager [phone]  If 7PM-7AM, please contact night-coverage [email or web address] Password [email or web address]  11/01/2023, 5:06 PM

## 2023-11-02 ENCOUNTER — Other Ambulatory Visit: Payer: Self-pay

## 2023-11-02 ENCOUNTER — Ambulatory Visit
Admission: RE | Admit: 2023-11-02 | Discharge: 2023-11-02 | Disposition: A | Discharge status: Home / Self Care | Payer: BC Managed Care – PPO | Source: Ambulatory Visit | Attending: Radiation Oncology | Admitting: Radiation Oncology

## 2023-11-02 ENCOUNTER — Inpatient Hospital Stay (HOSPITAL_COMMUNITY): Admit: 2023-11-02 | Payer: BC Managed Care – PPO | Admitting: Internal Medicine

## 2023-11-02 DIAGNOSIS — Z833 Family history of diabetes mellitus: Secondary | ICD-10-CM | POA: Diagnosis not present

## 2023-11-02 DIAGNOSIS — K1233 Oral mucositis (ulcerative) due to radiation: Secondary | ICD-10-CM | POA: Diagnosis present

## 2023-11-02 DIAGNOSIS — F4310 Post-traumatic stress disorder, unspecified: Secondary | ICD-10-CM | POA: Diagnosis present

## 2023-11-02 DIAGNOSIS — C770 Secondary and unspecified malignant neoplasm of lymph nodes of head, face and neck: Secondary | ICD-10-CM | POA: Diagnosis present

## 2023-11-02 DIAGNOSIS — T451X5A Adverse effect of antineoplastic and immunosuppressive drugs, initial encounter: Secondary | ICD-10-CM | POA: Diagnosis present

## 2023-11-02 DIAGNOSIS — R5081 Fever presenting with conditions classified elsewhere: Secondary | ICD-10-CM | POA: Diagnosis present

## 2023-11-02 DIAGNOSIS — Z8261 Family history of arthritis: Secondary | ICD-10-CM | POA: Diagnosis not present

## 2023-11-02 DIAGNOSIS — Z79899 Other long term (current) drug therapy: Secondary | ICD-10-CM | POA: Diagnosis not present

## 2023-11-02 DIAGNOSIS — D701 Agranulocytosis secondary to cancer chemotherapy: Secondary | ICD-10-CM | POA: Diagnosis present

## 2023-11-02 DIAGNOSIS — D709 Neutropenia, unspecified: Secondary | ICD-10-CM | POA: Diagnosis not present

## 2023-11-02 DIAGNOSIS — J45909 Unspecified asthma, uncomplicated: Secondary | ICD-10-CM | POA: Diagnosis present

## 2023-11-02 DIAGNOSIS — Y842 Radiological procedure and radiotherapy as the cause of abnormal reaction of the patient, or of later complication, without mention of misadventure at the time of the procedure: Secondary | ICD-10-CM | POA: Diagnosis present

## 2023-11-02 DIAGNOSIS — G893 Neoplasm related pain (acute) (chronic): Secondary | ICD-10-CM | POA: Diagnosis present

## 2023-11-02 DIAGNOSIS — Z825 Family history of asthma and other chronic lower respiratory diseases: Secondary | ICD-10-CM | POA: Diagnosis not present

## 2023-11-02 DIAGNOSIS — Z8249 Family history of ischemic heart disease and other diseases of the circulatory system: Secondary | ICD-10-CM | POA: Diagnosis not present

## 2023-11-02 DIAGNOSIS — C090 Malignant neoplasm of tonsillar fossa: Secondary | ICD-10-CM | POA: Diagnosis present

## 2023-11-02 LAB — RAD ONC ARIA SESSION SUMMARY
Course Elapsed Days: 36
Plan Fractions Treated to Date: 25
Plan Prescribed Dose Per Fraction: 2 Gy
Plan Total Fractions Prescribed: 35
Plan Total Prescribed Dose: 70 Gy
Reference Point Dosage Given to Date: 50 Gy
Reference Point Session Dosage Given: 2 Gy
Session Number: 25

## 2023-11-02 LAB — BASIC METABOLIC PANEL
Anion gap: 8 (ref 5–15)
BUN: 15 mg/dL (ref 6–20)
CO2: 28 mmol/L (ref 22–32)
Calcium: 8.7 mg/dL — ABNORMAL LOW (ref 8.9–10.3)
Chloride: 97 mmol/L — ABNORMAL LOW (ref 98–111)
Creatinine, Ser: 0.81 mg/dL (ref 0.61–1.24)
GFR, Estimated: >60 mL/min (ref >60)
Glucose, Bld: 169 mg/dL — ABNORMAL HIGH (ref 70–99)
Potassium: 4.1 mmol/L (ref 3.5–5.1)
Sodium: 133 mmol/L — ABNORMAL LOW (ref 135–145)

## 2023-11-02 LAB — CBC
HCT: 34 % — ABNORMAL LOW (ref 39.0–52.0)
Hemoglobin: 11.6 g/dL — ABNORMAL LOW (ref 13.0–17.0)
MCH: 31 pg (ref 26.0–34.0)
MCHC: 34.1 g/dL (ref 30.0–36.0)
MCV: 90.9 fL (ref 80.0–100.0)
Platelets: 190 10*3/uL (ref 150–400)
RBC: 3.74 MIL/uL — ABNORMAL LOW (ref 4.22–5.81)
RDW: 12.3 % (ref 11.5–15.5)
WBC: 2.1 10*3/uL — ABNORMAL LOW (ref 4.0–10.5)
nRBC: 0 % (ref 0.0–0.2)

## 2023-11-02 LAB — URINE CULTURE: Culture: NO GROWTH

## 2023-11-02 MED ORDER — OSMOLITE 1.5 CAL PO LIQD
474.0000 mL | Freq: Three times a day (TID) | ORAL | Status: DC
  Administered 2023-11-02: 474 mL
  Filled 2023-11-02: qty 474

## 2023-11-02 MED ORDER — HYDROCODONE-ACETAMINOPHEN 7.5-325 MG/15ML PO SOLN
10.0000 mL | Freq: Four times a day (QID) | ORAL | Status: DC
  Administered 2023-11-02 – 2023-11-05 (×11): 10 mL via ORAL
  Filled 2023-11-02 (×11): qty 15

## 2023-11-02 MED ORDER — GLYCOPYRROLATE 0.2 MG/ML IJ SOLN
0.1000 mg | Freq: Once | INTRAMUSCULAR | Status: AC
  Administered 2023-11-02: 0.1 mg via INTRAVENOUS
  Filled 2023-11-02: qty 1

## 2023-11-02 MED ORDER — CHLORHEXIDINE GLUCONATE CLOTH 2 % EX PADS
6.0000 | MEDICATED_PAD | Freq: Every day | CUTANEOUS | Status: DC
  Administered 2023-11-02 – 2023-11-04 (×3): 6 via TOPICAL

## 2023-11-02 MED ORDER — OSMOLITE 1.5 CAL PO LIQD
474.0000 mL | Freq: Four times a day (QID) | ORAL | Status: DC
  Administered 2023-11-02 – 2023-11-05 (×11): 474 mL
  Filled 2023-11-02 (×13): qty 474

## 2023-11-02 MED FILL — Fosaprepitant Dimeglumine For IV Infusion 150 MG (Base Eq): INTRAVENOUS | Qty: 5 | Status: AC

## 2023-11-02 NOTE — Plan of Care (Signed)

## 2023-11-02 NOTE — Consult Note (Signed)
[facility]  [phone]   HEMATOLOGY/ONCOLOGY IN-PATIENT CONSULTATION NOTE   PATIENT NAME: Harry Williams   MR#: [id number] DOB: September 14, 1981 CSN#: [id number]   DATE OF SERVICE: 11/02/2023  Requesting Physician: [facility]   Patient Care Team: [name], MD as PCP - General (Family Medicine) Noe Gens, MD as Referring Physician (Otolaryngology) [name], MD as Attending Physician (Radiation Oncology) [name], [name], RN as Oncology Nurse Navigator [name], MD as Consulting Physician (Oncology)  REASON FOR CONSULTATION:  Management decisions in a patient with 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  ASSESSMENT & PLAN:   1.  Malignant neoplasm of tonsillar fossa -Recently diagnosed left tonsillar squamous cell carcinoma -Started concurrent chemoradiation with weekly cisplatin from 09/27/2023 (first dose of cisplatin was on 09/29/2023) -Last dose of cisplatin was on 10/21/2023.  Chemotherapy was held last week because of developing leukopenia and also because of Thanksgiving holiday schedule.  -Will hold chemotherapy this week because of acute issues.  Defer radiation treatments to our radiation oncology colleagues.   2.  Neutropenic fever -Patient with previous temp on 11/01/23 of 103.1.  Today he is afebrile at 98.9. -Blood cultures and infectious workup negative til date. -Continue IV cefepime per order -Monitor fever curve   3. Leukopenia -related to chemotherapy.  Last dose of cisplatin was on 10/21/2023. -monitor CBC with differential daily.  If ANC remains below thousand, we can give him Neupogen for growth factor support.  ANC was 1000 on 11/01/2023.  4. Pain from mucositis -Radiation-induced mucositis, grade 3. -Continue aggressive pain management and lidocaine mouth washes.    5.  Nutrition -Continue supplements through feeding tube as per dietitian recommendations.       Code Status Full     Discharge planning: Patient has appointment with me on 11/03/2023.  We will get this rescheduled.  HISTORY OF PRESENT ILLNESS:  Harry Williams is a 41 y.o. 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  He was admitted from our clinic yesterday after he presented with neutropenic fever, worsening pain from mucositis.  Patient seen and evaluated.  Pain is slightly under better control but still rates at 8 out of 10.  Oncology History  Malignant neoplasm of tonsillar fossa (HCC)  07/20/2023 Imaging   CT neck soft tissue: 2.5 x 3 cm mass in the left tonsillar region with extension towards the tongue base, suspicious for carcinoma, and evidence of level 2 lymphadenopathy on the left, consisting of a dominant node measuring 3 x 2 x 1.7 cm. A benign appearing osteoma was also demonstrated, projecting inferior from the angle of the left mandible.    08/26/2023 Initial Diagnosis   Malignant neoplasm of tonsillar fossa [facility]):  He presented to his PCP for routine follow-up in July 2024 and was incidentally noted to have left submandibular swelling on a routine examination performed at that time. A soft tissue head and neck ultrasound was subsequently performed on 07/07/23 which showed an ovoid mass measuring 2.8 x 1.9 cm, most consistent with an abnormally enlarged lymph node, and correlating with the palpable area in the left submandibular region.    A soft tissue neck CT was also performed on 07/20/23 which demonstrated: a  2.5 x 3 cm mass in the left  tonsillar region with extension towards the tongue base, suspicious for carcinoma, and evidence of level 2 lymphadenopathy on the left,  consisting of a dominant node measuring 3 x 2 x 1.7 cm. A benign appearing osteoma was also demonstrated, projecting inferior from the angle of the left mandible.   Subsequently, the patient was referred to Dr. Roma Schanz at [facility] and underwent biopsies of the left tonsil on 08/26/23. 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    08/26/2023 Pathology Results   Patient was referred to Dr. Roma Schanz at [facility] and underwent biopsies of the left tonsil on 08/26/23. 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    09/07/2023 PET scan   PET scan performed on 09/07/23 at [facility]: intense radiotracer uptake within the left palatine tonsillar mass; adjacent radiotracer uptake within the region of the right palatine tonsil; and hypermetabolic nodes in the left neck extending to the supraclavicular region concerning for nodal metastases. Focal uptake at the anorectal junction was also demonstrated, possibly reflecting muscular contraction, however local malignancy can not be excluded. PET otherwise showed no evidence of more distant metastatic disease.    09/14/2023 Cancer Staging   Staging form: Pharynx - HPV-Mediated Oropharynx, AJCC 8th Edition - Clinical stage from 09/14/2023: Stage I (cT2, cN1, cM0, p16+) - Signed by [name], MD on 09/14/2023 Stage prefix: Initial diagnosis   09/29/2023 -  Chemotherapy   Patient is on Treatment Plan : HEAD/NECK Cisplatin (41) q7d        MEDICAL HISTORY Past Medical History:  Diagnosis Date   Anxiety    Asthma    Avascular necrosis of hip, left (HCC) 07/24/2021   Bright red blood per rectum 12/16/2022   Closed fracture of nasal bone with routine healing 12/24/2016   Depression    PTSD (post-traumatic stress disorder)      SURGICAL HISTORY Past Surgical History:  Procedure Laterality Date   APPENDECTOMY     COLON SURGERY   03/30/2015   IR GASTROSTOMY TUBE MOD SED  09/22/2023   IR IMAGING GUIDED PORT INSERTION  09/22/2023   JOINT REPLACEMENT  2008   partial right elbow; right femoral graft   Total abdominal proctocolectomy, J-pouch, ileal pouch anal anastomosis  2016     ALLERGIES  No Known Allergies  FAMILY HISTORY  Family History  Problem Relation Age of Onset   Arthritis Mother    Anemia Mother    Diabetes Father    Hypertension Brother    Asthma Daughter    Heart disease Paternal Grandmother    Cancer Paternal Grandfather      SOCIAL HISTORY   Social History   Socioeconomic History   Marital status: Married    Spouse name: Teacher, English as a foreign language   Number of children: 3   Years of education: 12+   Highest education level: Some college, no degree  Occupational History   Not on file  Tobacco Use   Smoking status: Never   Smokeless tobacco: Never  Vaping Use   Vaping status: Never Used  Substance and Sexual Activity   Alcohol use: Yes    Comment: Occasional   Drug use: Never   Sexual activity: Yes    Partners: Female  Other Topics Concern   Not on file  Social History Narrative   ** Merged History Encounter **       Social Determinants of Health   Financial Resource Strain: Not on file  Food Insecurity:  No Food Insecurity (11/01/2023)   Hunger Vital Sign    Worried About Running Out of Food in the Last Year: Never true    Ran Out of Food in the Last Year: Never true  Transportation Needs: No Transportation Needs (11/01/2023)   PRAPARE - Administrator, Civil Service (Medical): No    Lack of Transportation (Non-Medical): No  Physical Activity: Not on file  Stress: Not on file  Social Connections: Not on file  Intimate Partner Violence: Not At Risk (11/01/2023)   Humiliation, Afraid, Rape, and Kick questionnaire    Fear of Current or Ex-Partner: No    Emotionally Abused: No    Physically Abused: No    Sexually Abused: No    CURRENT MEDICATIONS   Current Outpatient  Medications  Medication Instructions   baclofen (LIORESAL) 10 mg, Oral, 3 times daily   celecoxib (CELEBREX) 200 mg, Per Tube, Daily PRN   dexamethasone (DECADRON) 8 mg, Per Tube, See admin instructions, 8 mg, per tube, once a day for three days- starting the day after Cisplatin chemotherapy   HYDROcodone-acetaminophen (HYCET) 7.5-325 mg/15 ml solution 10 mLs, Oral, Every 4 hours PRN   HYDROcodone-acetaminophen (NORCO) 10-325 MG tablet 1 tablet, Oral, Every 6 hours PRN   lidocaine (XYLOCAINE) 2 % solution Patient: Mix 1part 2% viscous lidocaine, 1part H20. Swish & swallow 10mL of diluted mixture, before meals and at bedtime, up to QID   lidocaine-prilocaine (EMLA) cream Apply to affected area once   Nutritional Supplements (FEEDING SUPPLEMENT, OSMOLITE 1.5 CAL,) LIQD 7 cartons Osmolite 1.5 split over four feeding daily. Flush tube with 60 ml water before and after each bolus. Provide additional 3 1/2 cups (830 ml) water/day to meet hydration needs. Provides 2485 kcal, 104.3 g protein, 1267 ml free water from formula (2577 ml total water with flushes). 1659 ml/day meets 100% RDI   omeprazole (KONVOMEP) 20 mg, Per Tube, Daily   omeprazole (PRILOSEC) 20 mg, See admin instructions, 20 mg per tube once a day   ondansetron (ZOFRAN) 8 mg, Oral, Every 8 hours PRN, Start on the third day after cisplatin.   PARoxetine (PAXIL CR) 12.5 mg, Oral, Daily   predniSONE (DELTASONE) 20 MG tablet Take 4 tablets by mouth once daily (80 mg dose) for 7 days, followed by 3 tablets by mouth once daily, continue until further instructions   prochlorperazine (COMPAZINE) 10 mg, Oral, Every 6 hours PRN   scopolamine (TRANSDERM-SCOP) 1.5 mg, Transdermal, every 72 hours   sucralfate (CARAFATE) 1 g tablet Dissolve 1 tablet in 10 mL H20 and swallow 30 min prior to meals and bedtime.   Vitamin D (Ergocalciferol) (DRISDOL) 50,000 Units, Oral, Every 7 days, Take for 8 total doses(weeks)   Vitamin D3 1,000 Units, Per Tube,  Daily     REVIEW OF SYSTEMS   Review of Systems - Oncology  All other pertinent review of systems is negative except as mentioned above in HPI  PHYSICAL EXAMINATION  ECOG PERFORMANCE STATUS: 2 - Symptomatic, <50% confined to bed  Vitals:   11/02/23 0343 11/02/23 0734  BP: 138/85 (!) 143/87  Pulse: 92 (!) 111  Resp: 20 18  Temp: 99 F (37.2 C) 98.9 F (37.2 C)  SpO2: 98% 97%   Filed Weights   11/01/23 1736  Weight: 171 lb (77.6 kg)    Physical Exam Constitutional:      General: He is not in acute distress.    Appearance: Normal appearance.  HENT:     Head:  Normocephalic and atraumatic.     Mouth/Throat:     Comments: Evidence of mucositis noted, stable compared to last clinic visit Eyes:     General: No scleral icterus.    Conjunctiva/sclera: Conjunctivae normal.  Cardiovascular:     Rate and Rhythm: Normal rate and regular rhythm.     Heart sounds: Normal heart sounds.  Pulmonary:     Effort: Pulmonary effort is normal.     Breath sounds: Normal breath sounds.  Abdominal:     General: There is no distension.  Musculoskeletal:     Right lower leg: No edema.     Left lower leg: No edema.  Neurological:     General: No focal deficit present.     Mental Status: He is alert and oriented to person, place, and time.  Psychiatric:        Mood and Affect: Mood normal.        Behavior: Behavior normal.        Thought Content: Thought content normal.     LABORATORY DATA:   I have reviewed the data as listed  Results for orders placed or performed during the hospital encounter of 11/01/23 (from the past 24 hour(s))  Basic metabolic panel   Collection Time: 11/02/23  5:00 AM  Result Value Ref Range   Sodium 133 (L) 135 - 145 mmol/L   Potassium 4.1 3.5 - 5.1 mmol/L   Chloride 97 (L) 98 - 111 mmol/L   CO2 28 22 - 32 mmol/L   Glucose, Bld 169 (H) 70 - 99 mg/dL   BUN 15 6 - 20 mg/dL   Creatinine, Ser 2.72 0.61 - 1.24 mg/dL   Calcium 8.7 (L) 8.9 - 10.3 mg/dL    GFR, Estimated >53 >66 mL/min   Anion gap 8 5 - 15  CBC   Collection Time: 11/02/23  5:00 AM  Result Value Ref Range   WBC 2.1 (L) 4.0 - 10.5 K/uL   RBC 3.74 (L) 4.22 - 5.81 MIL/uL   Hemoglobin 11.6 (L) 13.0 - 17.0 g/dL   HCT 44.0 (L) 34.7 - 42.5 %   MCV 90.9 80.0 - 100.0 fL   MCH 31.0 26.0 - 34.0 pg   MCHC 34.1 30.0 - 36.0 g/dL   RDW 95.6 38.7 - 56.4 %   Platelets 190 150 - 400 K/uL   nRBC 0.0 0.0 - 0.2 %  Results for orders placed or performed in visit on 11/01/23 (from the past 24 hour(s))  Culture, blood (routine x 2)   Collection Time: 11/01/23 12:38 PM   Specimen: BLOOD RIGHT ARM  Result Value Ref Range   Specimen Description BLOOD RIGHT ARM    Special Requests      BOTTLES DRAWN AEROBIC AND ANAEROBIC Blood Culture results may not be optimal due to an inadequate volume of blood received in culture bottles   Culture      NO GROWTH < 24 HOURS Performed at [facility], [address] [place], [place] [address]    Report Status PENDING   Results for orders placed or performed in visit on 11/01/23 (from the past 24 hour(s))  Urinalysis, Complete w Microscopic   Collection Time: 11/01/23 10:24 AM  Result Value Ref Range   Color, Urine YELLOW YELLOW   APPearance CLEAR CLEAR   Specific Gravity, Urine 1.027 1.005 - 1.030   pH 7.0 5.0 - 8.0   Glucose, UA 50 (A) NEGATIVE mg/dL   Hgb urine dipstick NEGATIVE NEGATIVE  Bilirubin Urine NEGATIVE NEGATIVE   Ketones, ur NEGATIVE NEGATIVE mg/dL   Protein, ur 30 (A) NEGATIVE mg/dL   Nitrite NEGATIVE NEGATIVE   Leukocytes,Ua NEGATIVE NEGATIVE   RBC / HPF 0-5 0 - 5 RBC/hpf   WBC, UA 0-5 0 - 5 WBC/hpf   Bacteria, UA NONE SEEN NONE SEEN   Squamous Epithelial / HPF 0-5 0 - 5 /HPF   Mucus PRESENT   Results for orders placed or performed in visit on 11/01/23 (from the past 24 hour(s))  Culture, blood (routine x 2)   Collection Time: 11/01/23 12:32 PM   Specimen: BLOOD LEFT ARM  Result Value Ref Range   Specimen Description  BLOOD LEFT ARM    Special Requests      BOTTLES DRAWN AEROBIC AND ANAEROBIC Blood Culture results may not be optimal due to an inadequate volume of blood received in culture bottles   Culture      NO GROWTH < 24 HOURS Performed at [facility], [address], [place], [place] [address]    Report Status PENDING   Results for orders placed or performed in visit on 11/01/23 (from the past 24 hour(s))  Resp panel by RT-PCR (RSV, Flu A&B, Covid) Anterior Nasal Swab   Collection Time: 11/01/23 12:32 PM   Specimen: Anterior Nasal Swab  Result Value Ref Range   SARS Coronavirus 2 by RT PCR NEGATIVE NEGATIVE   Influenza A by PCR NEGATIVE NEGATIVE   Influenza B by PCR NEGATIVE NEGATIVE   Resp Syncytial Virus by PCR NEGATIVE NEGATIVE  Results for orders placed or performed in visit on 11/01/23 (from the past 24 hour(s))  CBC with Differential (Cancer Center Only)   Collection Time: 11/01/23 10:24 AM  Result Value Ref Range   WBC Count 1.8 (L) 4.0 - 10.5 K/uL   RBC 3.97 (L) 4.22 - 5.81 MIL/uL   Hemoglobin 12.2 (L) 13.0 - 17.0 g/dL   HCT 60.4 (L) 54.0 - 98.1 %   MCV 89.4 80.0 - 100.0 fL   MCH 30.7 26.0 - 34.0 pg   MCHC 34.4 30.0 - 36.0 g/dL   RDW 19.1 47.8 - 29.5 %   Platelet Count 196 150 - 400 K/uL   nRBC 0.0 0.0 - 0.2 %   Neutrophils Relative % 56 %   Neutro Abs 1.0 (L) 1.7 - 7.7 K/uL   Lymphocytes Relative 9 %   Lymphs Abs 0.2 (L) 0.7 - 4.0 K/uL   Monocytes Relative 33 %   Monocytes Absolute 0.6 0.1 - 1.0 K/uL   Eosinophils Relative 1 %   Eosinophils Absolute 0.0 0.0 - 0.5 K/uL   Basophils Relative 1 %   Basophils Absolute 0.0 0.0 - 0.1 K/uL   Immature Granulocytes 0 %   Abs Immature Granulocytes 0.00 0.00 - 0.07 K/uL  CMP (Cancer Center only)   Collection Time: 11/01/23 10:24 AM  Result Value Ref Range   Sodium 132 (L) 135 - 145 mmol/L   Potassium 4.3 3.5 - 5.1 mmol/L   Chloride 94 (L) 98 - 111 mmol/L   CO2 33 (H) 22 - 32 mmol/L   Glucose, Bld 145 (H) 70 - 99 mg/dL    BUN 13 6 - 20 mg/dL   Creatinine 6.21 3.08 - 1.24 mg/dL   Calcium 9.3 8.9 - 65.7 mg/dL   Total Protein 7.2 6.5 - 8.1 g/dL   Albumin 3.8 3.5 - 5.0 g/dL   AST 34 15 - 41 U/L   ALT 70 (H) 0 -  44 U/L   Alkaline Phosphatase 112 38 - 126 U/L   Total Bilirubin 0.4 <1.2 mg/dL   GFR, Estimated >16 >10 mL/min   Anion gap 5 5 - 15      RADIOGRAPHIC STUDIES:  I have personally reviewed the radiological images as listed and agree with the findings in the report.  DG Chest 2 View  Result Date: 11/01/2023 CLINICAL DATA:  Fever. EXAM: CHEST - 2 VIEW COMPARISON:  02/14/2019 FINDINGS: Interval Port-A-Cath placement with catheter tip in the distal SVC. The heart size and mediastinal contours are within normal limits. There may be some mild bilateral perihilar bronchial thickening, especially on the lateral view. This may be consistent with bronchitis. There is no evidence of pulmonary edema, consolidation, pneumothorax, nodule or pleural fluid. The visualized skeletal structures are unremarkable. IMPRESSION: Possible mild perihilar bronchial thickening bilaterally which may be consistent with acute bronchitis. Electronically Signed   By: Irish Lack M.D.   On: 11/01/2023 10:59   OCT, Retina - OU - Both Eyes  Result Date: 10/06/2023 Right Eye Quality was good. Central Foveal Thickness: 246. Progression has no prior data. Findings include normal foveal contour, no IRF, no SRF, vitreomacular adhesion . Left Eye Central Foveal Thickness: 271. Progression has no prior data. Findings include normal foveal contour, no IRF, no SRF, vitreomacular adhesion . 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 CME - cystoid macular edema. PED - pigment epithelial detachment. IRF - intraretinal fluid. SRF - subretinal fluid. EZ - ellipsoid zone. ERM - epiretinal membrane. ORA - outer retinal atrophy. ORT - outer  retinal tubulation. SRHM - subretinal hyper-reflective material. IRHM - intraretinal hyper-reflective material     Rest of care as per primary team and other specialties.  Thanks for the opportunity to participate in the care of this patient. Please contact me if there are any questions.   [name], MD Medical Oncology and Hematology 11/02/2023 9:35 AM    This document was completed utilizing speech recognition software. Grammatical errors, random word insertions, pronoun errors, and incomplete sentences are an occasional consequence of this system due to software limitations, ambient noise, and hardware issues. Any formal questions or concerns about the content, text or information contained within the body of this dictation should be directly addressed to the provider for clarification.

## 2023-11-02 NOTE — Progress Notes (Signed)
Initial Nutrition Assessment  INTERVENTION:   Resume home tube feeding regimen: -Administer 474 ml Osmolite 1.5 QID via PEG -Flush with 120 ml free water before and after each feeding -Provides 2840 kcals, 119g protein and 2408 ml H2O  NUTRITION DIAGNOSIS:   Increased nutrient needs related to cancer and cancer related treatments as evidenced by estimated needs.  GOAL:   Patient will meet greater than or equal to 90% of their needs  MONITOR:   Labs, Weight trends, TF tolerance, I & O's  REASON FOR ASSESSMENT:   Consult Enteral/tube feeding initiation and management  ASSESSMENT:   42 y.o. male with medical history significant for malignant neoplasm of tonsillar fossa being admitted to the hospital with neutropenic fever. PEG placed 10/24.  Patient in room, no family at bedside. Pt reports he has been tolerating his tube feeding. Has felt really bad since last chemo. Has been tolerating Osmolite 1.5, administers 2 cartons at a time every 4 hours. Sometimes has a feeding at night when he cannot sleep as it helps him fall back asleep. Pt administering ~7-8 cartons daily. Pt administers feeds and flushes himself.   UBW: 180 lbs Current weight: 171 lbs.  Medications reviewed.  Labs reviewed: Low Na   NUTRITION - FOCUSED PHYSICAL EXAM:  Flowsheet Row Most Recent Value  Orbital Region Mild depletion  Upper Arm Region No depletion  Thoracic and Lumbar Region No depletion  Buccal Region Unable to assess  [beard]  Temple Region No depletion  Clavicle Bone Region Moderate depletion  Clavicle and Acromion Bone Region Mild depletion  Scapular Bone Region Mild depletion  Dorsal Hand No depletion  Patellar Region No depletion  Anterior Thigh Region No depletion  Posterior Calf Region No depletion  Edema (RD Assessment) None  Hair Reviewed  Eyes Reviewed  Mouth Reviewed  Skin Reviewed       Diet Order:   Diet Order             Diet NPO time specified  Diet  effective now                   EDUCATION NEEDS:   No education needs have been identified at this time  Skin:  Skin Assessment: Reviewed RN Assessment  Last BM:  12/2  Height:   Ht Readings from Last 1 Encounters:  11/01/23 5\' 8"  (1.727 m)    Weight:   Wt Readings from Last 1 Encounters:  11/01/23 77.6 kg    BMI:  Body mass index is 26 kg/m.  Estimated Nutritional Needs:   Kcal:  2400-2700  Protein:  120-135g  Fluid:  2.4L/day   Tilda Franco, MS, RD, LDN Inpatient Clinical Dietitian Contact information available via Amion

## 2023-11-02 NOTE — Assessment & Plan Note (Addendum)
-   Continue Hycet and lidocaine solutions - Fentanyl patch added per oncology and continued at discharge

## 2023-11-02 NOTE — Assessment & Plan Note (Addendum)
-   follows outpatient with Dr. [name] -Being treated with chemoradiation - continue TF; he is NPO at home, okay for ice chips

## 2023-11-02 NOTE — Progress Notes (Signed)
Progress Note    Harry Williams   ZOX:[id number]  DOB: July 28, 1981  DOA: 11/01/2023     1 PCP: [name], MD  Initial CC: fever  Hospital Course: Harry Williams is a 42 yo male with PMH malignant neoplasm of tonsillar fossa being admitted to the hospital with neutropenic fever.  He has been tolerating his chemotherapy, over the last week however he has had worsening oral pain, felt to be due to mucositis.  Also over the last 4 days, he has been having intermittent fevers, sweats, and subjective chills.   He was seen at the oncology clinic and due to fever, he was sent for further workup in the hospital. Cultures obtained and antibiotics initiated on admission.  Interval History:  Tmax overnight, 103.1.  Biggest complaint this morning is his pain.  We discussed making Hycet scheduled.  Also updated wife when she arrived later in the morning.  Assessment and Plan: * Neutropenic fever (HCC) - Workup negative for source on admission.  Patient has no urinary or respiratory symptoms. -Continue following cultures - Continue cefepime - Continue neutropenic precautions -Continue trending ANC  Oral mucositis due to radiation therapy - Continue Hycet and lidocaine solutions - Continue Dilaudid for breakthrough  Malignant neoplasm of tonsillar fossa (HCC) - follows outpatient with Dr. [name] -Being treated with chemoradiation - continue TF; he is NPO at home, okay for ice chips    Old records reviewed in assessment of this patient  Antimicrobials: Cefepime 11/01/2023 >> current  DVT prophylaxis:  enoxaparin (LOVENOX) injection 40 mg Start: 11/01/23 2200 SCDs Start: 11/01/23 1649   Code Status:   Code Status: Full Code  Mobility Assessment (Last 72 Hours)     Mobility Assessment     Row Name 11/02/23 0852 11/01/23 2300 11/01/23 1654       Does patient have an order for bedrest or is patient medically unstable No - Continue assessment No - Continue assessment No - Continue  assessment     What is the highest level of mobility based on the progressive mobility assessment? Level 6 (Walks independently in room and hall) - Balance while walking in room without assist - Complete Level 6 (Walks independently in room and hall) - Balance while walking in room without assist - Complete Level 6 (Walks independently in room and hall) - Balance while walking in room without assist - Complete              Barriers to discharge:  Disposition Plan: Home Status is: Inpatient  Objective: Blood pressure 138/79, pulse 99, temperature 99.4 F (37.4 C), temperature source Oral, resp. rate 16, height 5\' 8"  (1.727 m), weight 77.6 kg, SpO2 99%.  Examination:  Physical Exam Constitutional:      General: He is not in acute distress.    Appearance: Normal appearance.  HENT:     Head: Normocephalic and atraumatic.     Mouth/Throat:     Mouth: Mucous membranes are moist.  Eyes:     Extraocular Movements: Extraocular movements intact.  Cardiovascular:     Rate and Rhythm: Normal rate and regular rhythm.  Pulmonary:     Effort: Pulmonary effort is normal. No respiratory distress.     Breath sounds: Normal breath sounds. No wheezing.  Abdominal:     General: Bowel sounds are normal. There is no distension.     Palpations: Abdomen is soft.     Tenderness: There is no abdominal tenderness.     Comments: G- tube in place  Musculoskeletal:        General: Normal range of motion.     Cervical back: Normal range of motion and neck supple.  Skin:    General: Skin is warm and dry.  Neurological:     General: No focal deficit present.     Mental Status: He is alert.  Psychiatric:        Mood and Affect: Mood normal.        Behavior: Behavior normal.      Consultants:  Oncology  Procedures:    Data Reviewed: Results for orders placed or performed during the hospital encounter of 11/01/23 (from the past 24 hour(s))  Basic metabolic panel     Status: Abnormal    Collection Time: 11/02/23  5:00 AM  Result Value Ref Range   Sodium 133 (L) 135 - 145 mmol/L   Potassium 4.1 3.5 - 5.1 mmol/L   Chloride 97 (L) 98 - 111 mmol/L   CO2 28 22 - 32 mmol/L   Glucose, Bld 169 (H) 70 - 99 mg/dL   BUN 15 6 - 20 mg/dL   Creatinine, Ser 3.32 0.61 - 1.24 mg/dL   Calcium 8.7 (L) 8.9 - 10.3 mg/dL   GFR, Estimated >95 >18 mL/min   Anion gap 8 5 - 15  CBC     Status: Abnormal   Collection Time: 11/02/23  5:00 AM  Result Value Ref Range   WBC 2.1 (L) 4.0 - 10.5 K/uL   RBC 3.74 (L) 4.22 - 5.81 MIL/uL   Hemoglobin 11.6 (L) 13.0 - 17.0 g/dL   HCT 84.1 (L) 66.0 - 63.0 %   MCV 90.9 80.0 - 100.0 fL   MCH 31.0 26.0 - 34.0 pg   MCHC 34.1 30.0 - 36.0 g/dL   RDW 16.0 10.9 - 32.3 %   Platelets 190 150 - 400 K/uL   nRBC 0.0 0.0 - 0.2 %    I have reviewed pertinent nursing notes, vitals, labs, and images as necessary. I have ordered labwork to follow up on as indicated.  I have reviewed the last notes from staff over past 24 hours. I have discussed patient's care plan and test results with nursing staff, CM/SW, and other staff as appropriate.  Time spent: Greater than 50% of the 55 minute visit was spent in counseling/coordination of care for the patient as laid out in the A&P.   LOS: 1 day   Lewie Chamber, MD [facility] 11/02/2023, 4:19 PM

## 2023-11-02 NOTE — Hospital Course (Addendum)
Mr. Drysdale is a 42 yo male with PMH malignant neoplasm of tonsillar fossa being admitted to the hospital with neutropenic fever.  He has been tolerating his chemotherapy, over the last week however he has had worsening oral pain, felt to be due to mucositis.  Also over the last 4 days, he has been having intermittent fevers, sweats, and subjective chills.   He was seen at the oncology clinic and due to fever, he was sent for further workup in the hospital. Cultures obtained and antibiotics initiated on admission. Infectious workup remained negative during hospitalization.  ANC stabilized.  He remained afebrile for over 48 hours prior to discharge.

## 2023-11-02 NOTE — Assessment & Plan Note (Addendum)
-   Workup negative for source on admission.  Patient has no urinary or respiratory symptoms. -Continue following cultures; remaining negative -Treated with 4 days cefepime during hospitalization.  No indication to continue antibiotics at discharge - ANC approximately 944 at discharge.  Afebrile for over 48 hours

## 2023-11-03 ENCOUNTER — Inpatient Hospital Stay: Payer: BC Managed Care – PPO | Admitting: Nutrition

## 2023-11-03 ENCOUNTER — Inpatient Hospital Stay: Payer: BC Managed Care – PPO

## 2023-11-03 ENCOUNTER — Inpatient Hospital Stay: Payer: BC Managed Care – PPO | Admitting: Oncology

## 2023-11-03 ENCOUNTER — Ambulatory Visit: Payer: BC Managed Care – PPO

## 2023-11-03 ENCOUNTER — Other Ambulatory Visit: Payer: Self-pay

## 2023-11-03 ENCOUNTER — Other Ambulatory Visit: Payer: Self-pay | Admitting: Oncology

## 2023-11-03 ENCOUNTER — Ambulatory Visit
Admission: RE | Admit: 2023-11-03 | Discharge: 2023-11-03 | Disposition: A | Discharge status: Home / Self Care | Payer: BC Managed Care – PPO | Source: Ambulatory Visit | Attending: Radiation Oncology | Admitting: Radiation Oncology

## 2023-11-03 ENCOUNTER — Ambulatory Visit: Payer: BC Managed Care – PPO | Admitting: Oncology

## 2023-11-03 DIAGNOSIS — D709 Neutropenia, unspecified: Secondary | ICD-10-CM | POA: Diagnosis not present

## 2023-11-03 DIAGNOSIS — K1233 Oral mucositis (ulcerative) due to radiation: Secondary | ICD-10-CM | POA: Diagnosis not present

## 2023-11-03 DIAGNOSIS — R5081 Fever presenting with conditions classified elsewhere: Secondary | ICD-10-CM | POA: Diagnosis not present

## 2023-11-03 DIAGNOSIS — G893 Neoplasm related pain (acute) (chronic): Secondary | ICD-10-CM

## 2023-11-03 LAB — RAD ONC ARIA SESSION SUMMARY
Course Elapsed Days: 37
Plan Fractions Treated to Date: 26
Plan Prescribed Dose Per Fraction: 2 Gy
Plan Total Fractions Prescribed: 35
Plan Total Prescribed Dose: 70 Gy
Reference Point Dosage Given to Date: 52 Gy
Reference Point Session Dosage Given: 2 Gy
Session Number: 26

## 2023-11-03 LAB — CBC WITH DIFFERENTIAL/PLATELET
Abs Immature Granulocytes: 0.01 10*3/uL (ref 0.00–0.07)
Basophils Absolute: 0 10*3/uL (ref 0.0–0.1)
Basophils Relative: 1 %
Eosinophils Absolute: 0 10*3/uL (ref 0.0–0.5)
Eosinophils Relative: 1 %
HCT: 32.5 % — ABNORMAL LOW (ref 39.0–52.0)
Hemoglobin: 11.2 g/dL — ABNORMAL LOW (ref 13.0–17.0)
Immature Granulocytes: 1 %
Lymphocytes Relative: 10 %
Lymphs Abs: 0.2 10*3/uL — ABNORMAL LOW (ref 0.7–4.0)
MCH: 31.4 pg (ref 26.0–34.0)
MCHC: 34.5 g/dL (ref 30.0–36.0)
MCV: 91 fL (ref 80.0–100.0)
Monocytes Absolute: 0.6 10*3/uL (ref 0.1–1.0)
Monocytes Relative: 28 %
Neutro Abs: 1.3 10*3/uL — ABNORMAL LOW (ref 1.7–7.7)
Neutrophils Relative %: 59 %
Platelets: 265 10*3/uL (ref 150–400)
RBC: 3.57 MIL/uL — ABNORMAL LOW (ref 4.22–5.81)
RDW: 12.2 % (ref 11.5–15.5)
WBC: 2.1 10*3/uL — ABNORMAL LOW (ref 4.0–10.5)
nRBC: 0 % (ref 0.0–0.2)

## 2023-11-03 LAB — BASIC METABOLIC PANEL
Anion gap: 7 (ref 5–15)
BUN: 13 mg/dL (ref 6–20)
CO2: 31 mmol/L (ref 22–32)
Calcium: 8.8 mg/dL — ABNORMAL LOW (ref 8.9–10.3)
Chloride: 96 mmol/L — ABNORMAL LOW (ref 98–111)
Creatinine, Ser: 0.76 mg/dL (ref 0.61–1.24)
GFR, Estimated: >60 mL/min (ref >60)
Glucose, Bld: 100 mg/dL — ABNORMAL HIGH (ref 70–99)
Potassium: 4.2 mmol/L (ref 3.5–5.1)
Sodium: 134 mmol/L — ABNORMAL LOW (ref 135–145)

## 2023-11-03 LAB — MAGNESIUM: Magnesium: 2.5 mg/dL — ABNORMAL HIGH (ref 1.7–2.4)

## 2023-11-03 MED ORDER — FENTANYL 50 MCG/HR TD PT72: 1.0000 | MEDICATED_PATCH | TRANSDERMAL | 0 refills | Status: DC

## 2023-11-03 NOTE — Plan of Care (Signed)
Patient briefly visited.  His wife was by the bedside.  He is making clinical progress.  ANC is more than 1000.  No indication for growth factor support.  He continues to receive radiation treatments.  Will hold chemotherapy this week.  I will plan to see him again in clinic next week for resumption of chemotherapy.  I sent prescription for fentanyl patch to his pharmacy for outpatient use.  He can continue liquid hydrocodone as needed for breakthrough pain.

## 2023-11-03 NOTE — Progress Notes (Signed)
Progress Note    Harry Williams   ZOX:[id number]  DOB: 1981/10/21  DOA: 11/01/2023     2 PCP: Harry Banana, MD  Initial CC: fever  Hospital Course: Harry Williams is a 42 yo male with PMH malignant neoplasm of tonsillar fossa being admitted to the hospital with neutropenic fever.  He has been tolerating his chemotherapy, over the last week however he has had worsening oral pain, felt to be due to mucositis.  Also over the last 4 days, he has been having intermittent fevers, sweats, and subjective chills.   He was seen at the oncology clinic and due to fever, he was sent for further workup in the hospital. Cultures obtained and antibiotics initiated on admission.  Interval History:  No fever noted over the past 24 hours.  Cultures remain negative as well.  Pain is also better controlled today on current regimen with scheduled Hycet.  He did not wish for any changes to the regimen.  Assessment and Plan: * Neutropenic fever (HCC) - Workup negative for source on admission.  Patient has no urinary or respiratory symptoms. -Continue following cultures; remaining negative - Continue cefepime - Continue neutropenic precautions -Continue trending ANC.  Currently ANC 1,260 today  Oral mucositis due to radiation therapy - Continue Hycet and lidocaine solutions - Continue Dilaudid for breakthrough -Pain better controlled today, patient okay with current regimen in place  Malignant neoplasm of tonsillar fossa (HCC) - follows outpatient with Harry Williams -Being treated with chemoradiation - continue TF; he is NPO at home, okay for ice chips    Old records reviewed in assessment of this patient  Antimicrobials: Cefepime 11/01/2023 >> current  DVT prophylaxis:  enoxaparin (LOVENOX) injection 40 mg Start: 11/01/23 2200 SCDs Start: 11/01/23 1649   Code Status:   Code Status: Full Code  Mobility Assessment (Last 72 Hours)     Mobility Assessment     Row Name 11/03/23 [phone] 11/02/23 0852  11/01/23 2300 11/01/23 1654     Does patient have an order for bedrest or is patient medically unstable No - Continue assessment No - Continue assessment No - Continue assessment No - Continue assessment    What is the highest level of mobility based on the progressive mobility assessment? Level 6 (Walks independently in room and hall) - Balance while walking in room without assist - Complete Level 6 (Walks independently in room and hall) - Balance while walking in room without assist - Complete Level 6 (Walks independently in room and hall) - Balance while walking in room without assist - Complete Level 6 (Walks independently in room and hall) - Balance while walking in room without assist - Complete             Barriers to discharge:  Disposition Plan: Home Status is: Inpatient  Objective: Blood pressure 125/84, pulse 89, temperature 98.9 F (37.2 C), temperature source Oral, resp. rate 18, height 5\' 8"  (1.727 m), weight 77.6 kg, SpO2 97%.  Examination:  Physical Exam Constitutional:      General: He is not in acute distress.    Appearance: Normal appearance.  HENT:     Head: Normocephalic and atraumatic.     Mouth/Throat:     Mouth: Mucous membranes are moist.  Eyes:     Extraocular Movements: Extraocular movements intact.  Cardiovascular:     Rate and Rhythm: Normal rate and regular rhythm.  Pulmonary:     Effort: Pulmonary effort is normal. No respiratory distress.     Breath  sounds: Normal breath sounds. No wheezing.  Abdominal:     General: Bowel sounds are normal. There is no distension.     Palpations: Abdomen is soft.     Tenderness: There is no abdominal tenderness.     Comments: G- tube in place  Musculoskeletal:        General: Normal range of motion.     Cervical back: Normal range of motion and neck supple.  Skin:    General: Skin is warm and dry.  Neurological:     General: No focal deficit present.     Mental Status: He is alert.  Psychiatric:         Mood and Affect: Mood normal.        Behavior: Behavior normal.      Consultants:  Oncology  Procedures:    Data Reviewed: Results for orders placed or performed during the hospital encounter of 11/01/23 (from the past 24 hour(s))  Basic metabolic panel     Status: Abnormal   Collection Time: 11/03/23  5:00 AM  Result Value Ref Range   Sodium 134 (L) 135 - 145 mmol/L   Potassium 4.2 3.5 - 5.1 mmol/L   Chloride 96 (L) 98 - 111 mmol/L   CO2 31 22 - 32 mmol/L   Glucose, Bld 100 (H) 70 - 99 mg/dL   BUN 13 6 - 20 mg/dL   Creatinine, Ser 4.09 0.61 - 1.24 mg/dL   Calcium 8.8 (L) 8.9 - 10.3 mg/dL   GFR, Estimated >81 >19 mL/min   Anion gap 7 5 - 15  CBC with Differential/Platelet     Status: Abnormal   Collection Time: 11/03/23  5:00 AM  Result Value Ref Range   WBC 2.1 (L) 4.0 - 10.5 K/uL   RBC 3.57 (L) 4.22 - 5.81 MIL/uL   Hemoglobin 11.2 (L) 13.0 - 17.0 g/dL   HCT 14.7 (L) 82.9 - 56.2 %   MCV 91.0 80.0 - 100.0 fL   MCH 31.4 26.0 - 34.0 pg   MCHC 34.5 30.0 - 36.0 g/dL   RDW 13.0 86.5 - 78.4 %   Platelets 265 150 - 400 K/uL   nRBC 0.0 0.0 - 0.2 %   Neutrophils Relative % 59 %   Neutro Abs 1.3 (L) 1.7 - 7.7 K/uL   Lymphocytes Relative 10 %   Lymphs Abs 0.2 (L) 0.7 - 4.0 K/uL   Monocytes Relative 28 %   Monocytes Absolute 0.6 0.1 - 1.0 K/uL   Eosinophils Relative 1 %   Eosinophils Absolute 0.0 0.0 - 0.5 K/uL   Basophils Relative 1 %   Basophils Absolute 0.0 0.0 - 0.1 K/uL   Immature Granulocytes 1 %   Abs Immature Granulocytes 0.01 0.00 - 0.07 K/uL   Dohle Bodies PRESENT   Magnesium     Status: Abnormal   Collection Time: 11/03/23  5:00 AM  Result Value Ref Range   Magnesium 2.5 (H) 1.7 - 2.4 mg/dL    I have reviewed pertinent nursing notes, vitals, labs, and images as necessary. I have ordered labwork to follow up on as indicated.  I have reviewed the last notes from staff over past 24 hours. I have discussed patient's care plan and test results with nursing staff,  CM/SW, and other staff as appropriate.  Time spent: Greater than 50% of the 55 minute visit was spent in counseling/coordination of care for the patient as laid out in the A&P.   LOS: 2 days   Harry Chamber,  MD [facility] 11/03/2023, 3:07 PM

## 2023-11-04 ENCOUNTER — Ambulatory Visit
Admission: RE | Admit: 2023-11-04 | Discharge: 2023-11-04 | Disposition: A | Discharge status: Home / Self Care | Payer: BC Managed Care – PPO | Source: Ambulatory Visit | Attending: Radiation Oncology

## 2023-11-04 ENCOUNTER — Other Ambulatory Visit: Payer: Self-pay

## 2023-11-04 ENCOUNTER — Other Ambulatory Visit (HOSPITAL_COMMUNITY): Payer: Self-pay

## 2023-11-04 DIAGNOSIS — C090 Malignant neoplasm of tonsillar fossa: Secondary | ICD-10-CM

## 2023-11-04 DIAGNOSIS — D709 Neutropenia, unspecified: Secondary | ICD-10-CM | POA: Diagnosis not present

## 2023-11-04 DIAGNOSIS — R5081 Fever presenting with conditions classified elsewhere: Secondary | ICD-10-CM | POA: Diagnosis not present

## 2023-11-04 DIAGNOSIS — K1233 Oral mucositis (ulcerative) due to radiation: Secondary | ICD-10-CM | POA: Diagnosis not present

## 2023-11-04 LAB — CBC WITH DIFFERENTIAL/PLATELET
Abs Immature Granulocytes: 0 10*3/uL (ref 0.00–0.07)
Basophils Absolute: 0 10*3/uL (ref 0.0–0.1)
Basophils Relative: 1 %
Eosinophils Absolute: 0 10*3/uL (ref 0.0–0.5)
Eosinophils Relative: 1 %
HCT: 33.6 % — ABNORMAL LOW (ref 39.0–52.0)
Hemoglobin: 11.3 g/dL — ABNORMAL LOW (ref 13.0–17.0)
Immature Granulocytes: 0 %
Lymphocytes Relative: 14 %
Lymphs Abs: 0.2 10*3/uL — ABNORMAL LOW (ref 0.7–4.0)
MCH: 30.7 pg (ref 26.0–34.0)
MCHC: 33.6 g/dL (ref 30.0–36.0)
MCV: 91.3 fL (ref 80.0–100.0)
Monocytes Absolute: 0.5 10*3/uL (ref 0.1–1.0)
Monocytes Relative: 30 %
Neutro Abs: 0.8 10*3/uL — ABNORMAL LOW (ref 1.7–7.7)
Neutrophils Relative %: 54 %
Platelets: 247 10*3/uL (ref 150–400)
RBC: 3.68 MIL/uL — ABNORMAL LOW (ref 4.22–5.81)
RDW: 12.1 % (ref 11.5–15.5)
WBC: 1.6 10*3/uL — ABNORMAL LOW (ref 4.0–10.5)
nRBC: 0 % (ref 0.0–0.2)

## 2023-11-04 LAB — RAD ONC ARIA SESSION SUMMARY
Course Elapsed Days: 38
Plan Fractions Treated to Date: 27
Plan Prescribed Dose Per Fraction: 2 Gy
Plan Total Fractions Prescribed: 35
Plan Total Prescribed Dose: 70 Gy
Reference Point Dosage Given to Date: 54 Gy
Reference Point Session Dosage Given: 2 Gy
Session Number: 27

## 2023-11-04 LAB — BASIC METABOLIC PANEL
Anion gap: 8 (ref 5–15)
BUN: 14 mg/dL (ref 6–20)
CO2: 30 mmol/L (ref 22–32)
Calcium: 9.1 mg/dL (ref 8.9–10.3)
Chloride: 97 mmol/L — ABNORMAL LOW (ref 98–111)
Creatinine, Ser: 0.72 mg/dL (ref 0.61–1.24)
GFR, Estimated: >60 mL/min (ref >60)
Glucose, Bld: 95 mg/dL (ref 70–99)
Potassium: 4.8 mmol/L (ref 3.5–5.1)
Sodium: 135 mmol/L (ref 135–145)

## 2023-11-04 LAB — MAGNESIUM: Magnesium: 2.4 mg/dL (ref 1.7–2.4)

## 2023-11-04 MED ORDER — FENTANYL 50 MCG/HR TD PT72
1.0000 | MEDICATED_PATCH | TRANSDERMAL | Status: DC
  Administered 2023-11-04: 1 via TRANSDERMAL
  Filled 2023-11-04: qty 1

## 2023-11-04 NOTE — Progress Notes (Signed)
Progress Note    Harry Williams   FAO:[id number]  DOB: 1981-03-12  DOA: 11/01/2023     3 PCP: Harry Banana, MD  Initial CC: fever  Hospital Course: Mr. Harry Williams is a 42 yo male with PMH malignant neoplasm of tonsillar fossa being admitted to the hospital with neutropenic fever.  He has been tolerating his chemotherapy, over the last week however he has had worsening oral pain, felt to be due to mucositis.  Also over the last 4 days, he has been having intermittent fevers, sweats, and subjective chills.   He was seen at the oncology clinic and due to fever, he was sent for further workup in the hospital. Cultures obtained and antibiotics initiated on admission.  Interval History:  Remains afebrile.  Pain better controlled on current regimen and we are also starting fentanyl patch today as he will be starting that at discharge as well. Wife present bedside later in the morning and updated.  We discussed monitoring ANC further overnight and tentative plan for discharge tomorrow if remaining stable or improved.  Assessment and Plan: * Neutropenic fever (HCC) - Workup negative for source on admission.  Patient has no urinary or respiratory symptoms. -Continue following cultures; remaining negative - Continue cefepime - Continue neutropenic precautions -Continue trending ANC.  Currently ANC 864 today -Monitor overnight and tentative plan for discharge tomorrow if ANC stable or better.  No need for antibiotics at discharge  Oral mucositis due to radiation therapy - Continue Hycet and lidocaine solutions - Continue Dilaudid for breakthrough -Pain better controlled today, patient okay with current regimen in place -Starting fentanyl patch inpatient as prescribed by oncology to begin at discharge  Malignant neoplasm of tonsillar fossa (HCC) - follows outpatient with Dr. Arlana Williams -Being treated with chemoradiation - continue TF; he is NPO at home, okay for ice chips    Old records  reviewed in assessment of this patient  Antimicrobials: Cefepime 11/01/2023 >> current  DVT prophylaxis:  enoxaparin (LOVENOX) injection 40 mg Start: 11/01/23 2200 SCDs Start: 11/01/23 1649   Code Status:   Code Status: Full Code  Mobility Assessment (Last 72 Hours)     Mobility Assessment     Row Name 11/04/23 0743 11/03/23 2030 11/03/23 0918 11/02/23 0852 11/01/23 2300   Does patient have an order for bedrest or is patient medically unstable No - Continue assessment No - Continue assessment No - Continue assessment No - Continue assessment No - Continue assessment   What is the highest level of mobility based on the progressive mobility assessment? Level 6 (Walks independently in room and hall) - Balance while walking in room without assist - Complete Level 6 (Walks independently in room and hall) - Balance while walking in room without assist - Complete Level 6 (Walks independently in room and hall) - Balance while walking in room without assist - Complete Level 6 (Walks independently in room and hall) - Balance while walking in room without assist - Complete Level 6 (Walks independently in room and hall) - Balance while walking in room without assist - Complete    Row Name 11/01/23 1654           Does patient have an order for bedrest or is patient medically unstable No - Continue assessment       What is the highest level of mobility based on the progressive mobility assessment? Level 6 (Walks independently in room and hall) - Balance while walking in room without assist - Complete  Barriers to discharge:  Disposition Plan: Home Status is: Inpatient  Objective: Blood pressure 125/83, pulse 81, temperature 98.6 F (37 C), temperature source Oral, resp. rate 20, height 5\' 8"  (1.727 m), weight 77.6 kg, SpO2 96%.  Examination:  Physical Exam Constitutional:      General: He is not in acute distress.    Appearance: Normal appearance.  HENT:     Head:  Normocephalic and atraumatic.     Mouth/Throat:     Mouth: Mucous membranes are moist.  Eyes:     Extraocular Movements: Extraocular movements intact.  Cardiovascular:     Rate and Rhythm: Normal rate and regular rhythm.  Pulmonary:     Effort: Pulmonary effort is normal. No respiratory distress.     Breath sounds: Normal breath sounds. No wheezing.  Abdominal:     General: Bowel sounds are normal. There is no distension.     Palpations: Abdomen is soft.     Tenderness: There is no abdominal tenderness.     Comments: G- tube in place  Musculoskeletal:        General: Normal range of motion.     Cervical back: Normal range of motion and neck supple.  Skin:    General: Skin is warm and dry.  Neurological:     General: No focal deficit present.     Mental Status: He is alert.  Psychiatric:        Mood and Affect: Mood normal.        Behavior: Behavior normal.      Consultants:  Oncology  Procedures:    Data Reviewed: Results for orders placed or performed during the hospital encounter of 11/01/23 (from the past 24 hour(s))  Basic metabolic panel     Status: Abnormal   Collection Time: 11/04/23  5:00 AM  Result Value Ref Range   Sodium 135 135 - 145 mmol/L   Potassium 4.8 3.5 - 5.1 mmol/L   Chloride 97 (L) 98 - 111 mmol/L   CO2 30 22 - 32 mmol/L   Glucose, Bld 95 70 - 99 mg/dL   BUN 14 6 - 20 mg/dL   Creatinine, Ser 4.01 0.61 - 1.24 mg/dL   Calcium 9.1 8.9 - 02.7 mg/dL   GFR, Estimated >25 >36 mL/min   Anion gap 8 5 - 15  CBC with Differential/Platelet     Status: Abnormal   Collection Time: 11/04/23  5:00 AM  Result Value Ref Range   WBC 1.6 (L) 4.0 - 10.5 K/uL   RBC 3.68 (L) 4.22 - 5.81 MIL/uL   Hemoglobin 11.3 (L) 13.0 - 17.0 g/dL   HCT 64.4 (L) 03.4 - 74.2 %   MCV 91.3 80.0 - 100.0 fL   MCH 30.7 26.0 - 34.0 pg   MCHC 33.6 30.0 - 36.0 g/dL   RDW 59.5 63.8 - 75.6 %   Platelets 247 150 - 400 K/uL   nRBC 0.0 0.0 - 0.2 %   Neutrophils Relative % 54 %    Neutro Abs 0.8 (L) 1.7 - 7.7 K/uL   Lymphocytes Relative 14 %   Lymphs Abs 0.2 (L) 0.7 - 4.0 K/uL   Monocytes Relative 30 %   Monocytes Absolute 0.5 0.1 - 1.0 K/uL   Eosinophils Relative 1 %   Eosinophils Absolute 0.0 0.0 - 0.5 K/uL   Basophils Relative 1 %   Basophils Absolute 0.0 0.0 - 0.1 K/uL   Immature Granulocytes 0 %   Abs Immature Granulocytes 0.00 0.00 - 0.07 K/uL  Magnesium     Status: None   Collection Time: 11/04/23  5:00 AM  Result Value Ref Range   Magnesium 2.4 1.7 - 2.4 mg/dL    I have reviewed pertinent nursing notes, vitals, labs, and images as necessary. I have ordered labwork to follow up on as indicated.  I have reviewed the last notes from staff over past 24 hours. I have discussed patient's care plan and test results with nursing staff, CM/SW, and other staff as appropriate.     LOS: 3 days   Lewie Chamber, MD [facility] 11/04/2023, 4:14 PM

## 2023-11-04 NOTE — Plan of Care (Signed)
  Problem: Education: Goal: Knowledge of General Education information will improve Description: Including pain rating scale, medication(s)/side effects and non-pharmacologic comfort measures Outcome: Progressing   Problem: Activity: Goal: Risk for activity intolerance will decrease Outcome: Progressing   Problem: Coping: Goal: Level of anxiety will decrease Outcome: Progressing   Problem: Pain Management: Goal: General experience of comfort will improve Outcome: Progressing   Problem: Safety: Goal: Ability to remain free from injury will improve Outcome: Progressing   Problem: Skin Integrity: Goal: Risk for impaired skin integrity will decrease Outcome: Progressing

## 2023-11-04 NOTE — Progress Notes (Signed)
   11/04/23 1049  TOC Brief Assessment  Insurance and Status Reviewed  Patient has primary care physician Yes  Home environment has been reviewed home with spouse  Prior level of function: independent  Prior/Current Home Services No current home services  Social Determinants of Health Reivew SDOH reviewed no interventions necessary  Readmission risk has been reviewed Yes  Transition of care needs no transition of care needs at this time

## 2023-11-04 NOTE — Plan of Care (Signed)

## 2023-11-05 ENCOUNTER — Other Ambulatory Visit (HOSPITAL_COMMUNITY): Payer: Self-pay

## 2023-11-05 DIAGNOSIS — K1233 Oral mucositis (ulcerative) due to radiation: Secondary | ICD-10-CM | POA: Diagnosis not present

## 2023-11-05 DIAGNOSIS — D709 Neutropenia, unspecified: Secondary | ICD-10-CM | POA: Diagnosis not present

## 2023-11-05 DIAGNOSIS — R5081 Fever presenting with conditions classified elsewhere: Secondary | ICD-10-CM | POA: Diagnosis not present

## 2023-11-05 LAB — CBC WITH DIFFERENTIAL/PLATELET
Abs Immature Granulocytes: 0.02 10*3/uL (ref 0.00–0.07)
Basophils Absolute: 0 10*3/uL (ref 0.0–0.1)
Basophils Relative: 1 %
Eosinophils Absolute: 0 10*3/uL (ref 0.0–0.5)
Eosinophils Relative: 1 %
HCT: 35 % — ABNORMAL LOW (ref 39.0–52.0)
Hemoglobin: 11.8 g/dL — ABNORMAL LOW (ref 13.0–17.0)
Immature Granulocytes: 1 %
Lymphocytes Relative: 11 %
Lymphs Abs: 0.2 10*3/uL — ABNORMAL LOW (ref 0.7–4.0)
MCH: 31.2 pg (ref 26.0–34.0)
MCHC: 33.7 g/dL (ref 30.0–36.0)
MCV: 92.6 fL (ref 80.0–100.0)
Monocytes Absolute: 0.5 10*3/uL (ref 0.1–1.0)
Monocytes Relative: 28 %
Neutro Abs: 1 10*3/uL — ABNORMAL LOW (ref 1.7–7.7)
Neutrophils Relative %: 58 %
Platelets: 269 10*3/uL (ref 150–400)
RBC: 3.78 MIL/uL — ABNORMAL LOW (ref 4.22–5.81)
RDW: 12.3 % (ref 11.5–15.5)
WBC: 1.6 10*3/uL — ABNORMAL LOW (ref 4.0–10.5)
nRBC: 0 % (ref 0.0–0.2)

## 2023-11-05 MED ORDER — HEPARIN SOD (PORK) LOCK FLUSH 100 UNIT/ML IV SOLN
500.0000 [IU] | Freq: Once | INTRAVENOUS | Status: AC
  Administered 2023-11-05: 500 [IU] via INTRAVENOUS
  Filled 2023-11-05: qty 5

## 2023-11-05 NOTE — Plan of Care (Signed)
  Problem: Education: Goal: Knowledge of General Education information will improve Description: Including pain rating scale, medication(s)/side effects and non-pharmacologic comfort measures Outcome: Progressing   Problem: Activity: Goal: Risk for activity intolerance will decrease Outcome: Progressing   Problem: Nutrition: Goal: Adequate nutrition will be maintained Outcome: Progressing   Problem: Elimination: Goal: Will not experience complications related to bowel motility Outcome: Progressing   Problem: Pain Management: Goal: General experience of comfort will improve Outcome: Progressing   Problem: Safety: Goal: Ability to remain free from injury will improve Outcome: Progressing

## 2023-11-05 NOTE — Discharge Summary (Signed)
Physician Discharge Summary   Harry Williams ZOX:[id number] DOB: 10/10/1981 DOA: 11/01/2023  PCP: Harry Banana, MD  Admit date: 11/01/2023 Discharge date: 11/05/2023   Admitted From: Home Disposition:  Home Discharging physician: Lewie Chamber, MD Barriers to discharge: none  Recommendations at discharge: Follow up with oncology   Discharge Condition: stable CODE STATUS: Full Diet recommendation:  Diet Orders (From admission, onward)     Start     Ordered   11/02/23 1121  Diet NPO time specified Except for: Ice Chips  Diet effective now       Question:  Except for  Answer:  Ice Chips   11/02/23 1120            Hospital Course: Harry Williams is a 42 yo male with PMH malignant neoplasm of tonsillar fossa being admitted to the hospital with neutropenic fever.  He has been tolerating his chemotherapy, over the last week however he has had worsening oral pain, felt to be due to mucositis.  Also over the last 4 days, he has been having intermittent fevers, sweats, and subjective chills.   He was seen at the oncology clinic and due to fever, he was sent for further workup in the hospital. Cultures obtained and antibiotics initiated on admission. Infectious workup remained negative during hospitalization.  ANC stabilized.  He remained afebrile for over 48 hours prior to discharge.  Assessment and Plan: * Neutropenic fever (HCC)-resolved as of 11/05/2023 - Workup negative for source on admission.  Patient has no urinary or respiratory symptoms. -Continue following cultures; remaining negative -Treated with 4 days cefepime during hospitalization.  No indication to continue antibiotics at discharge - ANC approximately 944 at discharge.  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  Arlana Williams -Being treated with chemoradiation - continue TF; he is NPO at home, okay for ice chips    The patient's acute and chronic medical conditions were treated accordingly. On day of discharge, patient was felt deemed stable for discharge. Patient/family member advised to call PCP or come back to ER if needed.   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     Discharge Instructions     Increase activity slowly   Complete by: As directed       Allergies as of 11/05/2023   No Known Allergies      Medication List     STOP taking these medications    predniSONE 20 MG tablet Commonly known as: DELTASONE   sucralfate 1 g tablet Commonly known as: Carafate   Vitamin D (Ergocalciferol) 1.25 MG (50000 UNIT) Caps capsule Commonly known as: DRISDOL       TAKE these medications    baclofen 10 MG tablet Commonly known as: LIORESAL Take 1 tablet (10 mg total) by mouth 3 (three) times daily.   celecoxib 200 MG capsule Commonly known as: CELEBREX Place 200 mg into feeding tube daily as needed for moderate pain (pain score 4-6).   dexamethasone 4 MG tablet Commonly known as: DECADRON Place 8  mg into feeding tube See admin instructions. 8 mg, per tube, once a day for three days- starting the day after Cisplatin chemotherapy   feeding supplement (OSMOLITE 1.5 CAL) Liqd 7 cartons Osmolite 1.5 split over four feeding daily. Flush tube with 60 ml water before and after each bolus. Provide additional 3 1/2 cups (830 ml) water/day to meet hydration needs. Provides 2485 kcal, 104.3 g protein, 1267 ml free water from formula (2577 ml total water with flushes). 1659 ml/day  meets 100% RDI   fentaNYL 50 MCG/HR Commonly known as: DURAGESIC Place 1 patch onto the skin every 3 (three) days.   HYDROcodone-acetaminophen 7.5-325 mg/15 ml solution Commonly known as: HYCET Take 10 mLs by mouth every 4 (four) hours as needed for moderate pain (pain score 4-6). What changed: Another medication with the same name was removed. Continue taking this medication, and follow the directions you see here.   lidocaine 2 % solution Commonly known as: XYLOCAINE Patient: Mix 1part 2% viscous lidocaine, 1part H20. Swish & swallow 10mL of diluted mixture, before meals and at bedtime, up to QID What changed:  how much to take when to take this additional instructions   lidocaine-prilocaine cream Commonly known as: EMLA Apply to affected area once What changed:  how much to take how to take this when to take this additional instructions   omeprazole 20 MG capsule Commonly known as: PRILOSEC 20 mg See admin instructions. 20 mg per tube once a day What changed: Another medication with the same name was removed. Continue taking this medication, and follow the directions you see here.   ondansetron 8 MG tablet Commonly known as: Zofran Take 1 tablet (8 mg total) by mouth every 8 (eight) hours as needed for nausea or vomiting. Start on the third day after cisplatin. What changed: how to take this   PARoxetine 12.5 MG 24 hr tablet Commonly known as: Paxil CR Take 1 tablet (12.5 mg total) by mouth daily.   prochlorperazine 10 MG tablet Commonly known as: COMPAZINE Take 1 tablet (10 mg total) by mouth every 6 (six) hours as needed (Nausea or vomiting). What changed:  how to take this reasons to take this   scopolamine 1 MG/3DAYS Commonly known as: TRANSDERM-SCOP Place 1 patch (1.5 mg total) onto the skin every 3 (three) days.   Vitamin D3 1000 units Caps Place 1,000 Units into feeding tube daily.        No Known  Allergies  Consultations: Oncology  Procedures:   Discharge Exam: BP 120/77 (BP Location: Left Arm)   Pulse 85   Temp 98.7 F (37.1 C) (Oral)   Resp 14   Ht 5\' 8"  (1.727 m)   Wt 77.6 kg   SpO2 99%   BMI 26.00 kg/m  Physical Exam Constitutional:      General: He is not in acute distress.    Appearance: Normal appearance.  HENT:     Head: Normocephalic and atraumatic.     Mouth/Throat:     Mouth: Mucous membranes are moist.  Eyes:     Extraocular Movements: Extraocular movements intact.  Cardiovascular:     Rate and Rhythm: Normal rate and regular rhythm.  Pulmonary:     Effort: Pulmonary effort is normal. No respiratory distress.     Breath sounds: Normal breath sounds. No wheezing.  Abdominal:     General: Bowel sounds are normal. There is no distension.     Palpations: Abdomen is soft.     Tenderness: There  is no abdominal tenderness.     Comments: G- tube in place  Musculoskeletal:        General: Normal range of motion.     Cervical back: Normal range of motion and neck supple.  Skin:    General: Skin is warm and dry.  Neurological:     General: No focal deficit present.     Mental Status: He is alert.  Psychiatric:        Mood and Affect: Mood normal.        Behavior: Behavior normal.      The results of significant diagnostics from this hospitalization (including imaging, microbiology, ancillary and laboratory) are listed below for reference.   Microbiology: Recent Results (from the past 240 hour(s))  Urine Culture     Status: None   Collection Time: 11/01/23 10:24 AM   Specimen: Urine, Clean Catch  Result Value Ref Range Status   Specimen Description   Final    URINE, CLEAN CATCH Performed at [facility], [address] [place], [place] [address]    Special Requests   Final    NONE Performed at [facility], [address] [place], [place] [address]    Culture   Final    NO  GROWTH Performed at [facility], [address], [place], [place] [address]    Report Status 11/02/2023 FINAL  Final  Culture, blood (routine x 2)     Status: None (Preliminary result)   Collection Time: 11/01/23 12:32 PM   Specimen: BLOOD LEFT ARM  Result Value Ref Range Status   Specimen Description BLOOD LEFT ARM  Final   Special Requests   Final    BOTTLES DRAWN AEROBIC AND ANAEROBIC Blood Culture results may not be optimal due to an inadequate volume of blood received in culture bottles   Culture   Final    NO GROWTH 4 DAYS Performed at [facility], [address] [place], [place] [address]    Report Status PENDING  Incomplete  Resp panel by RT-PCR (RSV, Flu A&B, Covid) Anterior Nasal Swab     Status: None   Collection Time: 11/01/23 12:32 PM   Specimen: Anterior Nasal Swab  Result Value Ref Range Status   SARS Coronavirus 2 by RT PCR NEGATIVE NEGATIVE Final    Comment: (NOTE) SARS-CoV-2 target nucleic acids are NOT DETECTED.  The SARS-CoV-2 RNA is generally detectable in upper respiratory specimens during the acute phase of infection. The lowest concentration of SARS-CoV-2 viral copies this assay can detect is 138 copies/mL. A negative result does not preclude SARS-Cov-2 infection and should not be used as the sole basis for treatment or other patient management decisions. A negative result may occur with  improper specimen collection/handling, submission of specimen other than nasopharyngeal swab, presence of viral mutation(s) within the areas targeted by this assay, and inadequate number of viral copies(<138 copies/mL). A negative result must be combined with clinical observations, patient history, and epidemiological information. The expected result is Negative.  Fact Sheet for Patients:  BloggerCourse.com  Fact Sheet for Healthcare Providers:  SeriousBroker.it  This test is no t yet approved or  cleared by the [place] FDA and  has been authorized for detection and/or diagnosis of SARS-CoV-2 by FDA under an Emergency Use Authorization (EUA). This EUA will remain  in effect (meaning this test can be used) for the duration of the COVID-19 declaration under Section 564(b)(1) of the Act, 21  U.S.C.section 360bbb-3(b)(1), unless the authorization is terminated  or revoked sooner.       Influenza A by PCR NEGATIVE NEGATIVE Final   Influenza B by PCR NEGATIVE NEGATIVE Final    Comment: (NOTE) The Xpert Xpress SARS-CoV-2/FLU/RSV plus assay is intended as an aid in the diagnosis of influenza from Nasopharyngeal swab specimens and should not be used as a sole basis for treatment. Nasal washings and aspirates are unacceptable for Xpert Xpress SARS-CoV-2/FLU/RSV testing.  Fact Sheet for Patients: BloggerCourse.com  Fact Sheet for Healthcare Providers: SeriousBroker.it  This test is not yet approved or cleared by the [place] FDA and has been authorized for detection and/or diagnosis of SARS-CoV-2 by FDA under an Emergency Use Authorization (EUA). This EUA will remain in effect (meaning this test can be used) for the duration of the COVID-19 declaration under Section 564(b)(1) of the Act, 21 U.S.C. section 360bbb-3(b)(1), unless the authorization is terminated or revoked.     Resp Syncytial Virus by PCR NEGATIVE NEGATIVE Final    Comment: (NOTE) Fact Sheet for Patients: BloggerCourse.com  Fact Sheet for Healthcare Providers: SeriousBroker.it  This test is not yet approved or cleared by the [place] FDA and has been authorized for detection and/or diagnosis of SARS-CoV-2 by FDA under an Emergency Use Authorization (EUA). This EUA will remain in effect (meaning this test can be used) for the duration of the COVID-19 declaration under Section 564(b)(1) of the Act, 21  U.S.C. section 360bbb-3(b)(1), unless the authorization is terminated or revoked.  Performed at [facility], [address], [place], [place] [address]   Culture, blood (routine x 2)     Status: None (Preliminary result)   Collection Time: 11/01/23 12:38 PM   Specimen: BLOOD RIGHT ARM  Result Value Ref Range Status   Specimen Description BLOOD RIGHT ARM  Final   Special Requests   Final    BOTTLES DRAWN AEROBIC AND ANAEROBIC Blood Culture results may not be optimal due to an inadequate volume of blood received in culture bottles   Culture   Final    NO GROWTH 4 DAYS Performed at [facility], [address] [place], [place] [address]    Report Status PENDING  Incomplete     Labs: BNP (last 3 results) No results for input(s): "BNP" in the last 8760 hours. Basic Metabolic Panel: Recent Labs  Lab 11/01/23 1024 11/02/23 0500 11/03/23 0500 11/04/23 0500  NA 132* 133* 134* 135  K 4.3 4.1 4.2 4.8  CL 94* 97* 96* 97*  CO2 33* 28 31 30   GLUCOSE 145* 169* 100* 95  BUN 13 15 13 14   CREATININE 0.80 0.81 0.76 0.72  CALCIUM 9.3 8.7* 8.8* 9.1  MG  --   --  2.5* 2.4   Liver Function Tests: Recent Labs  Lab 11/01/23 1024  AST 34  ALT 70*  ALKPHOS 112  BILITOT 0.4  PROT 7.2  ALBUMIN 3.8   No results for input(s): "LIPASE", "AMYLASE" in the last 168 hours. No results for input(s): "AMMONIA" in the last 168 hours. CBC: Recent Labs  Lab 11/01/23 1024 11/02/23 0500 11/03/23 0500 11/04/23 0500 11/05/23 0608  WBC 1.8* 2.1* 2.1* 1.6* 1.6*  NEUTROABS 1.0*  --  1.3* 0.8* 1.0*  HGB 12.2* 11.6* 11.2* 11.3* 11.8*  HCT 35.5* 34.0* 32.5* 33.6* 35.0*  MCV 89.4 90.9 91.0 91.3 92.6  PLT 196 190 265 247 269   Cardiac Enzymes: No results for input(s): "CKTOTAL", "CKMB", "CKMBINDEX", "TROPONINI" in the  last 168 hours. BNP: Invalid input(s): "POCBNP" CBG: No results for input(s): "GLUCAP" in the last 168 hours. D-Dimer No results for input(s): "DDIMER"  in the last 72 hours. Hgb A1c No results for input(s): "HGBA1C" in the last 72 hours. Lipid Profile No results for input(s): "CHOL", "HDL", "LDLCALC", "TRIG", "CHOLHDL", "LDLDIRECT" in the last 72 hours. Thyroid function studies No results for input(s): "TSH", "T4TOTAL", "T3FREE", "THYROIDAB" in the last 72 hours.  Invalid input(s): "FREET3" Anemia work up No results for input(s): "VITAMINB12", "FOLATE", "FERRITIN", "TIBC", "IRON", "RETICCTPCT" in the last 72 hours. Urinalysis    Component Value Date/Time   COLORURINE YELLOW 11/01/2023 1024   APPEARANCEUR CLEAR 11/01/2023 1024   LABSPEC 1.027 11/01/2023 1024   PHURINE 7.0 11/01/2023 1024   GLUCOSEU 50 (A) 11/01/2023 1024   HGBUR NEGATIVE 11/01/2023 1024   BILIRUBINUR NEGATIVE 11/01/2023 1024   KETONESUR NEGATIVE 11/01/2023 1024   PROTEINUR 30 (A) 11/01/2023 1024   NITRITE NEGATIVE 11/01/2023 1024   LEUKOCYTESUR NEGATIVE 11/01/2023 1024   Sepsis Labs Recent Labs  Lab 11/02/23 0500 11/03/23 0500 11/04/23 0500 11/05/23 0608  WBC 2.1* 2.1* 1.6* 1.6*   Microbiology Recent Results (from the past 240 hour(s))  Urine Culture     Status: None   Collection Time: 11/01/23 10:24 AM   Specimen: Urine, Clean Catch  Result Value Ref Range Status   Specimen Description   Final    URINE, CLEAN CATCH Performed at [facility], [address], [place], [place] [address]    Special Requests   Final    NONE Performed at [facility], [address], [place], [place] [address]    Culture   Final    NO GROWTH Performed at [facility], [address], [place], [place] [address]    Report Status 11/02/2023 FINAL  Final  Culture, blood (routine x 2)     Status: None (Preliminary result)   Collection Time: 11/01/23 12:32 PM   Specimen: BLOOD LEFT ARM  Result Value Ref Range Status   Specimen Description BLOOD LEFT ARM  Final   Special Requests   Final    BOTTLES DRAWN AEROBIC  AND ANAEROBIC Blood Culture results may not be optimal due to an inadequate volume of blood received in culture bottles   Culture   Final    NO GROWTH 4 DAYS Performed at [facility], [address] [place], [place] [address]    Report Status PENDING  Incomplete  Resp panel by RT-PCR (RSV, Flu A&B, Covid) Anterior Nasal Swab     Status: None   Collection Time: 11/01/23 12:32 PM   Specimen: Anterior Nasal Swab  Result Value Ref Range Status   SARS Coronavirus 2 by RT PCR NEGATIVE NEGATIVE Final    Comment: (NOTE) SARS-CoV-2 target nucleic acids are NOT DETECTED.  The SARS-CoV-2 RNA is generally detectable in upper respiratory specimens during the acute phase of infection. The lowest concentration of SARS-CoV-2 viral copies this assay can detect is 138 copies/mL. A negative result does not preclude SARS-Cov-2 infection and should not be used as the sole basis for treatment or other patient management decisions. A negative result may occur with  improper specimen collection/handling, submission of specimen other than nasopharyngeal swab, presence of viral mutation(s) within the areas targeted by this assay, and inadequate number of viral copies(<138 copies/mL). A negative result must be combined with clinical observations, patient history, and epidemiological information. The expected result is Negative.  Fact  Sheet for Patients:  BloggerCourse.com  Fact Sheet for Healthcare Providers:  SeriousBroker.it  This test is no t yet approved or cleared by the [place] FDA and  has been authorized for detection and/or diagnosis of SARS-CoV-2 by FDA under an Emergency Use Authorization (EUA). This EUA will remain  in effect (meaning this test can be used) for the duration of the COVID-19 declaration under Section 564(b)(1) of the Act, 21 U.S.C.section 360bbb-3(b)(1), unless the authorization is terminated  or revoked sooner.        Influenza A by PCR NEGATIVE NEGATIVE Final   Influenza B by PCR NEGATIVE NEGATIVE Final    Comment: (NOTE) The Xpert Xpress SARS-CoV-2/FLU/RSV plus assay is intended as an aid in the diagnosis of influenza from Nasopharyngeal swab specimens and should not be used as a sole basis for treatment. Nasal washings and aspirates are unacceptable for Xpert Xpress SARS-CoV-2/FLU/RSV testing.  Fact Sheet for Patients: BloggerCourse.com  Fact Sheet for Healthcare Providers: SeriousBroker.it  This test is not yet approved or cleared by the [place] FDA and has been authorized for detection and/or diagnosis of SARS-CoV-2 by FDA under an Emergency Use Authorization (EUA). This EUA will remain in effect (meaning this test can be used) for the duration of the COVID-19 declaration under Section 564(b)(1) of the Act, 21 U.S.C. section 360bbb-3(b)(1), unless the authorization is terminated or revoked.     Resp Syncytial Virus by PCR NEGATIVE NEGATIVE Final    Comment: (NOTE) Fact Sheet for Patients: BloggerCourse.com  Fact Sheet for Healthcare Providers: SeriousBroker.it  This test is not yet approved or cleared by the [place] FDA and has been authorized for detection and/or diagnosis of SARS-CoV-2 by FDA under an Emergency Use Authorization (EUA). This EUA will remain in effect (meaning this test can be used) for the duration of the COVID-19 declaration under Section 564(b)(1) of the Act, 21 U.S.C. section 360bbb-3(b)(1), unless the authorization is terminated or revoked.  Performed at [facility], [address] [place], [place] [address]   Culture, blood (routine x 2)     Status: None (Preliminary result)   Collection Time: 11/01/23 12:38 PM   Specimen: BLOOD RIGHT ARM  Result Value Ref Range Status   Specimen Description BLOOD RIGHT ARM  Final    Special Requests   Final    BOTTLES DRAWN AEROBIC AND ANAEROBIC Blood Culture results may not be optimal due to an inadequate volume of blood received in culture bottles   Culture   Final    NO GROWTH 4 DAYS Performed at [facility], [address], [place], [place] [address]    Report Status PENDING  Incomplete    Procedures/Studies: DG Chest 2 View  Result Date: 11/01/2023 CLINICAL DATA:  Fever. EXAM: CHEST - 2 VIEW COMPARISON:  02/14/2019 FINDINGS: Interval Port-A-Cath placement with catheter tip in the distal SVC. The heart size and mediastinal contours are within normal limits. There may be some mild bilateral perihilar bronchial thickening, especially on the lateral view. This may be consistent with bronchitis. There is no evidence of pulmonary edema, consolidation, pneumothorax, nodule or pleural fluid. The visualized skeletal structures are unremarkable. IMPRESSION: Possible mild perihilar bronchial thickening bilaterally which may be consistent with acute bronchitis. Electronically Signed   By: Irish Lack M.D.   On: 11/01/2023 10:59     Time coordinating discharge: Over 30 minutes    Lewie Chamber, MD  [facility] 11/05/2023, 4:54 PM

## 2023-11-06 LAB — CULTURE, BLOOD (ROUTINE X 2)
Culture: NO GROWTH
Culture: NO GROWTH

## 2023-11-07 ENCOUNTER — Ambulatory Visit (INDEPENDENT_AMBULATORY_CARE_PROVIDER_SITE_OTHER): Payer: Self-pay | Admitting: Licensed Clinical Social Worker

## 2023-11-07 ENCOUNTER — Other Ambulatory Visit: Payer: Self-pay

## 2023-11-07 ENCOUNTER — Ambulatory Visit
Admission: RE | Admit: 2023-11-07 | Discharge: 2023-11-07 | Disposition: A | Discharge status: Home / Self Care | Payer: BC Managed Care – PPO | Source: Ambulatory Visit | Attending: Radiation Oncology

## 2023-11-07 ENCOUNTER — Ambulatory Visit: Payer: BC Managed Care – PPO

## 2023-11-07 DIAGNOSIS — F419 Anxiety disorder, unspecified: Secondary | ICD-10-CM

## 2023-11-07 DIAGNOSIS — C090 Malignant neoplasm of tonsillar fossa: Secondary | ICD-10-CM | POA: Diagnosis present

## 2023-11-07 DIAGNOSIS — F4310 Post-traumatic stress disorder, unspecified: Secondary | ICD-10-CM

## 2023-11-07 DIAGNOSIS — F331 Major depressive disorder, recurrent, moderate: Secondary | ICD-10-CM

## 2023-11-07 DIAGNOSIS — F411 Generalized anxiety disorder: Secondary | ICD-10-CM

## 2023-11-07 LAB — RAD ONC ARIA SESSION SUMMARY
Course Elapsed Days: 41
Plan Fractions Treated to Date: 28
Plan Prescribed Dose Per Fraction: 2 Gy
Plan Total Fractions Prescribed: 35
Plan Total Prescribed Dose: 70 Gy
Reference Point Dosage Given to Date: 56 Gy
Reference Point Session Dosage Given: 2 Gy
Session Number: 28

## 2023-11-07 NOTE — Progress Notes (Signed)
Patient did not show for appointment.   

## 2023-11-08 ENCOUNTER — Other Ambulatory Visit: Payer: Self-pay

## 2023-11-08 ENCOUNTER — Other Ambulatory Visit: Payer: Self-pay | Admitting: Oncology

## 2023-11-08 ENCOUNTER — Telehealth: Payer: Self-pay | Admitting: Oncology

## 2023-11-08 ENCOUNTER — Ambulatory Visit
Admission: RE | Admit: 2023-11-08 | Discharge: 2023-11-08 | Disposition: A | Discharge status: Home / Self Care | Payer: BC Managed Care – PPO | Source: Ambulatory Visit | Attending: Radiation Oncology | Admitting: Radiation Oncology

## 2023-11-08 DIAGNOSIS — C090 Malignant neoplasm of tonsillar fossa: Secondary | ICD-10-CM

## 2023-11-08 LAB — RAD ONC ARIA SESSION SUMMARY
Course Elapsed Days: 42
Plan Fractions Treated to Date: 29
Plan Prescribed Dose Per Fraction: 2 Gy
Plan Total Fractions Prescribed: 35
Plan Total Prescribed Dose: 70 Gy
Reference Point Dosage Given to Date: 58 Gy
Reference Point Session Dosage Given: 2 Gy
Session Number: 29

## 2023-11-08 NOTE — Telephone Encounter (Signed)
Called patient and spoke with wife who stated patient would like to speak with Dr. [name] before treatment is scheduled. Telephone visit scheduled for patient for 12/11 @1040 .Marland KitchenMarland Kitchen

## 2023-11-09 ENCOUNTER — Encounter: Payer: Self-pay | Admitting: Oncology

## 2023-11-09 ENCOUNTER — Encounter: Payer: Self-pay | Admitting: Physical Therapy

## 2023-11-09 ENCOUNTER — Inpatient Hospital Stay (HOSPITAL_BASED_OUTPATIENT_CLINIC_OR_DEPARTMENT_OTHER): Payer: BC Managed Care – PPO | Admitting: Oncology

## 2023-11-09 ENCOUNTER — Ambulatory Visit: Payer: BC Managed Care – PPO | Attending: Radiation Oncology | Admitting: Physical Therapy

## 2023-11-09 ENCOUNTER — Ambulatory Visit
Admission: RE | Admit: 2023-11-09 | Discharge: 2023-11-09 | Disposition: A | Discharge status: Home / Self Care | Payer: BC Managed Care – PPO | Source: Ambulatory Visit | Attending: Radiation Oncology | Admitting: Radiation Oncology

## 2023-11-09 ENCOUNTER — Other Ambulatory Visit: Payer: Self-pay

## 2023-11-09 DIAGNOSIS — R293 Abnormal posture: Secondary | ICD-10-CM | POA: Insufficient documentation

## 2023-11-09 DIAGNOSIS — C090 Malignant neoplasm of tonsillar fossa: Secondary | ICD-10-CM

## 2023-11-09 LAB — RAD ONC ARIA SESSION SUMMARY
Course Elapsed Days: 43
Plan Fractions Treated to Date: 30
Plan Prescribed Dose Per Fraction: 2 Gy
Plan Total Fractions Prescribed: 35
Plan Total Prescribed Dose: 70 Gy
Reference Point Dosage Given to Date: 60 Gy
Reference Point Session Dosage Given: 2 Gy
Session Number: 30

## 2023-11-09 NOTE — Progress Notes (Signed)
[facility]  HEMATOLOGY-ONCOLOGY ELECTRONIC VISIT PROGRESS NOTE  Patient Care Team: Harry Banana, MD as PCP - General (Family Medicine) Harry Gens, MD as Referring Physician (Otolaryngology) Harry Peak, MD as Attending Physician (Radiation Oncology) Williams, Harry Auer, RN as Oncology Nurse Navigator Harry Crutch, MD as Consulting Physician (Oncology)  I connected with the patient via telephone conference and verified that I am speaking with the correct person using two identifiers. The patient's location is at home and I am providing care from the [facility].  I discussed the limitations, risks, security and privacy concerns of performing an evaluation and management service by e-visits and the availability of in person appointments. I also discussed with the patient that there may be a patient responsible charge related to this service. The patient expressed understanding and agreed to proceed.   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 Currently receiving concurrent chemoradiation with weekly cisplatin starting from 09/29/2023.   Malignant neoplasm of tonsillar fossa (HCC) - Recently diagnosed left tonsillar squamous cell carcinoma with a 3 cm lymph node. cT2,cN1,cM0, p16+.   - Previously discussed the benefits and risks of radiation alone vs combination of chemotherapy and radiation. Given the patient's age and the size of the lymph node which is borderline at 3 cm, the benefits of adding chemotherapy slightly outweigh the risks. After discussing side effect profile, patient did opt for weekly cisplatin.  Plan made to proceed with weekly cisplatin at 40 mg/m dose during the  course of radiation with appropriate pre and post hydration and IV nausea medications.  -He started radiation treatments from 09/27/2023.  Started first dose of cisplatin on 09/29/2023.  -On the same day that he received first dose of cisplatin, he developed visual changes, described as a "3D" effect and double vision. Although very rare, optic neuritis has been reported with cisplatin use on literature review, especially when used in combination with other chemotherapeutic agents.  Though it is rare to happen with just 1 dose of cisplatin, given his symptoms, an ophthalmology referral was placed and we held Cisplatin on 10/05/23. Dr. Vanessa Williams with ophthalmology evaluated patient on 10/06/2023.  No signs of optic neuritis. Patient was cleared to resume cisplatin.  - We resumed cisplatin from 10/14/2023 and continued this weekly during the course of radiation.  -He received total of 4 doses of cisplatin so far.  -He was hospitalized on 11/01/2023 for neutropenic fever, pain from mucositis.  ANC slowly improved.  Infectious workup was negative.  He was discharged home on 11/05/2023.  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  He is relying on his feeding tube mostly.  -Given issues with neutropenia and the fact that his mucositis is getting worse with chemotherapy, patient would like to take a break from chemo this week.  He is scheduled to complete radiation treatments on 11/16/2023.  Hence we will defer further chemo at this time.  He was advised to continue radiation treatments as planned.  -We will arrange for labs tomorrow and again as needed.  I will plan to see him approximately 4 weeks after he completes radiation treatments for routine follow-up.   Orders Placed This Encounter  Procedures   CBC with  Differential/Platelet    Standing Status:   Future    Standing Expiration Date:   11/08/2024   Comprehensive metabolic panel     Standing Status:   Future    Standing Expiration Date:   11/08/2024   Magnesium    Standing Status:   Future    Standing Expiration Date:   11/08/2024    INTERVAL HISTORY:  Please see above for problem oriented charting.  The purpose of today's discussion is to formulate plan of care.  He has been experiencing mouth sores and discomfort, which have recently improved. However, he expresses concern about the potential for these symptoms to worsen if chemotherapy is resumed. He also expresses concern about the impact of chemotherapy on his white blood cell count. Despite the discomfort, Harry Williams has begun to attempt drinking water orally, although he still relies on a feeding tube for nutrition. He reports no fevers, chills, night sweats, vision problems, or tingling/numbness.  SUMMARY OF ONCOLOGIC HISTORY:  Oncology History  Malignant neoplasm of tonsillar fossa (HCC)  07/20/2023 Imaging   CT neck soft tissue: 2.5 x 3 cm mass in the left tonsillar region with extension towards the tongue base, suspicious for carcinoma, and evidence of level 2 lymphadenopathy on the left, consisting of a dominant node measuring 3 x 2 x 1.7 cm. A benign appearing osteoma was also demonstrated, projecting inferior from the angle of the left mandible.    08/26/2023 Initial Diagnosis   Malignant neoplasm of tonsillar fossa [facility]):  He presented to his PCP for routine follow-up in July 2024 and was incidentally noted to have left submandibular swelling on a routine examination performed at that time. A soft tissue head and neck ultrasound was subsequently performed on 07/07/23 which showed an ovoid mass measuring 2.8 x 1.9 cm, most consistent with an abnormally enlarged lymph node, and correlating with the palpable area in the left submandibular region.    A soft tissue neck CT was also performed on 07/20/23 which demonstrated: a  2.5 x 3 cm mass in the left tonsillar region with extension towards the tongue base,  suspicious for carcinoma, and evidence of level 2 lymphadenopathy on the left, consisting of a dominant node measuring 3 x 2 x 1.7 cm. A benign appearing osteoma was also demonstrated, projecting inferior from the angle of the left mandible.   Subsequently, the patient was referred to Dr. Roma Schanz at [facility] and underwent biopsies of the left tonsil on 08/26/23. 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    08/26/2023 Pathology Results   Patient was referred to Dr. Roma Schanz at [facility] and underwent biopsies of the left tonsil on 08/26/23. 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    09/07/2023 PET scan   PET scan performed on 09/07/23 at [facility]: intense radiotracer uptake within the left palatine tonsillar mass; adjacent radiotracer uptake within the region of the right palatine tonsil; and hypermetabolic nodes in the left neck extending to the supraclavicular region concerning for nodal metastases. Focal uptake at the anorectal junction was also demonstrated, possibly reflecting muscular contraction, however local malignancy can not be excluded. PET otherwise showed no evidence of more distant metastatic disease.    09/14/2023 Cancer Staging   Staging form: Pharynx - HPV-Mediated Oropharynx, AJCC 8th Edition - Clinical stage from 09/14/2023: Stage I (cT2, cN1, cM0, p16+) - Signed by Harry Peak, MD on 09/14/2023 Stage prefix: Initial diagnosis   09/29/2023 -  Chemotherapy   Patient is on Treatment Plan : HEAD/NECK Cisplatin (40) q7d       REVIEW OF SYSTEMS:    Review of Systems - Oncology  All other pertinent systems were reviewed with the patient and are negative.  I have reviewed the past medical history, past surgical history, social history and family history with the patient and they are unchanged from previous note.  ALLERGIES:  He has No Known  Allergies.  MEDICATIONS:  Current Outpatient Medications  Medication Sig Dispense Refill   baclofen (LIORESAL) 10 MG tablet Take 1 tablet (10 mg total) by mouth 3 (three) times daily. (Patient not taking: Reported on 11/01/2023) 30 each 0   celecoxib (CELEBREX) 200 MG capsule Place 200 mg into feeding tube daily as needed for moderate pain (pain score 4-6).     Cholecalciferol (VITAMIN D3) 1000 units CAPS Place 1,000 Units into feeding tube daily.     dexamethasone (DECADRON) 4 MG tablet Place 8 mg into feeding tube See admin instructions. 8 mg, per tube, once a day for three days- starting the day after Cisplatin chemotherapy     fentaNYL (DURAGESIC) 50 MCG/HR Place 1 patch onto the skin every 3 (three) days. 10 patch 0   HYDROcodone-acetaminophen (HYCET) 7.5-325 mg/15 ml solution Take 10 mLs by mouth every 4 (four) hours as needed for moderate pain (pain score 4-6). (Patient not taking: Reported on 11/01/2023) 500 mL 0   lidocaine (XYLOCAINE) 2 % solution Patient: Mix 1part 2% viscous lidocaine, 1part H20. Swish & swallow 10mL of diluted mixture, before meals and at bedtime, up to QID (Patient taking differently: 10 mLs See admin instructions. Mix 1 part 2% viscous lidocaine with 1 part water. Swish & swallow 10 ml's of diluted mixture up to 4 times a day.) 200 mL 3   lidocaine-prilocaine (EMLA) cream Apply to affected area once (Patient taking differently: Apply 1 Application topically once.) 30 g 3   Nutritional Supplements (FEEDING SUPPLEMENT, OSMOLITE 1.5 CAL,) LIQD 7 cartons Osmolite 1.5 split over four feeding daily. Flush tube with 60 ml water before and after each bolus. Provide additional 3 1/2 cups (830 ml) water/day to meet hydration needs. Provides 2485 kcal, 104.3 g protein, 1267 ml free water from formula (2577 ml total water with flushes). 1659 ml/day meets 100% RDI     omeprazole (PRILOSEC) 20 MG capsule 20 mg See admin instructions. 20 mg per tube once a day     ondansetron  (ZOFRAN) 8 MG tablet Take 1 tablet (8 mg total) by mouth every 8 (eight) hours as needed for nausea or vomiting. Start on the third day after cisplatin. (Patient taking differently: Place 8 mg into feeding tube every 8 (eight) hours as needed for nausea or vomiting. Start on the third day after cisplatin.) 30 tablet 1   PARoxetine (PAXIL CR) 12.5 MG 24 hr tablet Take 1 tablet (12.5 mg total) by mouth daily. (Patient not taking: Reported on 11/01/2023) 30 tablet 0   prochlorperazine (COMPAZINE) 10 MG tablet Take 1 tablet (10 mg total) by mouth every 6 (six) hours as needed (Nausea or vomiting). (Patient taking differently: Place 10 mg into feeding tube every 6 (six) hours as needed for nausea or vomiting.) 30 tablet 1   scopolamine (TRANSDERM-SCOP) 1 MG/3DAYS Place 1 patch (1.5 mg total) onto the skin every 3 (three) days. 10 patch 2   No current facility-administered medications for this visit.    PHYSICAL EXAMINATION: ECOG PERFORMANCE STATUS: 1 - Symptomatic but completely  ambulatory  LABORATORY DATA:   I have reviewed the data as listed.      Latest Ref Rng & Units 11/04/2023    5:00 AM 11/03/2023    5:00 AM 11/02/2023    5:00 AM  CMP  Glucose 70 - 99 mg/dL 95  161  096   BUN 6 - 20 mg/dL 14  13  15    Creatinine 0.61 - 1.24 mg/dL 0.45  4.09  8.11   Sodium 135 - 145 mmol/L 135  134  133   Potassium 3.5 - 5.1 mmol/L 4.8  4.2  4.1   Chloride 98 - 111 mmol/L 97  96  97   CO2 22 - 32 mmol/L 30  31  28    Calcium 8.9 - 10.3 mg/dL 9.1  8.8  8.7     Lab Results  Component Value Date   WBC 1.6 (L) 11/05/2023   HGB 11.8 (L) 11/05/2023   HCT 35.0 (L) 11/05/2023   MCV 92.6 11/05/2023   PLT 269 11/05/2023   NEUTROABS 1.0 (L) 11/05/2023     RADIOGRAPHIC STUDIES:  I have personally reviewed the radiological images as listed and agree with the findings in the report.  DG Chest 2 View  Result Date: 11/01/2023 CLINICAL DATA:  Fever. EXAM: CHEST - 2 VIEW COMPARISON:  02/14/2019 FINDINGS:  Interval Port-A-Cath placement with catheter tip in the distal SVC. The heart size and mediastinal contours are within normal limits. There may be some mild bilateral perihilar bronchial thickening, especially on the lateral view. This may be consistent with bronchitis. There is no evidence of pulmonary edema, consolidation, pneumothorax, nodule or pleural fluid. The visualized skeletal structures are unremarkable. IMPRESSION: Possible mild perihilar bronchial thickening bilaterally which may be consistent with acute bronchitis. Electronically Signed   By: Irish Lack M.D.   On: 11/01/2023 10:59    I discussed the assessment and treatment plan with the patient. The patient was provided an opportunity to ask questions and all were answered. The patient agreed with the plan and demonstrated an understanding of the instructions. The patient was advised to call back or seek an in-person evaluation if the symptoms worsen or if the condition fails to improve as anticipated.    I spent 20 minutes for the appointment reviewing test results, discuss management and coordination of care.  Harry Crutch, MD 11/09/2023 1:23 PM [facility] - A DEPT OF [facility] [address] [place] [address] Dept: [phone] Dept Fax: [phone]   Future Appointments  Date Time Provider Department Center  11/09/2023  4:00 PM [facility] 4 [facility] None  11/10/2023  3:15 PM Noreene Larsson, RD [facility] None  11/10/2023  4:00 PM [facility] 4 [facility] None  11/11/2023  4:00 PM [facility] 4 [facility] None  11/14/2023  4:00 PM [facility] 4 [facility] None  11/15/2023  4:00 PM [facility] 4 [facility] None  11/16/2023  4:00 PM [facility] 4 [facility] None  12/02/2023  8:45 AM Barron Alvine, CCC-SLP [facility]  12/08/2023  8:00 AM Jeanella Craze, Blaire L, PT [facility] None  12/20/2023 11:00  AM Mollie Germany, LCSW [facility] None  01/09/2024 10:00 AM Rennis Chris, MD [facility] None  06/20/2024  8:40 AM Harry Banana, MD [facility]    This document was completed utilizing speech recognition software. Grammatical errors, random word insertions, pronoun errors, and incomplete sentences are an occasional consequence of this system  due to software limitations, ambient noise, and hardware issues. Any formal questions or concerns about the content, text or information contained within the body of this dictation should be directly addressed to the provider for clarification.

## 2023-11-09 NOTE — Therapy (Signed)
OUTPATIENT PHYSICAL THERAPY HEAD AND NECK BASELINE EVALUATION   Patient Name: Harry Williams MRN: [id number] DOB:Jul 10, 1981, 42 y.o., male Today's Date: 11/09/2023  END OF SESSION:  PT End of Session - 11/09/23 1232     Visit Number 1    Number of Visits 2    Date for PT Re-Evaluation 12/07/23    PT Start Time 1202    PT Stop Time 1230    PT Time Calculation (min) 28 min    Activity Tolerance Patient tolerated treatment well    Behavior During Therapy [facility] for tasks assessed/performed             Past Medical History:  Diagnosis Date   Anxiety    Asthma    Avascular necrosis of hip, left (HCC) 07/24/2021   Bright red blood per rectum 12/16/2022   Closed fracture of nasal bone with routine healing 12/24/2016   Depression    PTSD (post-traumatic stress disorder)    Past Surgical History:  Procedure Laterality Date   APPENDECTOMY     COLON SURGERY  03/30/2015   IR GASTROSTOMY TUBE MOD SED  09/22/2023   IR IMAGING GUIDED PORT INSERTION  09/22/2023   JOINT REPLACEMENT  2008   partial right elbow; right femoral graft   Total abdominal proctocolectomy, J-pouch, ileal pouch anal anastomosis  2016   Patient Active Problem List   Diagnosis Date Noted   Oral mucositis due to radiation therapy 11/02/2023   Leukopenia due to antineoplastic chemotherapy (HCC) 10/26/2023   Cancer associated pain 10/26/2023   Visual disturbances 10/05/2023   Hiccups 10/05/2023   GERD without esophagitis 10/05/2023   Port-A-Cath in place 10/04/2023   Bleeding hemorrhoid 09/29/2023   Familial adenomatous polyposis 09/15/2023   Malignant neoplasm of tonsillar fossa (HCC) 09/14/2023   Submandibular swelling 06/16/2023   Rotator cuff impingement syndrome, left 07/02/2022   Insomnia 04/01/2022   Vasovagal syncope 02/11/2022   Cervical spondylosis 11/13/2021   Healthcare maintenance 09/04/2021   Anxiety and depression 07/24/2021   Other spondylosis with radiculopathy, lumbar region 07/24/2021    Adenomatous polyp 04/04/2015    PCP: Joseph Berkshire, MD  REFERRING PROVIDER: [name], MD  REFERRING DIAG: C09.0 (ICD-10-CM) - Malignant neoplasm of tonsillar fossa (HCC)  THERAPY DIAG:  Abnormal posture - Plan: PT plan of care cert/re-cert  Malignant neoplasm of tonsillar fossa (HCC) - Plan: PT plan of care cert/re-cert  Rationale for Evaluation and Treatment: Rehabilitation  ONSET DATE: 08/26/23  SUBJECTIVE:     SUBJECTIVE STATEMENT: Patient reports they are here today to be seen by their medical team for newly diagnosed cancer of tonsillar fossa.    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 He presented to his PCP July 2024 and was incidentally noted to have left submandibular swelling on routine examination. 07/07/23 neck US which showed an ovoid mass measuring 2.8 x 1.9 cm, most consistent with an abnormally enlarged lymph node, and correlating with the palpable area in the left submandibular region. 07/20/23 CT neck completed which demonstrated: a  2.5 x 3 cm mass in the left tonsillar region with extension towards the tongue base, suspicious for carcinoma, and evidence of level 2 lymphadenopathy on the left, consisting of a dominant node measuring 3 x 2 x 1.7 cm. A benign appearing osteoma was also demonstrated, projecting inferior from the angle of the left mandible. He was referred to Dr. Roma Schanz at [facility] and underwent  biopsies of the left tonsil 08/26/23. Pathology findings revealed findings consistent with HPV associated invasive SCC, p 16 +. 09/07/23 PET at [facility] showed intense radiotracer uptake within the left palatine tonsillar mass; adjacent radiotracer uptake within the region of the right palatine tonsil; and hypermetabolic nodes in the left neck extending to the supraclavicular region concerning for nodal metastases. Focal uptake at the anorectal junction was also demonstrated,  possibly reflecting muscular contraction, however local malignancy can not be excluded. PET otherwise showed no evidence of more distant metastatic disease. Consult with Dr. Basilio Cairo 10/16 & Consult with Dr. [name] 10/17. Radiation/chemotherapy planned.Treatment plan:  He will receive 35 fractions of radiation to his left tonsil and bilateral neck which started on 10/29 and will complete 12/18.  PATIENT GOALS:   to be educated about the signs and symptoms of lymphedema and learn post op HEP.   PAIN:  Are you having pain? Yes: NPRS scale: 5/10 Pain location: throat Pain description: scratching, burning pain, shooting Aggravating factors: drinking  Relieving factors: pain medication  PRECAUTIONS: Active CA  RED FLAGS: None   WEIGHT BEARING RESTRICTIONS: No  FALLS:  Has patient fallen in last 6 months? No Does the patient have a fear of falling that limits activity? 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  COGNITION: Overall cognitive status: Within functional limits for tasks assessed                  POSTURE:  Forward head and rounded shoulders posture  30 SEC SIT TO STAND: 7 reps in 30 sec without use of UEs which is  Poor for patient's age  SHOULDER AROM:   WFL   CERVICAL AROM:   Percent limited  Flexion WFL  Extension WFL  Right lateral flexion WFL  Left lateral flexion WFL  Right rotation WFL  Left rotation WFL    (Blank rows=not tested)  LYMPHEDEMA ASSESSMENT:    Circumference in cm 11/09/23  4 cm superior to sternal notch around neck 37  6 cm superior to sternal notch  around neck 37.9  8 cm superior to sternal notch around neck 37.5  R lateral nostril from base of nose to medial tragus   L lateral nostril from base of nose to medial tragus   R corner of mouth to where ear lobe meets face   L corner of mouth to where ear lobe meets face         (Blank rows=not tested)   GAIT: Assessed: Yes Assistance needed: Independent Ambulation Distance: 10 feet Assistive Device: none Gait pattern: WFL Ambulation surface: Level  PATIENT EDUCATION:  Education details: Neck ROM, importance of posture when sitting, standing and lying down, deep breathing, walking program and importance of staying active throughout treatment, CURE article on staying active, "Why exercise?" flyer, lymphedema and PT info Person educated: Patient Education method: Explanation, Demonstration, Handout Education comprehension: Patient verbalized understanding and returned demonstration  HOME EXERCISE PROGRAM: Patient was instructed today in a home exercise program today for head and neck range of motion exercises. These included active cervical flexion, active cervical extension, active cervical rotation to each  direction, upper trap stretch, and shoulder retraction. Patient was encouraged to do these 2-3 times a day, holding for 5 sec each and completing for 5 reps. Pt was educated that once this becomes easier then hold the stretches for 30-60 seconds.    ASSESSMENT:  CLINICAL IMPRESSION: Pt arrives to PT with recently diagnosed cancer of tonsillar fossa. He will receive 35 fractions of radiation to his left tonsil and bilateral neck which started on 10/29 and will complete 12/18. Pt's cervical ROM was [facility]. Educated pt about signs and symptoms of lymphedema as well as anatomy and physiology of lymphatic system. Educated pt in importance of staying as active as possible throughout treatment to decrease fatigue as well as head and neck ROM exercises to decrease loss of ROM. Will see pt  after completion of radiation to reassess ROM and assess for lymphedema and to determine therapy needs at that time.  Pt will benefit from skilled therapeutic intervention to improve on the following deficits: Decreased knowledge of precautions and postural dysfunction.   PT treatment/interventions: ADL/self-care home management, pt/family education, therapeutic exercise.   REHAB POTENTIAL: Good  CLINICAL DECISION MAKING: Stable/uncomplicated  EVALUATION COMPLEXITY: Low   GOALS: Goals reviewed with patient? YES  LONG TERM GOALS: (STG=LTG)   Name Target Date  Goal status  1 Patient will be able to verbalize understanding of a home exercise program for cervical range of motion, posture, and walking.   Baseline:  No knowledge 11/09/2023 Achieved at eval  2 Patient will be able to verbalize understanding of proper sitting and standing posture. Baseline:  No knowledge 11/09/2023 Achieved at eval  3 Patient will be able to verbalize understanding of lymphedema risk and availability of treatment for this condition Baseline:  No knowledge 11/09/2023 Achieved at eval  4 Pt will demonstrate a return to full cervical ROM and function post operatively compared to baselines and not demonstrate any signs or symptoms of lymphedema.  Baseline: See objective measurements taken today. 12/07/23 New    PLAN:  PT FREQUENCY/DURATION: EVAL and 1 follow up appointment.   PLAN FOR NEXT SESSION: will reassess 2 weeks after completion of radiation to determine needs.  Patient will follow up at outpatient cancer rehab 2 weeks after completion of radiation.  If the patient requires physical therapy at that time, a specific plan will be dictated and sent to the referring physician for approval. The patient was educated today on appropriate basic range of motion exercises to begin now and continue throughout radiation and educated on the signs and symptoms of lymphedema. Patient verbalized good understanding.      Physical Therapy Information for During and After Head/Neck Cancer Treatment: Lymphedema is a swelling condition that you may be at risk for in your neck and/or face if you have radiation treatment to the area and/or if you have surgery that includes removing lymph nodes.  There is treatment available for this condition and it is not life-threatening.  Contact your physician or physical therapist with concerns. An excellent resource for those seeking information on lymphedema is the National Lymphedema Network's website.  It can be accessed at www.lymphnet.org If you notice swelling in your neck or face at any time following surgery (even if it is many years from now), please contact your doctor or physical therapist to discuss this.  Lymphedema can be treated at any time but it is easier for you if it is treated early on. If you have had surgery to your neck, please check with your surgeon  about how soon to start doing neck range of motion exercises.  If you are not having surgery, I encourage you to start doing neck range of motion exercises today and continue these while undergoing treatment, UNLESS you have irritation of your skin or soft tissue that is aggravated by doing them.  These exercises are intended to help you prevent loss of range of motion and/or to gain range of motion in your neck (which can be limited by tightening effects of radiation), and NOT to aggravate these tissues if they develop sensitivities from treatment. Neck range of motion exercises should be done to the point of feeling a GENTLE, TOLERABLE stretch only.  You are encouraged to start a walking or other exercise program tomorrow and continue this as much as you are able through and after treatment.  Please feel free to call me with any questions. Leonette Most, PT, CLT Physical Therapist and Certified Lymphedema Therapist [facility] [address], [place], [place] [address] [phone] [email or web address]  WALKING  Walking is a great form of exercise to increase your strength, endurance and overall fitness.  A walking program can help you start slowly and gradually build endurance as you go.  Everyone's ability is different, so each person's starting point will be different.  You do not have to follow them exactly.  The are just samples. You should simply find out what's right for you and stick to that program.   In the beginning, you'll start off walking 2-3 times a day for short distances.  As you get stronger, you'll be walking further at just 1-2 times per day.  A. You Can Walk For A Certain Length Of Time Each Day    Walk 5 minutes 3 times per day.  Increase 2 minutes every 2 days (3 times per day).  Work up to 25-30 minutes (1-2 times per day).   Example:   Day 1-2 5 minutes 3 times per day   Day 7-8 12 minutes 2-3 times per day   Day 13-14 25 minutes 1-2 times per day  B. You Can Walk For a Certain Distance Each Day     Distance can be substituted for time.    Example:   3 trips to mailbox (at road)   3 trips to corner of block   3 trips around the block  C. Go to local high school and use the track.    Walk for distance ____ around track  Or time ____ minutes  D. Walk ____ Jog ____ Run ___   Why exercise?  So many benefits! Here are SOME of them: Heart health, including raising your good cholesterol level and reducing heart rate and blood pressure Lung health, including improved lung capacity It burns fats, and most of [name] can stand to be leaner, whether or not we are overweight. It increases the body's natural painkillers and mood elevators, so makes you feel better. Not only makes you feel better, but look better too Improves sleep Takes a bite out of stress May decrease your risk of many types of cancer If you are currently undergoing cancer treatment, exercise may improve your ability to tolerate treatments  including chemotherapy. For everybody, it can improve your energy level. Those with cancer-related fatigue report a 40-50% reduction in this symptom when exercising regularly. If you are a survivor of breast, colon, or prostate cancer, it may decrease your risk of a recurrence. (This may hold for other cancers too, but  so far we have data just for these three types.)  How to exercise: Get your doctor's okay. Pick something you enjoy doing, like walking, Zumba, biking, swimming, or whatever. Start at low intensity and time, then gradually increase.  (See walking program handout.) Set a goal to achieve over time.  The American Cancer Society, American Heart Association, and U.S. Dept. of Health and Human Services recommend 150 minutes of moderate exercise, 75 minutes of vigorous exercise, or a combination of both per week. This should be done in episodes at least 10 minutes long, spread throughout the week.  Need help being motivated? Pick something you enjoy doing, because you'll be more inclined to stick with that activity than something that feels like a chore. Do it with a friend so that you are accountable to each other. Schedule it into your day. Place it on your calendar and keep that appointment just like you do any appointment that you make. Join an exercise group that meets at a specific time.  That way, you have to show up on time, and that makes it harder to procrastinate about doing your workout.  It also keeps you accountable--people begin to expect you to be there. Join a gym where you feel comfortable and not intimidated, at the right cost. Sign up for something that you'll need to be in shape for on a specific date, like a 1K or a 5K to walk or run, a 20 or 30 mile bike ride, a mud run or something like that. If the date is looming, you know you'll need to train to be ready for it.  An added benefit is that many of these are fundraisers for good causes. If you've already paid for a  gym membership, group exercise class or event, you might as well work out, so you haven't wasted your money!    Milagros Loll [place], PT 11/09/2023, 12:48 PM

## 2023-11-09 NOTE — Assessment & Plan Note (Signed)
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   - Previously discussed the benefits and risks of radiation alone vs combination of chemotherapy and radiation. Given the patient's age and the size of the lymph node which is borderline at 3 cm, the benefits of adding chemotherapy slightly outweigh the risks. After discussing side effect profile, patient did opt for weekly cisplatin.  Plan made to proceed with weekly cisplatin at 40 mg/m dose during the course of radiation with appropriate pre and post hydration and IV nausea medications.  -He started radiation treatments from 09/27/2023.  Started first dose of cisplatin on 09/29/2023.  -On the same day that he received first dose of cisplatin, he developed visual changes, described as a "3D" effect and double vision. Although very rare, optic neuritis has been reported with cisplatin use on literature review, especially when used in combination with other chemotherapeutic agents.  Though it is rare to happen with just 1 dose of cisplatin, given his symptoms, an ophthalmology referral was placed and we held Cisplatin on 10/05/23. Dr. Vanessa Barbara with ophthalmology evaluated patient on 10/06/2023.  No signs of optic neuritis. Patient was cleared to resume cisplatin.  - We resumed cisplatin from 10/14/2023 and continued this weekly during the course of radiation.  -He received total of 4 doses of cisplatin so far.  -He was hospitalized on 11/01/2023 for neutropenic fever, pain from mucositis.  ANC slowly improved.  Infectious workup was negative.  He was discharged home on 11/05/2023.  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  He is relying on his feeding tube mostly.  -Given issues with neutropenia and the fact that his mucositis is getting worse with chemotherapy, patient would like to take a break from chemo this week.  He is scheduled to  complete radiation treatments on 11/16/2023.  Hence we will defer further chemo at this time.  He was advised to continue radiation treatments as planned.  -We will arrange for labs tomorrow and again as needed.  I will plan to see him approximately 4 weeks after he completes radiation treatments for routine follow-up.

## 2023-11-10 ENCOUNTER — Other Ambulatory Visit: Payer: BC Managed Care – PPO

## 2023-11-10 ENCOUNTER — Ambulatory Visit
Admission: RE | Admit: 2023-11-10 | Discharge: 2023-11-10 | Disposition: A | Discharge status: Home / Self Care | Payer: BC Managed Care – PPO | Source: Ambulatory Visit | Attending: Radiation Oncology | Admitting: Radiation Oncology

## 2023-11-10 ENCOUNTER — Telehealth: Payer: Self-pay | Admitting: Oncology

## 2023-11-10 ENCOUNTER — Inpatient Hospital Stay: Payer: BC Managed Care – PPO

## 2023-11-10 ENCOUNTER — Other Ambulatory Visit: Payer: Self-pay

## 2023-11-10 ENCOUNTER — Inpatient Hospital Stay: Payer: BC Managed Care – PPO | Admitting: Dietician

## 2023-11-10 DIAGNOSIS — C090 Malignant neoplasm of tonsillar fossa: Secondary | ICD-10-CM

## 2023-11-10 LAB — COMPREHENSIVE METABOLIC PANEL
ALT: 36 U/L (ref 0–44)
AST: 19 U/L (ref 15–41)
Albumin: 4.1 g/dL (ref 3.5–5.0)
Alkaline Phosphatase: 106 U/L (ref 38–126)
Anion gap: 6 (ref 5–15)
BUN: 16 mg/dL (ref 6–20)
CO2: 32 mmol/L (ref 22–32)
Calcium: 10.1 mg/dL (ref 8.9–10.3)
Chloride: 99 mmol/L (ref 98–111)
Creatinine, Ser: 1 mg/dL (ref 0.61–1.24)
GFR, Estimated: >60 mL/min (ref >60)
Glucose, Bld: 171 mg/dL — ABNORMAL HIGH (ref 70–99)
Potassium: 3.8 mmol/L (ref 3.5–5.1)
Sodium: 137 mmol/L (ref 135–145)
Total Bilirubin: 0.4 mg/dL (ref <1.2)
Total Protein: 7.7 g/dL (ref 6.5–8.1)

## 2023-11-10 LAB — RAD ONC ARIA SESSION SUMMARY
Course Elapsed Days: 44
Plan Fractions Treated to Date: 31
Plan Prescribed Dose Per Fraction: 2 Gy
Plan Total Fractions Prescribed: 35
Plan Total Prescribed Dose: 70 Gy
Reference Point Dosage Given to Date: 62 Gy
Reference Point Session Dosage Given: 2 Gy
Session Number: 31

## 2023-11-10 LAB — CBC WITH DIFFERENTIAL/PLATELET
Abs Immature Granulocytes: 0.02 10*3/uL (ref 0.00–0.07)
Basophils Absolute: 0 10*3/uL (ref 0.0–0.1)
Basophils Relative: 0 %
Eosinophils Absolute: 0 10*3/uL (ref 0.0–0.5)
Eosinophils Relative: 0 %
HCT: 38 % — ABNORMAL LOW (ref 39.0–52.0)
Hemoglobin: 13.6 g/dL (ref 13.0–17.0)
Immature Granulocytes: 1 %
Lymphocytes Relative: 9 %
Lymphs Abs: 0.3 10*3/uL — ABNORMAL LOW (ref 0.7–4.0)
MCH: 31.9 pg (ref 26.0–34.0)
MCHC: 35.8 g/dL (ref 30.0–36.0)
MCV: 89 fL (ref 80.0–100.0)
Monocytes Absolute: 0.6 10*3/uL (ref 0.1–1.0)
Monocytes Relative: 19 %
Neutro Abs: 2.4 10*3/uL (ref 1.7–7.7)
Neutrophils Relative %: 71 %
Platelets: 402 10*3/uL — ABNORMAL HIGH (ref 150–400)
RBC: 4.27 MIL/uL (ref 4.22–5.81)
RDW: 13 % (ref 11.5–15.5)
WBC: 3.3 10*3/uL — ABNORMAL LOW (ref 4.0–10.5)
nRBC: 0 % (ref 0.0–0.2)

## 2023-11-10 LAB — MAGNESIUM: Magnesium: 2 mg/dL (ref 1.7–2.4)

## 2023-11-10 NOTE — Telephone Encounter (Signed)
Called patient and spoke with Wife to schedule appointments. Patient was unaware of Nut appt. Patient request to cancel appt and will call back to rescheduled Nut appt.  Wife scheduled labs today at 315. Patient confirmed appts.

## 2023-11-10 NOTE — Telephone Encounter (Signed)
Called and spoke with patient per chat with Dr. [name] scheduled labs for the afternoon for patient.  Patient was scheduled for f/u appointment on 01/16.Marland Kitchen

## 2023-11-11 ENCOUNTER — Other Ambulatory Visit: Payer: Self-pay

## 2023-11-11 ENCOUNTER — Ambulatory Visit
Admission: RE | Admit: 2023-11-11 | Discharge: 2023-11-11 | Disposition: A | Discharge status: Home / Self Care | Payer: BC Managed Care – PPO | Source: Ambulatory Visit | Attending: Radiation Oncology

## 2023-11-11 DIAGNOSIS — C090 Malignant neoplasm of tonsillar fossa: Secondary | ICD-10-CM | POA: Diagnosis not present

## 2023-11-11 LAB — RAD ONC ARIA SESSION SUMMARY
Course Elapsed Days: 45
Plan Fractions Treated to Date: 32
Plan Prescribed Dose Per Fraction: 2 Gy
Plan Total Fractions Prescribed: 35
Plan Total Prescribed Dose: 70 Gy
Reference Point Dosage Given to Date: 64 Gy
Reference Point Session Dosage Given: 2 Gy
Session Number: 32

## 2023-11-14 ENCOUNTER — Ambulatory Visit
Admission: RE | Admit: 2023-11-14 | Discharge: 2023-11-14 | Disposition: A | Discharge status: Home / Self Care | Payer: BC Managed Care – PPO | Source: Ambulatory Visit | Attending: Radiation Oncology | Admitting: Radiation Oncology

## 2023-11-14 ENCOUNTER — Other Ambulatory Visit: Payer: Self-pay

## 2023-11-14 ENCOUNTER — Ambulatory Visit: Payer: BC Managed Care – PPO

## 2023-11-14 DIAGNOSIS — C090 Malignant neoplasm of tonsillar fossa: Secondary | ICD-10-CM | POA: Diagnosis not present

## 2023-11-14 LAB — RAD ONC ARIA SESSION SUMMARY
Course Elapsed Days: 48
Plan Fractions Treated to Date: 33
Plan Prescribed Dose Per Fraction: 2 Gy
Plan Total Fractions Prescribed: 35
Plan Total Prescribed Dose: 70 Gy
Reference Point Dosage Given to Date: 66 Gy
Reference Point Session Dosage Given: 2 Gy
Session Number: 33

## 2023-11-15 ENCOUNTER — Ambulatory Visit
Admission: RE | Admit: 2023-11-15 | Discharge: 2023-11-15 | Disposition: A | Discharge status: Home / Self Care | Payer: BC Managed Care – PPO | Source: Ambulatory Visit | Attending: Radiation Oncology

## 2023-11-15 ENCOUNTER — Ambulatory Visit: Payer: BC Managed Care – PPO

## 2023-11-15 ENCOUNTER — Other Ambulatory Visit: Payer: Self-pay

## 2023-11-15 ENCOUNTER — Encounter (HOSPITAL_COMMUNITY): Payer: Self-pay | Admitting: Licensed Clinical Social Worker

## 2023-11-15 DIAGNOSIS — C090 Malignant neoplasm of tonsillar fossa: Secondary | ICD-10-CM | POA: Diagnosis not present

## 2023-11-15 LAB — RAD ONC ARIA SESSION SUMMARY
Course Elapsed Days: 49
Plan Fractions Treated to Date: 34
Plan Prescribed Dose Per Fraction: 2 Gy
Plan Total Fractions Prescribed: 35
Plan Total Prescribed Dose: 70 Gy
Reference Point Dosage Given to Date: 68 Gy
Reference Point Session Dosage Given: 2 Gy
Session Number: 34

## 2023-11-16 ENCOUNTER — Ambulatory Visit
Admission: RE | Admit: 2023-11-16 | Discharge: 2023-11-16 | Disposition: A | Discharge status: Home / Self Care | Payer: BC Managed Care – PPO | Source: Ambulatory Visit | Attending: Radiation Oncology | Admitting: Radiation Oncology

## 2023-11-16 ENCOUNTER — Ambulatory Visit: Payer: BC Managed Care – PPO

## 2023-11-16 ENCOUNTER — Other Ambulatory Visit: Payer: Self-pay

## 2023-11-16 DIAGNOSIS — C090 Malignant neoplasm of tonsillar fossa: Secondary | ICD-10-CM | POA: Diagnosis not present

## 2023-11-16 LAB — RAD ONC ARIA SESSION SUMMARY
Course Elapsed Days: 50
Plan Fractions Treated to Date: 35
Plan Prescribed Dose Per Fraction: 2 Gy
Plan Total Fractions Prescribed: 35
Plan Total Prescribed Dose: 70 Gy
Reference Point Dosage Given to Date: 70 Gy
Reference Point Session Dosage Given: 2 Gy
Session Number: 35

## 2023-11-17 ENCOUNTER — Telehealth: Payer: Self-pay

## 2023-11-17 NOTE — Radiation Completion Notes (Signed)
Patient Name: CORDARO, RENSBERGER MRN: [id number] Date of Birth: 05/20/1981 Referring Physician: Rodney Langton, M.D. Date of Service: 2023-11-17 Radiation Oncologist: [name], M.D. [facility]                             RADIATION ONCOLOGY END OF TREATMENT NOTE     Diagnosis: C09.0 Malignant neoplasm of tonsillar fossa Staging on 2023-09-14: Malignant neoplasm of tonsillar fossa (HCC) T=cT2, N=cN1, M=cM0 Intent: Curative     ==========DELIVERED PLANS==========  First Treatment Date: 2023-09-27 Last Treatment Date: 2023-11-16   Plan Name: HN_L_tonsil Site: Oropharynx Technique: IMRT Mode: Photon Dose Per Fraction: 2 Gy Prescribed Dose (Delivered / Prescribed): 70 Gy / 70 Gy Prescribed Fxs (Delivered / Prescribed): 35 / 35     ==========ON TREATMENT VISIT DATES========== 2023-10-03, 2023-10-10, 2023-10-17, 2023-10-21, 2023-10-25, 2023-10-31, 2023-11-07, 2023-11-14     ==========UPCOMING VISITS========== 01/09/2024 [facility] RETINA EYE EMR RETINA VISIT Rennis Chris, MD  12/15/2023 [facility]  12/15/2023 [facility] EST PT 20 Pasam, [name], MD  12/09/2023 [facility] ONCOLOGY NUT 45 Noreene Larsson, [place]  12/09/2023 [facility]RADIATION ONC FOLLOW UP 20 [name], MD  12/08/2023 [facility] REH AT [address] [place], Lucas L, PT  12/02/2023 [facility] ST TREATMENT Schinke, Karie Georges, CCC-SLP        ==========APPENDIX - ON TREATMENT VISIT NOTES==========   See weekly On Treatment Notes in Epic for details in the Media tab (listed as Progress notes on the On Treatment Visit Dates listed above).

## 2023-11-17 NOTE — Telephone Encounter (Signed)
Attempted to call Patient's Spouse as requested regarding "Forms." Left voicemail for return call.

## 2023-11-17 NOTE — Progress Notes (Signed)
Oncology Nurse Navigator Documentation   Met with Mr. Agerton and his wife after final RT to offer support and to celebrate end of radiation treatment.   Provided verbal post-RT guidance: Importance of keeping all follow-up appts, especially those with Nutrition and SLP. Importance of protecting treatment area from sun. Continuation of Sonafine application 2-3 times daily, application of antibiotic ointment to areas of raw skin; when supply of Sonafine exhausted transition to OTC lotion with vitamin E.  Explained my role as navigator will continue for several more months, encouraged him to call me with needs/concerns.    Hedda Slade RN, BSN, OCN Head & Neck Oncology Nurse Navigator [facility] Phone # [phone]  Fax # [phone]

## 2023-11-18 ENCOUNTER — Other Ambulatory Visit: Payer: Self-pay

## 2023-11-18 ENCOUNTER — Emergency Department (HOSPITAL_BASED_OUTPATIENT_CLINIC_OR_DEPARTMENT_OTHER)
Admission: EM | Admit: 2023-11-18 | Discharge: 2023-11-18 | Disposition: A | Discharge status: Home / Self Care | Payer: BC Managed Care – PPO | Attending: Emergency Medicine | Admitting: Emergency Medicine

## 2023-11-18 ENCOUNTER — Encounter (HOSPITAL_BASED_OUTPATIENT_CLINIC_OR_DEPARTMENT_OTHER): Payer: Self-pay

## 2023-11-18 ENCOUNTER — Emergency Department (HOSPITAL_BASED_OUTPATIENT_CLINIC_OR_DEPARTMENT_OTHER): Payer: BC Managed Care – PPO

## 2023-11-18 ENCOUNTER — Telehealth: Payer: Self-pay

## 2023-11-18 DIAGNOSIS — G893 Neoplasm related pain (acute) (chronic): Secondary | ICD-10-CM

## 2023-11-18 DIAGNOSIS — M542 Cervicalgia: Secondary | ICD-10-CM | POA: Diagnosis present

## 2023-11-18 DIAGNOSIS — E878 Other disorders of electrolyte and fluid balance, not elsewhere classified: Secondary | ICD-10-CM | POA: Diagnosis not present

## 2023-11-18 DIAGNOSIS — R0789 Other chest pain: Secondary | ICD-10-CM | POA: Insufficient documentation

## 2023-11-18 DIAGNOSIS — I82C11 Acute embolism and thrombosis of right internal jugular vein: Secondary | ICD-10-CM | POA: Diagnosis not present

## 2023-11-18 LAB — DIFFERENTIAL
Abs Immature Granulocytes: 0.01 10*3/uL (ref 0.00–0.07)
Basophils Absolute: 0 10*3/uL (ref 0.0–0.1)
Basophils Relative: 0 %
Eosinophils Absolute: 0 10*3/uL (ref 0.0–0.5)
Eosinophils Relative: 0 %
Immature Granulocytes: 0 %
Lymphocytes Relative: 6 %
Lymphs Abs: 0.3 10*3/uL — ABNORMAL LOW (ref 0.7–4.0)
Monocytes Absolute: 1.3 10*3/uL — ABNORMAL HIGH (ref 0.1–1.0)
Monocytes Relative: 30 %
Neutro Abs: 2.9 10*3/uL (ref 1.7–7.7)
Neutrophils Relative %: 64 %

## 2023-11-18 LAB — BASIC METABOLIC PANEL
Anion gap: 7 (ref 5–15)
BUN: 12 mg/dL (ref 6–20)
CO2: 31 mmol/L (ref 22–32)
Calcium: 9.2 mg/dL (ref 8.9–10.3)
Chloride: 97 mmol/L — ABNORMAL LOW (ref 98–111)
Creatinine, Ser: 0.85 mg/dL (ref 0.61–1.24)
GFR, Estimated: >60 mL/min (ref >60)
Glucose, Bld: 112 mg/dL — ABNORMAL HIGH (ref 70–99)
Potassium: 4.1 mmol/L (ref 3.5–5.1)
Sodium: 135 mmol/L (ref 135–145)

## 2023-11-18 LAB — CBC
HCT: 37.4 % — ABNORMAL LOW (ref 39.0–52.0)
HCT: 38.4 % — ABNORMAL LOW (ref 39.0–52.0)
Hemoglobin: 13.1 g/dL (ref 13.0–17.0)
Hemoglobin: 13.4 g/dL (ref 13.0–17.0)
MCH: 31.2 pg (ref 26.0–34.0)
MCH: 31.2 pg (ref 26.0–34.0)
MCHC: 34.9 g/dL (ref 30.0–36.0)
MCHC: 35 g/dL (ref 30.0–36.0)
MCV: 89 fL (ref 80.0–100.0)
MCV: 89.3 fL (ref 80.0–100.0)
Platelets: 290 10*3/uL (ref 150–400)
Platelets: 317 10*3/uL (ref 150–400)
RBC: 4.2 MIL/uL — ABNORMAL LOW (ref 4.22–5.81)
RBC: 4.3 MIL/uL (ref 4.22–5.81)
RDW: 13.3 % (ref 11.5–15.5)
RDW: 13.4 % (ref 11.5–15.5)
WBC: 4.5 10*3/uL (ref 4.0–10.5)
WBC: 4.6 10*3/uL (ref 4.0–10.5)
nRBC: 0 % (ref 0.0–0.2)
nRBC: 0 % (ref 0.0–0.2)

## 2023-11-18 MED ORDER — APIXABAN 2.5 MG PO TABS
10.0000 mg | ORAL_TABLET | Freq: Two times a day (BID) | ORAL | Status: DC
  Filled 2023-11-18: qty 4

## 2023-11-18 MED ORDER — KETOROLAC TROMETHAMINE 15 MG/ML IJ SOLN
15.0000 mg | Freq: Once | INTRAMUSCULAR | Status: AC
Start: 2023-11-18 — End: 2023-11-18
  Administered 2023-11-18: 15 mg via INTRAVENOUS
  Filled 2023-11-18: qty 1

## 2023-11-18 MED ORDER — ENOXAPARIN SODIUM 40 MG/0.4ML IJ SOSY
80.0000 mg | PREFILLED_SYRINGE | Freq: Two times a day (BID) | INTRAMUSCULAR | 0 refills | Status: DC
  Filled 2023-11-18: qty 8, 5d supply, fill #0
  Filled 2023-11-26: qty 8, 5d supply, fill #1

## 2023-11-18 MED ORDER — IOHEXOL 300 MG/ML  SOLN
75.0000 mL | Freq: Once | INTRAMUSCULAR | Status: AC | PRN
  Administered 2023-11-18: 75 mL via INTRAVENOUS

## 2023-11-18 MED ORDER — ENOXAPARIN SODIUM 80 MG/0.8ML IJ SOSY
80.0000 mg | PREFILLED_SYRINGE | Freq: Two times a day (BID) | INTRAMUSCULAR | Status: DC
  Administered 2023-11-18: 80 mg via SUBCUTANEOUS
  Filled 2023-11-18: qty 0.8

## 2023-11-18 MED ORDER — APIXABAN 2.5 MG PO TABS: 5.0000 mg | ORAL_TABLET | Freq: Two times a day (BID) | ORAL | Status: DC

## 2023-11-18 NOTE — Telephone Encounter (Signed)
Patient's wife left a VM stating that patient was experiencing right sided neck pain, stating it is particularly intense around his port-a-cath. Relayed symptoms to [facility] provider and patient's medical oncologist, and was advised to direct patient to ED to rule out potential clot, infection, or other adverse causes.   Returned wife's call and spoke with patient directly. He stated he's been experiencing this pain for the past 2 almost 3 days, and it worsens with any type of head/neck movement. He described the pain as aching and reports it's localized behind his ear, collar bone, and along the the side of his neck with the catheter. Patient denied any fever, chills, or redness/swelling to areas. Informed patient and spouse of provider's recommendations to proceed to ED for evaluation. Patient and spouse verbalized understanding and agreement, and appreciation of return call.

## 2023-11-18 NOTE — ED Provider Notes (Signed)
Signout from [employer] at shift change. Briefly, patient presents for right sided neck pain.  Port-A-Cath placed October 2024.  He has had increasing pain in the right neck.  CT imaging concerning for thrombophlebitis/filling defects.  I spoke with on-call IR, Dr. Archer Asa, recommends right upper extremity venous ultrasound to evaluate for and confirm DVT.  If positive, would advise starting Lovenox.  Patient would need admitted if concern for infection existed, however felt that would be unlikely given normal white blood cell count induration that the Port-A-Cath has been in place.  If he can go home, will need close follow-up with his oncologist next week.   Plan: Upper extremity ultrasound ordered  10:37 PM Reassessment performed. Patient appears stable on several rechecks.  In the interim I have also spoken with Dr. Leonides Schanz of hematology oncology.  He will make patient's oncologist aware.  He agrees with treatment plan/anticoagulation.  Labs and imaging personally reviewed and interpreted including: CBC with normal white blood cell count, normal hemoglobin; BMP unremarkable; CT soft tissue neck and CT chest shows findings consistent with acute right internal jugular vein thrombosis.   Most current vital signs reviewed and are as follows: BP 126/80   Pulse 99   Temp 98.8 F (37.1 C) (Oral)   Resp 18   Ht 5\' 8"  (1.727 m)   Wt 76.7 kg   SpO2 93%   BMI 25.70 kg/m   Plan: Discharge to home   Home treatment: Lovenox as patient has feeding tube and is unable to take orals.   Return and follow-up instructions: Encouraged return to ED with worsening pain, shortness of breath, new or worsening symptoms.. Encouraged patient to follow-up with their provider in 3 days. Patient verbalized understanding and agreed with plan.        Renne Crigler, PA-C 11/18/23 2238    Rolan Bucco, MD 11/18/23 [phone]

## 2023-11-18 NOTE — ED Notes (Signed)
Patient transported to CT 

## 2023-11-18 NOTE — ED Triage Notes (Signed)
Just finished chemo therapy several weeks.  Developed right side neck pain two days ago.  Pain starts at collar bone and radiates up neck to behind ear.

## 2023-11-18 NOTE — ED Notes (Signed)
 RN reviewed discharge instructions with pt. Pt verbalized understanding and had no further questions. VSS upon discharge.  

## 2023-11-18 NOTE — Discharge Instructions (Signed)
Please read and follow all provided instructions.  Your diagnoses today include:  1. Internal jugular vein thrombosis, right (HCC)   2. Cancer associated pain    Tests performed today include: CT scan of your neck and chest showed clot in the jugular vein on the right side Vital signs. See below for your results today.   Medications prescribed:  Enoxaparin: Blood thinner medication  Take any prescribed medications only as directed.  Home care instructions:  Follow any educational materials contained in this packet.  BE VERY CAREFUL not to take multiple medicines containing Tylenol (also called acetaminophen). Doing so can lead to an overdose which can damage your liver and cause liver failure and possibly death.   Follow-up instructions: Please follow-up with your oncologist early next week.  They are aware that you were here.  Please contact them on Monday for an appointment.  Return instructions:  Please return to the Emergency Department if you experience worsening symptoms.  Please return if you have any other emergent concerns.  Additional Information:  Your vital signs today were: BP 100/71   Pulse 95   Temp 98.7 F (37.1 C) (Oral)   Resp 17   Ht 5\' 8"  (1.727 m)   Wt 76.7 kg   SpO2 96%   BMI 25.70 kg/m  If your blood pressure (BP) was elevated above 135/85 this visit, please have this repeated by your doctor within one month. --------------

## 2023-11-18 NOTE — ED Provider Notes (Signed)
[place] EMERGENCY DEPARTMENT AT [facility] Provider Note   CSN: [id number] Arrival date & time: 11/18/23  1631     History {Add pertinent medical, surgical, social history, OB history to HPI:1} Chief Complaint  Patient presents with  . Neck Pain    Harry Williams is a 42 y.o. male.   Neck Pain   42 year old male presents emergency department complaints of right-sided neck pain.  Patient reports pain present for the last 2 days or so.  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  States that he is having pain directly over where his Port-A-Cath has been inserted with some pain extending up to the right side of his neck.  Reports pain worsened with any movement of his neck.  Denies any known fever, chills, nausea, vomiting, chest pain, shortness of breath.  Area has not been accessed since 12/7 per patient.  Past medical history significant for cancer, malignant neoplasm of tonsillar fossa, vasovagal syncope  Home Medications Prior to Admission medications   Medication Sig Start Date End Date Taking? Authorizing Provider  baclofen (LIORESAL) 10 MG tablet Take 1 tablet (10 mg total) by mouth 3 (three) times daily. Patient not taking: Reported on 11/01/2023 10/04/23   Shanon Ace, PA-C  celecoxib (CELEBREX) 200 MG capsule Place 200 mg into feeding tube daily as needed for moderate pain (pain score 4-6).    [provider]  Cholecalciferol (VITAMIN D3) 1000 units CAPS Place 1,000 Units into feeding tube daily.    [provider]  dexamethasone (DECADRON) 4 MG tablet Place 8 mg into feeding tube See admin instructions. 8 mg, per tube, once a day for three days- starting the day after Cisplatin chemotherapy    [provider]  fentaNYL (DURAGESIC) 50 MCG/HR Place 1 patch onto the skin every 3 (three) days. 11/03/23   Pasam, [name], MD   HYDROcodone-acetaminophen (HYCET) 7.5-325 mg/15 ml solution Take 10 mLs by mouth every 4 (four) hours as needed for moderate pain (pain score 4-6). Patient not taking: Reported on 11/01/2023 10/31/23   Pasam, [name], MD  lidocaine (XYLOCAINE) 2 % solution Patient: Mix 1part 2% viscous lidocaine, 1part H20. Swish & swallow 10mL of diluted mixture, before meals and at bedtime, up to QID Patient taking differently: 10 mLs See admin instructions. Mix 1 part 2% viscous lidocaine with 1 part water. Swish & swallow 10 ml's of diluted mixture up to 4 times a day. 10/10/23   [name], MD  lidocaine-prilocaine (EMLA) cream Apply to affected area once Patient taking differently: Apply 1 Application topically once. 09/26/23   Pasam, [name], MD  Nutritional Supplements (FEEDING SUPPLEMENT, OSMOLITE 1.5 CAL,) LIQD 7 cartons Osmolite 1.5 split over four feeding daily. Flush tube with 60 ml water before and after each bolus. Provide additional 3 1/2 cups (830 ml) water/day to meet hydration needs. Provides 2485 kcal, 104.3 g protein, 1267 ml free water from formula (2577 ml total water with flushes). 1659 ml/day meets 100% RDI 10/18/23   [name], MD  omeprazole (PRILOSEC) 20 MG capsule 20 mg See admin instructions. 20 mg per tube once a day    [provider]  ondansetron (ZOFRAN) 8 MG tablet Take 1 tablet (8 mg total) by mouth every 8 (eight) hours as needed for nausea or vomiting. Start on the third day after cisplatin. Patient taking differently: Place 8 mg into feeding tube every 8 (eight) hours as needed  for nausea or vomiting. Start on the third day after cisplatin. 09/26/23   Pasam, [name], MD  PARoxetine (PAXIL CR) 12.5 MG 24 hr tablet Take 1 tablet (12.5 mg total) by mouth daily. Patient not taking: Reported on 11/01/2023 08/30/23   Thresa Ross, MD  prochlorperazine (COMPAZINE) 10 MG tablet Take 1 tablet (10 mg total) by mouth every 6 (six) hours as needed (Nausea or  vomiting). Patient taking differently: Place 10 mg into feeding tube every 6 (six) hours as needed for nausea or vomiting. 09/26/23   Pasam, [name], MD  scopolamine (TRANSDERM-SCOP) 1 MG/3DAYS Place 1 patch (1.5 mg total) onto the skin every 3 (three) days. 10/31/23   [name], MD      Allergies    Patient has no known allergies.    Review of Systems   Review of Systems  Musculoskeletal:  Positive for neck pain.  All other systems reviewed and are negative.   Physical Exam Updated Vital Signs BP (!) 144/85 (BP Location: Right Arm)   Pulse 99   Temp 98.8 F (37.1 C) (Oral)   Resp 16   Ht 5\' 8"  (1.727 m)   Wt 76.7 kg   SpO2 95%   BMI 25.70 kg/m  Physical Exam Vitals and nursing note reviewed.  Constitutional:      General: He is not in acute distress.    Appearance: He is well-developed.  HENT:     Head: Normocephalic and atraumatic.  Eyes:     Conjunctiva/sclera: Conjunctivae normal.  Neck:     Comments: No midline tenderness of cervical spine with no tenderness to palpation paraspinal region bilaterally.  Right-sided Port-A-Cath placed with tenderness along over the clavicle anterior neck tenderness on the right side.  No obvious fluctuance/induration.  No obvious erythema or palpable warmth compared to surrounding skin. Cardiovascular:     Rate and Rhythm: Normal rate and regular rhythm.     Heart sounds: No murmur heard. Pulmonary:     Effort: Pulmonary effort is normal. No respiratory distress.     Breath sounds: Normal breath sounds.  Abdominal:     Palpations: Abdomen is soft.     Tenderness: There is no abdominal tenderness.  Musculoskeletal:        General: No swelling.     Cervical back: Neck supple.  Skin:    General: Skin is warm and dry.     Capillary Refill: Capillary refill takes less than 2 seconds.  Neurological:     Mental Status: He is alert.  Psychiatric:        Mood and Affect: Mood normal.    ED Results / Procedures / Treatments    Labs (all labs ordered are listed, but only abnormal results are displayed) Labs Reviewed  CBC - Abnormal; Notable for the following components:      Result Value   RBC 4.20 (*)    HCT 37.4 (*)    All other components within normal limits  BASIC METABOLIC PANEL - Abnormal; Notable for the following components:   Chloride 97 (*)    Glucose, Bld 112 (*)    All other components within normal limits  DIFFERENTIAL - Abnormal; Notable for the following components:   Lymphs Abs 0.3 (*)    Monocytes Absolute 1.3 (*)    All other components within normal limits  CBC - Abnormal; Notable for the following components:   HCT 38.4 (*)    All other components within normal limits  CULTURE, BLOOD (ROUTINE X 2)  CULTURE, BLOOD (ROUTINE X 2)    EKG None  Radiology No results found.  Procedures Procedures  {Document cardiac monitor, telemetry assessment procedure when appropriate:1}  Medications Ordered in ED Medications  iohexol (OMNIPAQUE) 300 MG/ML solution 75 mL (has no administration in time range)  iohexol (OMNIPAQUE) 300 MG/ML solution 75 mL (75 mLs Intravenous Contrast Given 11/18/23 1758)    ED Course/ Medical Decision Making/ A&P   {   Click here for ABCD2, HEART and other calculatorsREFRESH Note before signing :1}                              Medical Decision Making Amount and/or Complexity of Data Reviewed Labs: ordered. Radiology: ordered.  Risk Prescription drug management.   This patient presents to the ED for concern of neck pain, this involves an extensive number of treatment options, and is a complaint that carries with it a high risk of complications and morbidity.  The differential diagnosis includes cellulitis, abscess, necrotizing infection, sepsis, other   Co morbidities that complicate the patient evaluation  See HPI   Additional history obtained:  Additional history obtained from EMR External records from outside source obtained and reviewed  including hospital records   Lab Tests:  I Ordered, and personally interpreted labs.  The pertinent results include: No leukocytosis.  No evidence of anemia.  Platelets within normal range.  Hypochloremia of 97 but otherwise electrolytes within normal limits.  No renal dysfunction.  Blood cultures pending.   Imaging Studies ordered:  I ordered imaging studies including CT soft tissue neck, CT chest I independently visualized and interpreted imaging which showed  CT soft tissue neck:*** CT chest:*** I agree with the radiologist interpretation   Cardiac Monitoring: / EKG:  The patient was maintained on a cardiac monitor.  I personally viewed and interpreted the cardiac monitored which showed an underlying rhythm of: Sinus rhythm   Consultations Obtained:  I requested consultation with attending Dr. Fredderick Phenix who is in agreement with treatment plan going forward   Problem List / ED Course / Critical interventions / Medication management  *** Reevaluation of the patient showed that the patient stayed the same I have reviewed the patients home medicines and have made adjustments as needed   Social Determinants of Health:  Denies tobacco, licit drug use.   Test / Admission - Considered:  *** Vitals signs within normal range and stable throughout visit. Laboratory/imaging studies significant for: See above *** Worrisome signs and symptoms were discussed with the patient, and the patient acknowledged understanding to return to the ED if noticed. Patient was stable upon discharge.    {Document critical care time when appropriate:1} {Document review of labs and clinical decision tools ie heart score, Chads2Vasc2 etc:1}  {Document your independent review of radiology images, and any outside records:1} {Document your discussion with family members, caretakers, and with consultants:1} {Document social determinants of health affecting pt's care:1} {Document your decision making why  or why not admission, treatments were needed:1} Final Clinical Impression(s) / ED Diagnoses Final diagnoses:  None    Rx / DC Orders ED Discharge Orders     None

## 2023-11-18 NOTE — Progress Notes (Addendum)
PHARMACY - ANTICOAGULATION CONSULT NOTE  Pharmacy Consult for enoxaparin Indication: DVT (IJ occlusion 2/2 portacath)  No Known Allergies  Patient Measurements: Height: 5\' 8"  (172.7 cm) Weight: 76.7 kg (169 lb) IBW/kg (Calculated) : 68.4 Heparin Dosing Weight: 76.7 kg  Vital Signs: Temp: 98.8 F (37.1 C) (12/20 1646) Temp Source: Oral (12/20 1646) BP: 126/80 (12/20 1900) Pulse Rate: 99 (12/20 1900)  Labs: Recent Labs    11/18/23 1723  HGB 13.4  13.1  HCT 38.4*  37.4*  PLT 317  290  CREATININE 0.85    Estimated Creatinine Clearance: 110.6 mL/min (by C-G formula based on SCr of 0.85 mg/dL).   Medical History: Past Medical History:  Diagnosis Date   Anxiety    Asthma    Avascular necrosis of hip, left (HCC) 07/24/2021   Bright red blood per rectum 12/16/2022   Closed fracture of nasal bone with routine healing 12/24/2016   Depression    PTSD (post-traumatic stress disorder)     Medications:  (Not in a hospital admission)   Assessment: 42 yo M presents to ED for right-sided neck pain that started 2 days ago. CT positive for acute right internal jugular vein thrombosis. Pt has a history of left tonsillar squamous cell carcinoma and has completed XRT on 12/18 and current chemo regimen of cisplatin is on hold during XRT. Pt does not take any oral anticoagulation prior to admission. Patient is unable to swallow pills and so all medicines must be given by alternative means. Pharmacy consulted to dose enoxaparin for acute internal jugular thrombosis.   Hgb 13.1, Plt 290 No s/sx of bleeding  Goal of Therapy:  LMWH (anti-Xa) level: 0.6-1.0 peak level (drawn 4 hours after dose) Monitor platelets by anticoagulation protocol: Yes   Plan:  Start enoxaparin 80mg  [place] q12h Monitor CBC and for s/sx of bleeding   Wilburn Cornelia, PharmD, BCPS Clinical Pharmacist 11/18/2023 9:32 PM   Please refer to [facility] for pharmacy phone number

## 2023-11-19 ENCOUNTER — Other Ambulatory Visit: Payer: Self-pay | Admitting: Oncology

## 2023-11-19 ENCOUNTER — Other Ambulatory Visit (HOSPITAL_BASED_OUTPATIENT_CLINIC_OR_DEPARTMENT_OTHER): Payer: Self-pay

## 2023-11-19 DIAGNOSIS — G893 Neoplasm related pain (acute) (chronic): Secondary | ICD-10-CM

## 2023-11-22 ENCOUNTER — Other Ambulatory Visit (HOSPITAL_COMMUNITY): Payer: Self-pay

## 2023-11-23 ENCOUNTER — Other Ambulatory Visit: Payer: Self-pay | Admitting: Medical Genetics

## 2023-11-23 LAB — CULTURE, BLOOD (ROUTINE X 2)
Culture: NO GROWTH
Culture: NO GROWTH
Special Requests: ADEQUATE
Special Requests: ADEQUATE

## 2023-11-24 ENCOUNTER — Encounter: Payer: Self-pay | Admitting: Oncology

## 2023-11-24 ENCOUNTER — Other Ambulatory Visit (HOSPITAL_COMMUNITY): Payer: Self-pay

## 2023-11-24 MED ORDER — HYDROCODONE-ACETAMINOPHEN 7.5-325 MG/15ML PO SOLN
10.0000 mL | ORAL | 0 refills | Status: DC | PRN
  Filled 2023-11-24: qty 500, 9d supply, fill #0

## 2023-11-25 ENCOUNTER — Other Ambulatory Visit (HOSPITAL_COMMUNITY): Payer: Self-pay

## 2023-11-29 ENCOUNTER — Other Ambulatory Visit: Payer: Self-pay | Admitting: Family Medicine

## 2023-11-29 ENCOUNTER — Other Ambulatory Visit: Payer: Self-pay | Admitting: Oncology

## 2023-11-29 DIAGNOSIS — G893 Neoplasm related pain (acute) (chronic): Secondary | ICD-10-CM

## 2023-11-29 MED ORDER — CELECOXIB 200 MG PO CAPS: 200.0000 mg | ORAL_CAPSULE | Freq: Every day | ORAL | 0 refills | Status: DC | PRN

## 2023-11-29 MED ORDER — FENTANYL 50 MCG/HR TD PT72: 1.0000 | MEDICATED_PATCH | TRANSDERMAL | 0 refills | Status: DC

## 2023-12-02 ENCOUNTER — Ambulatory Visit: Payer: BC Managed Care – PPO | Attending: Radiation Oncology

## 2023-12-02 DIAGNOSIS — R1310 Dysphagia, unspecified: Secondary | ICD-10-CM | POA: Diagnosis present

## 2023-12-02 NOTE — Therapy (Signed)
 OUTPATIENT SPEECH LANGUAGE PATHOLOGY ONCOLOGY EVALUATION   Patient Name: Harry Williams MRN: [id number] DOB:1981/08/22, 43 y.o., male Today's Date: 12/02/2023  PCP: Alvia Mayo, MD REFERRING PROVIDER: [name], MD  END OF SESSION:  End of Session - 12/02/23 0846     Visit Number 2    Number of Visits 7    Date for SLP Re-Evaluation 01/23/24    SLP Start Time 0848    SLP Stop Time  0925    SLP Time Calculation (min) 37 min    Activity Tolerance Patient tolerated treatment well             Past Medical History:  Diagnosis Date   Anxiety    Asthma    Avascular necrosis of hip, left (HCC) 07/24/2021   Bright red blood per rectum 12/16/2022   Closed fracture of nasal bone with routine healing 12/24/2016   Depression    PTSD (post-traumatic stress disorder)    Past Surgical History:  Procedure Laterality Date   APPENDECTOMY     COLON SURGERY  03/30/2015   IR GASTROSTOMY TUBE MOD SED  09/22/2023   IR IMAGING GUIDED PORT INSERTION  09/22/2023   JOINT REPLACEMENT  2008   partial right elbow; right femoral graft   Total abdominal proctocolectomy, J-pouch, ileal pouch anal anastomosis  2016   Patient Active Problem List   Diagnosis Date Noted   Oral mucositis due to radiation therapy 11/02/2023   Leukopenia due to antineoplastic chemotherapy (HCC) 10/26/2023   Cancer associated pain 10/26/2023   Visual disturbances 10/05/2023   Hiccups 10/05/2023   GERD without esophagitis 10/05/2023   Port-A-Cath in place 10/04/2023   Bleeding hemorrhoid 09/29/2023   Familial adenomatous polyposis 09/15/2023   Malignant neoplasm of tonsillar fossa (HCC) 09/14/2023   Submandibular swelling 06/16/2023   Rotator cuff impingement syndrome, left 07/02/2022   Insomnia 04/01/2022   Vasovagal syncope 02/11/2022   Cervical spondylosis 11/13/2021   Healthcare maintenance 09/04/2021   Anxiety and depression 07/24/2021   Other spondylosis with radiculopathy, lumbar region 07/24/2021    Adenomatous polyp 04/04/2015    ONSET DATE: July 2024 (script  10/03/23)  REFERRING DIAG:  C09.0 (ICD-10-CM) - Malignant neoplasm of tonsillar fossa (HCC)    THERAPY DIAG:  Dysphagia, unspecified type  Rationale for Evaluation and Treatment: Rehabilitation  SUBJECTIVE:   SUBJECTIVE STATEMENT: Pt admits he has not completed HEP as directed since initial eval.   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 He presented to his PCP July 2024 and was incidentally noted to have left submandibular swelling on routine examination. 07/07/23 neck US  which showed an ovoid mass measuring 2.8 x 1.9 cm, most consistent with an abnormally enlarged lymph node, and correlating with the palpable area in the left submandibular region. 07/20/23 CT neck completed which demonstrated: a  2.5 x 3 cm mass in the left tonsillar region with extension towards the tongue base, suspicious for carcinoma, and evidence of level 2 lymphadenopathy on the left, consisting of a dominant node measuring 3 x 2 x 1.7 cm. A benign appearing osteoma was also demonstrated, projecting inferior from the angle of the left mandible. He was referred to Dr. Denys at [facility] and underwent biopsies of the left tonsil 08/26/23. Pathology findings revealed findings consistent with HPV associated invasive SCC, p 16 +. 09/07/23 PET at [facility] showed intense radiotracer uptake within the left palatine tonsillar  mass; adjacent radiotracer uptake within the region of the right palatine tonsil; and hypermetabolic nodes in the left neck extending to the supraclavicular region concerning for nodal metastases. Focal uptake at the anorectal junction was also demonstrated, possibly reflecting muscular contraction, however local malignancy can not be excluded. PET otherwise showed no evidence of more distant metastatic disease. Consult with Dr.  Izell 10/16 & Consult with Dr. Autumn 10/17. Radiation/chemotherapy planned.Treatment plan:  He will receive 35 fractions of radiation to his left tonsil and bilateral neck which started on 10/29 and will complete 12/18.  PAIN:  Are you having pain? No  FALLS: Has patient fallen in last 6 months?  No  PATIENT GOALS: Maintain WNL swallowing  OBJECTIVE:  Note: Objective measures were completed at Evaluation unless otherwise noted.   TODAY'S TREATMENT:                                                                                                                                         DATE:   12/02/23: Pt req'd min A usually with HEP today, independent by session end. He req'd max A for rationale for HEP.  He has attempted grapes and water at home, with good pharyngeal clearance. No coughing/wet voice ID 'd during those POs. Today SLP provided pt with applesauce and water without any overt s/sx oral or pharyngeal deficits. SLP told pt he could safely eat these type of POs and encouraged him to cut his last PEG feeding in half to wake up slightly hungry and have grits, wet scrambled eggs/pureed eggs, slurried biscuits and gravy - all with liquid wash, or a carnation instant breakfast/Ensure/Boost PO. SLP encouraged him to discuss matching calorie intake between tube feed and PO with Elvie at next dietician visit on 12/09/23. SLP told pt if he begins to lose weight to keep all tube feeds unchanged from current regimen and just have POs in addition to tube feeds.\ Pt demo'd understanding of details and rationale of a food journal to SLP.  10/25/23 (eval): Research states the risk for dysphagia increases due to radiation and/or chemotherapy treatment due to a variety of factors, so SLP educated the pt about the possibility of reduced/limited ability for PO intake during rad tx. SLP also educated pt regarding possible changes to swallowing musculature after rad tx, and why adherence to dysphagia HEP  provided today and PO consumption was necessary to inhibit muscle fibrosis following rad tx and to mitigate muscle disuse atrophy. SLP informed pt why this would be detrimental to their swallowing status and to their pulmonary health. Pt demonstrated understanding of these things to SLP. SLP encouraged pt to safely eat and drink as deep into their radiation/chemotherapy as possible to provide the best possible long-term swallowing outcome for pt.  SLP then developed an individualized HEP for pt involving oral and pharyngeal strengthening and ROM and pt was instructed how to perform these exercises, including  SLP demonstration. After SLP demonstration, pt return demonstrated each exercise. SLP ensured pt performance was correct prior to educating pt on next exercise. Pt required min cues faded to modified independent to perform HEP. Pt was instructed to complete this program 5-7 days/week, at least 20 reps a day until 6 months after his or her last day of rad tx, and then x2 a week after that, indefinitely. Among other modifications for days when pt cannot functionally swallow, SLP also suggested pt to perform only non-swallowing tasks on the handout/HEP, and if necessary to cycle through the swallowing portion so the full program of exercises can be completed instead of fatiguing on one of the swallowing exercises and being unable to perform the other swallowing exercises. SLP instructed that swallowing exercises should then be added back into the regimen as pt is able to do so. Secondly, pt was told that former patients have told SLP that during their course of radiation therapy, taking prescribed pain medication just prior to performing HEP (and eating/drinking) has proven helpful in completing HEP (and eating and drinking) more regularly when going through their course of radiation treatment.    PATIENT EDUCATION: Education details: late effects head/neck radiation on swallow function and HEP  procedure Person educated: Patient Education method: Explanation, Demonstration, and Verbal cues Education comprehension: verbalized understanding, returned demonstration, verbal cues required, and needs further education   ASSESSMENT:  CLINICAL IMPRESSION: Patient is a 42 y.o. M who was seen today for treatment of swallowing as they undergo radiation/chemoradiation therapy. Today pt ate items from Dys I and drank thin liquids without overt s/s oral or pharyngeal difficulty. At this time pt swallowing is deemed WNL/WFL with these POs. No oral or overt s/sx pharyngeal deficits, including aspiration were observed. There are no overt s/s aspiration PNA observed by SLP nor any reported by pt at this time. Data indicate that pt's swallow ability will likely decrease over the course of radiation/chemoradiation therapy and could very well decline over time following the conclusion of that therapy due to muscle disuse atrophy and/or muscle fibrosis. Pt will cont to need to be seen by SLP in order to assess safety of PO intake, assess the need for recommending any objective swallow assessment, and ensuring pt is correctly completing the individualized HEP.  OBJECTIVE IMPAIRMENTS: include dysphagia. These impairments are limiting patient from safety when swallowing. Factors affecting potential to achieve goals and functional outcome are  none noted today . Patient will benefit from skilled SLP services to address above impairments and improve overall function.  REHAB POTENTIAL: Good   GOALS: Goals reviewed with patient? No SHORT TERM GOALS: Target: 3rd total session   1. Pt will compelte HEP with modified independence in 2 sessions Baseline: Goal status: INITIAL   2.  pt will tell SLP why pt is completing HEP with modified independence Baseline:  Goal status: INITIAL   3.  pt will describe 3 overt s/s aspiration PNA with modified independence Baseline:  Goal status: INITIAL   4.  pt will tell  SLP how a food journal could hasten return to a more normalized diet Baseline:  Goal status: Met     LONG TERM GOALS: Target: 7th total session   1.  pt will complete HEP with independence over two visits Baseline:  Goal status: INITIAL   2.  pt will describe how to modify HEP over time, and the timeline associated with reduction in HEP frequency with modified independence over two sessions Baseline:  Goal status: INITIAL  PLAN:   SLP FREQUENCY:  once approx every 4 weeks   SLP DURATION:  7 sessions   PLANNED INTERVENTIONS: Aspiration precaution training, Pharyngeal strengthening exercises, Diet toleration management , Trials of upgraded texture/liquids, SLP instruction and feedback, Compensatory strategies, and Patient/family education    [facility], CCC-SLP 12/02/2023, 8:47 AM

## 2023-12-08 ENCOUNTER — Telehealth: Payer: Self-pay | Admitting: *Deleted

## 2023-12-08 ENCOUNTER — Ambulatory Visit: Payer: BC Managed Care – PPO | Attending: Radiation Oncology | Admitting: Physical Therapy

## 2023-12-08 DIAGNOSIS — R293 Abnormal posture: Secondary | ICD-10-CM | POA: Insufficient documentation

## 2023-12-08 DIAGNOSIS — C090 Malignant neoplasm of tonsillar fossa: Secondary | ICD-10-CM | POA: Insufficient documentation

## 2023-12-08 NOTE — Telephone Encounter (Signed)
 CALLED PATIENT TO ASK ABOUT RESCHEDULING FU APPT. FOR 12-09-23 DUE TO SNOWY WEATHER, PATIENT AGREED TO COME ON 12-13-23 @ 3:40 PM

## 2023-12-08 NOTE — Telephone Encounter (Signed)
 CALLED PATIENT TO INFORM THAT APPT. WITH NUTRITIONIST HAS BEEN CANCELLED FOR  12/09/23 DUE TO THE WEATHER, APPT. HAS BEEN RESCHEDULED FOR  12-13-23 @ 12 PM VIA TELEPHONE, SPOKE WITH PATIENT AND HE IS AWARE OF THIS APPT. AND IS GOOD WITH THIS

## 2023-12-09 ENCOUNTER — Ambulatory Visit: Payer: BC Managed Care – PPO | Admitting: Radiation Oncology

## 2023-12-09 ENCOUNTER — Inpatient Hospital Stay: Payer: BC Managed Care – PPO | Admitting: Dietician

## 2023-12-12 ENCOUNTER — Other Ambulatory Visit: Payer: Self-pay | Admitting: Oncology

## 2023-12-12 DIAGNOSIS — G893 Neoplasm related pain (acute) (chronic): Secondary | ICD-10-CM

## 2023-12-12 DIAGNOSIS — C090 Malignant neoplasm of tonsillar fossa: Secondary | ICD-10-CM

## 2023-12-12 NOTE — Progress Notes (Addendum)
 Mr. Polyak presents today for follow-up after completing radiation to his left tonsil on 11/16/2023  Pain issues, if any: Denies Using a feeding tube?: Patient is able to take in some food and fluids. Patient is using a feeding tube. Weight changes, if any: Patient has lost 30 pounds within three months. Swallowing issues, if any: Patient does have some swallowing issues. Patient crushes medication through the tube. Smoking or chewing tobacco? Patient denies. Using fluoride toothpaste daily? Patient is using fluoride toothpaste Last ENT visit was on: Not since diagnosis Other notable issues, if any:  With patients port, he says he experiences pain behind his right eye with certain movements.

## 2023-12-13 ENCOUNTER — Other Ambulatory Visit: Payer: Self-pay

## 2023-12-13 ENCOUNTER — Other Ambulatory Visit (HOSPITAL_COMMUNITY): Payer: Self-pay

## 2023-12-13 ENCOUNTER — Ambulatory Visit
Admission: RE | Admit: 2023-12-13 | Discharge: 2023-12-13 | Disposition: A | Discharge status: Home / Self Care | Payer: BC Managed Care – PPO | Source: Ambulatory Visit | Attending: Radiation Oncology | Admitting: Radiation Oncology

## 2023-12-13 ENCOUNTER — Encounter: Payer: Self-pay | Admitting: Radiation Oncology

## 2023-12-13 ENCOUNTER — Inpatient Hospital Stay: Payer: BC Managed Care – PPO | Attending: Oncology | Admitting: Dietician

## 2023-12-13 VITALS — BP 124/83 | HR 79 | Temp 97.8°F | Resp 18 | Ht 68.0 in | Wt 157.1 lb

## 2023-12-13 DIAGNOSIS — K1233 Oral mucositis (ulcerative) due to radiation: Secondary | ICD-10-CM

## 2023-12-13 DIAGNOSIS — C090 Malignant neoplasm of tonsillar fossa: Secondary | ICD-10-CM | POA: Insufficient documentation

## 2023-12-13 DIAGNOSIS — Z7901 Long term (current) use of anticoagulants: Secondary | ICD-10-CM | POA: Insufficient documentation

## 2023-12-13 DIAGNOSIS — D701 Agranulocytosis secondary to cancer chemotherapy: Secondary | ICD-10-CM | POA: Insufficient documentation

## 2023-12-13 HISTORY — DX: Acute embolism and thrombosis of unspecified deep veins of unspecified lower extremity: I82.409

## 2023-12-13 MED ORDER — HYDROCODONE-ACETAMINOPHEN 7.5-325 MG/15ML PO SOLN
10.0000 mL | ORAL | 0 refills | Status: DC | PRN
  Filled 2023-12-13: qty 500, 9d supply, fill #0

## 2023-12-13 NOTE — Progress Notes (Signed)
 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 Chemotherapy discontinued secondary to side effects. S/p PEG 10/24.  Spoke with patient via telephone. Patient reports doing well overall. He is been increasing oral intake and tolerating regular textures well. Patient recalls bacon, eggs, biscuit as well as spaghetti last week. States the noodles got a little big in his mouth, but swallowed without difficulty. Over the last couple of weeks, patient has not been able to tolerate 2 carton bolus feedings as he previously was. Patient reports nausea and vomiting. He has been giving one carton of Osmolite every few hours. Reports he is getting in 6 cartons most every day. Wife is concerned that he has lost weight.   Medications: reviewed   Labs: reviewed   Anthropometrics: Wt 157 lb 2 oz today decreased 4% in 4 weeks - concerning   12/16 - 163.4 lb   Estimated Energy Needs  Kcals: 2450-2850 Protein: 122-139 Fluid: >/= 2.5 L  NUTRITION DIAGNOSIS: Inadequate oral intake continues, but improving   INTERVENTION:  Strongly encouraged pt to increase oral intake and wean from tube given he is tolerating regular diet Suggested eating 3 meals and supplementing with Ensure Complete or bolus of Osmolite 3x/day - pt reports understanding of plan (samples of Ensure Complete left at registration desk, pt aware) Encouraged high calorie high protein foods to support wt maintenance/healing    MONITORING, EVALUATION, GOAL: wt trends, intake, TF   NEXT VISIT: Tuesday January 28 via telephone

## 2023-12-13 NOTE — Progress Notes (Signed)
 Radiation Oncology         [phone] ________________________________  Name: Harry Williams MRN: [id number]  Date: 12/13/2023  DOB: 11-24-1981  Follow-Up Visit Note  CC: [name], MD  Denys Reyes Prom*  Diagnosis and Prior Radiotherapy:    C09.0   ICD-10-CM   1. Malignant neoplasm of tonsillar fossa (HCC)  C09.0     2. 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 He finished his treatment on 11/16/2023.  Pain issues, if any: Denies Using a feeding tube?: Patient is able to take in some food and fluids. Patient is using a feeding tube. Weight changes, if any: Patient has lost 30 pounds within three months. Swallowing issues, if any: Patient does have some swallowing issues. Patient crushes medication through the tube. Smoking or chewing tobacco? Patient denies. Using fluoride toothpaste daily? Patient is using fluoride toothpaste Last ENT visit was on: Not since diagnosis Other notable issues, if any:  With patients port, he says he experiences pain behind his right eye with certain movements.                     ALLERGIES:  has no known allergies.  Meds: Current Outpatient Medications  Medication Sig Dispense Refill   baclofen  (LIORESAL ) 10 MG tablet Take 1 tablet (10 mg total) by mouth 3 (three) times daily. 30 each 0   celecoxib  (CELEBREX ) 200 MG capsule Place 1  capsule (200 mg total) into feeding tube daily as needed for moderate pain (pain score 4-6). 90 capsule 0   fentaNYL  (DURAGESIC ) 50 MCG/HR Place 1 patch onto the skin every 3 (three) days. 10 patch 0   HYDROcodone -acetaminophen  (HYCET) 7.5-325 mg/15 ml solution Take 10 mLs by mouth every 4 (four) hours as needed for moderate pain (pain score 4-6). 500 mL 0   lidocaine  (XYLOCAINE ) 2 % solution Patient: Mix 1part 2% viscous lidocaine , 1part H20. Swish & swallow 10mL of diluted mixture, before meals and at bedtime, up to QID (Patient taking differently: 10 mLs See admin instructions. Mix 1 part 2% viscous lidocaine  with 1 part water. Swish & swallow 10 ml's of diluted mixture up to 4 times a day.) 200 mL 3   lidocaine -prilocaine  (EMLA ) cream Apply to affected area once (Patient taking differently: Apply 1 Application topically once.) 30 g 3   Nutritional Supplements (FEEDING SUPPLEMENT, OSMOLITE 1.5 CAL,) LIQD 7 cartons Osmolite 1.5 split over four feeding daily. Flush tube with 60 ml water before and after each bolus. Provide additional 3 1/2 cups (830 ml) water/day to meet hydration needs. Provides 2485 kcal, 104.3 g protein, 1267 ml free water from formula (2577 ml total water with flushes). 1659 ml/day meets 100% RDI     omeprazole  (PRILOSEC) 20 MG capsule 20 mg See admin instructions. 20 mg per tube once a day     ondansetron  (ZOFRAN ) 8 MG tablet Take 1 tablet (8 mg total) by mouth every 8 (eight) hours as  needed for nausea or vomiting. Start on the third day after cisplatin . (Patient taking differently: Place 8 mg into feeding tube every 8 (eight) hours as needed for nausea or vomiting. Start on the third day after cisplatin .) 30 tablet 1   prochlorperazine  (COMPAZINE ) 10 MG tablet Place 1 tablet (10 mg total) into feeding tube every 6 (six) hours as needed for nausea or vomiting. 30 tablet 2   scopolamine  (TRANSDERM-SCOP) 1 MG/3DAYS Place 1 patch (1.5 mg total) onto the skin every 3 (three) days.  10 patch 2   Cholecalciferol (VITAMIN D3) 1000 units CAPS Place 1,000 Units into feeding tube daily. (Patient not taking: Reported on 12/13/2023)     dexamethasone  (DECADRON ) 4 MG tablet Place 8 mg into feeding tube See admin instructions. 8 mg, per tube, once a day for three days- starting the day after Cisplatin  chemotherapy (Patient not taking: Reported on 12/13/2023)     enoxaparin  (LOVENOX ) 40 MG/0.4ML injection Inject 0.8 mLs (80 mg total) into the skin every 12 (twelve) hours. (Patient not taking: Reported on 12/13/2023) 48 mL 0   PARoxetine  (PAXIL  CR) 12.5 MG 24 hr tablet Take 1 tablet (12.5 mg total) by mouth daily. (Patient not taking: Reported on 11/01/2023) 30 tablet 0   No current facility-administered medications for this encounter.    Physical Findings: The patient is in no acute distress. Patient is alert and oriented. Wt Readings from Last 3 Encounters:  12/13/23 157 lb 2 oz (71.3 kg)  11/18/23 169 lb (76.7 kg)  11/01/23 171 lb (77.6 kg)    height is 5' 8 (1.727 m) and weight is 157 lb 2 oz (71.3 kg). His temporal temperature is 97.8 F (36.6 C). His blood pressure is 124/83 and his pulse is 79. His respiration is 18 and oxygen saturation is 98%. .  General: Alert and oriented, in no acute distress HEENT: Head is normocephalic. Extraocular movements are intact. Oropharynx is notable for no concerning lesions. Tongue is midline. Neck: Neck is notable for no concerning adenopathy.  Skin: Skin in treatment fields shows satisfactory healing with some areas of dry skin.  Heart: Regular in rate and rhythm with no murmurs, rubs, or gallops. Chest: Clear to auscultation bilaterally, with no rhonchi, wheezes, or rales. Abdomen: Soft, nontender, nondistended, with no rigidity or guarding. Extremities: No cyanosis or edema. Lymphatics: see Neck Exam Psychiatric: Judgment and insight are intact. Affect is appropriate.   Lab Findings: Lab Results  Component Value Date   WBC 4.6  11/18/2023   WBC 4.5 11/18/2023   HGB 13.1 11/18/2023   HGB 13.4 11/18/2023   HCT 37.4 (L) 11/18/2023   HCT 38.4 (L) 11/18/2023   MCV 89.0 11/18/2023   MCV 89.3 11/18/2023   PLT 290 11/18/2023   PLT 317 11/18/2023    Lab Results  Component Value Date   TSH 3.827 09/15/2023    Radiographic Findings: CT Chest W Contrast Result Date: 11/18/2023 CLINICAL DATA:  Port pain EXAM: CT CHEST WITH CONTRAST TECHNIQUE: Multidetector CT imaging of the chest was performed during intravenous contrast administration. RADIATION DOSE REDUCTION: This exam was performed according to the departmental dose-optimization program which includes automated exposure control, adjustment of the mA and/or kV according to patient size and/or use of iterative reconstruction technique. CONTRAST:  75mL OMNIPAQUE  IOHEXOL  300 MG/ML SOLN, 75mL OMNIPAQUE  IOHEXOL  300 MG/ML SOLN COMPARISON:  Chest x-ray 11/01/2023, PET CT 09/07/2023 FINDINGS: Cardiovascular: Right-sided central venous port with tip at the cavoatrial junction. Hypodense filling defect within the right jugular vein  which is expanded, consistent with thrombus. SVC appears patent. Normal cardiac size. No pericardial effusion. Nonaneurysmal aorta. Mediastinum/Nodes: Patent trachea. No suspicious thyroid  mass. No suspicious lymph nodes. Esophagus within normal limits Lungs/Pleura: Lungs are clear. No pleural effusion or pneumothorax. Upper Abdomen: No acute abnormality. Musculoskeletal: No chest wall abnormality. No acute or significant osseous findings. IMPRESSION: 1. 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 Clear lung fields Electronically Signed   By: Luke Bun M.D.   On: 11/18/2023 19:48   CT Soft Tissue Neck W Contrast Result Date: 11/18/2023 CLINICAL DATA:  Right port pain, right-sided neck pain EXAM: CT NECK WITH CONTRAST TECHNIQUE: Multidetector CT imaging of the neck was performed using the standard protocol following the bolus  administration of intravenous contrast. RADIATION DOSE REDUCTION: This exam was performed according to the departmental dose-optimization program which includes automated exposure control, adjustment of the mA and/or kV according to patient size and/or use of iterative reconstruction technique. CONTRAST:  75mL OMNIPAQUE  IOHEXOL  300 MG/ML SOLN, 75mL OMNIPAQUE  IOHEXOL  300 MG/ML SOLN COMPARISON:  07/20/2023 CT neck FINDINGS: Pharynx and larynx: Previously noted mass in the left tonsillar/tongue base region is no longer definitively seen; possible minimal asymmetry in the region the prior mass (series 2, image 73). At the right aspect of the lingual tonsils, there is a 5 mm focus of low density, with surrounding enhancement (series 2, image 80), which is new from prior exam and may represent a small retention cyst. The larynx and pharynx are otherwise unremarkable. Salivary glands: No inflammation, mass, or stone. Thyroid : Normal. Lymph nodes: A previously noted enlarged lymph node at left level 2 is difficult to visualize but likely measures up to 8 mm in short axis (series 2, image 72), previously 17 mm. Prominent right level 2 lymph node measures up to 8 mm in short axis. Vascular: Nonopacification of the right IJ just past its origin (series 2, image 49 and series 5, image 50), with likely filling defects. The remainder of the right IJ is enlarged and non-opacified (series 2, image 107), with surrounding inflammatory changes in the right carotid space, with surrounding low-density material and prominent, hyperenhancing lymph nodes, including the right level 2 lymph node described above. After the insertion of the right chest port catheter, the vein appears opacified. Limited intracranial: Negative. Visualized orbits: Negative. Mastoids and visualized paranasal sinuses: Overall clear. Skeleton: No acute osseous abnormality. Upper chest: Please see same-day CT chest. IMPRESSION: 1. Nonopacification of the right IJ  just past its origin, with surrounding inflammatory changes, concerning for thrombophlebitis related to the port catheter. With associated with low-density material in the right carotid space, possibly phlegmon, with prominent, hyperenhancing lymph nodes, favored to be reactive. Previously noted mass in the left tonsillar/tongue base region is no longer definitively seen, with only possible minimal asymmetry at the left on vast remaining. 2. A previously noted enlarged lymph node at left level 2 is difficult to visualize but likely measures up to 8 mm in short axis, previously 17 mm. 3.  For findings in the thorax, please see same day CT chest. Electronically Signed   By: Donald Campion M.D.   On: 11/18/2023 18:47    Impression/Plan:    1) Head and Neck Cancer Status: Patient continues to heal from the effects of radiation treatment. Encouraged patient to use Sonafine today as he has some residual dry skin within the treatment field.   2) Nutritional Status: He is drinking fluids and starting to eat some solids. He met  with nutrition today. Encouraged patient to continue to push oral intake.  Wt Readings from Last 3 Encounters:  12/13/23 157 lb 2 oz (71.3 kg)  11/18/23 169 lb (76.7 kg)  11/01/23 171 lb (77.6 kg)  PEG tube: In-place, patient reports getting ~6 cartons/day  3) Risk Factors: The patient has been educated about risk factors including alcohol and tobacco abuse; they understand that avoidance of alcohol and tobacco is important to prevent recurrences as well as other cancers  4) Swallowing: functional, continues to see SLP. He is scheduled to see Lupita on 01/04/2024.   5) Dental: Encouraged to continue regular followup with dentistry, and dental hygiene including fluoride rinses.   6) Thyroid  function: Checking annually Lab Results  Component Value Date   TSH 3.827 09/15/2023    8) PET in 3 months with an appointment with Dr. Izell to review the images. He is scheduled to see Dr.  Autumn on 12/15/23. The patient was encouraged to call with any issues or questions before then.  On date of service, in total, I spent 20 minutes on this encounter. Patient was seen in person. _____________________________________    Leeroy Due, PA-C

## 2023-12-14 ENCOUNTER — Other Ambulatory Visit: Payer: Self-pay

## 2023-12-14 ENCOUNTER — Other Ambulatory Visit: Payer: Self-pay | Admitting: Radiology

## 2023-12-14 DIAGNOSIS — C090 Malignant neoplasm of tonsillar fossa: Secondary | ICD-10-CM

## 2023-12-14 NOTE — Addendum Note (Signed)
 Encounter addended by: Pearlene Bouchard, PA-C on: 12/14/2023 9:30 AM  Actions taken: Follow-up modified

## 2023-12-15 ENCOUNTER — Inpatient Hospital Stay: Payer: BC Managed Care – PPO

## 2023-12-15 ENCOUNTER — Other Ambulatory Visit: Payer: Self-pay | Admitting: Oncology

## 2023-12-15 ENCOUNTER — Inpatient Hospital Stay (HOSPITAL_BASED_OUTPATIENT_CLINIC_OR_DEPARTMENT_OTHER): Payer: BC Managed Care – PPO | Admitting: Oncology

## 2023-12-15 VITALS — BP 117/73 | HR 97 | Temp 98.4°F | Resp 17 | Wt 160.4 lb

## 2023-12-15 DIAGNOSIS — G893 Neoplasm related pain (acute) (chronic): Secondary | ICD-10-CM | POA: Diagnosis not present

## 2023-12-15 DIAGNOSIS — D1391 Familial adenomatous polyposis: Secondary | ICD-10-CM | POA: Diagnosis not present

## 2023-12-15 DIAGNOSIS — I82C11 Acute embolism and thrombosis of right internal jugular vein: Secondary | ICD-10-CM

## 2023-12-15 DIAGNOSIS — Z7901 Long term (current) use of anticoagulants: Secondary | ICD-10-CM | POA: Diagnosis not present

## 2023-12-15 DIAGNOSIS — C090 Malignant neoplasm of tonsillar fossa: Secondary | ICD-10-CM

## 2023-12-15 DIAGNOSIS — D701 Agranulocytosis secondary to cancer chemotherapy: Secondary | ICD-10-CM | POA: Diagnosis not present

## 2023-12-15 DIAGNOSIS — K649 Unspecified hemorrhoids: Secondary | ICD-10-CM

## 2023-12-15 LAB — CBC WITH DIFFERENTIAL (CANCER CENTER ONLY)
Abs Immature Granulocytes: 0.02 10*3/uL (ref 0.00–0.07)
Basophils Absolute: 0 10*3/uL (ref 0.0–0.1)
Basophils Relative: 0 %
Eosinophils Absolute: 0.4 10*3/uL (ref 0.0–0.5)
Eosinophils Relative: 6 %
HCT: 33.6 % — ABNORMAL LOW (ref 39.0–52.0)
Hemoglobin: 11.8 g/dL — ABNORMAL LOW (ref 13.0–17.0)
Immature Granulocytes: 0 %
Lymphocytes Relative: 5 %
Lymphs Abs: 0.3 10*3/uL — ABNORMAL LOW (ref 0.7–4.0)
MCH: 31 pg (ref 26.0–34.0)
MCHC: 35.1 g/dL (ref 30.0–36.0)
MCV: 88.2 fL (ref 80.0–100.0)
Monocytes Absolute: 0.6 10*3/uL (ref 0.1–1.0)
Monocytes Relative: 10 %
Neutro Abs: 4.9 10*3/uL (ref 1.7–7.7)
Neutrophils Relative %: 79 %
Platelet Count: 190 10*3/uL (ref 150–400)
RBC: 3.81 MIL/uL — ABNORMAL LOW (ref 4.22–5.81)
RDW: 13.5 % (ref 11.5–15.5)
WBC Count: 6.2 10*3/uL (ref 4.0–10.5)
nRBC: 0 % (ref 0.0–0.2)

## 2023-12-15 LAB — MAGNESIUM: Magnesium: 1.9 mg/dL (ref 1.7–2.4)

## 2023-12-15 LAB — CMP (CANCER CENTER ONLY)
ALT: 25 U/L (ref 0–44)
AST: 26 U/L (ref 15–41)
Albumin: 3.7 g/dL (ref 3.5–5.0)
Alkaline Phosphatase: 69 U/L (ref 38–126)
Anion gap: 5 (ref 5–15)
BUN: 22 mg/dL — ABNORMAL HIGH (ref 6–20)
CO2: 29 mmol/L (ref 22–32)
Calcium: 9.2 mg/dL (ref 8.9–10.3)
Chloride: 103 mmol/L (ref 98–111)
Creatinine: 0.91 mg/dL (ref 0.61–1.24)
GFR, Estimated: >60 mL/min (ref >60)
Glucose, Bld: 132 mg/dL — ABNORMAL HIGH (ref 70–99)
Potassium: 4.4 mmol/L (ref 3.5–5.1)
Sodium: 137 mmol/L (ref 135–145)
Total Bilirubin: 0.3 mg/dL (ref 0.0–1.2)
Total Protein: 6.3 g/dL — ABNORMAL LOW (ref 6.5–8.1)

## 2023-12-15 MED ORDER — APIXABAN 5 MG PO TABS: 5.0000 mg | ORAL_TABLET | Freq: Two times a day (BID) | ORAL | 1 refills | Status: DC

## 2023-12-15 NOTE — Assessment & Plan Note (Addendum)
-  Pain is slowly improving.  He is not needing pain medication as often now.

## 2023-12-15 NOTE — Progress Notes (Signed)
[name]  ONCOLOGY CLINIC PROGRESS NOTE   Patient Care Team: [name], MD as PCP - General (Family Medicine) Noe Gens, MD as Referring Physician (Otolaryngology) [name], MD as Attending Physician (Radiation Oncology) [name], [name], RN as Oncology Nurse Navigator [name], MD as Consulting Physician (Oncology)  Date of visit: 12/15/2023   ASSESSMENT & PLAN:   Very pleasant 43 y.o. 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  Treated with concurrent chemoradiation with weekly cisplatin starting from 09/29/2023.  Received 4 doses of weekly cisplatin.  Rest of the chemo was held because of neutropenia and also per patient preference.  Malignant neoplasm of tonsillar fossa (HCC) - Recently diagnosed left tonsillar squamous cell carcinoma with a 3 cm lymph node. cT2,cN1,cM0, p16+.   - Previously discussed the benefits and risks of radiation alone vs combination of chemotherapy and radiation. Given the patient's age and the size of the lymph node which is borderline at 3 cm, the benefits of adding chemotherapy slightly outweigh the risks. After discussing side effect profile, patient did opt for weekly cisplatin.  Plan made to proceed with weekly cisplatin at 40 mg/m dose during the course of radiation with appropriate pre and post hydration and IV nausea medications.  -He started radiation treatments from 09/27/2023.  Started first dose of cisplatin on 09/29/2023.  -On the same day that he received first dose of cisplatin, he developed visual changes, described as a "3D" effect and double vision. Although very rare, optic neuritis has been reported with cisplatin use on literature review, especially when used in  combination with other chemotherapeutic agents.  Though it is rare to happen with just 1 dose of cisplatin, given his symptoms, an ophthalmology referral was placed and we held Cisplatin on 10/05/23. Dr. Vanessa Barbara with ophthalmology evaluated patient on 10/06/2023.  No signs of optic neuritis. Patient was cleared to resume cisplatin.  - We resumed cisplatin from 10/14/2023 and continued this weekly during the course of radiation.  - He received total of 4 doses of cisplatin. He was hospitalized on 11/01/2023 for neutropenic fever, pain from mucositis.  ANC slowly improved.  Infectious workup was negative.  He was discharged home on 11/05/2023.  -Given issues with neutropenia and the fact that his mucositis was getting worse with chemotherapy, patient opted to not receive further chemotherapy. He completed radiation treatments on 11/16/2023.    -He is slowly recovering from treatment-related side effects.  Swallowing function has been improving.  No longer has mouth sores.  Cancer associated pain -Pain is slowly improving.  He is not needing pain medication as often now.  Familial adenomatous polyposis -Underwent total colectomy in 2016. Currently experiencing watery bowel movements approximately seven times a day. -We will discuss referral to a genetic counselor on return visit, now that he has completed treatments for his head and neck cancer.  Bleeding hemorrhoid Reports daily bleeding from hemorrhoids. Currently using prescribed cream with limited relief. Surgical intervention not possible while on blood thinners.  - Continue using prescribed hemorrhoid cream - Reassess hemorrhoid treatment options after discontinuation of blood thinners  Internal jugular (IJ) vein thromboembolism, acute, right (HCC) Blood clot near the port site discovered in late December 2024. Currently off of Lovenox injections for a week, as he ran out. Plan to switch to  Eliquis (apixaban) for continued anticoagulation  therapy for at least two more months. Discussed risks of continued anticoagulation including bleeding and benefits of preventing clot progression.   A CT scan will be done in March to assess clot status and also his head and neck cancer status before considering port removal.  - Prescribed Eliquis 5 mg twice daily  RTC in March with CT results.  I reviewed lab results and outside records for this visit and discussed relevant results with the patient. Diagnosis, plan of care and treatment options were also discussed in detail with the patient. Opportunity provided to ask questions and answers provided to his apparent satisfaction. Provided instructions to call our clinic with any problems, questions or concerns prior to return visit. I recommended to continue follow-up with PCP and sub-specialists. He verbalized understanding and agreed with the plan.   NCCN guidelines have been consulted in the planning of this patient's care.  I provided 30 minutes of face-to-face time during this encounter and > 50% was spent counseling as documented under my assessment and plan.    [name], MD  12/15/2023 4:13 PM  [facility] [address] [place] [address] Dept: [phone] Dept Fax: [phone]    CHIEF COMPLAINT/ REASON FOR VISIT:   Recently diagnosed squamous of carcinoma of left tonsillar fossa, p16 positive.  Current Treatment: Received chemoradiation with weekly cisplatin, chemo started from 09/29/2023.  Chemotherapy complicated by neutropenia, neutropenic fever.  He received only 4 of the planned 7 cycles of cisplatin.  INTERVAL HISTORY:    Discussed the use of AI scribe software for clinical note transcription with the patient, who gave verbal consent to proceed.   Harry Williams is here today for repeat clinical assessment.   He reports an improvement in mouth pain, which was previously severe enough to necessitate the use of a  feeding tube. The patient now only requires pain medication once a day, and has started eating orally again. He reports pain when yawning, which he attributes to stretching in the mouth.  The patient also has a history of a blood clot related to a port, for which he has been taking blood thinners. He stopped the injections about a week ago and has not been given a stop date for the blood thinners. The patient reports daily bleeding from hemorrhoids, which has been ongoing. He has not sought treatment for the hemorrhoids, but has been using a prescribed cream.  I have reviewed the past medical history, past surgical history, social history and family history with the patient and they are unchanged from previous note.  ALLERGIES: He has no known allergies.  MEDICATIONS:  Current Outpatient Medications  Medication Sig Dispense Refill   apixaban (ELIQUIS) 5 MG TABS tablet Take 1 tablet (5 mg total) by mouth 2 (two) times daily. 60 tablet 1   baclofen (LIORESAL) 10 MG tablet Take 1 tablet (10 mg total) by mouth 3 (three) times daily. 30 each 0   celecoxib (CELEBREX) 200 MG capsule Place 1 capsule (200 mg total) into feeding tube daily as needed for moderate pain (pain score 4-6). 90 capsule 0   fentaNYL (DURAGESIC) 50 MCG/HR Place 1 patch onto the skin every 3 (three) days. 10 patch 0   HYDROcodone-acetaminophen (HYCET) 7.5-325 mg/15 ml solution Take 10 mLs by mouth every 4 (four) hours as needed for moderate pain (pain score 4-6). 500 mL 0   lidocaine (XYLOCAINE) 2 % solution Patient: Mix 1part 2% viscous  lidocaine, 1part H20. Swish & swallow 10mL of diluted mixture, before meals and at bedtime, up to QID (Patient taking differently: 10 mLs See admin instructions. Mix 1 part 2% viscous lidocaine with 1 part water. Swish & swallow 10 ml's of diluted mixture up to 4 times a day.) 200 mL 3   Nutritional Supplements (FEEDING SUPPLEMENT, OSMOLITE 1.5 CAL,) LIQD 7 cartons Osmolite 1.5 split over four  feeding daily. Flush tube with 60 ml water before and after each bolus. Provide additional 3 1/2 cups (830 ml) water/day to meet hydration needs. Provides 2485 kcal, 104.3 g protein, 1267 ml free water from formula (2577 ml total water with flushes). 1659 ml/day meets 100% RDI     omeprazole (PRILOSEC) 20 MG capsule 20 mg See admin instructions. 20 mg per tube once a day     scopolamine (TRANSDERM-SCOP) 1 MG/3DAYS Place 1 patch (1.5 mg total) onto the skin every 3 (three) days. 10 patch 2   dexamethasone (DECADRON) 4 MG tablet Place 8 mg into feeding tube See admin instructions. 8 mg, per tube, once a day for three days- starting the day after Cisplatin chemotherapy (Patient not taking: Reported on 12/13/2023)     PARoxetine (PAXIL CR) 12.5 MG 24 hr tablet Take 1 tablet (12.5 mg total) by mouth daily. (Patient not taking: Reported on 11/01/2023) 30 tablet 0   No current facility-administered medications for this visit.    HISTORY OF PRESENT ILLNESS:   Oncology History  Malignant neoplasm of tonsillar fossa (HCC)  07/20/2023 Imaging   CT neck soft tissue: 2.5 x 3 cm mass in the left tonsillar region with extension towards the tongue base, suspicious for carcinoma, and evidence of level 2 lymphadenopathy on the left, consisting of a dominant node measuring 3 x 2 x 1.7 cm. A benign appearing osteoma was also demonstrated, projecting inferior from the angle of the left mandible.    08/26/2023 Initial Diagnosis   Malignant neoplasm of tonsillar fossa [facility]):  He presented to his PCP for routine follow-up in July 2024 and was incidentally noted to have left submandibular swelling on a routine examination performed at that time. A soft tissue head and neck ultrasound was subsequently performed on 07/07/23 which showed an ovoid mass measuring 2.8 x 1.9 cm, most consistent with an abnormally enlarged lymph node, and correlating with the palpable area in the left submandibular region.    A soft tissue neck CT  was also performed on 07/20/23 which demonstrated: a  2.5 x 3 cm mass in the left tonsillar region with extension towards the tongue base, suspicious for carcinoma, and evidence of level 2 lymphadenopathy on the left, consisting of a dominant node measuring 3 x 2 x 1.7 cm. A benign appearing osteoma was also demonstrated, projecting inferior from the angle of the left mandible.   Subsequently, the patient was referred to Dr. Roma Schanz at [facility] and underwent biopsies of the left tonsil on 08/26/23. 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    08/26/2023 Pathology Results   Patient was referred to Dr. Roma Schanz at [facility] and underwent biopsies of the left tonsil on 08/26/23. 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    09/07/2023 PET scan   PET scan performed on 09/07/23 at [facility]: intense radiotracer uptake within the left palatine tonsillar mass; adjacent radiotracer uptake within the region of the right palatine tonsil; and hypermetabolic nodes in the left neck extending  to the supraclavicular region concerning for nodal metastases. Focal uptake at the anorectal junction was also demonstrated, possibly reflecting muscular contraction, however local malignancy can not be excluded. PET otherwise showed no evidence of more distant metastatic disease.    09/14/2023 Cancer Staging   Staging form: Pharynx - HPV-Mediated Oropharynx, AJCC 8th Edition - Clinical stage from 09/14/2023: Stage I (cT2, cN1, cM0, p16+) - Signed by [name], MD on 09/14/2023 Stage prefix: Initial diagnosis   09/29/2023 - 10/21/2023 Chemotherapy   Patient is on Treatment Plan : HEAD/NECK Cisplatin (40) q7d         REVIEW OF SYSTEMS:   Review of Systems - Oncology  All other pertinent systems were reviewed with the patient and are negative.   VITALS:   Blood pressure 117/73, pulse 97,  temperature 98.4 F (36.9 C), temperature source Temporal, resp. rate 17, weight 160 lb 6.4 oz (72.8 kg), SpO2 98%.  Wt Readings from Last 3 Encounters:  12/15/23 160 lb 6.4 oz (72.8 kg)  12/13/23 157 lb 2 oz (71.3 kg)  11/18/23 169 lb (76.7 kg)    Body mass index is 24.39 kg/m.  Performance status (ECOG): 1 - Symptomatic but completely ambulatory  PHYSICAL EXAM:   Physical Exam Constitutional:      General: He is not in acute distress.    Appearance: Normal appearance.  HENT:     Head: Normocephalic and atraumatic.     Mouth/Throat:     Mouth: Mucous membranes are moist.     Comments: Erythema from radiation-induced changes in the back of the throat, within expected range. Eyes:     General: No scleral icterus.    Extraocular Movements: Extraocular movements intact.     Conjunctiva/sclera: Conjunctivae normal.  Cardiovascular:     Rate and Rhythm: Normal rate and regular rhythm.     Pulses: Normal pulses.     Heart sounds: Normal heart sounds.  Pulmonary:     Effort: Pulmonary effort is normal.     Breath sounds: Normal breath sounds.  Chest:     Comments: Right-sided Port-A-Cath in place without any signs of infection Abdominal:     General: There is no distension.  Musculoskeletal:     Right lower leg: No edema.     Left lower leg: No edema.  Lymphadenopathy:     Cervical: No cervical adenopathy.  Skin:    Findings: No rash.  Neurological:     General: No focal deficit present.     Mental Status: He is alert and oriented to person, place, and time.  Psychiatric:        Mood and Affect: Mood normal.        Behavior: Behavior normal.        Thought Content: Thought content normal.        Judgment: Judgment normal.      LABORATORY DATA:   I have reviewed the data as listed.   Results for orders placed or performed in visit on 12/15/23 (from the past 72 hours)  Magnesium     Status: None   Collection Time: 12/15/23  3:09 PM  Result Value Ref Range    Magnesium 1.9 1.7 - 2.4 mg/dL    Comment: Performed at [facility], [address], [place], [place] [address]  CMP (Cancer Center only)     Status: Abnormal   Collection Time: 12/15/23  3:09 PM  Result Value Ref Range   Sodium 137 135 - 145 mmol/L  Potassium 4.4 3.5 - 5.1 mmol/L   Chloride 103 98 - 111 mmol/L   CO2 29 22 - 32 mmol/L   Glucose, Bld 132 (H) 70 - 99 mg/dL    Comment: Glucose reference range applies only to samples taken after fasting for at least 8 hours.   BUN 22 (H) 6 - 20 mg/dL   Creatinine 2.70 6.23 - 1.24 mg/dL   Calcium 9.2 8.9 - 76.2 mg/dL   Total Protein 6.3 (L) 6.5 - 8.1 g/dL   Albumin 3.7 3.5 - 5.0 g/dL   AST 26 15 - 41 U/L   ALT 25 0 - 44 U/L   Alkaline Phosphatase 69 38 - 126 U/L   Total Bilirubin 0.3 0.0 - 1.2 mg/dL   GFR, Estimated >83 >15 mL/min    Comment: (NOTE) Calculated using the CKD-EPI Creatinine Equation (2021)    Anion gap 5 5 - 15    Comment: Performed at [facility], [address], [place], [place] [address]  CBC with Differential (Cancer Center Only)     Status: Abnormal   Collection Time: 12/15/23  3:09 PM  Result Value Ref Range   WBC Count 6.2 4.0 - 10.5 K/uL   RBC 3.81 (L) 4.22 - 5.81 MIL/uL   Hemoglobin 11.8 (L) 13.0 - 17.0 g/dL   HCT 07.3 (L) 71.0 - 62.6 %   MCV 88.2 80.0 - 100.0 fL   MCH 31.0 26.0 - 34.0 pg   MCHC 35.1 30.0 - 36.0 g/dL   RDW 94.8 54.6 - 27.0 %   Platelet Count 190 150 - 400 K/uL   nRBC 0.0 0.0 - 0.2 %   Neutrophils Relative % 79 %   Neutro Abs 4.9 1.7 - 7.7 K/uL   Lymphocytes Relative 5 %   Lymphs Abs 0.3 (L) 0.7 - 4.0 K/uL   Monocytes Relative 10 %   Monocytes Absolute 0.6 0.1 - 1.0 K/uL   Eosinophils Relative 6 %   Eosinophils Absolute 0.4 0.0 - 0.5 K/uL   Basophils Relative 0 %   Basophils Absolute 0.0 0.0 - 0.1 K/uL   Immature Granulocytes 0 %   Abs Immature Granulocytes 0.02 0.00 - 0.07 K/uL    Comment: Performed at [facility], [address], [place], [place] [address]       RADIOGRAPHIC STUDIES:  I have personally reviewed the radiological images as listed and agree with the findings in the report.  CT Chest W Contrast Result Date: 11/18/2023 CLINICAL DATA:  Port pain EXAM: CT CHEST WITH CONTRAST TECHNIQUE: Multidetector CT imaging of the chest was performed during intravenous contrast administration. RADIATION DOSE REDUCTION: This exam was performed according to the departmental dose-optimization program which includes automated exposure control, adjustment of the mA and/or kV according to patient size and/or use of iterative reconstruction technique. CONTRAST:  75mL OMNIPAQUE IOHEXOL 300 MG/ML SOLN, 75mL OMNIPAQUE IOHEXOL 300 MG/ML SOLN COMPARISON:  Chest x-ray 11/01/2023, PET CT 09/07/2023 FINDINGS: Cardiovascular: Right-sided central venous port with tip at the cavoatrial junction. Hypodense filling defect within the right jugular vein which is expanded, consistent with thrombus. SVC appears patent. Normal cardiac size. No pericardial effusion. Nonaneurysmal aorta. Mediastinum/Nodes: Patent trachea. No suspicious thyroid mass. No suspicious lymph nodes. Esophagus within normal limits Lungs/Pleura: Lungs are clear. No pleural effusion or pneumothorax. Upper Abdomen: No acute abnormality. Musculoskeletal: No chest wall abnormality. No acute or significant osseous findings. IMPRESSION: 1. 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 Clear  lung fields Electronically Signed   By: Jasmine Pang M.D.   On: 11/18/2023 19:48   CT Soft Tissue Neck W Contrast Result Date: 11/18/2023 CLINICAL DATA:  Right port pain, right-sided neck pain EXAM: CT NECK WITH CONTRAST TECHNIQUE: Multidetector CT imaging of the neck was performed using the standard protocol following the bolus administration of intravenous contrast. RADIATION DOSE REDUCTION: This exam was performed according to the departmental  dose-optimization program which includes automated exposure control, adjustment of the mA and/or kV according to patient size and/or use of iterative reconstruction technique. CONTRAST:  75mL OMNIPAQUE IOHEXOL 300 MG/ML SOLN, 75mL OMNIPAQUE IOHEXOL 300 MG/ML SOLN COMPARISON:  07/20/2023 CT neck FINDINGS: Pharynx and larynx: Previously noted mass in the left tonsillar/tongue base region is no longer definitively seen; possible minimal asymmetry in the region the prior mass (series 2, image 73). At the right aspect of the lingual tonsils, there is a 5 mm focus of low density, with surrounding enhancement (series 2, image 80), which is new from prior exam and may represent a small retention cyst. The larynx and pharynx are otherwise unremarkable. Salivary glands: No inflammation, mass, or stone. Thyroid: Normal. Lymph nodes: A previously noted enlarged lymph node at left level 2 is difficult to visualize but likely measures up to 8 mm in short axis (series 2, image 72), previously 17 mm. Prominent right level 2 lymph node measures up to 8 mm in short axis. Vascular: Nonopacification of the right IJ just past its origin (series 2, image 49 and series 5, image 50), with likely filling defects. The remainder of the right IJ is enlarged and non-opacified (series 2, image 107), with surrounding inflammatory changes in the right carotid space, with surrounding low-density material and prominent, hyperenhancing lymph nodes, including the right level 2 lymph node described above. After the insertion of the right chest port catheter, the vein appears opacified. Limited intracranial: Negative. Visualized orbits: Negative. Mastoids and visualized paranasal sinuses: Overall clear. Skeleton: No acute osseous abnormality. Upper chest: Please see same-day CT chest. IMPRESSION: 1. Nonopacification of the right IJ just past its origin, with surrounding inflammatory changes, concerning for thrombophlebitis related to the port catheter.  With associated with low-density material in the right carotid space, possibly phlegmon, with prominent, hyperenhancing lymph nodes, favored to be reactive. Previously noted mass in the left tonsillar/tongue base region is no longer definitively seen, with only possible minimal asymmetry at the left on vast remaining. 2. A previously noted enlarged lymph node at left level 2 is difficult to visualize but likely measures up to 8 mm in short axis, previously 17 mm. 3.  For findings in the thorax, please see same day CT chest. Electronically Signed   By: Wiliam Ke M.D.   On: 11/18/2023 18:47    CODE STATUS:  Code Status History     Date Active Date Inactive Code Status Order ID Comments User Context   09/22/2023 1045 09/23/2023 0514 Full Code [id number]  Roanna Banning, MD [facility]   09/22/2023 1045 09/22/2023 1045 Full Code [id number]  Roanna Banning, MD [facility]    Questions for Most Recent Historical Code Status (Order [id number])     Question Answer   By: Consent: discussion documented in EHR             Future Appointments  Date Time Provider Department Center  12/27/2023 12:00 PM Noreene Larsson, RD [facility] None  01/04/2024 11:00 AM Barron Alvine, CCC-SLP [facility]  01/09/2024 10:00 AM Rennis Chris, MD [facility] None  02/21/2024  2:40 PM [name], MD [facility] None  02/24/2024  3:15 PM [facility] None  02/24/2024  3:40 PM Raylinn Kosar, [name], MD [facility] None  06/20/2024  8:40 AM [name], MD [facility]      This document was completed utilizing speech recognition software. Grammatical errors, random word insertions, pronoun errors, and incomplete sentences are an occasional consequence of this system due to software limitations, ambient noise, and hardware issues. Any formal questions or concerns about the content, text or information contained within the body of this dictation should be directly addressed to the provider for clarification.

## 2023-12-15 NOTE — Assessment & Plan Note (Addendum)
-  Underwent total colectomy in 2016. Currently experiencing watery bowel movements approximately seven times a day. -We will discuss referral to a genetic counselor on return visit, now that he has completed treatments for his head and neck cancer.

## 2023-12-15 NOTE — Assessment & Plan Note (Addendum)
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   - Previously discussed the benefits and risks of radiation alone vs combination of chemotherapy and radiation. Given the patient's age and the size of the lymph node which is borderline at 3 cm, the benefits of adding chemotherapy slightly outweigh the risks. After discussing side effect profile, patient did opt for weekly cisplatin.  Plan made to proceed with weekly cisplatin at 40 mg/m dose during the course of radiation with appropriate pre and post hydration and IV nausea medications.  -He started radiation treatments from 09/27/2023.  Started first dose of cisplatin on 09/29/2023.  -On the same day that he received first dose of cisplatin, he developed visual changes, described as a "3D" effect and double vision. Although very rare, optic neuritis has been reported with cisplatin use on literature review, especially when used in combination with other chemotherapeutic agents.  Though it is rare to happen with just 1 dose of cisplatin, given his symptoms, an ophthalmology referral was placed and we held Cisplatin on 10/05/23. Dr. Vanessa Barbara with ophthalmology evaluated patient on 10/06/2023.  No signs of optic neuritis. Patient was cleared to resume cisplatin.  - We resumed cisplatin from 10/14/2023 and continued this weekly during the course of radiation.  - He received total of 4 doses of cisplatin. He was hospitalized on 11/01/2023 for neutropenic fever, pain from mucositis.  ANC slowly improved.  Infectious workup was negative.  He was discharged home on 11/05/2023.  -Given issues with neutropenia and the fact that his mucositis was getting worse with chemotherapy, patient opted to not receive further chemotherapy. He completed radiation treatments on 11/16/2023.    -He is slowly recovering from treatment-related side effects.  Swallowing function has been improving.  No longer has mouth sores.

## 2023-12-16 ENCOUNTER — Encounter: Payer: Self-pay | Admitting: Oncology

## 2023-12-16 DIAGNOSIS — I82C11 Acute embolism and thrombosis of right internal jugular vein: Secondary | ICD-10-CM | POA: Insufficient documentation

## 2023-12-16 LAB — TSH: TSH: 1.209 u[IU]/mL (ref 0.350–4.500)

## 2023-12-16 NOTE — Addendum Note (Signed)
Addended by: [name] on: 12/16/2023 10:01 AM   Modules accepted: Orders

## 2023-12-16 NOTE — Assessment & Plan Note (Signed)
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 Currently off of Lovenox injections for a week, as he ran out. Plan to switch to Eliquis (apixaban) for continued anticoagulation therapy for at least two more months. Discussed risks of continued anticoagulation including bleeding and benefits of preventing clot progression.   A CT scan will be done in March to assess clot status and also his head and neck cancer status before considering port removal.  - Prescribed Eliquis 5 mg twice daily

## 2023-12-16 NOTE — Assessment & Plan Note (Signed)
Reports daily bleeding from hemorrhoids. Currently using prescribed cream with limited relief. Surgical intervention not possible while on blood thinners.  - Continue using prescribed hemorrhoid cream - Reassess hemorrhoid treatment options after discontinuation of blood thinners

## 2023-12-20 ENCOUNTER — Ambulatory Visit (HOSPITAL_COMMUNITY): Payer: BC Managed Care – PPO | Admitting: Licensed Clinical Social Worker

## 2023-12-27 ENCOUNTER — Inpatient Hospital Stay: Payer: BC Managed Care – PPO | Admitting: Dietician

## 2023-12-27 ENCOUNTER — Telehealth: Payer: Self-pay | Admitting: Dietician

## 2023-12-27 NOTE — Telephone Encounter (Signed)
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 Chemotherapy discontinued secondary to side effects. S/p PEG 10/24.   Spoke with patient via telephone. Patient reports doing well overall. His taste has returned for most foods besides sweets. He is able to taste sweet, however this is diminished. Patient has been eating orally some. He denies dysphagia/odynophagia. Patient does report mouth gets sore when eating. Yesterday he had a pork chop. Patient is relying on tube. He is giving 7 cartons Osmolite 1.5 which he tolerates well. Patient states he is trying to gain some of his weight back. He denies nausea, vomiting, diarrhea, constipation. Patient does have bleeding hemorrhoids. He is using medicated pads. He is unable to have surgical removal of hemorrhoid as he is currently on blood thinners.    Medications: reviewed  Labs: 1/16 - glucose 132, BUN 22  Anthropometrics: Wt 160 lb 6.4 oz on 1/16   Estimated Energy Needs  Kcals: 2450-2850 Protein: 122-139 Fluid: >/= 2.5 L  NUTRITION DIAGNOSIS: Inadequate oral intake continues - working to wean from feeding tube   INTERVENTION:  Ongoing encouragement to increase oral intake and wean from tube. Pt is agreeable to eat breakfast meal vs bolus 3x/week at minimum Encouraged 3 meals + 3 Osmolite as able  Suggested sitz bath for hemorrhoid relief  Recommend daily fiber supplement given no fiber in Osmolite     MONITORING, EVALUATION, GOAL: wt trends, intake, TF   NEXT VISIT: Tuesday February 18 via telephone

## 2023-12-29 ENCOUNTER — Encounter: Payer: Self-pay | Admitting: Oncology

## 2024-01-02 ENCOUNTER — Other Ambulatory Visit: Payer: Self-pay

## 2024-01-02 MED ORDER — RIVAROXABAN 20 MG PO TABS: 20.0000 mg | ORAL_TABLET | Freq: Every day | ORAL | 1 refills | Status: DC
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
## 2024-01-04 ENCOUNTER — Ambulatory Visit: Payer: BC Managed Care – PPO | Attending: Radiation Oncology

## 2024-01-04 DIAGNOSIS — R1310 Dysphagia, unspecified: Secondary | ICD-10-CM | POA: Insufficient documentation

## 2024-01-04 NOTE — Patient Instructions (Signed)
   Signs of Aspiration Pneumonia   Chest pain/tightness Fever (can be low grade) Cough  With foul-smelling phlegm (sputum) With sputum containing pus or blood With greenish sputum Fatigue  Shortness of breath  Wheezing   **IF YOU HAVE THESE SIGNS, CONTACT YOUR DOCTOR OR GO TO THE EMERGENCY DEPARTMENT OR URGENT CARE AS SOON AS POSSIBLE**

## 2024-01-04 NOTE — Therapy (Signed)
 OUTPATIENT SPEECH LANGUAGE PATHOLOGY ONCOLOGY EVALUATION   Patient Name: Harry Williams MRN: [id number] DOB:August 19, 1981, 43 y.o., male Today's Date: 01/04/2024  PCP: Alvia Mayo, MD REFERRING PROVIDER: [name], MD  END OF SESSION:  End of Session - 01/04/24 1106     Visit Number 3    Number of Visits 7    Date for SLP Re-Evaluation 01/23/24    Activity Tolerance Patient tolerated treatment well             Past Medical History:  Diagnosis Date   Anxiety    Asthma    Avascular necrosis of hip, left (HCC) 07/24/2021   Bright red blood per rectum 12/16/2022   Closed fracture of nasal bone with routine healing 12/24/2016   Depression    DVT (deep venous thrombosis) (HCC)    PTSD (post-traumatic stress disorder)    Past Surgical History:  Procedure Laterality Date   APPENDECTOMY     COLON SURGERY  03/30/2015   IR GASTROSTOMY TUBE MOD SED  09/22/2023   IR IMAGING GUIDED PORT INSERTION  09/22/2023   JOINT REPLACEMENT  2008   partial right elbow; right femoral graft   Total abdominal proctocolectomy, J-pouch, ileal pouch anal anastomosis  2016   Patient Active Problem List   Diagnosis Date Noted   Internal jugular (IJ) vein thromboembolism, acute, right (HCC) 12/16/2023   Oral mucositis due to radiation therapy 11/02/2023   Leukopenia due to antineoplastic chemotherapy (HCC) 10/26/2023   Cancer associated pain 10/26/2023   Visual disturbances 10/05/2023   Hiccups 10/05/2023   GERD without esophagitis 10/05/2023   Port-A-Cath in place 10/04/2023   Bleeding hemorrhoid 09/29/2023   Familial adenomatous polyposis 09/15/2023   Malignant neoplasm of tonsillar fossa (HCC) 09/14/2023   Submandibular swelling 06/16/2023   Rotator cuff impingement syndrome, left 07/02/2022   Insomnia 04/01/2022   Vasovagal syncope 02/11/2022   Cervical spondylosis 11/13/2021   Healthcare maintenance 09/04/2021   Anxiety and depression 07/24/2021   Other spondylosis with  radiculopathy, lumbar region 07/24/2021   Adenomatous polyp 04/04/2015    ONSET DATE: July 2024 (script  10/03/23)  REFERRING DIAG:  C09.0 (ICD-10-CM) - Malignant neoplasm of tonsillar fossa (HCC)    THERAPY DIAG:  Dysphagia, unspecified type  Rationale for Evaluation and Treatment: Rehabilitation  SUBJECTIVE:   SUBJECTIVE STATEMENT: Arrives stating he has done much better with HEP frequency since last visit.   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 He presented to his PCP July 2024 and was incidentally noted to have left submandibular swelling on routine examination. 07/07/23 neck US  which showed an ovoid mass measuring 2.8 x 1.9 cm, most consistent with an abnormally enlarged lymph node, and correlating with the palpable area in the left submandibular region. 07/20/23 CT neck completed which demonstrated: a  2.5 x 3 cm mass in the left tonsillar region with extension towards the tongue base, suspicious for carcinoma, and evidence of level 2 lymphadenopathy on the left, consisting of a dominant node measuring 3 x 2 x 1.7 cm. A benign appearing osteoma was also demonstrated, projecting inferior from the angle of the left mandible. He was referred to Dr. Denys at [facility] and underwent biopsies of the left tonsil 08/26/23. Pathology findings revealed findings consistent with HPV associated invasive SCC, p 16 +. 09/07/23 PET at [facility] showed intense radiotracer uptake within the left palatine tonsillar mass; adjacent radiotracer uptake  within the region of the right palatine tonsil; and hypermetabolic nodes in the left neck extending to the supraclavicular region concerning for nodal metastases. Focal uptake at the anorectal junction was also demonstrated, possibly reflecting muscular contraction, however local malignancy can not be excluded. PET otherwise showed no evidence of  more distant metastatic disease. Consult with Dr. Izell 10/16 & Consult with Dr. Autumn 10/17. Radiation/chemotherapy planned.Treatment plan:  He will receive 35 fractions of radiation to his left tonsil and bilateral neck which started on 10/29 and will complete 12/18.  PAIN:  Are you having pain? No  FALLS: Has patient fallen in last 6 months?  No  PATIENT GOALS: Maintain WNL swallowing  OBJECTIVE:  Note: Objective measures were completed at Evaluation unless otherwise noted.   TODAY'S TREATMENT:                                                                                                                                         DATE:   01/04/24: Chicken, potatoes, corn, grits, cereal, fish in the last week. Pt was making headway towards supplementing with tube instead of with food before he had some fish that was very very spicy, and now has gone more supplementing with food. SLP encouraged him to wean more from tube and get back where he was prior to having the fish. Harry Williams shares that Theodosia (dietician) had also encouraged him to do that. Today SLP had pt eat peanut butter crackers and drank some water, without any overt s/sx oral or pharyngeal deficits. With HEP, pt's procedure was WNL. He has been performing HEP as directed since last session. SLP provided pt with overt s/sx aspiration PNA and pt repeated back to SLP with independence. SLP and pt agree pt can be seen again in two months.  12/02/23: Pt req'd min A usually with HEP today, independent by session end. He req'd max A for rationale for HEP.  He has attempted grapes and water at home, with good pharyngeal clearance. No coughing/wet voice ID 'd during those POs. Today SLP provided pt with applesauce and water without any overt s/sx oral or pharyngeal deficits. SLP told pt he could safely eat these type of POs and encouraged him to cut his last PEG feeding in half to wake up slightly hungry and have grits, wet scrambled eggs/pureed  eggs, slurried biscuits and gravy - all with liquid wash, or a carnation instant breakfast/Ensure/Boost PO. SLP encouraged him to discuss matching calorie intake between tube feed and PO with Elvie at next dietician visit on 12/09/23. SLP told pt if he begins to lose weight to keep all tube feeds unchanged from current regimen and just have POs in addition to tube feeds.\ Pt demo'd understanding of details and rationale of a food journal to SLP.  10/25/23 (eval): Research states the risk for dysphagia increases due to radiation and/or chemotherapy treatment due to a variety of  factors, so SLP educated the pt about the possibility of reduced/limited ability for PO intake during rad tx. SLP also educated pt regarding possible changes to swallowing musculature after rad tx, and why adherence to dysphagia HEP provided today and PO consumption was necessary to inhibit muscle fibrosis following rad tx and to mitigate muscle disuse atrophy. SLP informed pt why this would be detrimental to their swallowing status and to their pulmonary health. Pt demonstrated understanding of these things to SLP. SLP encouraged pt to safely eat and drink as deep into their radiation/chemotherapy as possible to provide the best possible long-term swallowing outcome for pt.  SLP then developed an individualized HEP for pt involving oral and pharyngeal strengthening and ROM and pt was instructed how to perform these exercises, including SLP demonstration. After SLP demonstration, pt return demonstrated each exercise. SLP ensured pt performance was correct prior to educating pt on next exercise. Pt required min cues faded to modified independent to perform HEP. Pt was instructed to complete this program 5-7 days/week, at least 20 reps a day until 6 months after his or her last day of rad tx, and then x2 a week after that, indefinitely. Among other modifications for days when pt cannot functionally swallow, SLP also suggested pt to perform  only non-swallowing tasks on the handout/HEP, and if necessary to cycle through the swallowing portion so the full program of exercises can be completed instead of fatiguing on one of the swallowing exercises and being unable to perform the other swallowing exercises. SLP instructed that swallowing exercises should then be added back into the regimen as pt is able to do so. Secondly, pt was told that former patients have told SLP that during their course of radiation therapy, taking prescribed pain medication just prior to performing HEP (and eating/drinking) has proven helpful in completing HEP (and eating and drinking) more regularly when going through their course of radiation treatment.    PATIENT EDUCATION: Education details:  when to reduce frequency of HEP Person educated: Patient Education method: Explanation Education comprehension: verbalized understanding   ASSESSMENT:  CLINICAL IMPRESSION: Patient is a 43 y.o. M who was seen today for treatment of swallowing following completion of radiation/chemoradiation therapy. Today pt ate items from regular diet and drank thin liquids without overt s/s oral or pharyngeal difficulty. At this time pt swallowing is deemed WNL/WFL with these POs. There are no overt s/s aspiration PNA observed by SLP nor any reported by pt at this time. Data indicate that pt's swallow ability will likely decrease over the course of radiation/chemoradiation therapy and could very well decline over time following the conclusion of that therapy due to muscle disuse atrophy and/or muscle fibrosis. Pt will cont to need to be seen by SLP in order to assess safety of PO intake, assess the need for recommending any objective swallow assessment, and ensuring pt is correctly completing the individualized HEP.  OBJECTIVE IMPAIRMENTS: include dysphagia. These impairments are limiting patient from safety when swallowing. Factors affecting potential to achieve goals and functional  outcome are  none noted today . Patient will benefit from skilled SLP services to address above impairments and improve overall function.  REHAB POTENTIAL: Good   GOALS: Goals reviewed with patient? No SHORT TERM GOALS: Target: 3rd total session   1. Pt will complete HEP with modified independence in 2 sessions Baseline: 01/04/24 Goal status: Paritally met   2.  pt will tell SLP why pt is completing HEP with modified independence Baseline:  Goal status:  Met   3.  pt will describe 3 overt s/s aspiration PNA with modified independence Baseline:  Goal status: Met   4.  pt will tell SLP how a food journal could hasten return to a more normalized diet Baseline:  Goal status: Met     LONG TERM GOALS: Target: 7th total session   1.  pt will complete HEP with independence over two visits Baseline:  Goal status: INITIAL   2.  pt will describe how to modify HEP over time, and the timeline associated with reduction in HEP frequency with modified independence over two sessions Baseline:  Goal status: INITIAL     PLAN:   SLP FREQUENCY:  once approx every 4 weeks   SLP DURATION:  7 sessions   PLANNED INTERVENTIONS: Aspiration precaution training, Pharyngeal strengthening exercises, Diet toleration management , Trials of upgraded texture/liquids, SLP instruction and feedback, Compensatory strategies, and Patient/family education    [facility], CCC-SLP 01/04/2024, 11:06 AM

## 2024-01-09 ENCOUNTER — Encounter (INDEPENDENT_AMBULATORY_CARE_PROVIDER_SITE_OTHER): Payer: BC Managed Care – PPO | Admitting: Ophthalmology

## 2024-01-09 DIAGNOSIS — H543 Unqualified visual loss, both eyes: Secondary | ICD-10-CM

## 2024-01-09 DIAGNOSIS — H31002 Unspecified chorioretinal scars, left eye: Secondary | ICD-10-CM

## 2024-01-09 DIAGNOSIS — H3581 Retinal edema: Secondary | ICD-10-CM

## 2024-01-17 ENCOUNTER — Inpatient Hospital Stay: Payer: BC Managed Care – PPO | Attending: Oncology | Admitting: Dietician

## 2024-01-17 ENCOUNTER — Telehealth: Payer: Self-pay | Admitting: Dietician

## 2024-01-17 NOTE — Telephone Encounter (Signed)
Patient did not answer for scheduled nutrition follow-up. Left VM with request for return call. Contact information provided.

## 2024-01-18 ENCOUNTER — Telehealth: Payer: Self-pay | Admitting: Dietician

## 2024-01-18 ENCOUNTER — Inpatient Hospital Stay: Payer: BC Managed Care – PPO | Admitting: Dietician

## 2024-01-18 NOTE — Telephone Encounter (Signed)
 Attempted to contacting patient via telephone for nutrition follow-up. Patient did not answer. Unable to leave message. Will continue efforts to connect with patient as able.

## 2024-01-25 ENCOUNTER — Emergency Department (HOSPITAL_COMMUNITY)
Admission: EM | Admit: 2024-01-25 | Discharge: 2024-01-26 | Disposition: A | Discharge status: Home / Self Care | Payer: BC Managed Care – PPO | Attending: Emergency Medicine | Admitting: Emergency Medicine

## 2024-01-25 ENCOUNTER — Other Ambulatory Visit: Payer: Self-pay

## 2024-01-25 ENCOUNTER — Encounter (HOSPITAL_COMMUNITY): Payer: Self-pay

## 2024-01-25 DIAGNOSIS — J45909 Unspecified asthma, uncomplicated: Secondary | ICD-10-CM | POA: Diagnosis not present

## 2024-01-25 DIAGNOSIS — R1012 Left upper quadrant pain: Secondary | ICD-10-CM | POA: Insufficient documentation

## 2024-01-25 DIAGNOSIS — K5900 Constipation, unspecified: Secondary | ICD-10-CM | POA: Diagnosis not present

## 2024-01-25 DIAGNOSIS — R112 Nausea with vomiting, unspecified: Secondary | ICD-10-CM | POA: Diagnosis present

## 2024-01-25 DIAGNOSIS — Z7901 Long term (current) use of anticoagulants: Secondary | ICD-10-CM | POA: Insufficient documentation

## 2024-01-25 LAB — COMPREHENSIVE METABOLIC PANEL
ALT: 16 U/L (ref 0–44)
AST: 17 U/L (ref 15–41)
Albumin: 3.9 g/dL (ref 3.5–5.0)
Alkaline Phosphatase: 75 U/L (ref 38–126)
Anion gap: 14 (ref 5–15)
BUN: 10 mg/dL (ref 6–20)
CO2: 23 mmol/L (ref 22–32)
Calcium: 9.3 mg/dL (ref 8.9–10.3)
Chloride: 98 mmol/L (ref 98–111)
Creatinine, Ser: 1.1 mg/dL (ref 0.61–1.24)
GFR, Estimated: >60 mL/min (ref >60)
Glucose, Bld: 104 mg/dL — ABNORMAL HIGH (ref 70–99)
Potassium: 4.1 mmol/L (ref 3.5–5.1)
Sodium: 135 mmol/L (ref 135–145)
Total Bilirubin: 1 mg/dL (ref 0.0–1.2)
Total Protein: 7.3 g/dL (ref 6.5–8.1)

## 2024-01-25 LAB — CBC
HCT: 39.4 % (ref 39.0–52.0)
Hemoglobin: 13.1 g/dL (ref 13.0–17.0)
MCH: 31 pg (ref 26.0–34.0)
MCHC: 33.2 g/dL (ref 30.0–36.0)
MCV: 93.4 fL (ref 80.0–100.0)
Platelets: 168 10*3/uL (ref 150–400)
RBC: 4.22 MIL/uL (ref 4.22–5.81)
RDW: 12.9 % (ref 11.5–15.5)
WBC: 3.4 10*3/uL — ABNORMAL LOW (ref 4.0–10.5)
nRBC: 0 % (ref 0.0–0.2)

## 2024-01-25 LAB — LIPASE, BLOOD: Lipase: 27 U/L (ref 11–51)

## 2024-01-25 NOTE — ED Triage Notes (Signed)
 Abdominal pain all around feeding tube that started last night. C/o vomiting. Denies diarrhea. States he is having a hard time when having BM, last BM an hour ago, but "it's not right".

## 2024-01-26 ENCOUNTER — Emergency Department (HOSPITAL_COMMUNITY): Payer: BC Managed Care – PPO

## 2024-01-26 LAB — URINALYSIS, ROUTINE W REFLEX MICROSCOPIC
Bacteria, UA: NONE SEEN
Bilirubin Urine: NEGATIVE
Glucose, UA: NEGATIVE mg/dL
Hgb urine dipstick: NEGATIVE
Ketones, ur: 80 mg/dL — AB
Leukocytes,Ua: NEGATIVE
Nitrite: NEGATIVE
Protein, ur: 30 mg/dL — AB
Specific Gravity, Urine: 1.015 (ref 1.005–1.030)
pH: 5 (ref 5.0–8.0)

## 2024-01-26 MED ORDER — IOHEXOL 300 MG/ML  SOLN
100.0000 mL | Freq: Once | INTRAMUSCULAR | Status: AC | PRN
  Administered 2024-01-26: 100 mL via INTRAVENOUS

## 2024-01-26 MED ORDER — KETOROLAC TROMETHAMINE 30 MG/ML IJ SOLN
15.0000 mg | Freq: Once | INTRAMUSCULAR | Status: AC
  Administered 2024-01-26: 15 mg via INTRAVENOUS
  Filled 2024-01-26: qty 1

## 2024-01-26 MED ORDER — DICYCLOMINE HCL 20 MG PO TABS: 20.0000 mg | ORAL_TABLET | Freq: Two times a day (BID) | ORAL | 0 refills | Status: DC

## 2024-01-26 MED ORDER — ONDANSETRON 8 MG PO TBDP: ORAL_TABLET | ORAL | 0 refills | Status: DC

## 2024-01-26 MED ORDER — ONDANSETRON HCL 4 MG/2ML IJ SOLN
4.0000 mg | Freq: Once | INTRAMUSCULAR | Status: AC
  Administered 2024-01-26: 4 mg via INTRAVENOUS
  Filled 2024-01-26: qty 2

## 2024-01-26 NOTE — ED Provider Notes (Signed)
 [place] [facility] AT [facility] Provider Note   CSN: [id number] Arrival date & time: 01/25/24  2242     History  Chief Complaint  Patient presents with   Abdominal Pain    Harry Williams is a 43 y.o. male.  The history is provided by the patient.  Abdominal Pain Pain location:  LUQ Pain radiates to:  Does not radiate Pain severity:  Moderate Onset quality:  Gradual Duration:  1 day Progression:  Waxing and waning Chronicity:  New Context: not suspicious food intake and not trauma   Relieved by:  Nothing Worsened by:  Nothing Ineffective treatments:  None tried Associated symptoms: constipation, nausea and vomiting   Associated symptoms: no dysuria and no fever   Patient is s/p colectomy and presents with BM not right and emesis.  IS also concerned for pain near his GI tube.      Past Medical History:  Diagnosis Date   Anxiety    Asthma    Avascular necrosis of hip, left (HCC) 07/24/2021   Bright red blood per rectum 12/16/2022   Closed fracture of nasal bone with routine healing 12/24/2016   Depression    DVT (deep venous thrombosis) (HCC)    PTSD (post-traumatic stress disorder)      Home Medications Prior to Admission medications   Medication Sig Start Date End Date Taking? Authorizing Provider  dicyclomine (BENTYL) 20 MG tablet Take 1 tablet (20 mg total) by mouth 2 (two) times daily. 01/26/24  Yes Layliana Devins, MD  ondansetron (ZOFRAN-ODT) 8 MG disintegrating tablet 8mg  ODT q8hours prn nausea 01/26/24  Yes Hendrick Pavich, MD  apixaban (ELIQUIS) 5 MG TABS tablet Take 1 tablet (5 mg total) by mouth 2 (two) times daily. 12/15/23   Pasam, [name], MD  baclofen (LIORESAL) 10 MG tablet Take 1 tablet (10 mg total) by mouth 3 (three) times daily. 10/04/23   Shanon Ace, PA-C  celecoxib (CELEBREX) 200 MG capsule Place 1 capsule (200 mg total) into feeding tube daily as needed for moderate pain (pain score 4-6). 11/29/23   [name], MD  dexamethasone (DECADRON) 4 MG tablet Place 8 mg into feeding tube See admin instructions. 8 mg, per tube, once a day for three days- starting the day after Cisplatin chemotherapy Patient not taking: Reported on 12/13/2023    [provider]  fentaNYL (DURAGESIC) 50 MCG/HR Place 1 patch onto the skin every 3 (three) days. 11/29/23   Pasam, [name], MD  HYDROcodone-acetaminophen (HYCET) 7.5-325 mg/15 ml solution Take 10 mLs by mouth every 4 (four) hours as needed for moderate pain (pain score 4-6). 12/13/23   Pasam, [name], MD  lidocaine (XYLOCAINE) 2 % solution Patient: Mix 1part 2% viscous lidocaine, 1part H20. Swish & swallow 10mL of diluted mixture, before meals and at bedtime, up to QID Patient taking differently: 10 mLs See admin instructions. Mix 1 part 2% viscous lidocaine with 1 part water. Swish & swallow 10 ml's of diluted mixture up to 4 times a day. 10/10/23   [name], MD  Nutritional Supplements (FEEDING SUPPLEMENT, OSMOLITE 1.5 CAL,) LIQD 7 cartons Osmolite 1.5 split over four feeding daily. Flush tube with 60 ml water before and after each bolus. Provide additional 3 1/2 cups (830 ml) water/day to meet hydration needs. Provides 2485 kcal, 104.3 g protein, 1267 ml free water from formula (2577 ml total water with flushes). 1659 ml/day meets 100% RDI 10/18/23   [name], MD  omeprazole (PRILOSEC) 20 MG capsule  20 mg See admin instructions. 20 mg per tube once a day    [provider]  PARoxetine (PAXIL CR) 12.5 MG 24 hr tablet Take 1 tablet (12.5 mg total) by mouth daily. Patient not taking: Reported on 11/01/2023 08/30/23   Thresa Ross, MD  rivaroxaban (XARELTO) 20 MG TABS tablet Take 1 tablet (20 mg total) by mouth daily with supper. 01/02/24   Pasam, [name], MD  scopolamine (TRANSDERM-SCOP) 1 MG/3DAYS Place 1 patch (1.5 mg total) onto the skin every 3 (three) days. 10/31/23   [name], MD      Allergies    Patient has no known  allergies.    Review of Systems   Review of Systems  Constitutional:  Negative for fever.  Respiratory:  Negative for wheezing and stridor.   Gastrointestinal:  Positive for abdominal pain, constipation, nausea and vomiting.  Genitourinary:  Negative for dysuria.  All other systems reviewed and are negative.   Physical Exam Updated Vital Signs BP 110/67 (BP Location: Right Arm)   Pulse 64   Temp 98.4 F (36.9 C) (Oral)   Resp 16   Ht 5\' 8"  (1.727 m)   Wt 70.3 kg   SpO2 99%   BMI 23.57 kg/m  Physical Exam Vitals and nursing note reviewed.  Constitutional:      General: He is not in acute distress.    Appearance: He is well-developed. He is not diaphoretic.  HENT:     Head: Normocephalic and atraumatic.     Nose: Nose normal.  Eyes:     Conjunctiva/sclera: Conjunctivae normal.     Pupils: Pupils are equal, round, and reactive to light.  Cardiovascular:     Rate and Rhythm: Normal rate and regular rhythm.     Pulses: Normal pulses.     Heart sounds: Normal heart sounds.  Pulmonary:     Effort: Pulmonary effort is normal.     Breath sounds: Normal breath sounds. No wheezing or rales.  Abdominal:     General: Bowel sounds are normal.     Palpations: Abdomen is soft.     Tenderness: There is no abdominal tenderness. There is no guarding or rebound.  Musculoskeletal:        General: Normal range of motion.     Cervical back: Normal range of motion and neck supple.  Skin:    General: Skin is warm and dry.     Capillary Refill: Capillary refill takes less than 2 seconds.  Neurological:     General: No focal deficit present.     Mental Status: He is alert and oriented to person, place, and time.     Deep Tendon Reflexes: Reflexes normal.  Psychiatric:        Mood and Affect: Mood normal.        Behavior: Behavior normal.     ED Results / Procedures / Treatments   Labs (all labs ordered are listed, but only abnormal results are displayed) Results for orders placed  or performed during the hospital encounter of 01/25/24  Urinalysis, Routine w reflex microscopic -Urine, Clean Catch   Collection Time: 01/25/24  3:01 AM  Result Value Ref Range   Color, Urine YELLOW YELLOW   APPearance CLEAR CLEAR   Specific Gravity, Urine 1.015 1.005 - 1.030   pH 5.0 5.0 - 8.0   Glucose, UA NEGATIVE NEGATIVE mg/dL   Hgb urine dipstick NEGATIVE NEGATIVE   Bilirubin Urine NEGATIVE NEGATIVE   Ketones, ur 80 (A) NEGATIVE mg/dL  Protein, ur 30 (A) NEGATIVE mg/dL   Nitrite NEGATIVE NEGATIVE   Leukocytes,Ua NEGATIVE NEGATIVE   RBC / HPF 0-5 0 - 5 RBC/hpf   WBC, UA 0-5 0 - 5 WBC/hpf   Bacteria, UA NONE SEEN NONE SEEN   Squamous Epithelial / HPF 0-5 0 - 5 /HPF   Mucus PRESENT   Lipase, blood   Collection Time: 01/25/24 10:50 PM  Result Value Ref Range   Lipase 27 11 - 51 U/L  Comprehensive metabolic panel   Collection Time: 01/25/24 10:50 PM  Result Value Ref Range   Sodium 135 135 - 145 mmol/L   Potassium 4.1 3.5 - 5.1 mmol/L   Chloride 98 98 - 111 mmol/L   CO2 23 22 - 32 mmol/L   Glucose, Bld 104 (H) 70 - 99 mg/dL   BUN 10 6 - 20 mg/dL   Creatinine, Ser 7.82 0.61 - 1.24 mg/dL   Calcium 9.3 8.9 - 95.6 mg/dL   Total Protein 7.3 6.5 - 8.1 g/dL   Albumin 3.9 3.5 - 5.0 g/dL   AST 17 15 - 41 U/L   ALT 16 0 - 44 U/L   Alkaline Phosphatase 75 38 - 126 U/L   Total Bilirubin 1.0 0.0 - 1.2 mg/dL   GFR, Estimated >21 >30 mL/min   Anion gap 14 5 - 15  CBC   Collection Time: 01/25/24 10:50 PM  Result Value Ref Range   WBC 3.4 (L) 4.0 - 10.5 K/uL   RBC 4.22 4.22 - 5.81 MIL/uL   Hemoglobin 13.1 13.0 - 17.0 g/dL   HCT 86.5 78.4 - 69.6 %   MCV 93.4 80.0 - 100.0 fL   MCH 31.0 26.0 - 34.0 pg   MCHC 33.2 30.0 - 36.0 g/dL   RDW 29.5 28.4 - 13.2 %   Platelets 168 150 - 400 K/uL   nRBC 0.0 0.0 - 0.2 %   CT ABDOMEN PELVIS W CONTRAST Result Date: 01/26/2024 CLINICAL DATA:  Abdominal pain EXAM: CT ABDOMEN AND PELVIS WITH CONTRAST TECHNIQUE: Multidetector CT imaging of the  abdomen and pelvis was performed using the standard protocol following bolus administration of intravenous contrast. RADIATION DOSE REDUCTION: This exam was performed according to the departmental dose-optimization program which includes automated exposure control, adjustment of the mA and/or kV according to patient size and/or use of iterative reconstruction technique. CONTRAST:  OMNIPAQUE IOHEXOL 300 MG/ML  SOLN COMPARISON:  09/07/2023 FINDINGS: Lower chest: No acute abnormality. Hepatobiliary: No focal liver abnormality is seen. No gallstones, gallbladder wall thickening, or biliary dilatation. Pancreas: Unremarkable. No pancreatic ductal dilatation or surrounding inflammatory changes. Spleen: Normal in size without focal abnormality. Adrenals/Urinary Tract: Adrenal glands are within normal limits. Kidneys demonstrate a normal enhancement pattern bilaterally. No calculi or obstructive changes are seen. The bladder is decompressed. Stomach/Bowel: Gastrostomy catheter is noted in place. No inflammatory changes surrounding the catheter are seen. 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 Ileo anal anastomosis is seen. Some mildly prominent loops of small bowel are noted in the mid abdomen. These are noted just proximal to the ileoanal anastomosis and relatively similar to that seen on the prior exam. This likely represents some chronic dilatation proximal to the anastomosis. Vascular/Lymphatic: No significant vascular findings are present. No enlarged abdominal or pelvic lymph nodes. Reproductive: Prostate is unremarkable. Other: No abdominal wall hernia or abnormality. No abdominopelvic ascites. 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 IMPRESSION: Changes consistent with  prior colectomy with ileoanal anastomosis. Some dilated loops are noted proximal to this but chronic in nature. No other focal abnormality is noted.  Electronically Signed   By: Alcide Clever M.D.   On: 01/26/2024 01:20    CT ABDOMEN PELVIS W CONTRAST Result Date: 01/26/2024 CLINICAL DATA:  Abdominal pain EXAM: CT ABDOMEN AND PELVIS WITH CONTRAST TECHNIQUE: Multidetector CT imaging of the abdomen and pelvis was performed using the standard protocol following bolus administration of intravenous contrast. RADIATION DOSE REDUCTION: This exam was performed according to the departmental dose-optimization program which includes automated exposure control, adjustment of the mA and/or kV according to patient size and/or use of iterative reconstruction technique. CONTRAST:  OMNIPAQUE IOHEXOL 300 MG/ML  SOLN COMPARISON:  09/07/2023 FINDINGS: Lower chest: No acute abnormality. Hepatobiliary: No focal liver abnormality is seen. No gallstones, gallbladder wall thickening, or biliary dilatation. Pancreas: Unremarkable. No pancreatic ductal dilatation or surrounding inflammatory changes. Spleen: Normal in size without focal abnormality. Adrenals/Urinary Tract: Adrenal glands are within normal limits. Kidneys demonstrate a normal enhancement pattern bilaterally. No calculi or obstructive changes are seen. The bladder is decompressed. Stomach/Bowel: Gastrostomy catheter is noted in place. No inflammatory changes surrounding the catheter are seen. 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 Ileo anal anastomosis is seen. Some mildly prominent loops of small bowel are noted in the mid abdomen. These are noted just proximal to the ileoanal anastomosis and relatively similar to that seen on the prior exam. This likely represents some chronic dilatation proximal to the anastomosis. Vascular/Lymphatic: No significant vascular findings are present. No enlarged abdominal or pelvic lymph nodes. Reproductive: Prostate is unremarkable. Other: No abdominal wall hernia or abnormality. No abdominopelvic ascites. Musculoskeletal: Bilateral L5 pars defects are noted, stable in  appearance. 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 IMPRESSION: Changes consistent with prior colectomy with ileoanal anastomosis. Some dilated loops are noted proximal to this but chronic in nature. No other focal abnormality is noted. Electronically Signed   By: Alcide Clever M.D.   On: 01/26/2024 01:20    Procedures Procedures    Medications Ordered in ED Medications  iohexol (OMNIPAQUE) 300 MG/ML solution 100 mL (100 mLs Intravenous Contrast Given 01/26/24 0051)  ketorolac (TORADOL) 30 MG/ML injection 15 mg (15 mg Intravenous Given 01/26/24 0144)  ondansetron (ZOFRAN) injection 4 mg (4 mg Intravenous Given 01/26/24 0230)    ED Course/ Medical Decision Making/ A&P                                 Medical Decision Making Patient with nausea and vomiting and changes in bowel function   Amount and/or Complexity of Data Reviewed Independent Historian: spouse    Details: See above  External Data Reviewed: notes.    Details: Previous notes reviewed  Labs: ordered.    Details: Normal sodium 135, normal potassium 4.1, normal creatinine.  Normal LFTS. Normal lipase 27, Urine is without UTI.  White count 3.4 low. Hemoglobin normal  13.1  Radiology: ordered and independent interpretation performed.    Details: S/p colectomy   Risk Prescription drug management. Risk Details: Well appearing with normal vitals and exam.  No infection at GI tube site.  Labs are benign and reassuring.  CT is unchanged.  I suspect this is a viral illness.  Stable for discharge with close follow up.  Strict returns     Final Clinical Impression(s) / ED Diagnoses Final diagnoses:  Nausea and  vomiting, unspecified vomiting type    I have reviewed the triage vital signs and the nursing notes. Pertinent labs & imaging results that were available during my care of the patient were reviewed by me and considered in my medical decision making (see chart for details). After history, exam, and medical  workup I feel the patient has been appropriately medically screened and is safe for discharge home. Pertinent diagnoses were discussed with the patient. Patient was given return precautions.    Rx / DC Orders ED Discharge Orders          Ordered    ondansetron (ZOFRAN-ODT) 8 MG disintegrating tablet        01/26/24 0329    dicyclomine (BENTYL) 20 MG tablet  2 times daily        01/26/24 0329              Jamarian Jacinto, MD 01/26/24 [phone]

## 2024-01-30 ENCOUNTER — Emergency Department (HOSPITAL_COMMUNITY)

## 2024-01-30 ENCOUNTER — Other Ambulatory Visit: Payer: Self-pay

## 2024-01-30 ENCOUNTER — Emergency Department (HOSPITAL_BASED_OUTPATIENT_CLINIC_OR_DEPARTMENT_OTHER)
Admission: EM | Admit: 2024-01-30 | Discharge: 2024-01-30 | Discharge status: Against Medical Advice | Source: Home / Self Care

## 2024-01-30 ENCOUNTER — Encounter (HOSPITAL_COMMUNITY): Payer: Self-pay

## 2024-01-30 ENCOUNTER — Encounter (HOSPITAL_BASED_OUTPATIENT_CLINIC_OR_DEPARTMENT_OTHER): Payer: Self-pay

## 2024-01-30 ENCOUNTER — Inpatient Hospital Stay (HOSPITAL_COMMUNITY)
Admission: EM | Admit: 2024-01-30 | Discharge: 2024-02-14 | DRG: 329 | Disposition: A | Discharge status: Home / Self Care | Attending: Internal Medicine | Admitting: Internal Medicine

## 2024-01-30 DIAGNOSIS — E861 Hypovolemia: Secondary | ICD-10-CM | POA: Diagnosis present

## 2024-01-30 DIAGNOSIS — R000 Tachycardia, unspecified: Secondary | ICD-10-CM | POA: Diagnosis not present

## 2024-01-30 DIAGNOSIS — K460 Unspecified abdominal hernia with obstruction, without gangrene: Principal | ICD-10-CM | POA: Diagnosis present

## 2024-01-30 DIAGNOSIS — R739 Hyperglycemia, unspecified: Secondary | ICD-10-CM | POA: Diagnosis present

## 2024-01-30 DIAGNOSIS — Z86718 Personal history of other venous thrombosis and embolism: Secondary | ICD-10-CM

## 2024-01-30 DIAGNOSIS — K5900 Constipation, unspecified: Secondary | ICD-10-CM | POA: Insufficient documentation

## 2024-01-30 DIAGNOSIS — E875 Hyperkalemia: Secondary | ICD-10-CM | POA: Diagnosis not present

## 2024-01-30 DIAGNOSIS — Z833 Family history of diabetes mellitus: Secondary | ICD-10-CM

## 2024-01-30 DIAGNOSIS — I82C11 Acute embolism and thrombosis of right internal jugular vein: Secondary | ICD-10-CM | POA: Diagnosis present

## 2024-01-30 DIAGNOSIS — R112 Nausea with vomiting, unspecified: Secondary | ICD-10-CM | POA: Insufficient documentation

## 2024-01-30 DIAGNOSIS — R9431 Abnormal electrocardiogram [ECG] [EKG]: Secondary | ICD-10-CM | POA: Diagnosis present

## 2024-01-30 DIAGNOSIS — B9620 Unspecified Escherichia coli [E. coli] as the cause of diseases classified elsewhere: Secondary | ICD-10-CM | POA: Diagnosis not present

## 2024-01-30 DIAGNOSIS — K651 Peritoneal abscess: Secondary | ICD-10-CM | POA: Diagnosis not present

## 2024-01-30 DIAGNOSIS — Z825 Family history of asthma and other chronic lower respiratory diseases: Secondary | ICD-10-CM

## 2024-01-30 DIAGNOSIS — Z8249 Family history of ischemic heart disease and other diseases of the circulatory system: Secondary | ICD-10-CM

## 2024-01-30 DIAGNOSIS — Z431 Encounter for attention to gastrostomy: Secondary | ICD-10-CM

## 2024-01-30 DIAGNOSIS — C090 Malignant neoplasm of tonsillar fossa: Secondary | ICD-10-CM | POA: Diagnosis present

## 2024-01-30 DIAGNOSIS — Z9221 Personal history of antineoplastic chemotherapy: Secondary | ICD-10-CM

## 2024-01-30 DIAGNOSIS — K660 Peritoneal adhesions (postprocedural) (postinfection): Secondary | ICD-10-CM | POA: Diagnosis present

## 2024-01-30 DIAGNOSIS — Z7401 Bed confinement status: Secondary | ICD-10-CM

## 2024-01-30 DIAGNOSIS — K567 Ileus, unspecified: Secondary | ICD-10-CM | POA: Diagnosis not present

## 2024-01-30 DIAGNOSIS — Z923 Personal history of irradiation: Secondary | ICD-10-CM

## 2024-01-30 DIAGNOSIS — J45909 Unspecified asthma, uncomplicated: Secondary | ICD-10-CM | POA: Diagnosis present

## 2024-01-30 DIAGNOSIS — Z85818 Personal history of malignant neoplasm of other sites of lip, oral cavity, and pharynx: Secondary | ICD-10-CM

## 2024-01-30 DIAGNOSIS — K9189 Other postprocedural complications and disorders of digestive system: Secondary | ICD-10-CM | POA: Diagnosis not present

## 2024-01-30 DIAGNOSIS — E860 Dehydration: Secondary | ICD-10-CM | POA: Diagnosis present

## 2024-01-30 DIAGNOSIS — K56609 Unspecified intestinal obstruction, unspecified as to partial versus complete obstruction: Secondary | ICD-10-CM

## 2024-01-30 DIAGNOSIS — R300 Dysuria: Secondary | ICD-10-CM | POA: Diagnosis not present

## 2024-01-30 DIAGNOSIS — D649 Anemia, unspecified: Secondary | ICD-10-CM | POA: Diagnosis not present

## 2024-01-30 DIAGNOSIS — Z5321 Procedure and treatment not carried out due to patient leaving prior to being seen by health care provider: Secondary | ICD-10-CM | POA: Insufficient documentation

## 2024-01-30 DIAGNOSIS — Z8261 Family history of arthritis: Secondary | ICD-10-CM

## 2024-01-30 DIAGNOSIS — E878 Other disorders of electrolyte and fluid balance, not elsewhere classified: Secondary | ICD-10-CM

## 2024-01-30 DIAGNOSIS — E871 Hypo-osmolality and hyponatremia: Secondary | ICD-10-CM

## 2024-01-30 DIAGNOSIS — K1233 Oral mucositis (ulcerative) due to radiation: Secondary | ICD-10-CM | POA: Diagnosis present

## 2024-01-30 DIAGNOSIS — R188 Other ascites: Secondary | ICD-10-CM | POA: Diagnosis not present

## 2024-01-30 DIAGNOSIS — Y842 Radiological procedure and radiotherapy as the cause of abnormal reaction of the patient, or of later complication, without mention of misadventure at the time of the procedure: Secondary | ICD-10-CM | POA: Diagnosis present

## 2024-01-30 DIAGNOSIS — Y838 Other surgical procedures as the cause of abnormal reaction of the patient, or of later complication, without mention of misadventure at the time of the procedure: Secondary | ICD-10-CM | POA: Diagnosis not present

## 2024-01-30 DIAGNOSIS — R070 Pain in throat: Secondary | ICD-10-CM | POA: Diagnosis not present

## 2024-01-30 DIAGNOSIS — Z79899 Other long term (current) drug therapy: Secondary | ICD-10-CM

## 2024-01-30 DIAGNOSIS — T8149XA Infection following a procedure, other surgical site, initial encounter: Secondary | ICD-10-CM | POA: Diagnosis not present

## 2024-01-30 DIAGNOSIS — Z860101 Personal history of adenomatous and serrated colon polyps: Secondary | ICD-10-CM

## 2024-01-30 DIAGNOSIS — Z7901 Long term (current) use of anticoagulants: Secondary | ICD-10-CM

## 2024-01-30 DIAGNOSIS — N179 Acute kidney failure, unspecified: Secondary | ICD-10-CM

## 2024-01-30 LAB — COMPREHENSIVE METABOLIC PANEL
ALT: 9 U/L (ref 0–44)
ALT: 15 U/L (ref 0–44)
AST: 11 U/L — ABNORMAL LOW (ref 15–41)
AST: 16 U/L (ref 15–41)
Albumin: 4.9 g/dL (ref 3.5–5.0)
Albumin: 4.3 g/dL (ref 3.5–5.0)
Alkaline Phosphatase: 77 U/L (ref 38–126)
Alkaline Phosphatase: 73 U/L (ref 38–126)
Anion gap: 13 (ref 5–15)
Anion gap: 14 (ref 5–15)
BUN: 28 mg/dL — ABNORMAL HIGH (ref 6–20)
BUN: 30 mg/dL — ABNORMAL HIGH (ref 6–20)
CO2: 33 mmol/L — ABNORMAL HIGH (ref 22–32)
CO2: 29 mmol/L (ref 22–32)
Calcium: 10.3 mg/dL (ref 8.9–10.3)
Calcium: 9.5 mg/dL (ref 8.9–10.3)
Chloride: 85 mmol/L — ABNORMAL LOW (ref 98–111)
Chloride: 85 mmol/L — ABNORMAL LOW (ref 98–111)
Creatinine, Ser: 1.74 mg/dL — ABNORMAL HIGH (ref 0.61–1.24)
Creatinine, Ser: 1.79 mg/dL — ABNORMAL HIGH (ref 0.61–1.24)
GFR, Estimated: 50 mL/min — ABNORMAL LOW (ref >60)
GFR, Estimated: 48 mL/min — ABNORMAL LOW (ref >60)
Glucose, Bld: 163 mg/dL — ABNORMAL HIGH (ref 70–99)
Glucose, Bld: 147 mg/dL — ABNORMAL HIGH (ref 70–99)
Potassium: 5.7 mmol/L — ABNORMAL HIGH (ref 3.5–5.1)
Potassium: 5.7 mmol/L — ABNORMAL HIGH (ref 3.5–5.1)
Sodium: 131 mmol/L — ABNORMAL LOW (ref 135–145)
Sodium: 128 mmol/L — ABNORMAL LOW (ref 135–145)
Total Bilirubin: 0.8 mg/dL (ref 0.0–1.2)
Total Bilirubin: 1 mg/dL (ref 0.0–1.2)
Total Protein: 8.3 g/dL — ABNORMAL HIGH (ref 6.5–8.1)
Total Protein: 8.6 g/dL — ABNORMAL HIGH (ref 6.5–8.1)

## 2024-01-30 LAB — CBC WITH DIFFERENTIAL/PLATELET
Abs Immature Granulocytes: 0.02 10*3/uL (ref 0.00–0.07)
Basophils Absolute: 0 10*3/uL (ref 0.0–0.1)
Basophils Relative: 1 %
Eosinophils Absolute: 0 10*3/uL (ref 0.0–0.5)
Eosinophils Relative: 0 %
HCT: 45.5 % (ref 39.0–52.0)
Hemoglobin: 15.5 g/dL (ref 13.0–17.0)
Immature Granulocytes: 0 %
Lymphocytes Relative: 5 %
Lymphs Abs: 0.3 10*3/uL — ABNORMAL LOW (ref 0.7–4.0)
MCH: 30.6 pg (ref 26.0–34.0)
MCHC: 34.1 g/dL (ref 30.0–36.0)
MCV: 89.9 fL (ref 80.0–100.0)
Monocytes Absolute: 1 10*3/uL (ref 0.1–1.0)
Monocytes Relative: 19 %
Neutro Abs: 4.1 10*3/uL (ref 1.7–7.7)
Neutrophils Relative %: 75 %
Platelets: 336 10*3/uL (ref 150–400)
RBC: 5.06 MIL/uL (ref 4.22–5.81)
RDW: 12.8 % (ref 11.5–15.5)
WBC: 5.5 10*3/uL (ref 4.0–10.5)
nRBC: 0 % (ref 0.0–0.2)

## 2024-01-30 LAB — CBC
HCT: 45.5 % (ref 39.0–52.0)
Hemoglobin: 15.8 g/dL (ref 13.0–17.0)
MCH: 30.5 pg (ref 26.0–34.0)
MCHC: 34.7 g/dL (ref 30.0–36.0)
MCV: 87.8 fL (ref 80.0–100.0)
Platelets: 328 10*3/uL (ref 150–400)
RBC: 5.18 MIL/uL (ref 4.22–5.81)
RDW: 12.5 % (ref 11.5–15.5)
WBC: 5.6 10*3/uL (ref 4.0–10.5)
nRBC: 0 % (ref 0.0–0.2)

## 2024-01-30 LAB — LIPASE, BLOOD
Lipase: 20 U/L (ref 11–51)
Lipase: 27 U/L (ref 11–51)

## 2024-01-30 MED ORDER — CALCIUM GLUCONATE-NACL 1-0.675 GM/50ML-% IV SOLN
1.0000 g | Freq: Once | INTRAVENOUS | Status: AC
  Administered 2024-01-31: 1000 mg via INTRAVENOUS
  Filled 2024-01-30: qty 50

## 2024-01-30 MED ORDER — INSULIN ASPART 100 UNIT/ML IV SOLN
10.0000 [IU] | Freq: Once | INTRAVENOUS | Status: AC
  Administered 2024-01-31: 10 [IU] via INTRAVENOUS
  Filled 2024-01-30: qty 0.1

## 2024-01-30 MED ORDER — LACTATED RINGERS IV BOLUS
1000.0000 mL | Freq: Once | INTRAVENOUS | Status: AC
  Administered 2024-01-30: 1000 mL via INTRAVENOUS

## 2024-01-30 MED ORDER — SODIUM BICARBONATE 8.4 % IV SOLN
50.0000 meq | Freq: Once | INTRAVENOUS | Status: AC
  Administered 2024-01-31: 50 meq via INTRAVENOUS
  Filled 2024-01-30: qty 50

## 2024-01-30 MED ORDER — IOHEXOL 300 MG/ML  SOLN
100.0000 mL | Freq: Once | INTRAMUSCULAR | Status: AC | PRN
  Administered 2024-01-30: 100 mL via INTRAVENOUS

## 2024-01-30 MED ORDER — DEXTROSE 50 % IV SOLN
1.0000 | Freq: Once | INTRAVENOUS | Status: AC
  Administered 2024-01-31: 50 mL via INTRAVENOUS
  Filled 2024-01-30: qty 50

## 2024-01-30 NOTE — ED Provider Notes (Signed)
 [place] EMERGENCY DEPARTMENT AT [facility] Provider Note   CSN: [id number] Arrival date & time: 01/30/24  1715     History  Chief Complaint  Patient presents with   Emesis   Abdominal Pain    Harry Williams is a 43 y.o. male.  HPI    43 year old male comes in with chief complaint of vomiting, abdominal pain.  Patient has previous history of colectomy and J-pouch placement at [facility] because of multiple polyps in his colon.  He also has history of throat cancer, for which he finished his given radiation sometime in November.  Patient also has known history of clot close to his port, which for which she is on Xarelto = but he has not taken that medication in several days.  Patient states that he has not been able to keep anything down over the last week.  In the last 5 days, he has not had a bowel movement and is not passing flatus.  Patient is able to swallow, just not able to keep anything down.  He has had abdominal discomfort, but mostly that is postprandial.  Home Medications Prior to Admission medications   Medication Sig Start Date End Date Taking? Authorizing Provider  apixaban (ELIQUIS) 5 MG TABS tablet Take 1 tablet (5 mg total) by mouth 2 (two) times daily. 12/15/23   Pasam, [name], MD  baclofen (LIORESAL) 10 MG tablet Take 1 tablet (10 mg total) by mouth 3 (three) times daily. 10/04/23   Shanon Ace, PA-C  celecoxib (CELEBREX) 200 MG capsule Place 1 capsule (200 mg total) into feeding tube daily as needed for moderate pain (pain score 4-6). 11/29/23   [name], MD  dexamethasone (DECADRON) 4 MG tablet Place 8 mg into feeding tube See admin instructions. 8 mg, per tube, once a day for three days- starting the day after Cisplatin chemotherapy Patient not taking: Reported on 12/13/2023    [provider]  dicyclomine (BENTYL) 20 MG tablet Take 1 tablet (20 mg total) by mouth 2 (two) times daily. 01/26/24   Palumbo, April, MD  fentaNYL  (DURAGESIC) 50 MCG/HR Place 1 patch onto the skin every 3 (three) days. 11/29/23   Pasam, [name], MD  HYDROcodone-acetaminophen (HYCET) 7.5-325 mg/15 ml solution Take 10 mLs by mouth every 4 (four) hours as needed for moderate pain (pain score 4-6). 12/13/23   Pasam, [name], MD  lidocaine (XYLOCAINE) 2 % solution Patient: Mix 1part 2% viscous lidocaine, 1part H20. Swish & swallow 10mL of diluted mixture, before meals and at bedtime, up to QID Patient taking differently: 10 mLs See admin instructions. Mix 1 part 2% viscous lidocaine with 1 part water. Swish & swallow 10 ml's of diluted mixture up to 4 times a day. 10/10/23   [name], MD  Nutritional Supplements (FEEDING SUPPLEMENT, OSMOLITE 1.5 CAL,) LIQD 7 cartons Osmolite 1.5 split over four feeding daily. Flush tube with 60 ml water before and after each bolus. Provide additional 3 1/2 cups (830 ml) water/day to meet hydration needs. Provides 2485 kcal, 104.3 g protein, 1267 ml free water from formula (2577 ml total water with flushes). 1659 ml/day meets 100% RDI 10/18/23   [name], MD  omeprazole (PRILOSEC) 20 MG capsule 20 mg See admin instructions. 20 mg per tube once a day    [provider]  ondansetron (ZOFRAN-ODT) 8 MG disintegrating tablet 8mg  ODT q8hours prn nausea 01/26/24   Palumbo, April, MD  PARoxetine (PAXIL CR) 12.5 MG 24 hr tablet Take  1 tablet (12.5 mg total) by mouth daily. Patient not taking: Reported on 11/01/2023 08/30/23   Thresa Ross, MD  rivaroxaban (XARELTO) 20 MG TABS tablet Take 1 tablet (20 mg total) by mouth daily with supper. 01/02/24   Pasam, [name], MD  scopolamine (TRANSDERM-SCOP) 1 MG/3DAYS Place 1 patch (1.5 mg total) onto the skin every 3 (three) days. 10/31/23   [name], MD      Allergies    Patient has no known allergies.    Review of Systems   Review of Systems  All other systems reviewed and are negative.   Physical Exam Updated Vital Signs BP 121/84   Pulse 95   Temp  98.2 F (36.8 C) (Oral)   Resp 12   Ht 5\' 8"  (1.727 m)   Wt 70.3 kg   SpO2 95%   BMI 23.57 kg/m  Physical Exam Vitals and nursing note reviewed.  Constitutional:      Appearance: He is well-developed.  HENT:     Head: Atraumatic.  Cardiovascular:     Rate and Rhythm: Normal rate.  Pulmonary:     Effort: Pulmonary effort is normal.  Abdominal:     General: Abdomen is flat.     Tenderness: There is generalized abdominal tenderness. There is no guarding or rebound.     Hernia: No hernia is present.  Musculoskeletal:     Cervical back: Neck supple.  Skin:    General: Skin is warm.  Neurological:     Mental Status: He is alert and oriented to person, place, and time.     ED Results / Procedures / Treatments   Labs (all labs ordered are listed, but only abnormal results are displayed) Labs Reviewed  CBC WITH DIFFERENTIAL/PLATELET - Abnormal; Notable for the following components:      Result Value   Lymphs Abs 0.3 (*)    All other components within normal limits  COMPREHENSIVE METABOLIC PANEL - Abnormal; Notable for the following components:   Sodium 128 (*)    Potassium 5.7 (*)    Chloride 85 (*)    Glucose, Bld 147 (*)    BUN 30 (*)    Creatinine, Ser 1.79 (*)    Total Protein 8.6 (*)    GFR, Estimated 48 (*)    All other components within normal limits  LIPASE, BLOOD  URINALYSIS, ROUTINE W REFLEX MICROSCOPIC    EKG None  Radiology CT ABDOMEN PELVIS W CONTRAST Result Date: 01/30/2024 CLINICAL DATA:  Abdominal pain.  Bowel obstruction suspected. EXAM: CT ABDOMEN AND PELVIS WITH CONTRAST TECHNIQUE: Multidetector CT imaging of the abdomen and pelvis was performed using the standard protocol following bolus administration of intravenous contrast. RADIATION DOSE REDUCTION: This exam was performed according to the departmental dose-optimization program which includes automated exposure control, adjustment of the mA and/or kV according to patient size and/or use of  iterative reconstruction technique. CONTRAST:  OMNIPAQUE IOHEXOL 300 MG/ML  SOLN COMPARISON:  CT 01/26/2024 FINDINGS: Lower chest: No acute abnormality. Hepatobiliary: No acute abnormality. Pancreas: Unremarkable. Spleen: Unremarkable. Adrenals/Urinary Tract: Within normal limits. Stomach/Bowel: Percutaneous gastrostomy tube in unchanged position. Postoperative changes of colectomy with ileoanal anastomosis. Diffuse dilation of the small bowel. There is an abrupt transition point in the right central abdomen (series 8/image [id number]). There is an adjacent transition point downstream from the original transition point with dilation of the small bowel between the transition points (series 8/image 56). Findings are compatible with closed loop obstruction. No signs of ischemic bowel. Vascular/Lymphatic: No  significant vascular findings are present. No enlarged abdominal or pelvic lymph nodes. Reproductive: Unremarkable. Other: Small volume free fluid in the pelvis. No free intraperitoneal air. Musculoskeletal: No acute fracture. 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 IMPRESSION: High-grade small bowel closed loop obstruction in the right hemiabdomen. No evidence of ischemia. Critical Value/emergent results were called by telephone at the time of interpretation on 01/30/2024 at 11:10 pm to provider Riki Sheer , who verbally acknowledged these results. Electronically Signed   By: Minerva Fester M.D.   On: 01/30/2024 23:11    Procedures .Critical Care  Performed by: Derwood Kaplan, MD Authorized by: Derwood Kaplan, MD   Critical care provider statement:    Critical care time (minutes):  36   Critical care was necessary to treat or prevent imminent or life-threatening deterioration of the following conditions: Closed-loop bowel obstruction, hyperkalemia.   Critical care was time spent personally by me on the following activities:  Development of treatment plan with patient or surrogate,  discussions with consultants, evaluation of patient's response to treatment, examination of patient, ordering and review of laboratory studies, ordering and review of radiographic studies, ordering and performing treatments and interventions, pulse oximetry, re-evaluation of patient's condition, review of old charts and obtaining history from patient or surrogate     Medications Ordered in ED Medications  insulin aspart (novoLOG) injection 10 Units (has no administration in time range)    And  dextrose 50 % solution 50 mL (has no administration in time range)  sodium bicarbonate injection 50 mEq (has no administration in time range)  calcium gluconate 1 g/ 50 mL sodium chloride IVPB (has no administration in time range)  lactated ringers bolus 1,000 mL (1,000 mLs Intravenous New Bag/Given 01/30/24 2001)  iohexol (OMNIPAQUE) 300 MG/ML solution 100 mL (100 mLs Intravenous Contrast Given 01/30/24 2018)    ED Course/ Medical Decision Making/ A&P                                 Medical Decision Making Risk OTC drugs. Prescription drug management. Decision regarding hospitalization.   This patient presents to the ED with chief complaint(s) of abdominal distention, nausea, vomiting and patient has not had a bowel movement in 5 days, not passing flatus. With pertinent past medical history of previous colectomy, J-pouch placement, he also has G-tube in place, and throat cancer.  .The complaint involves an extensive differential diagnosis and also carries with it a high risk of complications and morbidity.    The differential diagnosis includes : Small bowel obstruction, ileus, tumor, gastroenteritis.  The initial plan is to get basic labs, CT abdomen pelvis with contrast.   Additional history obtained: Additional history obtained from spouse Records reviewed  previous CT scan, including CT scan just a few days ago.  At that time there was no evidence of obstruction.  Independent labs  interpretation:  The following labs were independently interpreted: CBC and metabolic profile is reassuring.  Patient does have mild hyperkalemia.  Independent visualization and interpretation of imaging: - I independently visualized the following imaging with scope of interpretation limited to determining acute life threatening conditions related to emergency care: CT abdomen pelvis, which revealed dilated bowel.  Per radiologist, there is closed-loop bowel obstruction.  Treatment and Reassessment: Results of the ED workup discussed with the patient. Discussed case with Dr. Donell Beers, she recommends medicine admission.  Patient has a G-tube in place, therefore she request that we can  place suction to G-tube.  Hyperkalemia medications as ordered.   Final Clinical Impression(s) / ED Diagnoses Final diagnoses:  Acute hyperkalemia  SBO (small bowel obstruction) (HCC)    Rx / DC Orders ED Discharge Orders     None         Derwood Kaplan, MD 01/30/24 2358

## 2024-01-30 NOTE — ED Triage Notes (Signed)
 Pt states "my intestines haven't been acting right for a week. Haven't been able to poop, fart, burp or swallow." Endorses NV, states he is unable to tolerate PO r/t NV. Reports "soreness" in abdomen, no distinct pain on palpation. Last BM approx 5 days ago,

## 2024-01-30 NOTE — ED Provider Triage Note (Signed)
 Emergency Medicine Provider Triage Evaluation Note  Harry Williams , a 43 y.o. male  was evaluated in triage.  Pt complains of abdominal pain.  States for the past 5 days he has not been on to pass gas or have a bowel movement.  Abdominal pain started about 5 days ago as well.  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  Endorses nausea and vomiting.  Denies fever.  Review of Systems  Positive: See above Negative: See above  Physical Exam  BP (!) 127/100 (BP Location: Left Arm)   Pulse 99   Temp 97.8 F (36.6 C) (Oral)   Resp 16   Ht 5\' 8"  (1.727 m)   Wt 70.3 kg   SpO2 97%   BMI 23.57 kg/m  Gen:   Awake, no distress   Resp:  Normal effort  MSK:   Moves extremities without difficulty  Other:    Medical Decision Making  Medically screening exam initiated at 5:39 PM.  Appropriate orders placed.  Harry Williams was informed that the remainder of the evaluation will be completed by another provider, this initial triage assessment does not replace that evaluation, and the importance of remaining in the ED until their evaluation is complete.  Work up started   Gareth Eagle, PA-C 01/30/24 1740

## 2024-01-30 NOTE — ED Triage Notes (Signed)
 Pt BIB family for 5 days of not passing gas or having a stool. Pt cancer md wants to rule out SBO. Pt has been unable to keep anything down. H/x tonsillar CA.

## 2024-01-31 ENCOUNTER — Inpatient Hospital Stay (HOSPITAL_COMMUNITY)

## 2024-01-31 DIAGNOSIS — K660 Peritoneal adhesions (postprocedural) (postinfection): Secondary | ICD-10-CM | POA: Diagnosis present

## 2024-01-31 DIAGNOSIS — N179 Acute kidney failure, unspecified: Secondary | ICD-10-CM | POA: Diagnosis present

## 2024-01-31 DIAGNOSIS — E871 Hypo-osmolality and hyponatremia: Secondary | ICD-10-CM | POA: Diagnosis present

## 2024-01-31 DIAGNOSIS — E875 Hyperkalemia: Secondary | ICD-10-CM

## 2024-01-31 DIAGNOSIS — E878 Other disorders of electrolyte and fluid balance, not elsewhere classified: Secondary | ICD-10-CM

## 2024-01-31 DIAGNOSIS — K9189 Other postprocedural complications and disorders of digestive system: Secondary | ICD-10-CM | POA: Diagnosis not present

## 2024-01-31 DIAGNOSIS — Z825 Family history of asthma and other chronic lower respiratory diseases: Secondary | ICD-10-CM | POA: Diagnosis not present

## 2024-01-31 DIAGNOSIS — J45909 Unspecified asthma, uncomplicated: Secondary | ICD-10-CM | POA: Diagnosis present

## 2024-01-31 DIAGNOSIS — Z8249 Family history of ischemic heart disease and other diseases of the circulatory system: Secondary | ICD-10-CM | POA: Diagnosis not present

## 2024-01-31 DIAGNOSIS — K567 Ileus, unspecified: Secondary | ICD-10-CM | POA: Diagnosis not present

## 2024-01-31 DIAGNOSIS — Z833 Family history of diabetes mellitus: Secondary | ICD-10-CM | POA: Diagnosis not present

## 2024-01-31 DIAGNOSIS — C090 Malignant neoplasm of tonsillar fossa: Secondary | ICD-10-CM | POA: Diagnosis not present

## 2024-01-31 DIAGNOSIS — Z86718 Personal history of other venous thrombosis and embolism: Secondary | ICD-10-CM | POA: Diagnosis not present

## 2024-01-31 DIAGNOSIS — Z431 Encounter for attention to gastrostomy: Secondary | ICD-10-CM | POA: Diagnosis not present

## 2024-01-31 DIAGNOSIS — Y842 Radiological procedure and radiotherapy as the cause of abnormal reaction of the patient, or of later complication, without mention of misadventure at the time of the procedure: Secondary | ICD-10-CM | POA: Diagnosis present

## 2024-01-31 DIAGNOSIS — K56609 Unspecified intestinal obstruction, unspecified as to partial versus complete obstruction: Secondary | ICD-10-CM

## 2024-01-31 DIAGNOSIS — Z8261 Family history of arthritis: Secondary | ICD-10-CM | POA: Diagnosis not present

## 2024-01-31 DIAGNOSIS — R188 Other ascites: Secondary | ICD-10-CM | POA: Diagnosis not present

## 2024-01-31 DIAGNOSIS — Y838 Other surgical procedures as the cause of abnormal reaction of the patient, or of later complication, without mention of misadventure at the time of the procedure: Secondary | ICD-10-CM | POA: Diagnosis not present

## 2024-01-31 DIAGNOSIS — E861 Hypovolemia: Secondary | ICD-10-CM | POA: Diagnosis present

## 2024-01-31 DIAGNOSIS — E860 Dehydration: Secondary | ICD-10-CM | POA: Diagnosis present

## 2024-01-31 DIAGNOSIS — K460 Unspecified abdominal hernia with obstruction, without gangrene: Secondary | ICD-10-CM | POA: Diagnosis present

## 2024-01-31 DIAGNOSIS — Z923 Personal history of irradiation: Secondary | ICD-10-CM | POA: Diagnosis not present

## 2024-01-31 DIAGNOSIS — K651 Peritoneal abscess: Secondary | ICD-10-CM | POA: Diagnosis not present

## 2024-01-31 DIAGNOSIS — I82C11 Acute embolism and thrombosis of right internal jugular vein: Secondary | ICD-10-CM | POA: Diagnosis present

## 2024-01-31 DIAGNOSIS — Z7901 Long term (current) use of anticoagulants: Secondary | ICD-10-CM | POA: Diagnosis not present

## 2024-01-31 DIAGNOSIS — Z9221 Personal history of antineoplastic chemotherapy: Secondary | ICD-10-CM | POA: Diagnosis not present

## 2024-01-31 DIAGNOSIS — T8149XA Infection following a procedure, other surgical site, initial encounter: Secondary | ICD-10-CM | POA: Diagnosis not present

## 2024-01-31 HISTORY — DX: Acute kidney failure, unspecified: N17.9

## 2024-01-31 LAB — URINALYSIS, ROUTINE W REFLEX MICROSCOPIC
Bilirubin Urine: NEGATIVE
Glucose, UA: 150 mg/dL — AB
Hgb urine dipstick: NEGATIVE
Ketones, ur: 5 mg/dL — AB
Leukocytes,Ua: NEGATIVE
Nitrite: NEGATIVE
Protein, ur: NEGATIVE mg/dL
Specific Gravity, Urine: >1.046 — ABNORMAL HIGH (ref 1.005–1.030)
pH: 5 (ref 5.0–8.0)

## 2024-01-31 LAB — CBC
HCT: 41.1 % (ref 39.0–52.0)
Hemoglobin: 14 g/dL (ref 13.0–17.0)
MCH: 30.7 pg (ref 26.0–34.0)
MCHC: 34.1 g/dL (ref 30.0–36.0)
MCV: 90.1 fL (ref 80.0–100.0)
Platelets: 301 10*3/uL (ref 150–400)
RBC: 4.56 MIL/uL (ref 4.22–5.81)
RDW: 12.8 % (ref 11.5–15.5)
WBC: 5 10*3/uL (ref 4.0–10.5)
nRBC: 0 % (ref 0.0–0.2)

## 2024-01-31 LAB — HEPARIN LEVEL (UNFRACTIONATED)
Heparin Unfractionated: <0.1 [IU]/mL — ABNORMAL LOW (ref 0.30–0.70)
Heparin Unfractionated: 0.54 [IU]/mL (ref 0.30–0.70)
Heparin Unfractionated: 0.39 [IU]/mL (ref 0.30–0.70)

## 2024-01-31 LAB — PROTIME-INR
INR: 1.1 (ref 0.8–1.2)
Prothrombin Time: 14.1 s (ref 11.4–15.2)

## 2024-01-31 LAB — BASIC METABOLIC PANEL
Anion gap: 15 (ref 5–15)
BUN: 33 mg/dL — ABNORMAL HIGH (ref 6–20)
CO2: 32 mmol/L (ref 22–32)
Calcium: 9.5 mg/dL (ref 8.9–10.3)
Chloride: 85 mmol/L — ABNORMAL LOW (ref 98–111)
Creatinine, Ser: 1.58 mg/dL — ABNORMAL HIGH (ref 0.61–1.24)
GFR, Estimated: 56 mL/min — ABNORMAL LOW (ref >60)
Glucose, Bld: 115 mg/dL — ABNORMAL HIGH (ref 70–99)
Potassium: 4.4 mmol/L (ref 3.5–5.1)
Sodium: 132 mmol/L — ABNORMAL LOW (ref 135–145)

## 2024-01-31 LAB — TROPONIN I (HIGH SENSITIVITY): Troponin I (High Sensitivity): 4 ng/L (ref <18)

## 2024-01-31 LAB — APTT: aPTT: 25 s (ref 24–36)

## 2024-01-31 LAB — CBG MONITORING, ED: Glucose-Capillary: 194 mg/dL — ABNORMAL HIGH (ref 70–99)

## 2024-01-31 MED ORDER — HEPARIN (PORCINE) 25000 UT/250ML-% IV SOLN
1200.0000 [IU]/h | INTRAVENOUS | Status: DC
  Administered 2024-01-31: 1150 [IU]/h via INTRAVENOUS
  Administered 2024-02-01: 1200 [IU]/h via INTRAVENOUS
  Filled 2024-01-31 (×2): qty 250

## 2024-01-31 MED ORDER — ACETAMINOPHEN 650 MG RE SUPP: 650.0000 mg | Freq: Four times a day (QID) | RECTAL | Status: DC | PRN

## 2024-01-31 MED ORDER — SODIUM CHLORIDE 0.9 % IV SOLN: INTRAVENOUS | Status: DC

## 2024-01-31 MED ORDER — ALBUTEROL SULFATE (2.5 MG/3ML) 0.083% IN NEBU: 2.5000 mg | INHALATION_SOLUTION | Freq: Four times a day (QID) | RESPIRATORY_TRACT | Status: DC | PRN

## 2024-01-31 MED ORDER — NALOXONE HCL 0.4 MG/ML IJ SOLN: 0.4000 mg | INTRAMUSCULAR | Status: DC | PRN

## 2024-01-31 MED ORDER — ACETAMINOPHEN 325 MG PO TABS: 650.0000 mg | ORAL_TABLET | Freq: Four times a day (QID) | ORAL | Status: DC | PRN

## 2024-01-31 MED ORDER — FENTANYL CITRATE PF 50 MCG/ML IJ SOSY
12.5000 ug | PREFILLED_SYRINGE | INTRAMUSCULAR | Status: DC | PRN
  Administered 2024-02-08 – 2024-02-09 (×2): 12.5 ug via INTRAVENOUS
  Filled 2024-01-31 (×2): qty 1

## 2024-01-31 MED ORDER — ONDANSETRON HCL 4 MG/2ML IJ SOLN
4.0000 mg | Freq: Four times a day (QID) | INTRAMUSCULAR | Status: DC | PRN
  Administered 2024-02-01: 4 mg via INTRAVENOUS

## 2024-01-31 MED ORDER — DIATRIZOATE MEGLUMINE & SODIUM 66-10 % PO SOLN
90.0000 mL | Freq: Once | ORAL | Status: AC
  Administered 2024-01-31: 90 mL via NASOGASTRIC
  Filled 2024-01-31: qty 90

## 2024-01-31 MED ORDER — SODIUM CHLORIDE 0.9 % IV SOLN: INTRAVENOUS | Status: AC

## 2024-01-31 MED ORDER — HEPARIN BOLUS VIA INFUSION
4000.0000 [IU] | Freq: Once | INTRAVENOUS | Status: AC
  Administered 2024-01-31: 4000 [IU] via INTRAVENOUS
  Filled 2024-01-31: qty 4000

## 2024-01-31 NOTE — Hospital Course (Addendum)
 Mr. 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  He underwent ex lap with small bowel resection, repair of small bowel serosa, and lysis of adhesions with general surgery on 02/01/2024.

## 2024-01-31 NOTE — Progress Notes (Signed)
 PHARMACY - ANTICOAGULATION CONSULT NOTE  Pharmacy Consult for heparin Indication: hx of internal jugular thrombus  No Known Allergies  Patient Measurements: Height: 5\' 8"  (172.7 cm) Weight: 70.3 kg (155 lb) IBW/kg (Calculated) : 68.4 Heparin Dosing Weight: 70.3kg  Vital Signs: Temp: 98 F (36.7 C) (03/04 1230) Temp Source: Oral (03/04 1230) BP: 122/95 (03/04 1100) Pulse Rate: 91 (03/04 1100)  Labs: Recent Labs    01/30/24 1407 01/30/24 1800 01/31/24 0455 01/31/24 1122  HGB 15.8 15.5 14.0  --   HCT 45.5 45.5 41.1  --   PLT 328 336 301  --   APTT  --   --  25  --   LABPROT  --   --  14.1  --   INR  --   --  1.1  --   HEPARINUNFRC  --   --  <0.10* 0.54  CREATININE 1.74* 1.79* 1.58*  --   TROPONINIHS  --   --  4  --     Estimated Creatinine Clearance: 59.5 mL/min (A) (by C-G formula based on SCr of 1.58 mg/dL (H)).   Medical History: Past Medical History:  Diagnosis Date   Anxiety    Asthma    Avascular necrosis of hip, left (HCC) 07/24/2021   Bright red blood per rectum 12/16/2022   Closed fracture of nasal bone with routine healing 12/24/2016   Depression    DVT (deep venous thrombosis) (HCC)    PTSD (post-traumatic stress disorder)      Assessment: 43 year old male comes in with chief complaint of vomiting, abdominal pain. Pt has previous history of colectomy and J-pouch placement at [facility] because of multiple polyps in his colon.  He also has history of throat cancer, for which he finished his given radiation sometime in November.  Patient also has known history of clot close to his port, Pharmacy consulted to dose heparin drip.  Today, 01/31/24 CBC WNL, Scr 1.79 Per MD notes pt not taking apixaban or xarelto due to cost  11:22 1st heparin level therapeutic at 0.54 with IV heparin infusing at 1150 units/hr No bleeding noted  Goal of Therapy:  Heparin level 0.3-0.7 units/ml Monitor platelets by anticoagulation protocol: Yes   Plan:  Continue IV  heparin at 1150 units/hr Obtain confirmatory heparin level at 1800 Monitor daily heparin level, CBC, signs/symptoms of bleeding F/U any plans/need to hold IV heparin for surgery   Thank you for allowing pharmacy to be a part of this patient's care.  Selinda Eon, PharmD, BCPS Clinical Pharmacist [place] 01/31/2024 12:43 PM

## 2024-01-31 NOTE — H&P (Signed)
 History and Physical    Harry Williams WUX:[id number] DOB: 16-Jan-1981 DOA: 01/30/2024  PCP: [name], MD  Patient coming from: Home  Chief Complaint: Abdominal pain  HPI: Harry Williams is a 43 y.o. 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  Vital signs on arrival: Temperature 97.8 F, pulse 99, respiratory rate 16, blood pressure 127/100, and SpO2 97% on room air.  CT showing high-grade small bowel closed-loop obstruction in the right hemiabdomen but no evidence of ischemia.  Labs showing no leukocytosis, sodium 128, potassium 5.7, chloride 85, BUN 30, creatinine 1.8 (baseline 0.7-1.0).  EKG showing sinus rhythm and T wave inversions inferolaterally. Patient was given IV insulin, IV sodium bicarb, and IV calcium gluconate for hyperkalemia.  He was also given 1 L LR.  General surgery recommended keeping G-tube on suction for decompression and did not feel that he needed emergent surgical intervention at this time.  [facility] called to admit.  Patient reports history of tonsillar cancer for which he received chemo and radiation last year.  He has had a PEG tube since then for feeding but also able to take food by mouth.  For the past 1 week he is having generalized abdominal pain, nausea, vomiting, and has not been able to tolerate p.o. intake.  His last bowel movement was a week ago.  He reports improvement of his abdominal pain since after his G-tube was placed on suction.  Patient states he was placed on Lovenox injections for his right  IJ thromboembolism back in December but later saw his oncologist and was prescribed oral anticoagulant but he was not able to get either Eliquis or Xarelto from the pharmacy due to high cost.  He has not been on any anticoagulation for the past 1 month.  Review of Systems:  Review of Systems  All other systems reviewed and are negative.   Past Medical History:  Diagnosis Date   Anxiety    Asthma    Avascular necrosis of hip, left (HCC) 07/24/2021   Bright red blood per rectum 12/16/2022   Closed fracture of nasal bone with routine healing 12/24/2016   Depression    DVT (deep venous thrombosis) (HCC)    PTSD (post-traumatic stress disorder)     Past Surgical History:  Procedure Laterality Date   APPENDECTOMY     COLON SURGERY  03/30/2015   IR GASTROSTOMY TUBE MOD SED  09/22/2023   IR IMAGING GUIDED PORT INSERTION  09/22/2023   JOINT REPLACEMENT  2008   partial right elbow; right femoral graft   Total abdominal proctocolectomy, J-pouch, ileal pouch anal anastomosis  2016     reports that he has never smoked. He has never used smokeless tobacco. He reports current alcohol use. He reports that he does not use drugs.  No Known Allergies  Family History  Problem Relation Age of Onset   Arthritis Mother    Anemia Mother    Diabetes Father    Hypertension Brother    Asthma Daughter    Heart disease Paternal Grandmother    Cancer Paternal Grandfather     Prior to Admission medications  Medication Sig Start Date End Date Taking? Authorizing Provider  baclofen (LIORESAL) 10 MG tablet Take 1 tablet (10 mg total) by mouth 3 (three) times daily. Patient taking differently: Take 10 mg by mouth 3 (three) times daily as needed for muscle spasms. 10/04/23  Yes Walisiewicz, Yvonna Alanis E, PA-C  celecoxib (CELEBREX) 200 MG capsule Place 1 capsule (200 mg total) into feeding tube daily as needed for moderate pain (pain score 4-6). 11/29/23  Yes [name], MD  dicyclomine (BENTYL) 20  MG tablet Take 1 tablet (20 mg total) by mouth 2 (two) times daily. Patient taking differently: Take 20 mg by mouth 2 (two) times daily as needed for spasms. 01/26/24  Yes Palumbo, April, MD  HYDROcodone-acetaminophen (HYCET) 7.5-325 mg/15 ml solution Take 10 mLs by mouth every 4 (four) hours as needed for moderate pain (pain score 4-6). 12/13/23  Yes Pasam, [name], MD  lidocaine (XYLOCAINE) 2 % solution Patient: Mix 1part 2% viscous lidocaine, 1part H20. Swish & swallow 10mL of diluted mixture, before meals and at bedtime, up to QID Patient taking differently: Use as directed 15 mLs in the mouth or throat every 6 (six) hours as needed for mouth pain. Mix 1 part 2% viscous lidocaine with 1 part water. Swish & swallow 10 ml's of diluted mixture up to 4 times a day PRN 10/10/23  Yes [name], MD  Nutritional Supplements (FEEDING SUPPLEMENT, OSMOLITE 1.5 CAL,) LIQD 7 cartons Osmolite 1.5 split over four feeding daily. Flush tube with 60 ml water before and after each bolus. Provide additional 3 1/2 cups (830 ml) water/day to meet hydration needs. Provides 2485 kcal, 104.3 g protein, 1267 ml free water from formula (2577 ml total water with flushes). 1659 ml/day meets 100% RDI 10/18/23  Yes [name], MD  omeprazole (PRILOSEC) 20 MG capsule Take 20 mg by mouth See admin instructions. 20 mg per tube once a day PRN   Yes [provider]  ondansetron (ZOFRAN-ODT) 8 MG disintegrating tablet 8mg  ODT q8hours prn nausea Patient taking differently: Take 8 mg by mouth every 8 (eight) hours as needed for nausea or vomiting. 8mg  ODT q8hours prn nausea 01/26/24  Yes Palumbo, April, MD  apixaban (ELIQUIS) 5 MG TABS tablet Take 1 tablet (5 mg total) by mouth 2 (two) times daily. Patient not taking: Reported on 01/31/2024 12/15/23   Pasam, [name], MD  PARoxetine (PAXIL CR) 12.5 MG 24 hr tablet Take 1 tablet (12.5 mg total) by mouth daily. Patient not taking: Reported on 11/01/2023 08/30/23   Thresa Ross, MD  rivaroxaban (XARELTO) 20 MG TABS tablet Take 1 tablet (20 mg total) by mouth daily with supper. Patient not taking: Reported on 01/31/2024 01/02/24   Pasam, [name], MD  scopolamine (TRANSDERM-SCOP) 1 MG/3DAYS Place 1 patch (1.5 mg total) onto the skin every 3 (three) days. Patient not taking: Reported on 01/31/2024 10/31/23   [name], MD    Physical Exam: Vitals:   01/30/24 2200 01/30/24 2222 01/31/24 0100 01/31/24 0252  BP: 121/84  105/71 124/88  Pulse: 95  (!) 104 96  Resp: 12  20 19   Temp:  98.2 F (36.8 C)  98.3 F (36.8 C)  TempSrc:  Oral  Oral  SpO2: 95%  95% 97%  Weight:      Height:        Physical Exam Vitals reviewed.  Constitutional:      General: He is not in acute distress. HENT:     Head: Normocephalic and atraumatic.  Eyes:  Extraocular Movements: Extraocular movements intact.  Cardiovascular:     Rate and Rhythm: Normal rate and regular rhythm.     Pulses: Normal pulses.  Pulmonary:     Effort: Pulmonary effort is normal. No respiratory distress.     Breath sounds: Normal breath sounds. No wheezing or rales.  Abdominal:     General: There is no distension.     Palpations: Abdomen is soft.     Tenderness: There is no abdominal tenderness. There is no guarding or rebound.     Comments: Bowel sounds present  Musculoskeletal:     Cervical back: Normal range of motion.     Right lower leg: No edema.     Left lower leg: No edema.  Skin:    General: Skin is warm and dry.  Neurological:     General: No focal deficit present.     Mental Status: He is alert and oriented to person, place, and time.     Labs on Admission: I have personally reviewed following labs and imaging studies  CBC: Recent Labs  Lab 01/25/24 2250 01/30/24 1407 01/30/24 1800  WBC 3.4* 5.6 5.5  NEUTROABS  --   --  4.1  HGB 13.1 15.8 15.5  HCT 39.4 45.5 45.5  MCV 93.4 87.8 89.9  PLT 168 328 336   Basic Metabolic Panel: Recent Labs  Lab 01/25/24 2250  01/30/24 1407 01/30/24 1800  NA 135 131* 128*  K 4.1 5.7* 5.7*  CL 98 85* 85*  CO2 23 33* 29  GLUCOSE 104* 163* 147*  BUN 10 28* 30*  CREATININE 1.10 1.74* 1.79*  CALCIUM 9.3 10.3 9.5   GFR: Estimated Creatinine Clearance: 52.5 mL/min (A) (by C-G formula based on SCr of 1.79 mg/dL (H)). Liver Function Tests: Recent Labs  Lab 01/25/24 2250 01/30/24 1407 01/30/24 1800  AST 17 11* 16  ALT 16 9 15   ALKPHOS 75 77 73  BILITOT 1.0 0.8 1.0  PROT 7.3 8.3* 8.6*  ALBUMIN 3.9 4.9 4.3   Recent Labs  Lab 01/25/24 2250 01/30/24 1407 01/30/24 1800  LIPASE 27 20 27    No results for input(s): "AMMONIA" in the last 168 hours. Coagulation Profile: No results for input(s): "INR", "PROTIME" in the last 168 hours. Cardiac Enzymes: No results for input(s): "CKTOTAL", "CKMB", "CKMBINDEX", "TROPONINI" in the last 168 hours. BNP (last 3 results) No results for input(s): "PROBNP" in the last 8760 hours. HbA1C: No results for input(s): "HGBA1C" in the last 72 hours. CBG: Recent Labs  Lab 01/31/24 0046  GLUCAP 194*   Lipid Profile: No results for input(s): "CHOL", "HDL", "LDLCALC", "TRIG", "CHOLHDL", "LDLDIRECT" in the last 72 hours. Thyroid Function Tests: No results for input(s): "TSH", "T4TOTAL", "FREET4", "T3FREE", "THYROIDAB" in the last 72 hours. Anemia Panel: No results for input(s): "VITAMINB12", "FOLATE", "FERRITIN", "TIBC", "IRON", "RETICCTPCT" in the last 72 hours. Urine analysis:    Component Value Date/Time   COLORURINE YELLOW 01/31/2024 0146   APPEARANCEUR CLEAR 01/31/2024 0146   LABSPEC >1.046 (H) 01/31/2024 0146   PHURINE 5.0 01/31/2024 0146   GLUCOSEU 150 (A) 01/31/2024 0146   HGBUR NEGATIVE 01/31/2024 0146   BILIRUBINUR NEGATIVE 01/31/2024 0146   KETONESUR 5 (A) 01/31/2024 0146   PROTEINUR NEGATIVE 01/31/2024 0146   NITRITE NEGATIVE 01/31/2024 0146   LEUKOCYTESUR NEGATIVE 01/31/2024 0146    Radiological Exams on Admission: CT ABDOMEN PELVIS W  CONTRAST Result Date: 01/30/2024 CLINICAL DATA:  Abdominal pain.  Bowel obstruction suspected. EXAM: CT ABDOMEN AND PELVIS WITH CONTRAST TECHNIQUE: Multidetector  CT imaging of the abdomen and pelvis was performed using the standard protocol following bolus administration of intravenous contrast. RADIATION DOSE REDUCTION: This exam was performed according to the departmental dose-optimization program which includes automated exposure control, adjustment of the mA and/or kV according to patient size and/or use of iterative reconstruction technique. CONTRAST:  OMNIPAQUE IOHEXOL 300 MG/ML  SOLN COMPARISON:  CT 01/26/2024 FINDINGS: Lower chest: No acute abnormality. Hepatobiliary: No acute abnormality. Pancreas: Unremarkable. Spleen: Unremarkable. Adrenals/Urinary Tract: Within normal limits. Stomach/Bowel: Percutaneous gastrostomy tube in unchanged position. Postoperative changes of colectomy with ileoanal anastomosis. Diffuse dilation of the small bowel. There is an abrupt transition point in the right central abdomen (series 8/image [id number]). There is an adjacent transition point downstream from the original transition point with dilation of the small bowel between the transition points (series 8/image 56). Findings are compatible with closed loop obstruction. No signs of ischemic bowel. Vascular/Lymphatic: No significant vascular findings are present. No enlarged abdominal or pelvic lymph nodes. Reproductive: Unremarkable. Other: Small volume free fluid in the pelvis. No free intraperitoneal air. Musculoskeletal: No acute fracture. 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 IMPRESSION: High-grade small bowel closed loop obstruction in the right hemiabdomen. No evidence of ischemia. Critical Value/emergent results were called by telephone at the time of interpretation on 01/30/2024 at 11:10 pm to provider Riki Sheer , who verbally acknowledged these results. Electronically Signed   By: Minerva Fester M.D.   On: 01/30/2024 23:11    Assessment and Plan  High-grade SBO Patient's abdominal pain has improved since his G-tube was placed on suction for decompression.  General surgery does not feel that he needs emergent surgical intervention at this time.  Keep n.p.o., IV fluid hydration, antiemetic as needed, and pain management.  AKI Likely prerenal from dehydration.  BUN 30, creatinine 1.8 (baseline 0.7-1.0).  Continue IV fluid hydration and monitor renal function.  Avoid nephrotoxic agents.  Hypovolemic hyponatremia Continue IV fluid hydration with normal saline and monitor sodium level.  Hyperkalemia In the setting of AKI.  Patient was given IV insulin, IV sodium bicarb, and IV calcium gluconate in the ED.  Repeat labs ordered to check potassium level.  Hypochloremia In the setting of vomiting.  Continue IV fluid hydration with normal saline and monitor labs.  EKG abnormality EKG showing T wave inversions inferolaterally which appear new compared to previous EKG from 2017.  Question whether this is related to electrolyte derangement.  ACS less likely as patient is not endorsing chest pain.  Stat troponin ordered.  Malignant neoplasm of tonsillar fossa status post chemoradiation Outpatient oncology follow-up.  Right internal jugular vein thrombus discovered in December 2024 Patient was initially on Lovenox injections and later switched to oral anticoagulation which he was not able to get due to high cost.  Has been off anticoagulation for the past 1 month.  Start IV heparin since no plans for emergent surgical intervention at this time per general surgery.  He will need CT with contrast to assess for clot burden once his renal function improves.  Asthma Stable, no signs of acute exacerbation.  Bronchodilator as needed.  DVT prophylaxis: IV heparin gtt Code Status: Full Code (discussed with the patient) Family Communication: No family available at this time. Consults  called: General Surgery Level of care: Telemetry bed Admission status: It is my clinical opinion that admission to INPATIENT is reasonable and necessary because of the expectation that this patient will require hospital care that crosses at least 2 midnights to treat  this condition based on the medical complexity of the problems presented.  Given the aforementioned information, the predictability of an adverse outcome is felt to be significant.  Harry Giovanni MD [facility]  If 7PM-7AM, please contact night-coverage [email or web address]  01/31/2024, 3:10 AM

## 2024-01-31 NOTE — Progress Notes (Signed)
 43 year old man with malignancy of tonsillar fossa status post chemoradiation, oral mucositis due to radiation and with PEG tube for feeding, FAP s/p total colectomy in 2016, rectal bleeding/hemorrhoids, right internal jugular vein thrombus in 10/2023 and stopped taking anticoagulation x 1 month, asthma, AVN of left hip, anxiety/depression/PTSD presented with abdominal pain nausea vomiting no BM x 5 days, in the ED found to have high-grade SBO and admitted for further management after consulting [facility].  Procedure: CT abdomen pelvis with contrast> high-grade SBP closed-loop obstruction in the right, no evidence of ischemia.  Consultation: [facility]    Patient denies nausea or vomiting abdominal pain no bowel movement or flatus yet PEG was dislodged and was reinserted this morning. Abdomen soft He is not in distress  A/p:  High-grade SBO S//P total colectomy for FAP in 2016 PEG in place for feeding/mucositis after tonsillar malignancy: General Surgery is consulted.  Continue IVF, antiemetics bowel rest, stomach decompression with G-tube.  AKI Hypovolemic hyponatremia/hypochloremia Hyperkalemia: Cont ivf.  Monitor electrolytes  T wave inversion on EKG: ?  Etiology, ?  Artifact , troponin negative at 4.  Tonsillar fossae malignancy status post chemoradiation f/v oncology, he is due for a restaging with his oncology  Rt internal jugular thrombus December/2024: Initially on Lovenox, switch to oral anticoagulation but not able to afford due to cost and has been not taking x 1 month, placed on heparin on admission. CT w/ contrast will be needed once renal function is stable to assess clot burden. Managed by Dr. Lysbeth Galas from oncology. Continue heparin drip pharmacy monitoring.  Asthma: Stable.

## 2024-01-31 NOTE — Progress Notes (Signed)
 PHARMACY - ANTICOAGULATION CONSULT NOTE  Pharmacy Consult for heparin Indication: hx of internal jugular thrombus  No Known Allergies  Patient Measurements: Height: 5\' 8"  (172.7 cm) Weight: 70.3 kg (155 lb) IBW/kg (Calculated) : 68.4 Heparin Dosing Weight: 70.3kg  Vital Signs: Temp: 98.3 F (36.8 C) (03/04 0252) Temp Source: Oral (03/04 0252) BP: 124/88 (03/04 0252) Pulse Rate: 96 (03/04 0252)  Labs: Recent Labs    01/30/24 1407 01/30/24 1800  HGB 15.8 15.5  HCT 45.5 45.5  PLT 328 336  CREATININE 1.74* 1.79*    Estimated Creatinine Clearance: 52.5 mL/min (A) (by C-G formula based on SCr of 1.79 mg/dL (H)).   Medical History: Past Medical History:  Diagnosis Date   Anxiety    Asthma    Avascular necrosis of hip, left (HCC) 07/24/2021   Bright red blood per rectum 12/16/2022   Closed fracture of nasal bone with routine healing 12/24/2016   Depression    DVT (deep venous thrombosis) (HCC)    PTSD (post-traumatic stress disorder)      Assessment: 43 year old male comes in with chief complaint of vomiting, abdominal pain. Pt has previous history of colectomy and J-pouch placement at [facility] because of multiple polyps in his colon.  He also has history of throat cancer, for which he finished his given radiation sometime in November.  Patient also has known history of clot close to his port, pharmacy to dose heparin drip.  CBC WNL, Scr 1.79 Per MD notes pt not taking apixaban or xarelto due to cost  Goal of Therapy:  Heparin level 0.3-0.7 units/ml Monitor platelets by anticoagulation protocol: Yes   Plan:  Baseline labs STAT Heparin bolus 4000 units x 1 Start heparin drip at 1150 units/hr Heparin level in 6 hours Daily CBC   Arley Phenix RPh 01/31/2024, 4:12 AM

## 2024-01-31 NOTE — Consult Note (Signed)
 Reason for Consult:SBO Referring Physician: [name] is an 43 y.o. male.  HPI:  Patient is a 43 year old male who presented to the emergency department March 3 with complaints of 5 days of nausea, vomiting, decreased stool and flatus, and abdominal pain.  Patient has been trying his best to taken food and supplements given his previous weight loss, but over the past few days he has felt like everything he eats just sits there.  It did start to call come back up today.  The patient is status post total abdominal proctocolectomy with J-pouch for familial adenomatous polyposis in 2016 at [facility] with Dr. Epifania Williams.  His pain has resolved since his G-tube was hooked up to suction.  He denies fevers or chills.  He has had some decreased urine output.  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  He got a feeding tube to assist with getting through treatment.  He received 4 cycles of chemo but abandon this after he had significant neutropenia and mucositis.  He completed radiation therapy.  He is waiting for restaging scans in a couple of weeks.  Past Medical History:  Diagnosis Date   Anxiety    Asthma    Avascular necrosis of hip, left (HCC) 07/24/2021   Bright red blood per rectum 12/16/2022   Closed fracture of nasal bone with routine healing 12/24/2016   Depression    DVT (deep venous thrombosis) (HCC)    PTSD (post-traumatic stress disorder)     Past Surgical History:  Procedure Laterality Date   APPENDECTOMY     COLON SURGERY  03/30/2015   IR GASTROSTOMY TUBE MOD SED  09/22/2023   IR IMAGING GUIDED PORT INSERTION  09/22/2023   JOINT REPLACEMENT  2008   partial right elbow; right femoral graft   Total abdominal proctocolectomy, J-pouch, ileal pouch anal anastomosis  2016    Family History  Problem Relation Age of Onset   Arthritis Mother    Anemia Mother    Diabetes Father    Hypertension Brother    Asthma  Daughter    Heart disease Paternal Grandmother    Cancer Paternal Grandfather     Social History:  reports that he has never smoked. He has never used smokeless tobacco. He reports current alcohol use. He reports that he does not use drugs.  Allergies: No Known Allergies  Medications:  baclofen (LIORESAL) 10 MG tablet celecoxib (CELEBREX) 200 MG capsule dicyclomine (BENTYL) 20 MG tablet fentaNYL (DURAGESIC) 50 MCG/HR HYDROcodone-acetaminophen (HYCET) 7.5-325 mg/15 ml solution lidocaine (XYLOCAINE) 2 % solution Nutritional Supplements (FEEDING SUPPLEMENT, OSMOLITE 1.5 CAL,) LIQD omeprazole (PRILOSEC) 20 MG capsule ondansetron (ZOFRAN-ODT) 8 MG disintegrating tablet PARoxetine (PAXIL CR) 12.5 MG 24 hr tablet scopolamine (TRANSDERM-SCOP) 1 MG/3DAYS   Results for orders placed or performed during the hospital encounter of 01/30/24 (from the past 48 hours)  CBC with Differential     Status: Abnormal   Collection Time: 01/30/24  6:00 PM  Result Value Ref Range   WBC 5.5 4.0 - 10.5 K/uL   RBC 5.06 4.22 - 5.81 MIL/uL   Hemoglobin 15.5 13.0 - 17.0 g/dL   HCT 81.1 91.4 - 78.2 %   MCV 89.9 80.0 - 100.0 fL   MCH 30.6 26.0 - 34.0 pg   MCHC 34.1 30.0 - 36.0 g/dL   RDW 95.6 21.3 - 08.6 %   Platelets 336 150 - 400 K/uL   nRBC 0.0 0.0 - 0.2 %  Neutrophils Relative % 75 %   Neutro Abs 4.1 1.7 - 7.7 K/uL   Lymphocytes Relative 5 %   Lymphs Abs 0.3 (L) 0.7 - 4.0 K/uL   Monocytes Relative 19 %   Monocytes Absolute 1.0 0.1 - 1.0 K/uL   Eosinophils Relative 0 %   Eosinophils Absolute 0.0 0.0 - 0.5 K/uL   Basophils Relative 1 %   Basophils Absolute 0.0 0.0 - 0.1 K/uL   Immature Granulocytes 0 %   Abs Immature Granulocytes 0.02 0.00 - 0.07 K/uL    Comment: Performed at [facility], [address], [place], [place] [address]  Comprehensive metabolic panel     Status: Abnormal   Collection Time: 01/30/24  6:00 PM  Result Value Ref Range   Sodium 128 (L) 135 - 145 mmol/L    Potassium 5.7 (H) 3.5 - 5.1 mmol/L   Chloride 85 (L) 98 - 111 mmol/L   CO2 29 22 - 32 mmol/L   Glucose, Bld 147 (H) 70 - 99 mg/dL    Comment: Glucose reference range applies only to samples taken after fasting for at least 8 hours.   BUN 30 (H) 6 - 20 mg/dL   Creatinine, Ser 9.14 (H) 0.61 - 1.24 mg/dL   Calcium 9.5 8.9 - 78.2 mg/dL   Total Protein 8.6 (H) 6.5 - 8.1 g/dL   Albumin 4.3 3.5 - 5.0 g/dL   AST 16 15 - 41 U/L   ALT 15 0 - 44 U/L   Alkaline Phosphatase 73 38 - 126 U/L   Total Bilirubin 1.0 0.0 - 1.2 mg/dL   GFR, Estimated 48 (L) >60 mL/min    Comment: (NOTE) Calculated using the CKD-EPI Creatinine Equation (2021)    Anion gap 14 5 - 15    Comment: Performed at [facility], [address], [place], [place] [address]  Lipase, blood     Status: None   Collection Time: 01/30/24  6:00 PM  Result Value Ref Range   Lipase 27 11 - 51 U/L    Comment: Performed at [facility], [address], [place], [place] [address]  CBG monitoring, ED     Status: Abnormal   Collection Time: 01/31/24 12:46 AM  Result Value Ref Range   Glucose-Capillary 194 (H) 70 - 99 mg/dL    Comment: Glucose reference range applies only to samples taken after fasting for at least 8 hours.    CT ABDOMEN PELVIS W CONTRAST Result Date: 01/30/2024 CLINICAL DATA:  Abdominal pain.  Bowel obstruction suspected. EXAM: CT ABDOMEN AND PELVIS WITH CONTRAST TECHNIQUE: Multidetector CT imaging of the abdomen and pelvis was performed using the standard protocol following bolus administration of intravenous contrast. RADIATION DOSE REDUCTION: This exam was performed according to the departmental dose-optimization program which includes automated exposure control, adjustment of the mA and/or kV according to patient size and/or use of iterative reconstruction technique. CONTRAST:  OMNIPAQUE IOHEXOL 300 MG/ML  SOLN COMPARISON:  CT 01/26/2024 FINDINGS: Lower chest: No acute  abnormality. Hepatobiliary: No acute abnormality. Pancreas: Unremarkable. Spleen: Unremarkable. Adrenals/Urinary Tract: Within normal limits. Stomach/Bowel: Percutaneous gastrostomy tube in unchanged position. Postoperative changes of colectomy with ileoanal anastomosis. Diffuse dilation of the small bowel. There is an abrupt transition point in the right central abdomen (series 8/image 54-67). There is an adjacent transition point downstream from the original transition point with dilation of the small bowel between the transition points (series 8/image 56). Findings are compatible with closed loop obstruction. No  signs of ischemic bowel. Vascular/Lymphatic: No significant vascular findings are present. No enlarged abdominal or pelvic lymph nodes. Reproductive: Unremarkable. Other: Small volume free fluid in the pelvis. No free intraperitoneal air. Musculoskeletal: No acute fracture. 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 IMPRESSION: High-grade small bowel closed loop obstruction in the right hemiabdomen. No evidence of ischemia. Critical Value/emergent results were called by telephone at the time of interpretation on 01/30/2024 at 11:10 pm to provider Riki Sheer , who verbally acknowledged these results. Electronically Signed   By: Minerva Fester M.D.   On: 01/30/2024 23:11    Review of Systems  Constitutional:  Positive for appetite change and fatigue.  HENT:         Decreased size of left cervical lymph node  Eyes: Negative.   Respiratory: Negative.    Cardiovascular: Negative.   Gastrointestinal:  Positive for abdominal distention, abdominal pain, nausea and vomiting.  Endocrine: Negative.   Genitourinary:        Dark urine  Musculoskeletal: Negative.   Allergic/Immunologic: Negative.   Neurological:  Positive for dizziness.  Hematological: Negative.   Psychiatric/Behavioral: Negative.    All other systems reviewed and are negative.  Blood pressure 105/71, pulse (!) 104,  temperature 98.2 F (36.8 C), temperature source Oral, resp. rate 20, height 5\' 8"  (1.727 m), weight 70.3 kg, SpO2 95%. Physical Exam Vitals reviewed.  Constitutional:      General: He is not in acute distress.    Appearance: He is well-developed. He is ill-appearing. He is not toxic-appearing.     Comments: Looks very thin  HENT:     Head: Normocephalic and atraumatic.  Eyes:     General: No scleral icterus.    Extraocular Movements: Extraocular movements intact.     Pupils: Pupils are equal, round, and reactive to light.  Cardiovascular:     Rate and Rhythm: Normal rate and regular rhythm.     Heart sounds: Normal heart sounds. No murmur heard.    No friction rub.  Pulmonary:     Effort: Pulmonary effort is normal.     Breath sounds: Normal breath sounds. No stridor. No wheezing or rhonchi.  Chest:     Chest wall: No tenderness.  Abdominal:     General: A surgical scar is present. Bowel sounds are absent. There is no abdominal bruit. There are no signs of injury.     Palpations: Abdomen is soft. There is no shifting dullness, fluid wave, hepatomegaly, splenomegaly, mass or pulsatile mass.     Tenderness: There is no abdominal tenderness. There is no guarding or rebound.     Comments: Slightly distended  Skin:    General: Skin is warm and dry.     Capillary Refill: Capillary refill takes 2 to 3 seconds.     Coloration: Skin is not cyanotic, jaundiced, mottled or pale.  Neurological:     General: No focal deficit present.     Mental Status: He is alert and oriented to person, place, and time.     Cranial Nerves: No cranial nerve deficit.     Motor: No weakness.  Psychiatric:        Mood and Affect: Mood normal. Mood is not anxious or depressed.        Behavior: Behavior normal.     Assessment/Plan:  SBO, ? Closed loop G tube in place Tonsillar cancer (stage I), s/p chemo x 4 cycles and XRT FAP, s/p TAC with J pouch Internal jugular VTE associated with port NOT  on  anticoagulation Dehydration Acute kidney injury Hyponatremia Hyperkalemia Hypochloremia hyperglycemia  I do not think the patient needs emergent surgical intervention given that his pain resolved quickly with NG tube decompression.  He needs significant rehydration and electrolyte correction.  His original medication list showed him to be on Eliquis and/or Xarelto.  His wife reports that he took 2 weeks of Lovenox but did not get oral anticoagulation due to cost.  We will perform small bowel protocol via the G-tube after bit longer of decompression.  I do think he is slightly more likely to need surgical intervention given the appearance on the CT scan.  He will remain n.p.o.   Maudry Diego, MD, FACS, FSSO Surgical Oncology, General Surgery, [facility], [place] [phone] for weekday/non holidays Check [email or web address] for coverage night/weekend/holidays

## 2024-01-31 NOTE — Progress Notes (Signed)
 PHARMACY - ANTICOAGULATION CONSULT NOTE  Pharmacy Consult for heparin Indication: hx of internal jugular thrombus  No Known Allergies  Patient Measurements: Height: 5\' 8"  (172.7 cm) Weight: 70.3 kg (155 lb) IBW/kg (Calculated) : 68.4 Heparin Dosing Weight: 70.3kg  Vital Signs: Temp: 98.5 F (36.9 C) (03/04 1520) Temp Source: Oral (03/04 1520) BP: 129/89 (03/04 1520) Pulse Rate: 85 (03/04 1520)  Labs: Recent Labs    01/30/24 1407 01/30/24 1800 01/31/24 0455 01/31/24 1122 01/31/24 1809  HGB 15.8 15.5 14.0  --   --   HCT 45.5 45.5 41.1  --   --   PLT 328 336 301  --   --   APTT  --   --  25  --   --   LABPROT  --   --  14.1  --   --   INR  --   --  1.1  --   --   HEPARINUNFRC  --   --  <0.10* 0.54 0.39  CREATININE 1.74* 1.79* 1.58*  --   --   TROPONINIHS  --   --  4  --   --     Estimated Creatinine Clearance: 59.5 mL/min (A) (by C-G formula based on SCr of 1.58 mg/dL (H)).   Assessment: 43 year old male comes in with chief complaint of vomiting, abdominal pain. Pt has previous history of colectomy and J-pouch placement at [facility] because of multiple polyps in his colon.  He also has history of throat cancer, for which he finished his given radiation sometime in November.  Patient also has known history of clot close to his port, Pharmacy consulted to dose heparin drip.  Today, 01/31/24 CBC WNL this AM; stable from priors Confimatory heparin level remains therapeutic although lower on 1150 units/hr Admission AKI resolving (baseline SCr < 1.0) No reported infusion or bleeding issues per RN  Goal of Therapy:  Heparin level 0.3-0.7 units/ml Monitor platelets by anticoagulation protocol: Yes   Plan:  Increase IV heparin to 1200 units/hr Monitor daily heparin level, CBC, signs/symptoms of bleeding F/U any plans/need to hold IV heparin for surgery   Thank you for allowing pharmacy to be a part of this patient's care.  Bernadene Person, PharmD,  BCPS [phone] 01/31/2024, 7:25 PM

## 2024-02-01 ENCOUNTER — Inpatient Hospital Stay (HOSPITAL_COMMUNITY): Admitting: Anesthesiology

## 2024-02-01 ENCOUNTER — Inpatient Hospital Stay (HOSPITAL_COMMUNITY)

## 2024-02-01 ENCOUNTER — Encounter (HOSPITAL_COMMUNITY)
Admission: EM | Disposition: A | Discharge status: Home / Self Care | Payer: Self-pay | Source: Home / Self Care | Attending: Family Medicine

## 2024-02-01 ENCOUNTER — Encounter (HOSPITAL_COMMUNITY): Payer: Self-pay | Admitting: Internal Medicine

## 2024-02-01 DIAGNOSIS — K56609 Unspecified intestinal obstruction, unspecified as to partial versus complete obstruction: Secondary | ICD-10-CM | POA: Diagnosis not present

## 2024-02-01 HISTORY — PX: LAPAROTOMY: SHX154

## 2024-02-01 LAB — CBC
HCT: 43 % (ref 39.0–52.0)
Hemoglobin: 13.8 g/dL (ref 13.0–17.0)
MCH: 30.4 pg (ref 26.0–34.0)
MCHC: 32.1 g/dL (ref 30.0–36.0)
MCV: 94.7 fL (ref 80.0–100.0)
Platelets: 322 10*3/uL (ref 150–400)
RBC: 4.54 MIL/uL (ref 4.22–5.81)
RDW: 12.8 % (ref 11.5–15.5)
WBC: 5 10*3/uL (ref 4.0–10.5)
nRBC: 0 % (ref 0.0–0.2)

## 2024-02-01 LAB — ABO/RH: ABO/RH(D): AB POS

## 2024-02-01 LAB — TYPE AND SCREEN
ABO/RH(D): AB POS
Antibody Screen: NEGATIVE

## 2024-02-01 LAB — HEPARIN LEVEL (UNFRACTIONATED): Heparin Unfractionated: 0.51 [IU]/mL (ref 0.30–0.70)

## 2024-02-01 SURGERY — LAPAROTOMY, EXPLORATORY
Anesthesia: General | Site: Abdomen

## 2024-02-01 MED ORDER — PROPOFOL 10 MG/ML IV BOLUS
INTRAVENOUS | Status: AC
  Filled 2024-02-01: qty 20

## 2024-02-01 MED ORDER — DIPHENHYDRAMINE HCL 12.5 MG/5ML PO ELIX: 12.5000 mg | ORAL_SOLUTION | Freq: Four times a day (QID) | ORAL | Status: DC | PRN

## 2024-02-01 MED ORDER — CEFAZOLIN SODIUM-DEXTROSE 2-3 GM-%(50ML) IV SOLR
INTRAVENOUS | Status: DC | PRN
  Administered 2024-02-01: 2 g via INTRAVENOUS

## 2024-02-01 MED ORDER — FENTANYL CITRATE (PF) 100 MCG/2ML IJ SOLN
INTRAMUSCULAR | Status: AC
  Filled 2024-02-01: qty 2

## 2024-02-01 MED ORDER — 0.9 % SODIUM CHLORIDE (POUR BTL) OPTIME
TOPICAL | Status: DC | PRN
  Administered 2024-02-01: 3000 mL

## 2024-02-01 MED ORDER — ROCURONIUM BROMIDE 100 MG/10ML IV SOLN
INTRAVENOUS | Status: DC | PRN
  Administered 2024-02-01: 10 mg via INTRAVENOUS
  Administered 2024-02-01: 50 mg via INTRAVENOUS

## 2024-02-01 MED ORDER — NALOXONE HCL 0.4 MG/ML IJ SOLN: 0.4000 mg | INTRAMUSCULAR | Status: DC | PRN

## 2024-02-01 MED ORDER — HYDROMORPHONE 1 MG/ML IV SOLN
INTRAVENOUS | Status: DC
  Administered 2024-02-01: 30 mg via INTRAVENOUS
  Administered 2024-02-02: 1.2 mg via INTRAVENOUS
  Administered 2024-02-02: 4.5 mg via INTRAVENOUS
  Administered 2024-02-03: 30 mg via INTRAVENOUS
  Administered 2024-02-03 (×2): 0.6 mg via INTRAVENOUS
  Administered 2024-02-03: 2.1 mg via INTRAVENOUS
  Administered 2024-02-04 (×2): 1.2 mg via INTRAVENOUS
  Administered 2024-02-04: 0.5 mg via INTRAVENOUS
  Administered 2024-02-04: 1.5 mg via INTRAVENOUS
  Administered 2024-02-05 (×2): 0.6 mg via INTRAVENOUS
  Administered 2024-02-05: 2.7 mg via INTRAVENOUS
  Administered 2024-02-06: 1.2 mg via INTRAVENOUS
  Administered 2024-02-06: 1.8 mg via INTRAVENOUS
  Administered 2024-02-06: 30 mg via INTRAVENOUS
  Administered 2024-02-06: 2.4 mg via INTRAVENOUS
  Administered 2024-02-08: 30 mg via INTRAVENOUS
  Administered 2024-02-08: 3 mg via INTRAVENOUS
  Administered 2024-02-09: 1.5 mg via INTRAVENOUS
  Administered 2024-02-09: 3.3 mg via INTRAVENOUS
  Administered 2024-02-09: 2.1 mg via INTRAVENOUS
  Administered 2024-02-09: 30 mg via INTRAVENOUS
  Administered 2024-02-09: 3 mg via INTRAVENOUS
  Administered 2024-02-10: 1.5 mg via INTRAVENOUS
  Administered 2024-02-10: 0.6 mg via INTRAVENOUS
  Administered 2024-02-10: 0.9 mg via INTRAVENOUS
  Filled 2024-02-01 (×6): qty 30

## 2024-02-01 MED ORDER — DIPHENHYDRAMINE HCL 50 MG/ML IJ SOLN: 12.5000 mg | Freq: Four times a day (QID) | INTRAMUSCULAR | Status: DC | PRN

## 2024-02-01 MED ORDER — ROCURONIUM BROMIDE 10 MG/ML (PF) SYRINGE
PREFILLED_SYRINGE | INTRAVENOUS | Status: AC
  Filled 2024-02-01: qty 10

## 2024-02-01 MED ORDER — PROPOFOL 10 MG/ML IV BOLUS
INTRAVENOUS | Status: DC | PRN
  Administered 2024-02-01: 160 mg via INTRAVENOUS
  Administered 2024-02-01: 40 mg via INTRAVENOUS
  Administered 2024-02-01: 40 ug/kg/min via INTRAVENOUS

## 2024-02-01 MED ORDER — ONDANSETRON HCL 4 MG/2ML IJ SOLN
INTRAMUSCULAR | Status: AC
  Filled 2024-02-01: qty 2

## 2024-02-01 MED ORDER — KETAMINE HCL 50 MG/5ML IJ SOSY
PREFILLED_SYRINGE | INTRAMUSCULAR | Status: AC
  Filled 2024-02-01: qty 5

## 2024-02-01 MED ORDER — HYDROMORPHONE HCL 2 MG/ML IJ SOLN
INTRAMUSCULAR | Status: AC
  Filled 2024-02-01: qty 1

## 2024-02-01 MED ORDER — LIDOCAINE HCL (CARDIAC) PF 100 MG/5ML IV SOSY
PREFILLED_SYRINGE | INTRAVENOUS | Status: DC | PRN
  Administered 2024-02-01: 70 mg via INTRAVENOUS

## 2024-02-01 MED ORDER — DROPERIDOL 2.5 MG/ML IJ SOLN: 0.6250 mg | Freq: Once | INTRAMUSCULAR | Status: DC | PRN

## 2024-02-01 MED ORDER — ACETAMINOPHEN 10 MG/ML IV SOLN
INTRAVENOUS | Status: AC
  Filled 2024-02-01: qty 100

## 2024-02-01 MED ORDER — ACETAMINOPHEN 10 MG/ML IV SOLN
INTRAVENOUS | Status: DC | PRN
  Administered 2024-02-01: 1000 mg via INTRAVENOUS

## 2024-02-01 MED ORDER — MIDAZOLAM HCL 5 MG/5ML IJ SOLN
INTRAMUSCULAR | Status: DC | PRN
  Administered 2024-02-01: 2 mg via INTRAVENOUS

## 2024-02-01 MED ORDER — LIDOCAINE HCL (PF) 2 % IJ SOLN
INTRAMUSCULAR | Status: AC
  Filled 2024-02-01: qty 5

## 2024-02-01 MED ORDER — MIDAZOLAM HCL 2 MG/2ML IJ SOLN
INTRAMUSCULAR | Status: AC
  Filled 2024-02-01: qty 2

## 2024-02-01 MED ORDER — SODIUM CHLORIDE 0.9% FLUSH: 9.0000 mL | INTRAVENOUS | Status: DC | PRN

## 2024-02-01 MED ORDER — HYDROMORPHONE HCL 1 MG/ML IJ SOLN
INTRAMUSCULAR | Status: AC
  Administered 2024-02-01: 0.5 mg via INTRAVENOUS
  Filled 2024-02-01: qty 1

## 2024-02-01 MED ORDER — CHLORHEXIDINE GLUCONATE 0.12 % MT SOLN
15.0000 mL | Freq: Once | OROMUCOSAL | Status: AC
  Administered 2024-02-01: 15 mL via OROMUCOSAL

## 2024-02-01 MED ORDER — HYDROMORPHONE HCL 1 MG/ML IJ SOLN
INTRAMUSCULAR | Status: DC | PRN
  Administered 2024-02-01: .6 mg via INTRAVENOUS

## 2024-02-01 MED ORDER — DEXAMETHASONE SODIUM PHOSPHATE 10 MG/ML IJ SOLN
INTRAMUSCULAR | Status: AC
  Filled 2024-02-01: qty 1

## 2024-02-01 MED ORDER — ORAL CARE MOUTH RINSE: 15.0000 mL | Freq: Once | OROMUCOSAL | Status: AC

## 2024-02-01 MED ORDER — METHOCARBAMOL 1000 MG/10ML IJ SOLN
500.0000 mg | Freq: Three times a day (TID) | INTRAMUSCULAR | Status: DC
  Administered 2024-02-01 – 2024-02-10 (×26): 500 mg via INTRAVENOUS
  Filled 2024-02-01 (×25): qty 10

## 2024-02-01 MED ORDER — SUGAMMADEX SODIUM 200 MG/2ML IV SOLN
INTRAVENOUS | Status: AC
  Filled 2024-02-01: qty 2

## 2024-02-01 MED ORDER — DROPERIDOL 2.5 MG/ML IJ SOLN
INTRAMUSCULAR | Status: AC
  Filled 2024-02-01: qty 2

## 2024-02-01 MED ORDER — LACTATED RINGERS IV SOLN: INTRAVENOUS | Status: DC

## 2024-02-01 MED ORDER — HYDROMORPHONE HCL 1 MG/ML IJ SOLN
INTRAMUSCULAR | Status: AC
Start: 2024-02-01 — End: 2024-02-02
  Filled 2024-02-01: qty 2

## 2024-02-01 MED ORDER — ACETAMINOPHEN 325 MG PO TABS
650.0000 mg | ORAL_TABLET | Freq: Four times a day (QID) | ORAL | Status: DC
Start: 2024-02-01 — End: 2024-02-07
  Administered 2024-02-01 – 2024-02-03 (×7): 650 mg via ORAL
  Filled 2024-02-01 (×7): qty 2

## 2024-02-01 MED ORDER — SUCCINYLCHOLINE CHLORIDE 200 MG/10ML IV SOSY
PREFILLED_SYRINGE | INTRAVENOUS | Status: DC | PRN
  Administered 2024-02-01: 100 mg via INTRAVENOUS

## 2024-02-01 MED ORDER — FENTANYL CITRATE (PF) 100 MCG/2ML IJ SOLN
INTRAMUSCULAR | Status: DC | PRN
  Administered 2024-02-01: 100 ug via INTRAVENOUS

## 2024-02-01 MED ORDER — KETAMINE HCL 10 MG/ML IJ SOLN
INTRAMUSCULAR | Status: DC | PRN
  Administered 2024-02-01 (×2): 20 mg via INTRAVENOUS

## 2024-02-01 MED ORDER — METOPROLOL TARTRATE 5 MG/5ML IV SOLN
INTRAVENOUS | Status: DC | PRN
  Administered 2024-02-01: 2 mg via INTRAVENOUS

## 2024-02-01 MED ORDER — HYDROMORPHONE HCL 1 MG/ML IJ SOLN
0.2500 mg | INTRAMUSCULAR | Status: DC | PRN
  Administered 2024-02-01: 0.5 mg via INTRAVENOUS

## 2024-02-01 MED ORDER — ACETAMINOPHEN 650 MG RE SUPP
650.0000 mg | Freq: Four times a day (QID) | RECTAL | Status: DC
Start: 2024-02-01 — End: 2024-02-07
  Administered 2024-02-02 – 2024-02-03 (×2): 650 mg via RECTAL
  Filled 2024-02-01 (×3): qty 1

## 2024-02-01 MED ORDER — HYDROMORPHONE HCL 1 MG/ML IJ SOLN
0.2500 mg | INTRAMUSCULAR | Status: DC | PRN
  Administered 2024-02-01 (×2): 0.5 mg via INTRAVENOUS

## 2024-02-01 MED ORDER — SUGAMMADEX SODIUM 200 MG/2ML IV SOLN
INTRAVENOUS | Status: DC | PRN
  Administered 2024-02-01 (×2): 200 mg via INTRAVENOUS

## 2024-02-01 MED ORDER — DEXAMETHASONE SODIUM PHOSPHATE 10 MG/ML IJ SOLN
INTRAMUSCULAR | Status: DC | PRN
  Administered 2024-02-01: 8 mg via INTRAVENOUS

## 2024-02-01 SURGICAL SUPPLY — 47 items
BAG COUNTER SPONGE SURGICOUNT (BAG) IMPLANT
BLADE EXTENDED COATED 6.5IN (ELECTRODE) ×1 IMPLANT
CELLS DAT CNTRL 66122 CELL SVR (MISCELLANEOUS) ×1 IMPLANT
CHLORAPREP W/TINT 26 (MISCELLANEOUS) ×1 IMPLANT
COVER MAYO STAND STRL (DRAPES) ×1 IMPLANT
DRAIN CHANNEL 19F RND (DRAIN) IMPLANT
DRAPE LAPAROSCOPIC ABDOMINAL (DRAPES) ×1 IMPLANT
DRAPE SHEET LG 3/4 BI-LAMINATE (DRAPES) IMPLANT
DRAPE UTILITY XL STRL (DRAPES) ×1 IMPLANT
DRAPE WARM FLUID 44X44 (DRAPES) ×1 IMPLANT
DRSG OPSITE POSTOP 4X6 (GAUZE/BANDAGES/DRESSINGS) IMPLANT
ELECT REM PT RETURN 15FT ADLT (MISCELLANEOUS) ×1 IMPLANT
EVACUATOR SILICONE 100CC (DRAIN) IMPLANT
GAUZE SPONGE 4X4 12PLY STRL (GAUZE/BANDAGES/DRESSINGS) ×1 IMPLANT
GLOVE ECLIPSE 8.0 STRL XLNG CF (GLOVE) ×2 IMPLANT
GLOVE INDICATOR 8.0 STRL GRN (GLOVE) ×1 IMPLANT
GOWN STRL REUS W/ TWL XL LVL3 (GOWN DISPOSABLE) ×2 IMPLANT
HANDLE SUCTION POOLE (INSTRUMENTS) ×1 IMPLANT
KIT BASIN OR (CUSTOM PROCEDURE TRAY) ×1 IMPLANT
KIT TURNOVER KIT A (KITS) IMPLANT
LEGGING LITHOTOMY PAIR STRL (DRAPES) IMPLANT
LIGASURE IMPACT 36 18CM CVD LR (INSTRUMENTS) IMPLANT
PACK GENERAL/GYN (CUSTOM PROCEDURE TRAY) ×1 IMPLANT
PAD POSITIONING PINK XL (MISCELLANEOUS) ×1 IMPLANT
RELOAD PROXIMATE 75MM BLUE (ENDOMECHANICALS) ×2 IMPLANT
RELOAD STAPLE 75 3.8 BLU REG (ENDOMECHANICALS) IMPLANT
RETRACTOR WND ALEXIS 18 MED (MISCELLANEOUS) IMPLANT
RETRACTOR WND ALEXIS 25 LRG (MISCELLANEOUS) IMPLANT
RTRCTR WOUND ALEXIS 18CM MED (MISCELLANEOUS) ×1 IMPLANT
RTRCTR WOUND ALEXIS 25CM LRG (MISCELLANEOUS) ×1 IMPLANT
STAPLER GUN LINEAR PROX 60 (STAPLE) IMPLANT
STAPLER PROXIMATE 75MM BLUE (STAPLE) IMPLANT
STAPLER SKIN PROX WIDE 3.9 (STAPLE) ×1 IMPLANT
SUCTION POOLE HANDLE (INSTRUMENTS) ×2 IMPLANT
SUT ETHILON 3 0 PS 1 (SUTURE) IMPLANT
SUT NOVA 1 T20/GS 25DT (SUTURE) IMPLANT
SUT PDS AB 1 CT1 27 (SUTURE) IMPLANT
SUT PDS AB 1 TP1 96 (SUTURE) IMPLANT
SUT SILK 2 0 SH CR/8 (SUTURE) ×1 IMPLANT
SUT SILK 2-0 18XBRD TIE 12 (SUTURE) ×1 IMPLANT
SUT SILK 3 0 SH CR/8 (SUTURE) ×1 IMPLANT
SUT SILK 3-0 18XBRD TIE 12 (SUTURE) ×1 IMPLANT
SUT VIC AB 2-0 SH 18 (SUTURE) IMPLANT
SUT VIC AB 3-0 SH 18 (SUTURE) IMPLANT
TOWEL OR 17X26 10 PK STRL BLUE (TOWEL DISPOSABLE) ×2 IMPLANT
TRAY FOLEY MTR SLVR 14FR STAT (SET/KITS/TRAYS/PACK) IMPLANT
TRAY FOLEY MTR SLVR 16FR STAT (SET/KITS/TRAYS/PACK) IMPLANT

## 2024-02-01 NOTE — Plan of Care (Signed)

## 2024-02-01 NOTE — Progress Notes (Signed)
 PROGRESS NOTE    Harry Williams  BMW:[id number] DOB: 03-22-1981 DOA: 01/30/2024 PCP: Harry Banana, MD  Chief Complaint  Patient presents with   Emesis   Abdominal Pain    Brief Narrative:   43 yo with hx malignant neoplasm of the tonsillar fossa s/p chemoradiation, oral mucositis due to radiation therapy, dysphagia s/p PEG tube.  FAP s/p total colectomy in 2016, R internal jugular thrombus and multiple other medical issues here with nausea, vomiting, abdominal pain and found to have Harry Williams small bowel obstruction.   Assessment & Plan:   Principal Problem:   SBO (small bowel obstruction) (HCC) Active Problems:   Malignant neoplasm of tonsillar fossa (HCC)   AKI (acute kidney injury) (HCC)   Hyponatremia   Hyperkalemia   Hypochloremia  High Grade Small Bowel Obstruction S/p Total Colectomy for FAP in 2016 S/p PEG Appreciate general surgery's assistance - plan for surgery 3/5. Heparin gtt on hold.  GT to LIS, antiemetics, bowel rest.    Acute Kidney Injury Continue IVF, additional workup if not improving CT without hydro   Right internal jugular Thrombus related to [facility]  Hasn't been on anticoagulation prior to admission due to issues with cost Needs anticoagulation rx at discharge, will need continued close follow up outpatient Will resume anticoagulation when ok with surgery Consider repeat CT with contrast to reassess clot burden, Dr. Had planned for this in March  Malignant Neoplasm of Tonsillar Fossa Referred to oncology in Oct 2024 - treated with chemoradiation (received 4 doses of weekly cisplatin, rest held due to neutropenia and patient preference). Following with Dr. Arlana Williams from oncology  T wave inversion Leads II, III, aVF and V5, V6 Negative troponin Noted, consider echo   Asthma Not in exacerbation    DVT prophylaxis: heparin gtt (now on hold with surgery today) Code Status: full Family Communication: wife on speakerphone while I was in  room Disposition:   Status is: Inpatient Remains inpatient appropriate because: need for ongoing care, surgery   Consultants:  surgery  Procedures:  none  Antimicrobials:  Anti-infectives (From admission, onward)    None       Subjective: No complaints at this time  Objective: Vitals:   01/31/24 1520 01/31/24 2017 02/01/24 0000 02/01/24 0325  BP: 129/89 (!) 127/91 123/88 (!) 125/91  Pulse: 85 92 95 91  Resp: 12 16 16 16   Temp: 98.5 F (36.9 C) (!) 97.5 F (36.4 C) 98.1 F (36.7 C) 97.9 F (36.6 C)  TempSrc: Oral     SpO2: 95% 98% 95% 95%  Weight:      Height:        Intake/Output Summary (Last 24 hours) at 02/01/2024 0920 Last data filed at 02/01/2024 2725 Gross per 24 hour  Intake 2267.2 ml  Output 1900 ml  Net 367.2 ml   Filed Weights   01/30/24 1718 01/30/24 1719  Weight: 70.3 kg 70.3 kg    Examination:  General exam: Appears calm and comfortable  Respiratory system: unlabored Cardiovascular system: RRR Gastrointestinal system: G tube, bilious output, soft, nontender on exam Central nervous system: Alert and oriented. No focal neurological deficits. Extremities: no LEE   Data Reviewed: I have personally reviewed following labs and imaging studies  CBC: Recent Labs  Lab 01/25/24 2250 01/30/24 1407 01/30/24 1800 01/31/24 0455 02/01/24 0553  WBC 3.4* 5.6 5.5 5.0 5.0  NEUTROABS  --   --  4.1  --   --   HGB 13.1 15.8 15.5 14.0 13.8  HCT 39.4  45.5 45.5 41.1 43.0  MCV 93.4 87.8 89.9 90.1 94.7  PLT 168 328 336 301 322    Basic Metabolic Panel: Recent Labs  Lab 01/25/24 2250 01/30/24 1407 01/30/24 1800 01/31/24 0455  NA 135 131* 128* 132*  K 4.1 5.7* 5.7* 4.4  CL 98 85* 85* 85*  CO2 23 33* 29 32  GLUCOSE 104* 163* 147* 115*  BUN 10 28* 30* 33*  CREATININE 1.10 1.74* 1.79* 1.58*  CALCIUM 9.3 10.3 9.5 9.5    GFR: Estimated Creatinine Clearance: 59.5 mL/min (Harry Williams) (by C-G formula based on SCr of 1.58 mg/dL (H)).  Liver Function  Tests: Recent Labs  Lab 01/25/24 2250 01/30/24 1407 01/30/24 1800  AST 17 11* 16  ALT 16 9 15   ALKPHOS 75 77 73  BILITOT 1.0 0.8 1.0  PROT 7.3 8.3* 8.6*  ALBUMIN 3.9 4.9 4.3    CBG: Recent Labs  Lab 01/31/24 0046  GLUCAP 194*     No results found for this or any previous visit (from the past 240 hours).       Radiology Studies: DG Abd Portable 1V-Small Bowel Obstruction Protocol-initial, 8 hr delay Result Date: 01/31/2024 CLINICAL DATA:  Bowel obstruction 8 hour delay EXAM: PORTABLE ABDOMEN - 1 VIEW COMPARISON:  CT 01/30/2024 FINDINGS: Contrast within the urinary bladder. Severe small bowel distension up to 6.5 cm consistent with high-grade bowel obstruction, configuration similar to scout image from CT on 01/30/2024. Question dilute contrast within dilated left abdominal small bowel but definitely no colon contrast is visible IMPRESSION: Persistent dilatation of small bowel consistent with high-grade bowel obstruction. Question dilute contrast within dilated left abdominal small bowel but no colon contrast Electronically Signed   By: Harry Williams M.D.   On: 01/31/2024 23:04   CT ABDOMEN PELVIS W CONTRAST Result Date: 01/30/2024 CLINICAL DATA:  Abdominal pain.  Bowel obstruction suspected. EXAM: CT ABDOMEN AND PELVIS WITH CONTRAST TECHNIQUE: Multidetector CT imaging of the abdomen and pelvis was performed using the standard protocol following bolus administration of intravenous contrast. RADIATION DOSE REDUCTION: This exam was performed according to the departmental dose-optimization program which includes automated exposure control, adjustment of the mA and/or kV according to patient size and/or use of iterative reconstruction technique. CONTRAST:  OMNIPAQUE IOHEXOL 300 MG/ML  SOLN COMPARISON:  CT 01/26/2024 FINDINGS: Lower chest: No acute abnormality. Hepatobiliary: No acute abnormality. Pancreas: Unremarkable. Spleen: Unremarkable. Adrenals/Urinary Tract: Within normal  limits. Stomach/Bowel: Percutaneous gastrostomy tube in unchanged position. Postoperative changes of colectomy with ileoanal anastomosis. Diffuse dilation of the small bowel. There is an abrupt transition point in the right central abdomen (series 8/image 54-67). There is an adjacent transition point downstream from the original transition point with dilation of the small bowel between the transition points (series 8/image 56). Findings are compatible with closed loop obstruction. No signs of ischemic bowel. Vascular/Lymphatic: No significant vascular findings are present. No enlarged abdominal or pelvic lymph nodes. Reproductive: Unremarkable. Other: Small volume free fluid in the pelvis. No free intraperitoneal air. Musculoskeletal: No acute fracture. 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 IMPRESSION: High-grade small bowel closed loop obstruction in the right hemiabdomen. No evidence of ischemia. Critical Value/emergent results were called by telephone at the time of interpretation on 01/30/2024 at 11:10 pm to provider Riki Sheer , who verbally acknowledged these results. Electronically Signed   By: Minerva Fester M.D.   On: 01/30/2024 23:11        Scheduled Meds: Continuous Infusions:  sodium chloride 100 mL/hr at 01/31/24 2238  LOS: 1 day    Time spent: over 30 min    [name], MD [facility]   To contact the attending provider between 7A-7P or the covering provider during after hours 7P-7A, please log into the web site [email or web address] and access using universal [place] password for that web site. If you do not have the password, please call the hospital operator.  02/01/2024, 9:20 AM

## 2024-02-01 NOTE — Plan of Care (Signed)
  Problem: Education: Goal: Knowledge of General Education information will improve Description: Including pain rating scale, medication(s)/side effects and non-pharmacologic comfort measures Outcome: Progressing   Problem: Clinical Measurements: Goal: Ability to maintain clinical measurements within normal limits will improve Outcome: Progressing Goal: Will remain free from infection Outcome: Progressing Goal: Diagnostic test results will improve Outcome: Progressing Goal: Cardiovascular complication will be avoided Outcome: Progressing   Problem: Activity: Goal: Risk for activity intolerance will decrease Outcome: Progressing   Problem: Nutrition: Goal: Adequate nutrition will be maintained Outcome: Progressing   Problem: Coping: Goal: Level of anxiety will decrease Outcome: Progressing   Problem: Elimination: Goal: Will not experience complications related to bowel motility Outcome: Progressing Goal: Will not experience complications related to urinary retention Outcome: Progressing   Problem: Pain Managment: Goal: General experience of comfort will improve and/or be controlled Outcome: Progressing   Problem: Safety: Goal: Ability to remain free from injury will improve Outcome: Progressing   Problem: Skin Integrity: Goal: Risk for impaired skin integrity will decrease Outcome: Progressing

## 2024-02-01 NOTE — Progress Notes (Signed)
 Subjective No acute events. Feels the same as yesterday. Denies any flatus nor BM. G tube to wall suction - continues to put out fair amount of bilious effluent  Objective: Vital signs in last 24 hours: Temp:  [97.5 F (36.4 C)-98.5 F (36.9 C)] 97.9 F (36.6 C) (03/05 0325) Pulse Rate:  [85-95] 91 (03/05 0325) Resp:  [12-16] 16 (03/05 0325) BP: (122-129)/(88-95) 125/91 (03/05 0325) SpO2:  [92 %-98 %] 95 % (03/05 0325)    Intake/Output from previous day: 03/04 0701 - 03/05 0700 In: 2267.2 [I.V.:2267.2] Out: 1900 [Urine:300] Intake/Output this shift: No intake/output data recorded.  Gen: NAD, comfortable CV: RRR Pulm: Normal work of breathing Abd: Soft, not significantly tender but is uncomfortable; moderate distention Ext: SCDs in place  Lab Results: CBC  Recent Labs    01/31/24 0455 02/01/24 0553  WBC 5.0 5.0  HGB 14.0 13.8  HCT 41.1 43.0  PLT 301 322   BMET Recent Labs    01/30/24 1800 01/31/24 0455  NA 128* 132*  K 5.7* 4.4  CL 85* 85*  CO2 29 32  GLUCOSE 147* 115*  BUN 30* 33*  CREATININE 1.79* 1.58*  CALCIUM 9.5 9.5   PT/INR Recent Labs    01/31/24 0455  LABPROT 14.1  INR 1.1   ABG No results for input(s): "PHART", "HCO3" in the last 72 hours.  Invalid input(s): "PCO2", "PO2"  Studies/Results:  Anti-infectives: Anti-infectives (From admission, onward)    None        Assessment/Plan: Patient Active Problem List   Diagnosis Date Noted   SBO (small bowel obstruction) (HCC) 01/31/2024   AKI (acute kidney injury) (HCC) 01/31/2024   Hyponatremia 01/31/2024   Hyperkalemia 01/31/2024   Hypochloremia 01/31/2024   Internal jugular (IJ) vein thromboembolism, acute, right (HCC) 12/16/2023   Oral mucositis due to radiation therapy 11/02/2023   Leukopenia due to antineoplastic chemotherapy (HCC) 10/26/2023   Cancer associated pain 10/26/2023   Visual disturbances 10/05/2023   Hiccups 10/05/2023   GERD without esophagitis 10/05/2023    Port-A-Cath in place 10/04/2023   Bleeding hemorrhoid 09/29/2023   Familial adenomatous polyposis 09/15/2023   Malignant neoplasm of tonsillar fossa (HCC) 09/14/2023   Submandibular swelling 06/16/2023   Rotator cuff impingement syndrome, left 07/02/2022   Insomnia 04/01/2022   Vasovagal syncope 02/11/2022   Cervical spondylosis 11/13/2021   Healthcare maintenance 09/04/2021   Anxiety and depression 07/24/2021   Other spondylosis with radiculopathy, lumbar region 07/24/2021   Adenomatous polyp 04/04/2015    SBO, ? 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  He does have a J-pouch and has had no return of bowel function.  Typically has upwards of 7 bowel movements a day.  If his obstruction resolving, would anticipate rapid return of bowel function given his anatomy.  With the CT findings and his persistent findings on x-ray and additionally the concern being raised for potential closed-loop obstruction, we did discuss that since things have not rapidly turned around, would likely be best to proceed with surgery.  -The anatomy and physiology of the GI tract was discussed with the patient. The pathophysiology of small bowel obstruction was discussed as well. -We have discussed surgery, exploratory laparotomy, lysis of adhesions, possible small bowel  resection if indicated -The planned procedure, material risks (including, but not limited to, pain, bleeding, infection, scarring, need for blood transfusion, damage to surrounding structures- blood vessels/nerves/viscus/organs, damage to ureter, urine leak, leak from anastomosis if one is created, need for additional procedures, worsening of pre-existing medical  conditions, chronic diarrhea, hernia, recurrence, pneumonia, heart attack, stroke, death) benefits and alternatives to surgery were discussed at length. The patient's questions were answered to his satisfaction, he voiced understanding and elected to proceed with surgery. Additionally, we discussed typical postoperative expectations and the recovery process.   LOS: 1 day   I spent a total of 50 minutes in both face-to-face and non-face-to-face activities, excluding procedures performed, for this visit on the date of this encounter.  Marin Olp, MD [facility], [facility]

## 2024-02-01 NOTE — Op Note (Signed)
 02/01/2024  1:01 PM  PATIENT:  Harry Williams  43 y.o. male  Patient Care Team: [name], MD as PCP - General (Family Medicine) Noe Gens, MD as Referring Physician (Otolaryngology) [name], MD as Attending Physician (Radiation Oncology) [name], [name], RN as Oncology Nurse Navigator Pasam, [name], MD as Consulting Physician (Oncology)  PRE-OPERATIVE DIAGNOSIS: Small bowel obstruction  POST-OPERATIVE DIAGNOSIS: Same  PROCEDURE:  Exploratory laparotomy with small bowel resection Repair of small bowel serosa x 1 Lysis of adhesions x 100 minutes  SURGEON:  Stephanie Coup. Cliffton Asters, MD  ASSISTANT:  Phylliss Blakes MD Hosie Spangle PA-C  ANESTHESIA:   general  COUNTS:  Sponge, needle and instrument counts were reported correct x2 at the conclusion of the operation.  EBL: 50 mL  DRAINS: None  SPECIMEN: Segment of jejunum  COMPLICATIONS: None  FINDINGS: No adhesions to abdominal wall but fairly dense adhesions of small bowel to what is likely mesenteric cut edges from his mesocolon from his prior colectomy.  Fairly tedious adhesiolysis was necessary to facilitate identification of bowel anatomy.  Additionally, there appears to have been some sort of twist around one of his adhesions versus an internal hernia created by an adhesive band.  One dense adhesion did involve the wall of the small intestine which was intimately associated with it.  At this location, there was 1 enterotomy that necessitated a small bowel resection.  1 additional serosal repair was done on the proximal jejunum.  DISPOSITION: PACU in satisfactory condition  DESCRIPTION: The patient was identified in preop holding and taken to the OR where he was placed on the operating room table. SCDs were placed. General endotracheal anesthesia was induced without difficulty.  A Foley catheter was placed.  Pressure points were padded.  He was then prepped and draped in the usual sterile  fashion. A surgical timeout was performed indicating the correct patient, procedure, positioning and need for preoperative antibiotics.   We began by making a midline incision in the supraumbilical location.  Subcutaneous tissue divided electrocautery.  The fascia incised the midline.  The peritoneal cavity was cautiously entered bluntly.  An Alexis wound protector was placed.  Serosanguineous peritoneal fluid is identified.  There is no significant adhesive burden to the abdominal wall.  There are multiple dilated loops of bowel some which are quite dilated in nature.  There are also identified distal loops of small bowel that are clearly decompressed.  All bowel is pink and viable in appearance.  The bowel was eviscerated to the degree possible but there are multiple adhesions identified in his abdomen primarily to the retroperitoneum/area where we would expect him to have had his mesocolon/cut edge of the mesentery from his prior total colectomy.  The bulk of the adhesions are in the central abdomen and in the right upper quadrant area.  We are able to identify the duodenum and protect this free of injury.  Bulk of his adhesions involve the jejunum.  These were all lysed to the degree necessary in order to facilitate identification of anatomy and the obstruction.  It appears that he had some sort of internal hernia related to some of his adhesions which was causing a high-grade level of obstruction.  There also evident adhesions involving remnant omentum that were lysed.  After lysing adhesions in the central abdomen and right upper quadrant, it was identified that one of the densely involved loops had a small enterotomy.  This is felt to be inherent to the nature of the procedure  given the tenacity of his adhesions.  This was controlled with a single stitch while we evaluated the rest of the bowel.  We began by running the small intestine from the ligament of Treitz/D4 all the way down to the J-pouch.  This  was done x 2.  There was 1 small serosal tear noted on the proximal jejunum that was repaired using 3-0 silk Lembert stitches.  The repair is inspected and complete.  It is widely patent.  Distal to this at the level of the mid to distal jejunum, there was the evident enterotomy that had been temporized with a stitch.  This was felt not to be amenable to any sort of primary repair and we felt that a resection and anastomosis would result in a better quality repair for him.  Windows are created the mesentery and either side of the perforation.  The small bowel was then divided these locations using 75 mm GIA blue load deployed.  The intervening mesentery was ligated and divided using a hand-held LigaSure device.  The cut edge of the mesentery is inspected noted to be hemostatic.  The small bowel segment was passed off.  Attention was then turned at the enteroenterostomy.  Antimesenteric corners of each respective staple line are trimmed.  A pull tip suction device was then utilized to decompress the small bowel both proximally distally.  We evacuated approximately 2 L of succus.  A 75 mm GIA blue stapler was used to create the enteroenterostomy.  The staple lines inspected found to have well-formed staples which are hemostatic.  Found to have well-formed staples which are hemostatic.  The common enterotomy is then elevated with Allis clamps.  This is closed using a TA 60 blue load stapler.  Staple line is inspected and found to be complete with intact staples.  The corners of each respective TA staple line are dunked using 2-0 silk sutures.  The anastomosis is palpated and found to be 3 fingerbreadths in diameter and widely patent.  An apex stitch of 3-0 silk was placed.  The mesenteric defect was obliterated using 3-0 silk suture.  The small bowel was then run a third time.  There are no other evidence flow-limiting adhesions or any evident injuries.  The abdomen is then irrigated copiously with warm sterile  saline.  Hemostasis is verified.  The omentum was brought down back over the midline.  The wound protector was removed.  The fascia is then closed using 2 running #1 PDS sutures.  All sponge, needle, instrument counts were reported correct x 2.  The wound was washed and dried.  Skin staples are placed.  A dressing consisting of honeycomb was placed over this.  He is then awakened from anesthesia, extubated, and transferred to a stretcher for transport to recovery in satisfactory condition.

## 2024-02-01 NOTE — Anesthesia Preprocedure Evaluation (Addendum)
 Anesthesia Evaluation  Patient identified by MRN, date of birth, ID band Patient awake    Reviewed: Allergy & Precautions, NPO status , Patient's Chart, lab work & pertinent test results  Airway Mallampati: II  TM Distance: >3 FB Neck ROM: Limited    Dental no notable dental hx. (+) Dental Advisory Given, Teeth Intact   Pulmonary asthma    Pulmonary exam normal breath sounds clear to auscultation       Cardiovascular negative cardio ROS Normal cardiovascular exam Rhythm:Regular Rate:Normal     Neuro/Psych  PSYCHIATRIC DISORDERS Anxiety Depression    negative neurological ROS     GI/Hepatic Neg liver ROS,GERD  ,,  Endo/Other  negative endocrine ROS    Renal/GU Renal disease     Musculoskeletal  (+) Arthritis ,    Abdominal   Peds  Hematology negative hematology ROS (+)   Anesthesia Other Findings   Reproductive/Obstetrics                             Anesthesia Physical Anesthesia Plan  ASA: 3 and emergent  Anesthesia Plan: General   Post-op Pain Management: Ofirmev IV (intra-op)*   Induction: Intravenous, Rapid sequence and Cricoid pressure planned  PONV Risk Score and Plan: 4 or greater and Ondansetron, Dexamethasone, Treatment may vary due to age or medical condition and Midazolam  Airway Management Planned: Video Laryngoscope Planned and Oral ETT  Additional Equipment: None  Intra-op Plan:   Post-operative Plan: Extubation in OR  Informed Consent: I have reviewed the patients History and Physical, chart, labs and discussed the procedure including the risks, benefits and alternatives for the proposed anesthesia with the patient or authorized representative who has indicated his/her understanding and acceptance.     Dental advisory given  Plan Discussed with: CRNA  Anesthesia Plan Comments:         Anesthesia Quick Evaluation

## 2024-02-01 NOTE — Progress Notes (Signed)
 PHARMACY - ANTICOAGULATION CONSULT NOTE  Pharmacy Consult for heparin Indication: hx of internal jugular thrombus  No Known Allergies  Patient Measurements: Height: 5\' 8"  (172.7 cm) Weight: 70.3 kg (155 lb) IBW/kg (Calculated) : 68.4 Heparin Dosing Weight: 70.3kg  Vital Signs: Temp: 97.2 F (36.2 C) (03/05 1305) Temp Source: Oral (03/05 1009) BP: 127/89 (03/05 1330) Pulse Rate: 85 (03/05 1336)  Labs: Recent Labs    01/30/24 1407 01/30/24 1800 01/30/24 1800 01/31/24 0455 01/31/24 1122 01/31/24 1809 02/01/24 0553  HGB 15.8 15.5  --  14.0  --   --  13.8  HCT 45.5 45.5  --  41.1  --   --  43.0  PLT 328 336  --  301  --   --  322  APTT  --   --   --  25  --   --   --   LABPROT  --   --   --  14.1  --   --   --   INR  --   --   --  1.1  --   --   --   HEPARINUNFRC  --   --    < > <0.10* 0.54 0.39 0.51  CREATININE 1.74* 1.79*  --  1.58*  --   --   --   TROPONINIHS  --   --   --  4  --   --   --    < > = values in this interval not displayed.    Estimated Creatinine Clearance: 59.5 mL/min (A) (by C-G formula based on SCr of 1.58 mg/dL (H)).   Assessment: 43 year old male comes in with chief complaint of vomiting, abdominal pain. Pt has previous history of colectomy and J-pouch placement at [facility] because of multiple polyps in his colon.  He also has history of throat cancer, for which he finished his given radiation sometime in November.  Patient also has known history of clot close to his port, Pharmacy consulted to dose heparin drip.  Today, 02/01/24 CBC WNL this AM; stable  HL this AM is 0.51, therapeutic  Admission AKI resolving (baseline SCr < 1.0) No reported infusion or bleeding issues per RN  Goal of Therapy:  Heparin level 0.3-0.7 units/ml Monitor platelets by anticoagulation protocol: Yes   Plan:  Pt to OR On 3/5.  Per secure chat with Hosie Spangle PA, . "he required a small bowel resection (jejunum). there was not a ton of blood loss but Dr. Cliffton Asters would  like heparin to be held until tomorrow and we can consider resuming tomorrow. "  Adalberto Cole, PharmD, BCPS 02/01/2024 1:49 PM

## 2024-02-01 NOTE — Progress Notes (Addendum)
 PHARMACY - ANTICOAGULATION CONSULT NOTE  Pharmacy Consult for heparin Indication: hx of internal jugular thrombus  No Known Allergies  Patient Measurements: Height: 5\' 8"  (172.7 cm) Weight: 70.3 kg (155 lb) IBW/kg (Calculated) : 68.4 Heparin Dosing Weight: 70.3kg  Vital Signs: Temp: 97.9 F (36.6 C) (03/05 0325) BP: 125/91 (03/05 0325) Pulse Rate: 91 (03/05 0325)  Labs: Recent Labs    01/30/24 1407 01/30/24 1800 01/30/24 1800 01/31/24 0455 01/31/24 1122 01/31/24 1809 02/01/24 0553  HGB 15.8 15.5  --  14.0  --   --  13.8  HCT 45.5 45.5  --  41.1  --   --  43.0  PLT 328 336  --  301  --   --  322  APTT  --   --   --  25  --   --   --   LABPROT  --   --   --  14.1  --   --   --   INR  --   --   --  1.1  --   --   --   HEPARINUNFRC  --   --    < > <0.10* 0.54 0.39 0.51  CREATININE 1.74* 1.79*  --  1.58*  --   --   --   TROPONINIHS  --   --   --  4  --   --   --    < > = values in this interval not displayed.    Estimated Creatinine Clearance: 59.5 mL/min (A) (by C-G formula based on SCr of 1.58 mg/dL (H)).   Assessment: 43 year old male comes in with chief complaint of vomiting, abdominal pain. Pt has previous history of colectomy and J-pouch placement at [facility] because of multiple polyps in his colon.  He also has history of throat cancer, for which he finished his given radiation sometime in November.  Patient also has known history of clot close to his port, Pharmacy consulted to dose heparin drip.  Today, 02/01/24 CBC WNL this AM; stable  HL this AM is 0.51, therapeutic  Admission AKI resolving (baseline SCr < 1.0) No reported infusion or bleeding issues per RN  Goal of Therapy:  Heparin level 0.3-0.7 units/ml Monitor platelets by anticoagulation protocol: Yes   Plan:  Continue  IV heparin at  1200 units/hr Monitor daily heparin level, CBC, signs/symptoms of bleeding F/U any plans/need to hold IV heparin for surgery    Adalberto Cole, PharmD,  BCPS 02/01/2024 8:09 AM   ADDENDUM  - Received messaged from DR. White to hold heparin in anticipation of surgery on 3/5. Heparin has now been stopped    Adalberto Cole, PharmD, BCPS 02/01/2024 8:42 AM

## 2024-02-01 NOTE — Anesthesia Procedure Notes (Signed)
 Procedure Name: Intubation Date/Time: 02/01/2024 11:09 AM  Performed by: Randa Evens, CRNAPre-anesthesia Checklist: Patient identified, Emergency Drugs available, Suction available and Patient being monitored Patient Re-evaluated:Patient Re-evaluated prior to induction Oxygen Delivery Method: Circle System Utilized Preoxygenation: Pre-oxygenation with 100% oxygen Induction Type: IV induction Ventilation: Mask ventilation without difficulty Laryngoscope Size: Mac and 4 Grade View: Grade I Tube type: Oral Tube size: 7.5 mm Number of attempts: 1 Airway Equipment and Method: Stylet and Oral airway Placement Confirmation: ETT inserted through vocal cords under direct vision, positive ETCO2 and breath sounds checked- equal and bilateral Secured at: 22 cm Tube secured with: Tape Dental Injury: Teeth and Oropharynx as per pre-operative assessment

## 2024-02-01 NOTE — Transfer of Care (Signed)
 Immediate Anesthesia Transfer of Care Note  Patient: Harry Williams  Procedure(s) Performed: EXPLORATORY LAPAROTOMY, LYSIS OF ADHESIONS, SMALL BOWEL RESECTION (Abdomen)  Patient Location: PACU  Anesthesia Type:General  Level of Consciousness: sedated  Airway & Oxygen Therapy: Patient Spontanous Breathing  Post-op Assessment: Report given to RN  Post vital signs: Reviewed and stable  Last Vitals:  Vitals Value Taken Time  BP 123/88 02/01/24 1305  Temp 36.2 C 02/01/24 1305  Pulse 79 02/01/24 1312  Resp 11 02/01/24 1312  SpO2 100 % 02/01/24 1312  Vitals shown include unfiled device data.  Last Pain:  Vitals:   02/01/24 1017  TempSrc:   PainSc: 0-No pain         Complications: No notable events documented.

## 2024-02-02 ENCOUNTER — Encounter (HOSPITAL_COMMUNITY): Payer: Self-pay | Admitting: Surgery

## 2024-02-02 DIAGNOSIS — K56609 Unspecified intestinal obstruction, unspecified as to partial versus complete obstruction: Secondary | ICD-10-CM | POA: Diagnosis not present

## 2024-02-02 LAB — CBC
HCT: 42.3 % (ref 39.0–52.0)
Hemoglobin: 13.6 g/dL (ref 13.0–17.0)
MCH: 30.4 pg (ref 26.0–34.0)
MCHC: 32.2 g/dL (ref 30.0–36.0)
MCV: 94.4 fL (ref 80.0–100.0)
Platelets: 323 10*3/uL (ref 150–400)
RBC: 4.48 MIL/uL (ref 4.22–5.81)
RDW: 12.7 % (ref 11.5–15.5)
WBC: 10.9 10*3/uL — ABNORMAL HIGH (ref 4.0–10.5)
nRBC: 0 % (ref 0.0–0.2)

## 2024-02-02 LAB — BASIC METABOLIC PANEL
Anion gap: 11 (ref 5–15)
BUN: 29 mg/dL — ABNORMAL HIGH (ref 6–20)
CO2: 30 mmol/L (ref 22–32)
Calcium: 8.3 mg/dL — ABNORMAL LOW (ref 8.9–10.3)
Chloride: 96 mmol/L — ABNORMAL LOW (ref 98–111)
Creatinine, Ser: 1.37 mg/dL — ABNORMAL HIGH (ref 0.61–1.24)
GFR, Estimated: >60 mL/min (ref >60)
Glucose, Bld: 136 mg/dL — ABNORMAL HIGH (ref 70–99)
Potassium: 4.4 mmol/L (ref 3.5–5.1)
Sodium: 137 mmol/L (ref 135–145)

## 2024-02-02 MED ORDER — CHLORHEXIDINE GLUCONATE CLOTH 2 % EX PADS
6.0000 | MEDICATED_PAD | Freq: Every day | CUTANEOUS | Status: DC
  Administered 2024-02-02 – 2024-02-14 (×13): 6 via TOPICAL

## 2024-02-02 MED ORDER — DEXTROSE IN LACTATED RINGERS 5 % IV SOLN
INTRAVENOUS | Status: AC
  Administered 2024-02-02 – 2024-02-03 (×2): 100 mL/h via INTRAVENOUS

## 2024-02-02 MED ORDER — LACTATED RINGERS IV BOLUS
500.0000 mL | Freq: Once | INTRAVENOUS | Status: AC
  Administered 2024-02-02: 500 mL via INTRAVENOUS

## 2024-02-02 MED ORDER — HEPARIN SODIUM (PORCINE) 5000 UNIT/ML IJ SOLN
5000.0000 [IU] | Freq: Three times a day (TID) | INTRAMUSCULAR | Status: DC
  Administered 2024-02-02 – 2024-02-04 (×7): 5000 [IU] via SUBCUTANEOUS
  Filled 2024-02-02 (×7): qty 1

## 2024-02-02 NOTE — Anesthesia Postprocedure Evaluation (Signed)
 Anesthesia Post Note  Patient: Harry Williams  Procedure(s) Performed: EXPLORATORY LAPAROTOMY, LYSIS OF ADHESIONS, SMALL BOWEL RESECTION (Abdomen)     Patient location during evaluation: PACU Anesthesia Type: General Level of consciousness: sedated and patient cooperative Pain management: pain level controlled Vital Signs Assessment: post-procedure vital signs reviewed and stable Respiratory status: spontaneous breathing Cardiovascular status: stable Anesthetic complications: no   No notable events documented.  Last Vitals:  Vitals:   02/02/24 1649 02/02/24 1806  BP:  135/84  Pulse:  88  Resp: 18 16  Temp:    SpO2: 99% 100%    Last Pain:  Vitals:   02/02/24 1757  TempSrc:   PainSc: 5                  Lewie Loron

## 2024-02-02 NOTE — Progress Notes (Signed)
   02/02/24 1147  TOC Brief Assessment  Insurance and Status Reviewed  Patient has primary care physician Yes  Home environment has been reviewed Home w. spouse  Prior level of function: Independent  Prior/Current Home Services No current home services  Social Drivers of Health Review SDOH reviewed no interventions necessary  Readmission risk has been reviewed Yes  Transition of care needs no transition of care needs at this time

## 2024-02-02 NOTE — Progress Notes (Signed)
 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  FAP s/p total colectomy in 2016, R internal jugular thrombus and multiple other medical issues here with nausea, vomiting, abdominal pain and found to have Arianny Pun small bowel obstruction.   Assessment & Plan:   Principal Problem:   SBO (small bowel obstruction) (HCC) Active Problems:   Malignant neoplasm of tonsillar fossa (HCC)   AKI (acute kidney injury) (HCC)   Hyponatremia   Hyperkalemia   Hypochloremia  High Grade Small Bowel Obstruction S/p Total Colectomy for FAP in 2016 S/p PEG Appreciate general surgery's assistance - s/p ex lap with small bowel resection, repair of small bowel serosa x1, lysis of adhesions. Heparin gtt on hold - will defer resumption to surgery.  Post op care per general surgery GT to LIS, antiemetics, bowel rest.    Acute Kidney Injury Baseline creatinine < 1.0 Continue IVF, additional workup if not improving CT without hydro   Sinus Tachycardia Suspect this is related to pain Will ctm on tele  Right internal jugular Thrombus related to [facility]  Hasn't been on anticoagulation prior to admission due to issues with cost Needs anticoagulation rx at discharge, will need continued close follow up outpatient Will resume anticoagulation when ok with surgery Consider repeat CT with contrast to reassess clot burden, oncology had planned for this in March  Malignant Neoplasm of Tonsillar Fossa Referred to oncology in Oct 2024 - treated with chemoradiation (received 4 doses of weekly cisplatin, rest held due to neutropenia and patient preference). Following with Dr. [name] from oncology  T wave inversion Leads II, III, aVF and  V5, V6 Negative troponin Noted, consider echo   Asthma Not in exacerbation    DVT prophylaxis: heparin gtt (now on hold with surgery today) Code Status: full Family Communication: wife on speakerphone while I was in room Disposition:   Status is: Inpatient Remains inpatient appropriate because: need for ongoing care, surgery   Consultants:  surgery  Procedures:  none  Antimicrobials:  Anti-infectives (From admission, onward)    None       Subjective: Notes 6/10 pain  No flatus  Objective: Vitals:   02/02/24 0444 02/02/24 0456 02/02/24 0820 02/02/24 0850  BP:  (!) 140/87  (!) 129/90  Pulse:  (!) 120  (!) 108  Resp: 14 16 14 18   Temp:  98.2 F (36.8 C)  98 F (36.7 C)  TempSrc:    Oral  SpO2: 95% 100% 95% 100%  Weight:      Height:        Intake/Output Summary (Last 24 hours) at 02/02/2024 0937 Last data filed at 02/02/2024 0644 Gross per 24 hour  Intake 3663.36 ml  Output 1600 ml  Net 2063.36 ml   Filed Weights   01/30/24 1718 01/30/24 1719  Weight: 70.3 kg 70.3 kg    Examination:  General: Appears relatively comfortable considering his surgery yesterday, though is in 6/10 pain. Cardiovascular: RRR Lungs: unlabored Abdomen: flat, midline incision - appropriately tender. Neurological: Alert and oriented 3. Moves all extremities 4 with equal strength. Cranial nerves II through XII grossly intact. Extremities: No clubbing or cyanosis. No edema.   Data Reviewed: I have personally reviewed following labs  and imaging studies  CBC: Recent Labs  Lab 01/30/24 1407 01/30/24 1800 01/31/24 0455 02/01/24 0553 02/02/24 0541  WBC 5.6 5.5 5.0 5.0 10.9*  NEUTROABS  --  4.1  --   --   --   HGB 15.8 15.5 14.0 13.8 13.6  HCT 45.5 45.5 41.1 43.0 42.3  MCV 87.8 89.9 90.1 94.7 94.4  PLT 328 336 301 322 323    Basic Metabolic Panel: Recent Labs  Lab 01/30/24 1407 01/30/24 1800 01/31/24 0455 02/02/24 0541  NA 131* 128* 132* 137  K 5.7* 5.7* 4.4 4.4   CL 85* 85* 85* 96*  CO2 33* 29 32 30  GLUCOSE 163* 147* 115* 136*  BUN 28* 30* 33* 29*  CREATININE 1.74* 1.79* 1.58* 1.37*  CALCIUM 10.3 9.5 9.5 8.3*    GFR: Estimated Creatinine Clearance: 68.6 mL/min (Kramer Hanrahan) (by C-G formula based on SCr of 1.37 mg/dL (H)).  Liver Function Tests: Recent Labs  Lab 01/30/24 1407 01/30/24 1800  AST 11* 16  ALT 9 15  ALKPHOS 77 73  BILITOT 0.8 1.0  PROT 8.3* 8.6*  ALBUMIN 4.9 4.3    CBG: Recent Labs  Lab 01/31/24 0046  GLUCAP 194*     No results found for this or any previous visit (from the past 240 hours).       Radiology Studies: DG Abd Portable 1V Result Date: 02/01/2024 CLINICAL DATA:  Small bowel obstruction. EXAM: PORTABLE ABDOMEN - 1 VIEW COMPARISON:  January 31, 2024.  January 30, 2024. FINDINGS: Continued small bowel dilatation is noted concerning for distal small bowel obstruction. Phleboliths are noted in the pelvis. IMPRESSION: Continued small bowel dilatation concerning for distal small bowel obstruction. Electronically Signed   By: Lupita Raider M.D.   On: 02/01/2024 11:30   DG Abd Portable 1V-Small Bowel Obstruction Protocol-initial, 8 hr delay Result Date: 01/31/2024 CLINICAL DATA:  Bowel obstruction 8 hour delay EXAM: PORTABLE ABDOMEN - 1 VIEW COMPARISON:  CT 01/30/2024 FINDINGS: Contrast within the urinary bladder. Severe small bowel distension up to 6.5 cm consistent with high-grade bowel obstruction, configuration similar to scout image from CT on 01/30/2024. Question dilute contrast within dilated left abdominal small bowel but definitely no colon contrast is visible IMPRESSION: Persistent dilatation of small bowel consistent with high-grade bowel obstruction. Question dilute contrast within dilated left abdominal small bowel but no colon contrast Electronically Signed   By: Jasmine Pang M.D.   On: 01/31/2024 23:04        Scheduled Meds:  acetaminophen  650 mg Oral Q6H   Or   acetaminophen  650 mg Rectal Q6H    HYDROmorphone   Intravenous Q4H   methocarbamol (ROBAXIN) injection  500 mg Intravenous Q8H   Continuous Infusions:     LOS: 2 days    Time spent: over 30 min    [name], MD [facility]   To contact the attending provider between 7A-7P or the covering provider during after hours 7P-7A, please log into the web site [email or web address] and access using universal [place] password for that web site. If you do not have the password, please call the hospital operator.  02/02/2024, 9:37 AM

## 2024-02-02 NOTE — Progress Notes (Signed)
   02/02/24 0353  Assess: MEWS Score  Temp 98.2 F (36.8 C)  BP (!) 137/97  MAP (mmHg) 108  Pulse Rate (!) 120  Resp 16  Level of Consciousness Alert  SpO2 99 %  O2 Device Nasal Cannula  O2 Flow Rate (L/min) 2 L/min  Assess: MEWS Score  MEWS Temp 0  MEWS Systolic 0  MEWS Pulse 2  MEWS RR 0  MEWS LOC 0  MEWS Score 2  MEWS Score Color Yellow  Assess: if the MEWS score is Yellow or Red  Were vital signs accurate and taken at a resting state? Yes  Does the patient meet 2 or more of the SIRS criteria? No  MEWS guidelines implemented  Yes, yellow  Treat  MEWS Interventions Considered administering scheduled or prn medications/treatments as ordered  Take Vital Signs  Increase Vital Sign Frequency  Yellow: Q2hr x1, continue Q4hrs until patient remains green for 12hrs  Escalate  MEWS: Escalate Yellow: Discuss with charge nurse and consider notifying provider and/or RRT  Notify: Charge Nurse/RN  Name of Charge Nurse/RN Notified Nurse, children's  Provider Notification  Provider Name/Title a Soil scientist  Date Provider Notified 02/02/24  Time Provider Notified 0402  Method of Notification Page  Notification Reason Other (Comment)  Provider response No new orders  Date of Provider Response  (no repsonse)  Time of Provider Response  (no response)  Notify: Rapid Response  Name of Rapid Response RN Notified NA  Assess: SIRS CRITERIA  SIRS Temperature  0  SIRS Respirations  0  SIRS Pulse 1  SIRS WBC 0  SIRS Score Sum  1

## 2024-02-02 NOTE — Plan of Care (Signed)
  Problem: Education: Goal: Knowledge of General Education information will improve Description: Including pain rating scale, medication(s)/side effects and non-pharmacologic comfort measures Outcome: Progressing   Problem: Clinical Measurements: Goal: Ability to maintain clinical measurements within normal limits will improve Outcome: Progressing Goal: Will remain free from infection Outcome: Progressing Goal: Diagnostic test results will improve Outcome: Progressing   Problem: Activity: Goal: Risk for activity intolerance will decrease Outcome: Progressing   Problem: Elimination: Goal: Will not experience complications related to bowel motility Outcome: Progressing Goal: Will not experience complications related to urinary retention Outcome: Progressing   Problem: Pain Managment: Goal: General experience of comfort will improve and/or be controlled Outcome: Progressing   Problem: Safety: Goal: Ability to remain free from injury will improve Outcome: Progressing   Problem: Skin Integrity: Goal: Risk for impaired skin integrity will decrease Outcome: Progressing

## 2024-02-02 NOTE — Plan of Care (Signed)
  Problem: Education: Goal: Knowledge of General Education information will improve Description: Including pain rating scale, medication(s)/side effects and non-pharmacologic comfort measures Outcome: Progressing   Problem: Health Behavior/Discharge Planning: Goal: Ability to manage health-related needs will improve Outcome: Progressing   Problem: Clinical Measurements: Goal: Ability to maintain clinical measurements within normal limits will improve Outcome: Progressing Goal: Will remain free from infection Outcome: Progressing Goal: Diagnostic test results will improve Outcome: Progressing Goal: Respiratory complications will improve Outcome: Progressing   Problem: Coping: Goal: Level of anxiety will decrease Outcome: Progressing

## 2024-02-02 NOTE — Progress Notes (Signed)
  Subjective No acute events. Feels much less distended than preop. Denies any flatus nor BM. G tube to wall suction  Objective: Vital signs in last 24 hours: Temp:  [97.2 F (36.2 C)-98.2 F (36.8 C)] 98 F (36.7 C) (03/06 0850) Pulse Rate:  [79-120] 108 (03/06 0850) Resp:  [11-19] 18 (03/06 0850) BP: (117-140)/(82-97) 129/90 (03/06 0850) SpO2:  [95 %-100 %] 100 % (03/06 0850) FiO2 (%):  [21 %] 21 % (03/06 0820)    Intake/Output from previous day: 03/05 0701 - 03/06 0700 In: 3663.4 [I.V.:3513.4; IV Piggyback:150] Out: 1600 [Urine:750; Blood:50] Intake/Output this shift: No intake/output data recorded.  Gen: NAD, comfortable CV: RRR Pulm: Normal work of breathing Abd: Soft, appropriate tenderness, mild distention; midline incision is clean/dry without drainage or erythema. Ext: SCDs in place  Lab Results: CBC  Recent Labs    02/01/24 0553 02/02/24 0541  WBC 5.0 10.9*  HGB 13.8 13.6  HCT 43.0 42.3  PLT 322 323   BMET Recent Labs    01/31/24 0455 02/02/24 0541  NA 132* 137  K 4.4 4.4  CL 85* 96*  CO2 32 30  GLUCOSE 115* 136*  BUN 33* 29*  CREATININE 1.58* 1.37*  CALCIUM 9.5 8.3*   PT/INR Recent Labs    01/31/24 0455  LABPROT 14.1  INR 1.1   ABG No results for input(s): "PHART", "HCO3" in the last 72 hours.  Invalid input(s): "PCO2", "PO2"  Studies/Results:  Anti-infectives: Anti-infectives (From admission, onward)    None        Assessment/Plan: Patient Active Problem List   Diagnosis Date Noted   SBO (small bowel obstruction) (HCC) 01/31/2024   AKI (acute kidney injury) (HCC) 01/31/2024   Hyponatremia 01/31/2024   Hyperkalemia 01/31/2024   Hypochloremia 01/31/2024   Internal jugular (IJ) vein thromboembolism, acute, right (HCC) 12/16/2023   Oral mucositis due to radiation therapy 11/02/2023   Leukopenia due to antineoplastic chemotherapy (HCC) 10/26/2023   Cancer associated pain 10/26/2023   Visual disturbances 10/05/2023    Hiccups 10/05/2023   GERD without esophagitis 10/05/2023   Port-A-Cath in place 10/04/2023   Bleeding hemorrhoid 09/29/2023   Familial adenomatous polyposis 09/15/2023   Malignant neoplasm of tonsillar fossa (HCC) 09/14/2023   Submandibular swelling 06/16/2023   Rotator cuff impingement syndrome, left 07/02/2022   Insomnia 04/01/2022   Vasovagal syncope 02/11/2022   Cervical spondylosis 11/13/2021   Healthcare maintenance 09/04/2021   Anxiety and depression 07/24/2021   Other spondylosis with radiculopathy, lumbar region 07/24/2021   Adenomatous polyp 04/04/2015    SBO, ? Closed loop G tube in place Tonsillar cancer (stage I), s/p chemo x 4 cycles and XRT FAP, s/p TAC with J pouch Internal jugular VTE associated with port  -Doing reasonably well.  We spent time reviewing his procedure, findings, and plans. - Ambulate 5 times per day - Continue G-tube to low intermittent wall suction until reliable return of bowel function - Would not restart heparin drip today.  If hemoglobin remains stable tomorrow may consider restarting at that time without boluses. - Will give 500 cc bolus today given losses including upwards of 2 L removed from the GI tract yesterday - Maintenance IV fluid per primary - Awaiting return of bowel function prior to reinitiating diet  PPX: SCDs; ok for subQ heparin today   LOS: 2 days    Marin Olp, MD [facility]

## 2024-02-02 NOTE — Progress Notes (Signed)
   02/02/24 0456  Assess: MEWS Score  Temp 98.2 F (36.8 C)  BP (!) 140/87  MAP (mmHg) 102  Pulse Rate (!) 120  Resp 16  SpO2 100 %  O2 Device Nasal Cannula  O2 Flow Rate (L/min) 2 L/min  Assess: MEWS Score  MEWS Temp 0  MEWS Systolic 0  MEWS Pulse 2  MEWS RR 0  MEWS LOC 0  MEWS Score 2  MEWS Score Color Yellow  Assess: if the MEWS score is Yellow or Red  Were vital signs accurate and taken at a resting state? Yes  Does the patient meet 2 or more of the SIRS criteria? No  MEWS guidelines implemented  Yes, yellow  Treat  MEWS Interventions Considered administering scheduled or prn medications/treatments as ordered  Take Vital Signs  Increase Vital Sign Frequency  Yellow: Q2hr x1, continue Q4hrs until patient remains green for 12hrs  Escalate  MEWS: Escalate Yellow: Discuss with charge nurse and consider notifying provider and/or RRT  Notify: Charge Nurse/RN  Name of Charge Nurse/RN Notified Nurse, children's  Provider Notification  Provider Name/Title a., Virgel Manifold NP  Date Provider Notified 02/02/24  Time Provider Notified [phone]  Method of Notification Page  Notification Reason Other (Comment) (Yellow MEWS)  Provider response No new orders  Notify: Rapid Response  Name of Rapid Response RN Notified NA  Assess: SIRS CRITERIA  SIRS Temperature  0  SIRS Respirations  0  SIRS Pulse 1  SIRS WBC 0  SIRS Score Sum  1   No new orders, pt remains in pain 7/10. Continue to monitor

## 2024-02-03 DIAGNOSIS — K56609 Unspecified intestinal obstruction, unspecified as to partial versus complete obstruction: Secondary | ICD-10-CM | POA: Diagnosis not present

## 2024-02-03 LAB — CBC WITH DIFFERENTIAL/PLATELET
Abs Immature Granulocytes: 0.06 10*3/uL (ref 0.00–0.07)
Basophils Absolute: 0 10*3/uL (ref 0.0–0.1)
Basophils Relative: 0 %
Eosinophils Absolute: 0.1 10*3/uL (ref 0.0–0.5)
Eosinophils Relative: 2 %
HCT: 33.2 % — ABNORMAL LOW (ref 39.0–52.0)
Hemoglobin: 10.7 g/dL — ABNORMAL LOW (ref 13.0–17.0)
Immature Granulocytes: 1 %
Lymphocytes Relative: 4 %
Lymphs Abs: 0.2 10*3/uL — ABNORMAL LOW (ref 0.7–4.0)
MCH: 30.6 pg (ref 26.0–34.0)
MCHC: 32.2 g/dL (ref 30.0–36.0)
MCV: 94.9 fL (ref 80.0–100.0)
Monocytes Absolute: 0.9 10*3/uL (ref 0.1–1.0)
Monocytes Relative: 19 %
Neutro Abs: 3.7 10*3/uL (ref 1.7–7.7)
Neutrophils Relative %: 74 %
Platelets: 232 10*3/uL (ref 150–400)
RBC: 3.5 MIL/uL — ABNORMAL LOW (ref 4.22–5.81)
RDW: 12.9 % (ref 11.5–15.5)
WBC Morphology: INCREASED
WBC: 5 10*3/uL (ref 4.0–10.5)
nRBC: 0 % (ref 0.0–0.2)

## 2024-02-03 LAB — COMPREHENSIVE METABOLIC PANEL
ALT: 10 U/L (ref 0–44)
AST: 14 U/L — ABNORMAL LOW (ref 15–41)
Albumin: 2.6 g/dL — ABNORMAL LOW (ref 3.5–5.0)
Alkaline Phosphatase: 51 U/L (ref 38–126)
Anion gap: 8 (ref 5–15)
BUN: 21 mg/dL — ABNORMAL HIGH (ref 6–20)
CO2: 31 mmol/L (ref 22–32)
Calcium: 8 mg/dL — ABNORMAL LOW (ref 8.9–10.3)
Chloride: 99 mmol/L (ref 98–111)
Creatinine, Ser: 1.22 mg/dL (ref 0.61–1.24)
GFR, Estimated: >60 mL/min (ref >60)
Glucose, Bld: 104 mg/dL — ABNORMAL HIGH (ref 70–99)
Potassium: 3.6 mmol/L (ref 3.5–5.1)
Sodium: 138 mmol/L (ref 135–145)
Total Bilirubin: 0.7 mg/dL (ref 0.0–1.2)
Total Protein: 5.3 g/dL — ABNORMAL LOW (ref 6.5–8.1)

## 2024-02-03 LAB — MAGNESIUM: Magnesium: 2.3 mg/dL (ref 1.7–2.4)

## 2024-02-03 LAB — HEMOGLOBIN A1C
Hgb A1c MFr Bld: 5.1 % (ref 4.8–5.6)
Mean Plasma Glucose: 99.67 mg/dL

## 2024-02-03 LAB — PHOSPHORUS: Phosphorus: 1.6 mg/dL — ABNORMAL LOW (ref 2.5–4.6)

## 2024-02-03 LAB — SURGICAL PATHOLOGY

## 2024-02-03 MED ORDER — POTASSIUM PHOSPHATES 15 MMOLE/5ML IV SOLN
15.0000 mmol | Freq: Once | INTRAVENOUS | Status: AC
  Administered 2024-02-03: 15 mmol via INTRAVENOUS
  Filled 2024-02-03: qty 5

## 2024-02-03 MED ORDER — DEXTROSE IN LACTATED RINGERS 5 % IV SOLN: INTRAVENOUS | Status: AC

## 2024-02-03 NOTE — Progress Notes (Signed)
 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  FAP s/p total colectomy in 2016, R internal jugular thrombus and multiple other medical issues here with nausea, vomiting, abdominal pain and found to have Assad Harbeson small bowel obstruction.   Assessment & Plan:   Principal Problem:   SBO (small bowel obstruction) (HCC) Active Problems:   Malignant neoplasm of tonsillar fossa (HCC)   AKI (acute kidney injury) (HCC)   Hyponatremia   Hyperkalemia   Hypochloremia  High Grade Small Bowel Obstruction S/p Total Colectomy for FAP in 2016 S/p PEG Appreciate general surgery's assistance - s/p ex lap with small bowel resection, repair of small bowel serosa x1, lysis of adhesions. Heparin gtt on hold - will defer resumption to surgery. Revaluate 3/8.  Post op care per general surgery GT to LIS, antiemetics, bowel rest.  If not turning around by 3/9, plan for considering TPN.  Acute Kidney Injury Baseline creatinine < 1.0 Continue IVF, additional workup if not improving CT without hydro   Sinus Tachycardia Improved today Will ctm on tele  Right internal jugular Thrombus related to Port  Hasn't been on anticoagulation prior to admission due to issues with cost Needs anticoagulation rx at discharge, will need continued close follow up outpatient Will resume anticoagulation when ok with surgery - reevaluate 3/8 Consider repeat CT with contrast to reassess clot burden, oncology had planned for this in March  Anemia Noted, will trend post op  Malignant Neoplasm of Tonsillar Fossa Referred to oncology in Oct 2024 - treated with chemoradiation (received 4 doses of weekly cisplatin, rest held due to neutropenia  and patient preference). Following with Dr. [name] from oncology  T wave inversion Leads II, III, aVF and V5, V6 Negative troponin Noted, consider echo   Asthma Not in exacerbation  Hyperglycemia Follow A1c     DVT prophylaxis: heparin gtt remains on hold, subcutaneous heparin for now Code Status: full Family Communication: wife on phone, dad and uncle in room 3/7 Disposition:   Status is: Inpatient Remains inpatient appropriate because: need for ongoing care, surgery   Consultants:  surgery  Procedures:  none  Antimicrobials:  Anti-infectives (From admission, onward)    None       Subjective: Pain about the same No new complaints No flatus   Objective: Vitals:   02/03/24 0411 02/03/24 0442 02/03/24 0807 02/03/24 0935  BP:  110/82  118/68  Pulse:  (!) 106  94  Resp: 15 16 16 20   Temp:  99 F (37.2 C)  98.6 F (37 C)  TempSrc:      SpO2: 100% 98% 98% 99%  Weight:      Height:        Intake/Output Summary (Last 24 hours) at 02/03/2024 1141 Last data filed at 02/03/2024 1100 Gross per 24 hour  Intake 2228.97 ml  Output 2350 ml  Net -121.03 ml   Filed Weights   01/30/24 1718 01/30/24 1719  Weight: 70.3 kg 70.3 kg    Examination:  General: No acute distress. Cardiovascular: RRR Lungs: unlabored Abdomen: flat, appropriately tender, G tube Neurological: Alert and oriented 3. Moves all extremities 4. Cranial nerves II through XII grossly intact. Extremities: No clubbing or cyanosis.  No edema.   Data Reviewed: I have personally reviewed following labs and imaging studies  CBC: Recent Labs  Lab 01/30/24 1800 01/31/24 0455 02/01/24 0553 02/02/24 0541 02/03/24 0512  WBC 5.5 5.0 5.0 10.9* 5.0  NEUTROABS 4.1  --   --   --  3.7  HGB 15.5 14.0 13.8 13.6 10.7*  HCT 45.5 41.1 43.0 42.3 33.2*  MCV 89.9 90.1 94.7 94.4 94.9  PLT 336 301 322 323 232    Basic Metabolic Panel: Recent Labs  Lab 01/30/24 1407 01/30/24 1800 01/31/24 0455  02/02/24 0541 02/03/24 0512  NA 131* 128* 132* 137 138  K 5.7* 5.7* 4.4 4.4 3.6  CL 85* 85* 85* 96* 99  CO2 33* 29 32 30 31  GLUCOSE 163* 147* 115* 136* 104*  BUN 28* 30* 33* 29* 21*  CREATININE 1.74* 1.79* 1.58* 1.37* 1.22  CALCIUM 10.3 9.5 9.5 8.3* 8.0*  MG  --   --   --   --  2.3  PHOS  --   --   --   --  1.6*    GFR: Estimated Creatinine Clearance: 77.1 mL/min (by C-G formula based on SCr of 1.22 mg/dL).  Liver Function Tests: Recent Labs  Lab 01/30/24 1407 01/30/24 1800 02/03/24 0512  AST 11* 16 14*  ALT 9 15 10   ALKPHOS 77 73 51  BILITOT 0.8 1.0 0.7  PROT 8.3* 8.6* 5.3*  ALBUMIN 4.9 4.3 2.6*    CBG: Recent Labs  Lab 01/31/24 0046  GLUCAP 194*     No results found for this or any previous visit (from the past 240 hours).       Radiology Studies: No results found.       Scheduled Meds:  acetaminophen  650 mg Oral Q6H   Or   acetaminophen  650 mg Rectal Q6H   Chlorhexidine Gluconate Cloth  6 each Topical Daily   heparin injection (subcutaneous)  5,000 Units Subcutaneous Q8H   HYDROmorphone   Intravenous Q4H   methocarbamol (ROBAXIN) injection  500 mg Intravenous Q8H   Continuous Infusions:  potassium PHOSPHATE IVPB (in mmol) 15 mmol (02/03/24 0924)      LOS: 3 days    Time spent: over 30 min    [name], MD [facility]   To contact the attending provider between 7A-7P or the covering provider during after hours 7P-7A, please log into the web site [email or web address] and access using universal [place] password for that web site. If you do not have the password, please call the hospital operator.  02/03/2024, 11:41 AM

## 2024-02-03 NOTE — Plan of Care (Signed)

## 2024-02-03 NOTE — Plan of Care (Signed)

## 2024-02-03 NOTE — Progress Notes (Signed)
  Subjective No acute events.  Denies any flatus or bowel movements yet.  G-tube has been to wall suction-1.8 L  Objective: Vital signs in last 24 hours: Temp:  [97.4 F (36.3 C)-99 F (37.2 C)] 98.6 F (37 C) (03/07 0935) Pulse Rate:  [88-106] 94 (03/07 0935) Resp:  [15-20] 20 (03/07 0935) BP: (110-135)/(68-84) 118/68 (03/07 0935) SpO2:  [96 %-100 %] 99 % (03/07 0935) FiO2 (%):  [21 %-24 %] 24 % (03/07 0411) Last BM Date :  (PTA//SBO)  Intake/Output from previous day: 03/06 0701 - 03/07 0700 In: 1698.4 [I.V.:1698.4] Out: 2750 [Urine:1000] Intake/Output this shift: Total I/O In: -  Out: 500 [Urine:500]  Gen: NAD, comfortable CV: RRR Pulm: Normal work of breathing Abd: Soft, appropriate tenderness, mild distention; midline incision is clean/dry without drainage or erythema. Ext: SCDs in place  Lab Results: CBC  Recent Labs    02/02/24 0541 02/03/24 0512  WBC 10.9* 5.0  HGB 13.6 10.7*  HCT 42.3 33.2*  PLT 323 232   BMET Recent Labs    02/02/24 0541 02/03/24 0512  NA 137 138  K 4.4 3.6  CL 96* 99  CO2 30 31  GLUCOSE 136* 104*  BUN 29* 21*  CREATININE 1.37* 1.22  CALCIUM 8.3* 8.0*   PT/INR No results for input(s): "LABPROT", "INR" in the last 72 hours.  ABG No results for input(s): "PHART", "HCO3" in the last 72 hours.  Invalid input(s): "PCO2", "PO2"  Studies/Results:  Anti-infectives: Anti-infectives (From admission, onward)    None        Assessment/Plan: Patient Active Problem List   Diagnosis Date Noted   SBO (small bowel obstruction) (HCC) 01/31/2024   AKI (acute kidney injury) (HCC) 01/31/2024   Hyponatremia 01/31/2024   Hyperkalemia 01/31/2024   Hypochloremia 01/31/2024   Internal jugular (IJ) vein thromboembolism, acute, right (HCC) 12/16/2023   Oral mucositis due to radiation therapy 11/02/2023   Leukopenia due to antineoplastic chemotherapy (HCC) 10/26/2023   Cancer associated pain 10/26/2023   Visual disturbances  10/05/2023   Hiccups 10/05/2023   GERD without esophagitis 10/05/2023   Port-A-Cath in place 10/04/2023   Bleeding hemorrhoid 09/29/2023   Familial adenomatous polyposis 09/15/2023   Malignant neoplasm of tonsillar fossa (HCC) 09/14/2023   Submandibular swelling 06/16/2023   Rotator cuff impingement syndrome, left 07/02/2022   Insomnia 04/01/2022   Vasovagal syncope 02/11/2022   Cervical spondylosis 11/13/2021   Healthcare maintenance 09/04/2021   Anxiety and depression 07/24/2021   Other spondylosis with radiculopathy, lumbar region 07/24/2021   Adenomatous polyp 04/04/2015    SBO, ? Closed loop G tube in place Tonsillar cancer (stage I), s/p chemo x 4 cycles and XRT FAP, s/p TAC with J pouch Internal jugular VTE associated with port  -Doing reasonably well. - Ambulate 5 times per day - Continue G-tube to low intermittent wall suction until reliable return of bowel function - Would not restart heparin drip today - hgb 10.7 from 13.6. Reassess to restart heparin drip without boluses 02/04/24  - Maintenance IV fluid per primary - Awaiting return of bowel function prior to reinitiating diet  - Nutrition-if things aren't really turning the corner by 3/9, would consider starting TPN.  PPX: SCDs; continue subQ heparin today   LOS: 3 days   Marin Olp, MD [facility]

## 2024-02-04 ENCOUNTER — Other Ambulatory Visit: Payer: Self-pay

## 2024-02-04 ENCOUNTER — Inpatient Hospital Stay (HOSPITAL_COMMUNITY)

## 2024-02-04 DIAGNOSIS — K56609 Unspecified intestinal obstruction, unspecified as to partial versus complete obstruction: Secondary | ICD-10-CM | POA: Diagnosis not present

## 2024-02-04 LAB — CBC
HCT: 33 % — ABNORMAL LOW (ref 39.0–52.0)
Hemoglobin: 10.8 g/dL — ABNORMAL LOW (ref 13.0–17.0)
MCH: 30.4 pg (ref 26.0–34.0)
MCHC: 32.7 g/dL (ref 30.0–36.0)
MCV: 93 fL (ref 80.0–100.0)
Platelets: 255 10*3/uL (ref 150–400)
RBC: 3.55 MIL/uL — ABNORMAL LOW (ref 4.22–5.81)
RDW: 12.9 % (ref 11.5–15.5)
WBC: 6 10*3/uL (ref 4.0–10.5)
nRBC: 0 % (ref 0.0–0.2)

## 2024-02-04 LAB — COMPREHENSIVE METABOLIC PANEL
ALT: 8 U/L (ref 0–44)
AST: 12 U/L — ABNORMAL LOW (ref 15–41)
Albumin: 2.4 g/dL — ABNORMAL LOW (ref 3.5–5.0)
Alkaline Phosphatase: 53 U/L (ref 38–126)
Anion gap: 9 (ref 5–15)
BUN: 16 mg/dL (ref 6–20)
CO2: 30 mmol/L (ref 22–32)
Calcium: 8.2 mg/dL — ABNORMAL LOW (ref 8.9–10.3)
Chloride: 97 mmol/L — ABNORMAL LOW (ref 98–111)
Creatinine, Ser: 1.04 mg/dL (ref 0.61–1.24)
GFR, Estimated: >60 mL/min (ref >60)
Glucose, Bld: 116 mg/dL — ABNORMAL HIGH (ref 70–99)
Potassium: 3.3 mmol/L — ABNORMAL LOW (ref 3.5–5.1)
Sodium: 136 mmol/L (ref 135–145)
Total Bilirubin: 0.9 mg/dL (ref 0.0–1.2)
Total Protein: 5.4 g/dL — ABNORMAL LOW (ref 6.5–8.1)

## 2024-02-04 LAB — MAGNESIUM: Magnesium: 2.2 mg/dL (ref 1.7–2.4)

## 2024-02-04 LAB — PHOSPHORUS: Phosphorus: 2.7 mg/dL (ref 2.5–4.6)

## 2024-02-04 LAB — GLUCOSE, CAPILLARY
Glucose-Capillary: 105 mg/dL — ABNORMAL HIGH (ref 70–99)
Glucose-Capillary: 109 mg/dL — ABNORMAL HIGH (ref 70–99)

## 2024-02-04 MED ORDER — HEPARIN (PORCINE) 25000 UT/250ML-% IV SOLN
2700.0000 [IU]/h | INTRAVENOUS | Status: DC
  Administered 2024-02-04: 1200 [IU]/h via INTRAVENOUS
  Administered 2024-02-05: 1650 [IU]/h via INTRAVENOUS
  Administered 2024-02-06: 2050 [IU]/h via INTRAVENOUS
  Administered 2024-02-06: 2200 [IU]/h via INTRAVENOUS
  Administered 2024-02-07: 2450 [IU]/h via INTRAVENOUS
  Filled 2024-02-04 (×5): qty 250

## 2024-02-04 MED ORDER — DEXTROSE 70 % IV SOLN
INTRAVENOUS | Status: AC
  Filled 2024-02-04: qty 432

## 2024-02-04 MED ORDER — INSULIN ASPART 100 UNIT/ML IJ SOLN
0.0000 [IU] | Freq: Three times a day (TID) | INTRAMUSCULAR | Status: DC
  Administered 2024-02-05 (×3): 1 [IU] via SUBCUTANEOUS

## 2024-02-04 MED ORDER — DEXTROSE IN LACTATED RINGERS 5 % IV SOLN: INTRAVENOUS | Status: AC

## 2024-02-04 MED ORDER — POTASSIUM CHLORIDE 10 MEQ/50ML IV SOLN
10.0000 meq | INTRAVENOUS | Status: AC
  Administered 2024-02-04 (×4): 10 meq via INTRAVENOUS
  Filled 2024-02-04 (×4): qty 50

## 2024-02-04 MED ORDER — SODIUM CHLORIDE 0.9% FLUSH
10.0000 mL | Freq: Two times a day (BID) | INTRAVENOUS | Status: DC
  Administered 2024-02-04 – 2024-02-14 (×20): 10 mL

## 2024-02-04 MED ORDER — SODIUM CHLORIDE 0.9% FLUSH: 10.0000 mL | INTRAVENOUS | Status: DC | PRN

## 2024-02-04 NOTE — Progress Notes (Addendum)
 PHARMACY - ANTICOAGULATION CONSULT NOTE  Pharmacy Consult for heparin  Indication: hx right internal jugular (IJ) vein thromboembolism  No Known Allergies  Patient Measurements: Height: 5\' 8"  (172.7 cm) Weight: 70.3 kg (155 lb) IBW/kg (Calculated) : 68.4 Heparin Dosing Weight:   Vital Signs: Temp: 98.4 F (36.9 C) (03/08 1349) Temp Source: Oral (03/08 1349) BP: 124/79 (03/08 1349) Pulse Rate: 105 (03/08 1349)  Labs: Recent Labs    02/02/24 0541 02/03/24 0512 02/04/24 0728  HGB 13.6 10.7* 10.8*  HCT 42.3 33.2* 33.0*  PLT 323 232 255  CREATININE 1.37* 1.22 1.04    Estimated Creatinine Clearance: 90.4 mL/min (by C-G formula based on SCr of 1.04 mg/dL).   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 He presented to the ED on 01/30/24 with c/o n/v and abdominal pain. He was subsequently found to have SBO and underwent exp lap with small bowel resection, repair of small bowel serosa and lysis of adhesions on 02/01/24.  Heparin drip started on admission for internal jugular thrombus and d/ced on 02/01/24 for surgery.  Pharmacy has been consulted on 02/04/24 to resume heparin drip back with no bolus.   Today, 02/04/2024: - cbc stable - no bleeding documented  - scr 1.04 - pt received heparin 5000 units SQ at 1pm   Goal of Therapy:  Heparin level  0.3-0.7 units/ml Monitor platelets by anticoagulation protocol: Yes   Plan:  - d/c heparin SQ - start heparin drip at 1200 units/hr - check 6 hr hepaein level  - monitor for s/sx bleeding   Gretchen Weinfeld P 02/04/2024,4:31 PM

## 2024-02-04 NOTE — Progress Notes (Signed)
 PROGRESS NOTE    Harry Williams  ZDG:[id number] DOB: 01/02/1981 DOA: 01/30/2024 PCP: [name], MD  Chief Complaint  Patient presents with   Emesis   Abdominal Pain    Brief Narrative:   43 yo with hx malignant neoplasm of the tonsillar fossa s/p chemoradiation, oral mucositis due to radiation therapy, dysphagia s/p PEG tube.  FAP s/p total colectomy in 2016, R internal jugular thrombus and multiple other medical issues here with nausea, vomiting, abdominal pain and found to have [name] small bowel obstruction.   Assessment & Plan:   Principal Problem:   SBO (small bowel obstruction) (HCC) Active Problems:   Malignant neoplasm of tonsillar fossa (HCC)   AKI (acute kidney injury) (HCC)   Hyponatremia   Hyperkalemia   Hypochloremia  High Grade Small Bowel Obstruction S/p Total Colectomy for FAP in 2016 S/p PEG Appreciate general surgery's assistance - s/p ex lap with small bowel resection, repair of small bowel serosa x1, lysis of adhesions. Heparin gtt on hold - will defer resumption to surgery. Will discuss. Plain films without bowel dilatation Post op care per general surgery GT to LIS, antiemetics, bowel rest.  TPN per surgery. Some upper abdominal discomfort today - will discuss with surgery  Acute Kidney Injury Baseline creatinine < 1.0 Continue IVF, additional workup if not improving CT without hydro   Sinus Tachycardia Improved today Will ctm on tele  Right internal jugular Thrombus related to Port  Hasn't been on anticoagulation prior to admission due to issues with cost Needs anticoagulation rx at discharge, will need continued close follow up outpatient Will resume anticoagulation when ok with surgery - reevaluate 3/8 Consider repeat CT with contrast to reassess clot burden, oncology had planned for this in March  Anemia Noted, will trend post op  Malignant Neoplasm of Tonsillar Fossa Referred to oncology in Oct 2024 - treated with chemoradiation  (received 4 doses of weekly cisplatin, rest held due to neutropenia and patient preference). Following with Dr. [name] from oncology  T wave inversion Leads II, III, aVF and V5, V6 Negative troponin Noted, consider echo   Asthma Not in exacerbation  Hyperglycemia Follow A1c (5.1)    DVT prophylaxis: heparin gtt remains on hold, subcutaneous heparin for now Code Status: full Family Communication: wife on phone, dad and uncle in room 3/7 Disposition:   Status is: Inpatient Remains inpatient appropriate because: need for ongoing care, surgery   Consultants:  surgery  Procedures:  none  Antimicrobials:  Anti-infectives (From admission, onward)    None       Subjective: More pain across mid section  Objective: Vitals:   02/04/24 0906 02/04/24 0915 02/04/24 1135 02/04/24 1349  BP:    124/79  Pulse:    (!) 105  Resp: 19 19 20 18   Temp:    98.4 F (36.9 C)  TempSrc:    Oral  SpO2:  99% 98% 100%  Weight:      Height:        Intake/Output Summary (Last 24 hours) at 02/04/2024 1542 Last data filed at 02/04/2024 0915 Gross per 24 hour  Intake 1555.35 ml  Output 2600 ml  Net -1044.65 ml   Filed Weights   01/30/24 1718 01/30/24 1719  Weight: 70.3 kg 70.3 kg    Examination:  General: No acute distress. Cardiovascular: RRR Lungs: unlabored Abdomen: mildly distended, appropriately tender - midline incision with staples, honeycomb dressing Neurological: Alert and oriented 3. Moves all extremities 4 with equal strength. Cranial nerves II through  XII grossly intact. Extremities: No clubbing or cyanosis. No edema.   Data Reviewed: I have personally reviewed following labs and imaging studies  CBC: Recent Labs  Lab 01/30/24 1800 01/31/24 0455 02/01/24 0553 02/02/24 0541 02/03/24 0512 02/04/24 0728  WBC 5.5 5.0 5.0 10.9* 5.0 6.0  NEUTROABS 4.1  --   --   --  3.7  --   HGB 15.5 14.0 13.8 13.6 10.7* 10.8*  HCT 45.5 41.1 43.0 42.3 33.2* 33.0*  MCV 89.9  90.1 94.7 94.4 94.9 93.0  PLT 336 301 322 323 232 255    Basic Metabolic Panel: Recent Labs  Lab 01/30/24 1800 01/31/24 0455 02/02/24 0541 02/03/24 0512 02/04/24 0728  NA 128* 132* 137 138 136  K 5.7* 4.4 4.4 3.6 3.3*  CL 85* 85* 96* 99 97*  CO2 29 32 30 31 30   GLUCOSE 147* 115* 136* 104* 116*  BUN 30* 33* 29* 21* 16  CREATININE 1.79* 1.58* 1.37* 1.22 1.04  CALCIUM 9.5 9.5 8.3* 8.0* 8.2*  MG  --   --   --  2.3 2.2  PHOS  --   --   --  1.6* 2.7    GFR: Estimated Creatinine Clearance: 90.4 mL/min (by C-G formula based on SCr of 1.04 mg/dL).  Liver Function Tests: Recent Labs  Lab 01/30/24 1407 01/30/24 1800 02/03/24 0512 02/04/24 0728  AST 11* 16 14* 12*  ALT 9 15 10 8   ALKPHOS 77 73 51 53  BILITOT 0.8 1.0 0.7 0.9  PROT 8.3* 8.6* 5.3* 5.4*  ALBUMIN 4.9 4.3 2.6* 2.4*    CBG: Recent Labs  Lab 01/31/24 0046 02/04/24 1539  GLUCAP 194* 105*     No results found for this or any previous visit (from the past 240 hours).       Radiology Studies: DG Abd 1 View Result Date: 02/04/2024 CLINICAL DATA:  Abdominal pain.  Constipation. EXAM: ABDOMEN - 1 VIEW COMPARISON:  February 01, 2024.  January 30, 2024. FINDINGS: The bowel gas pattern is normal. Probable right renal calculus is noted. Phleboliths are seen in right pelvis. IMPRESSION: No abnormal bowel dilatation.  Possible right renal calculus. Electronically Signed   By: Lupita Raider M.D.   On: 02/04/2024 14:18   [name] EKG SITE RITE Result Date: 02/04/2024 If Site Rite image not attached, placement could not be confirmed due to current cardiac rhythm.        Scheduled Meds:  acetaminophen  650 mg Oral Q6H   Or   acetaminophen  650 mg Rectal Q6H   Chlorhexidine Gluconate Cloth  6 each Topical Daily   heparin injection (subcutaneous)  5,000 Units Subcutaneous Q8H   HYDROmorphone   Intravenous Q4H   insulin aspart  0-9 Units Subcutaneous Q8H   methocarbamol (ROBAXIN) injection  500 mg Intravenous Q8H   sodium  chloride flush  10-40 mL Intracatheter Q12H   Continuous Infusions:  dextrose 5% lactated ringers 100 mL/hr at 02/04/24 0600   dextrose 5% lactated ringers     potassium chloride 10 mEq (02/04/24 1542)   TPN ADULT (ION)        LOS: 4 days    Time spent: over 30 min    [name], MD [facility]   To contact the attending provider between 7A-7P or the covering provider during after hours 7P-7A, please log into the web site [email or web address] and access using universal [place] password for that web site. If you do not have the password, please call the hospital  operator.  02/04/2024, 3:42 PM

## 2024-02-04 NOTE — Progress Notes (Signed)
 PHARMACY - ANTICOAGULATION CONSULT NOTE  Pharmacy Consult for Heparin Indication: hx internal jugular thrombus  No Known Allergies  Patient Measurements: Height: 5\' 8"  (172.7 cm) Weight: 70.3 kg (155 lb) IBW/kg (Calculated) : 68.4 Heparin Dosing Weight: TBW  Vital Signs: Temp: 99.5 F (37.5 C) (03/08 0527) Temp Source: Oral (03/08 0527) BP: 131/92 (03/08 0527) Pulse Rate: 110 (03/08 0527)  Labs: Recent Labs    02/02/24 0541 02/03/24 0512  HGB 13.6 10.7*  HCT 42.3 33.2*  PLT 323 232  CREATININE 1.37* 1.22    Estimated Creatinine Clearance: 77.1 mL/min (by C-G formula based on SCr of 1.22 mg/dL).   Medical History: Past Medical History:  Diagnosis Date   Anxiety    Asthma    Avascular necrosis of hip, left (HCC) 07/24/2021   Bright red blood per rectum 12/16/2022   Closed fracture of nasal bone with routine healing 12/24/2016   Depression    DVT (deep venous thrombosis) (HCC)    PTSD (post-traumatic stress disorder)     Medications:  Scheduled:   acetaminophen  650 mg Oral Q6H   Or   acetaminophen  650 mg Rectal Q6H   Chlorhexidine Gluconate Cloth  6 each Topical Daily   heparin injection (subcutaneous)  5,000 Units Subcutaneous Q8H   HYDROmorphone   Intravenous Q4H   methocarbamol (ROBAXIN) injection  500 mg Intravenous Q8H   Infusions:   dextrose 5% lactated ringers 100 mL/hr at 02/04/24 0600    Assessment: 42 yoM presented on 3/3 with vomiting and abdominal pain found to have SBO.  PMH significant for total colectomy and J-pouch placement at [facility] because of multiple polyps in his colon, tonsillar cancer, and internal jugular VTE associated with his port not on anticoagulation d/t cost.  Pharmacy was consulted to dose heparin on admission, but was held on 3/5 for small bowel resection. . Today, 02/04/2024: CBC: ***   Goal of Therapy:  Heparin level 0.3-0.7 units/ml Monitor platelets by anticoagulation protocol: Yes   Plan:    Lynann Beaver  PharmD, BCPS [facility] [phone] 02/04/2024 7:44 AM

## 2024-02-04 NOTE — Plan of Care (Signed)

## 2024-02-04 NOTE — Progress Notes (Signed)
 Secure chat sent to Dr Lowell Guitar re PICC placement.  Pt has a PAC that can be accessed for the TNA tonight.  New order to cancel PICC placement.

## 2024-02-04 NOTE — Progress Notes (Signed)
 Initial Nutrition Assessment  DOCUMENTATION CODES:   Not applicable  INTERVENTION:  Initiate TPN Suspect pt is at elevated refeeding risk, monitor refeeding labs daily x4 occurrences; MD to order repletion as necessary Thiamine 100mg  daily x5 days Once cleared by Surgery, monitor ability to resume oral intake If unable to tolerate and oral nutrition, recommend TF via PEG tube: 1 carton Osmolite 1.5; 7 times daily Provides 2485 kcal, 105g protein and free water daily  NUTRITION DIAGNOSIS:   Increased nutrient needs related to cancer and cancer related treatments as evidenced by estimated needs.  GOAL:   Patient will meet greater than or equal to 90% of their needs  MONITOR:   Labs, Weight trends  REASON FOR ASSESSMENT:   Consult New TPN/TNA  ASSESSMENT:   Pt admitted with emesis and abdominal pain secondary to SBO. PMH significant for  malignant neoplasm of the tonsillar fossa s/p chemoradiation, oral mucositis d/t radiation therapy, dysphagia s/p PEG tube, FAP s/p total colectomy (2016), RIJ thrombus.  3/5: s/p exlap with small bowel resection, repair of small bowel serosa, lysis of adhesions 3/8: start TPN d/t ongoing ileus  Called and spoke with pt via phone call to room.  He states that he is feeling fair today. Denies any further episodes of emesis. Endorses having output from his G-tube.   Pt is followed by RD at the [facility]. Last seen 1/28.  At that time plan was to wean from TF and try to increase PO intake.  Pt reports that he ate some seafood which he didn't realize was seasoned with spices about a month ago and since that time any intake of solid foods has caused a burning in his throat. He has been able to tolerate liquids but was not consuming protein supplements. He mentions that d/t this he has been more heavily reliant on TF, taking 7 cartons of Osmolite 1.5.   Pt cannot recall when he last administered a bolus feed via G-tube.   Pt reports  that he feels as though he has lost weight. He reports that his weight was about 180 lbs a few months ago and is now ~155 lbs.   Suspect admit weight is likely stated, pulled from prior recent documentation.  However pt's weight notable has declined 9.4% between 11/01/23 and 01/25/24 which is clinically significant for time frame.   Drains/lines: G-tube- 1.9L x24 hours output  Medications: SSI 0-9 units q8h Drips: D5 in LR @ 159ml/hr Potassium chloride  Labs:  Potassium 3.3 Albumin 2.4 AST 12  NUTRITION - FOCUSED PHYSICAL EXAM: RD working remotely. Deferred to follow up.   Diet Order:   Diet Order             Diet NPO time specified  Diet effective now                   EDUCATION NEEDS:   No education needs have been identified at this time  Skin:  Skin Assessment: Skin Integrity Issues: Skin Integrity Issues:: Incisions Incisions: closed abdominal incision  Last BM:  PTA  Height:   Ht Readings from Last 1 Encounters:  01/30/24 5\' 8"  (1.727 m)    Weight:   Wt Readings from Last 1 Encounters:  01/30/24 70.3 kg   BMI:  Body mass index is 23.57 kg/m.  Estimated Nutritional Needs:   Kcal:  2300-2500  Protein:  115-130g  Fluid:  >/=2L  Drusilla Kanner, RDN, LDN Clinical Nutrition

## 2024-02-04 NOTE — Progress Notes (Signed)
 3 Days Post-Op   Subjective/Chief Complaint: Patient without complaints.  No flatus.   Objective: Vital signs in last 24 hours: Temp:  [98.1 F (36.7 C)-99.5 F (37.5 C)] 99.5 F (37.5 C) (03/08 0527) Pulse Rate:  [96-117] 110 (03/08 0527) Resp:  [10-20] 19 (03/08 0915) BP: (119-144)/(73-92) 131/92 (03/08 0527) SpO2:  [95 %-100 %] 99 % (03/08 0915) FiO2 (%):  [21 %-96 %] 21 % (03/08 0915) Last BM Date :  (PTA)  Intake/Output from previous day: 03/07 0701 - 03/08 0700 In: 2120.4 [I.V.:2053; IV Piggyback:67.3] Out: 3100 [Urine:1200; Drains:1900] Intake/Output this shift: No intake/output data recorded.  Abdomen: Honeycomb dressing in place.  Dry.  No distention, rebound or or guarding.  Lab Results:  Recent Labs    02/03/24 0512 02/04/24 0728  WBC 5.0 6.0  HGB 10.7* 10.8*  HCT 33.2* 33.0*  PLT 232 255   BMET Recent Labs    02/03/24 0512 02/04/24 0728  NA 138 136  K 3.6 3.3*  CL 99 97*  CO2 31 30  GLUCOSE 104* 116*  BUN 21* 16  CREATININE 1.22 1.04  CALCIUM 8.0* 8.2*   PT/INR No results for input(s): "LABPROT", "INR" in the last 72 hours. ABG No results for input(s): "PHART", "HCO3" in the last 72 hours.  Invalid input(s): "PCO2", "PO2"  Studies/Results: [name] EKG SITE RITE Result Date: 02/04/2024 If Site Rite image not attached, placement could not be confirmed due to current cardiac rhythm.   Anti-infectives: Anti-infectives (From admission, onward)    None       Assessment/Plan: s/p Procedure(s) with comments: EXPLORATORY LAPAROTOMY, LYSIS OF ADHESIONS, SMALL BOWEL RESECTION (N/A) - LYSIS OF ADHESIONS, POSSIBLE SMALL BOWEL RESECTION  SBO, ? Closed loop G tube in place Tonsillar cancer (stage I), s/p chemo x 4 cycles and XRT FAP, s/p TAC with J pouch Internal jugular VTE associated with port  PICC line if able  Can use port if functional Start TNA in near future due to ileus  Check KUB    LOS: 4 days    Dortha Schwalbe  MD   02/04/2024

## 2024-02-04 NOTE — Progress Notes (Signed)
 History of RIJ clot (10/2023) with plan to reassess by CT scan in March before considering port removal per oncology note dated 12/15/2023.  Per Dr Elijah Birk, the port can be used if it is working without issue.  Accessed with PH 20 gauge needle with no complications, flushes easily and has good blood return.

## 2024-02-04 NOTE — Progress Notes (Signed)
 PHARMACY - TOTAL PARENTERAL NUTRITION CONSULT NOTE   Indication: Prolonged ileus  Patient Measurements: Height: 5\' 8"  (172.7 cm) Weight: 70.3 kg (155 lb) IBW/kg (Calculated) : 68.4 TPN AdjBW (KG): 70.3 Body mass index is 23.57 kg/m. Usual Weight:   Assessment: 42 yoM presented on 3/3 with vomiting and abdominal pain found to have SBO.  PMH significant for total colectomy and J-pouch placement at [facility] because of multiple polyps in his colon, tonsillar cancer, and internal jugular VTE.  He has been NPO since 3/3 and Pharmacy is consulted to dose TPN for ongoing ileus.   Glucose / Insulin: No hx DM, A1c 5.1.  Glucose 116  Electrolytes: K low at 3.3, Cl low, others WNL including CorrCa 9.48 Renal: SCr down to 1.04, BUN wnl Hepatic: AST low, others WNL (3/8) Intake / Output: - mIVF: D5LR @ 100 ml/hr - Output: drains (Gtube) -1900 mL, urine -1200 mL  GI Imaging: 3/8 KUB:  GI Surgeries / Procedures:  3/5 Small bowel resection  Central access: Implanted port TPN start date: 3/8   Nutritional Goals: Goal TPN rate is 90 mL/hr (provides 97 g of protein and 2138 kcals per day)  RD Assessment: pending    3/8 estimates: 2100kcal  and 91-105 g protein   Current Nutrition:  NPO  Plan:  Now: KCl 10 meq IV x 4 runs  At 18:00  Start TPN at 62mL/hr Electrolytes in TPN: Na 16mEq/L, K 98mEq/L, Ca 8mEq/L, Mg 9mEq/L, and Phos 51mmol/L. Cl:Ac 2:1 Add standard MVI and trace elements to TPN Initiate sensitive SSI and CBGs q8h and adjust as needed  Reduce MIVF to 60 mL/hr at 1800  Monitor TPN labs on Mon/Thurs, and prn   Lynann Beaver PharmD, BCPS [facility] main pharmacy [phone] 02/04/2024 10:00 AM

## 2024-02-05 DIAGNOSIS — K56609 Unspecified intestinal obstruction, unspecified as to partial versus complete obstruction: Secondary | ICD-10-CM | POA: Diagnosis not present

## 2024-02-05 LAB — COMPREHENSIVE METABOLIC PANEL
ALT: 8 U/L (ref 0–44)
AST: 9 U/L — ABNORMAL LOW (ref 15–41)
Albumin: 2.3 g/dL — ABNORMAL LOW (ref 3.5–5.0)
Alkaline Phosphatase: 52 U/L (ref 38–126)
Anion gap: 8 (ref 5–15)
BUN: 14 mg/dL (ref 6–20)
CO2: 29 mmol/L (ref 22–32)
Calcium: 8.1 mg/dL — ABNORMAL LOW (ref 8.9–10.3)
Chloride: 99 mmol/L (ref 98–111)
Creatinine, Ser: 1.03 mg/dL (ref 0.61–1.24)
GFR, Estimated: >60 mL/min (ref >60)
Glucose, Bld: 129 mg/dL — ABNORMAL HIGH (ref 70–99)
Potassium: 4 mmol/L (ref 3.5–5.1)
Sodium: 136 mmol/L (ref 135–145)
Total Bilirubin: 0.8 mg/dL (ref 0.0–1.2)
Total Protein: 5.5 g/dL — ABNORMAL LOW (ref 6.5–8.1)

## 2024-02-05 LAB — CBC
HCT: 32.4 % — ABNORMAL LOW (ref 39.0–52.0)
Hemoglobin: 10.3 g/dL — ABNORMAL LOW (ref 13.0–17.0)
MCH: 30.1 pg (ref 26.0–34.0)
MCHC: 31.8 g/dL (ref 30.0–36.0)
MCV: 94.7 fL (ref 80.0–100.0)
Platelets: 270 10*3/uL (ref 150–400)
RBC: 3.42 MIL/uL — ABNORMAL LOW (ref 4.22–5.81)
RDW: 13 % (ref 11.5–15.5)
WBC: 8.4 10*3/uL (ref 4.0–10.5)
nRBC: 0 % (ref 0.0–0.2)

## 2024-02-05 LAB — GLUCOSE, CAPILLARY
Glucose-Capillary: 127 mg/dL — ABNORMAL HIGH (ref 70–99)
Glucose-Capillary: 128 mg/dL — ABNORMAL HIGH (ref 70–99)
Glucose-Capillary: 144 mg/dL — ABNORMAL HIGH (ref 70–99)

## 2024-02-05 LAB — HEPARIN LEVEL (UNFRACTIONATED)
Heparin Unfractionated: 0.12 [IU]/mL — ABNORMAL LOW (ref 0.30–0.70)
Heparin Unfractionated: 0.13 [IU]/mL — ABNORMAL LOW (ref 0.30–0.70)
Heparin Unfractionated: 0.15 [IU]/mL — ABNORMAL LOW (ref 0.30–0.70)

## 2024-02-05 LAB — MAGNESIUM: Magnesium: 2.1 mg/dL (ref 1.7–2.4)

## 2024-02-05 LAB — PHOSPHORUS: Phosphorus: 2.7 mg/dL (ref 2.5–4.6)

## 2024-02-05 MED ORDER — TRAVASOL 10 % IV SOLN
INTRAVENOUS | Status: AC
  Filled 2024-02-05: qty 1188

## 2024-02-05 MED ORDER — ACETAMINOPHEN 10 MG/ML IV SOLN
1000.0000 mg | Freq: Once | INTRAVENOUS | Status: AC
  Administered 2024-02-06: 1000 mg via INTRAVENOUS
  Filled 2024-02-05: qty 100

## 2024-02-05 MED ORDER — THIAMINE HCL 100 MG/ML IJ SOLN
100.0000 mg | Freq: Every day | INTRAMUSCULAR | Status: AC
  Administered 2024-02-05 – 2024-02-09 (×5): 100 mg via INTRAVENOUS
  Filled 2024-02-05 (×5): qty 2

## 2024-02-05 NOTE — Progress Notes (Signed)
 PHARMACY - ANTICOAGULATION CONSULT NOTE  Pharmacy Consult for heparin  Indication: hx right internal jugular (IJ) vein thromboembolism  No Known Allergies  Patient Measurements: Height: 5\' 8"  (172.7 cm) Weight: 70.3 kg (155 lb) IBW/kg (Calculated) : 68.4 Heparin Dosing Weight:   Vital Signs: Temp: 98.7 F (37.1 C) (03/08 1918) BP: 132/80 (03/08 1918) Pulse Rate: 96 (03/08 1918)  Labs: Recent Labs    02/02/24 0541 02/03/24 0512 02/04/24 0728 02/05/24 0112  HGB 13.6 10.7* 10.8* 10.3*  HCT 42.3 33.2* 33.0* 32.4*  PLT 323 232 255 270  HEPARINUNFRC  --   --   --  0.12*  CREATININE 1.37* 1.22 1.04  --     Estimated Creatinine Clearance: 90.4 mL/min (by C-G formula based on SCr of 1.04 mg/dL).   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 He presented to the ED on 01/30/24 with c/o n/v and abdominal pain. He was subsequently found to have SBO and underwent exp lap with small bowel resection, repair of small bowel serosa and lysis of adhesions on 02/01/24.  Heparin drip started on admission for internal jugular thrombus and d/ced on 02/01/24 for surgery.  Pharmacy has been consulted on 02/04/24 to resume heparin drip back with no bolus.   Today, 02/05/2024: HL 0.12 subtherapeutic on 1200 units/hr Hgb low but stable, plts WNL No bleeding or interruptions  per RN  Goal of Therapy:  Heparin level 0.3-0.7 units/ml Monitor platelets by anticoagulation protocol: Yes   Plan:  - increase heparin drip to 1450 units/hr - check 6 hr heparin level - monitor for s/sx bleeding   Arley Phenix RPh 02/05/2024, 3:49 AM

## 2024-02-05 NOTE — Progress Notes (Signed)
 PHARMACY - ANTICOAGULATION CONSULT NOTE  Pharmacy Consult for heparin  Indication: hx right internal jugular (IJ) vein thromboembolism  No Known Allergies  Patient Measurements: Height: 5\' 8"  (172.7 cm) Weight: 70.3 kg (155 lb) IBW/kg (Calculated) : 68.4 Heparin Dosing Weight: 70 kg  Vital Signs: Temp: 99.9 F (37.7 C) (03/09 1216) BP: 133/78 (03/09 1216) Pulse Rate: 100 (03/09 1216)  Labs: Recent Labs    02/03/24 0512 02/04/24 0728 02/05/24 0112 02/05/24 1052 02/05/24 1811  HGB 10.7* 10.8* 10.3*  --   --   HCT 33.2* 33.0* 32.4*  --   --   PLT 232 255 270  --   --   HEPARINUNFRC  --   --  0.12* 0.13* 0.15*  CREATININE 1.22 1.04 1.03  --   --     Estimated Creatinine Clearance: 91.3 mL/min (by C-G formula based on SCr of 1.03 mg/dL).   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 He presented to the ED on 01/30/24 with c/o n/v and abdominal pain. He was subsequently found to have SBO and underwent exp lap with small bowel resection, repair of small bowel serosa and lysis of adhesions on 02/01/24.  Heparin drip started on admission for internal jugular thrombus and d/ced on 02/01/24 for surgery.  Pharmacy has been consulted on 02/04/24 to resume heparin drip back with no bolus.   Today, 02/05/2024: - heparin level collected at 6:11pm  remains  sub-therapeutic at 0.15 despite rate increased to 1650 units/hr earlier today - Pre pt's RN, no issues with IV line and no bleeding noted  Goal of Therapy:  Heparin level 0.3-0.7 units/ml Monitor platelets by anticoagulation protocol: Yes   Plan:  - increase heparin drip to 1900 units/hr - check 6 hr heparin level  - monitor for s/sx bleeding   Dorna Leitz P 02/05/2024,7:06 PM

## 2024-02-05 NOTE — Progress Notes (Signed)
 4 Days Post-Op   Subjective/Chief Complaint: Complains of soreness. No flatus yet   Objective: Vital signs in last 24 hours: Temp:  [98.3 F (36.8 C)-98.7 F (37.1 C)] 98.3 F (36.8 C) (03/09 0905) Pulse Rate:  [96-106] 106 (03/09 0452) Resp:  [15-20] 15 (03/09 0905) BP: (124-134)/(72-80) 134/72 (03/09 0905) SpO2:  [90 %-100 %] 99 % (03/09 0905) FiO2 (%):  [15 %-25 %] 25 % (03/09 0416) Last BM Date :  (PTA)  Intake/Output from previous day: 03/08 0701 - 03/09 0700 In: 933.7 [I.V.:933.7] Out: 2450 [Urine:450; Drains:2000] Intake/Output this shift: Total I/O In: 300.2 [I.V.:300.2] Out: 1350 [Urine:550; Drains:800]  General appearance: alert and cooperative Resp: clear to auscultation bilaterally Cardio: regular rate and rhythm GI: soft, moderate tenderness. G tube to suction  Lab Results:  Recent Labs    02/04/24 0728 02/05/24 0112  WBC 6.0 8.4  HGB 10.8* 10.3*  HCT 33.0* 32.4*  PLT 255 270   BMET Recent Labs    02/04/24 0728 02/05/24 0112  NA 136 136  K 3.3* 4.0  CL 97* 99  CO2 30 29  GLUCOSE 116* 129*  BUN 16 14  CREATININE 1.04 1.03  CALCIUM 8.2* 8.1*   PT/INR No results for input(s): "LABPROT", "INR" in the last 72 hours. ABG No results for input(s): "PHART", "HCO3" in the last 72 hours.  Invalid input(s): "PCO2", "PO2"  Studies/Results: DG Abd 1 View Result Date: 02/04/2024 CLINICAL DATA:  Abdominal pain.  Constipation. EXAM: ABDOMEN - 1 VIEW COMPARISON:  February 01, 2024.  January 30, 2024. FINDINGS: The bowel gas pattern is normal. Probable right renal calculus is noted. Phleboliths are seen in right pelvis. IMPRESSION: No abnormal bowel dilatation.  Possible right renal calculus. Electronically Signed   By: Lupita Raider M.D.   On: 02/04/2024 14:18   [name] EKG SITE RITE Result Date: 02/04/2024 If Site Rite image not attached, placement could not be confirmed due to current cardiac rhythm.   Anti-infectives: Anti-infectives (From admission, onward)     None       Assessment/Plan: s/p Procedure(s) with comments: EXPLORATORY LAPAROTOMY, LYSIS OF ADHESIONS, SMALL BOWEL RESECTION (N/A) - LYSIS OF ADHESIONS, POSSIBLE SMALL BOWEL RESECTION Continue g tube and bowel rest TPN for nutritional support SBO, ? 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

## 2024-02-05 NOTE — Plan of Care (Signed)
 Patient's post op status condition remains good. Pain managed well with PCA with no additional demands or need for prns overnight

## 2024-02-05 NOTE — Progress Notes (Signed)
 PROGRESS NOTE    Harry Williams  ZOX:[id number] DOB: 23-Nov-1981 DOA: 01/30/2024 PCP: [name], MD  Chief Complaint  Patient presents with   Emesis   Abdominal Pain    Brief Narrative:   43 yo with hx malignant neoplasm of the tonsillar fossa s/p chemoradiation, oral mucositis due to radiation therapy, dysphagia s/p PEG tube.  FAP s/p total colectomy in 2016, R internal jugular thrombus and multiple other medical issues here with nausea, vomiting, abdominal pain and found to have Harry Williams small bowel obstruction.   Assessment & Plan:   Principal Problem:   SBO (small bowel obstruction) (HCC) Active Problems:   Malignant neoplasm of tonsillar fossa (HCC)   AKI (acute kidney injury) (HCC)   Hyponatremia   Hyperkalemia   Hypochloremia  High Grade Small Bowel Obstruction S/p Total Colectomy for FAP in 2016 S/p PEG Appreciate general surgery's assistance - s/p ex lap with small bowel resection, repair of small bowel serosa x1, lysis of adhesions.  Back on heparin gtt.  Monitor H/H. Plain films without bowel dilatation Post op care per general surgery GT to LIS, antiemetics, bowel rest.  TPN per surgery.  PCA for pain.   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 Following with Dr. [name] from  oncology  T wave inversion Leads II, III, aVF and V5, V6 Negative troponin Noted, consider echo   Asthma Not in exacerbation  Hyperglycemia Follow A1c (5.1)    DVT prophylaxis: heparin gtt Code Status: full Family Communication: none at bedside Disposition:   Status is: Inpatient Remains inpatient appropriate because: need for ongoing care, surgery   Consultants:  surgery  Procedures:  none  Antimicrobials:  Anti-infectives (From admission, onward)    None       Subjective: No flatus or stool Feels like things may be moving Continued soreness/pain  Objective: Vitals:   02/05/24 0416 02/05/24 0452 02/05/24 0500 02/05/24 0905  BP:  134/72  134/72  Pulse:  (!) 106    Resp: 16 20  15   Temp:  98.3 F (36.8 C)  98.3 F (36.8 C)  TempSrc:      SpO2: 100% 100% 100% 99%  Weight:      Height:        Intake/Output Summary (Last 24 hours) at 02/05/2024 1125 Last data filed at 02/05/2024 0800 Gross per 24 hour  Intake 1233.93 ml  Output 3800 ml  Net -2566.07 ml   Filed Weights   01/30/24 1718 01/30/24 1719  Weight: 70.3 kg 70.3 kg    Examination:  General: No acute distress. Cardiovascular: RRR Lungs: unlabored Abdomen: mildly distended, appropriately tender Neurological: Alert and oriented 3. Moves all extremities 4 with equal strength. Cranial nerves II through XII grossly intact. Extremities: No clubbing or cyanosis. No edema.  Data Reviewed: I have personally reviewed following labs and imaging studies  CBC: Recent  Labs  Lab 01/30/24 1800 01/31/24 0455 02/01/24 0553 02/02/24 0541 02/03/24 0512 02/04/24 0728 02/05/24 0112  WBC 5.5   < > 5.0 10.9* 5.0 6.0 8.4  NEUTROABS 4.1  --   --   --  3.7  --   --   HGB 15.5   < > 13.8 13.6 10.7* 10.8* 10.3*  HCT 45.5   < > 43.0 42.3 33.2* 33.0* 32.4*  MCV 89.9   < > 94.7 94.4 94.9 93.0 94.7  PLT 336   < > 322 323 232 255 270   < > = values in this interval not displayed.    Basic Metabolic  Panel: Recent Labs  Lab 01/31/24 0455 02/02/24 0541 02/03/24 0512 02/04/24 0728 02/05/24 0112  NA 132* 137 138 136 136  K 4.4 4.4 3.6 3.3* 4.0  CL 85* 96* 99 97* 99  CO2 32 30 31 30 29   GLUCOSE 115* 136* 104* 116* 129*  BUN 33* 29* 21* 16 14  CREATININE 1.58* 1.37* 1.22 1.04 1.03  CALCIUM 9.5 8.3* 8.0* 8.2* 8.1*  MG  --   --  2.3 2.2 2.1  PHOS  --   --  1.6* 2.7 2.7    GFR: Estimated Creatinine Clearance: 91.3 mL/min (by C-G formula based on SCr of 1.03 mg/dL).  Liver Function Tests: Recent Labs  Lab 01/30/24 1407 01/30/24 1800 02/03/24 0512 02/04/24 0728 02/05/24 0112  AST 11* 16 14* 12* 9*  ALT 9 15 10 8 8   ALKPHOS 77 73 51 53 52  BILITOT 0.8 1.0 0.7 0.9 0.8  PROT 8.3* 8.6* 5.3* 5.4* 5.5*  ALBUMIN 4.9 4.3 2.6* 2.4* 2.3*    CBG: Recent Labs  Lab 01/31/24 0046 02/04/24 1539 02/04/24 2244 02/05/24 0800  GLUCAP 194* 105* 109* 127*     No results found for this or any previous visit (from the past 240 hours).       Radiology Studies: DG Abd 1 View Result Date: 02/04/2024 CLINICAL DATA:  Abdominal pain.  Constipation. EXAM: ABDOMEN - 1 VIEW COMPARISON:  February 01, 2024.  January 30, 2024. FINDINGS: The bowel gas pattern is normal. Probable right renal calculus is noted. Phleboliths are seen in right pelvis. IMPRESSION: No abnormal bowel dilatation.  Possible right renal calculus. Electronically Signed   By: Lupita Raider M.D.   On: 02/04/2024 14:18   [name] EKG SITE RITE Result Date: 02/04/2024 If Site Rite image not attached, placement could not be confirmed due to current cardiac rhythm.        Scheduled Meds:  acetaminophen  650 mg Oral Q6H   Or   acetaminophen  650 mg Rectal Q6H   Chlorhexidine Gluconate Cloth  6 each Topical Daily   HYDROmorphone   Intravenous Q4H   insulin aspart  0-9 Units Subcutaneous Q8H   methocarbamol (ROBAXIN) injection  500 mg Intravenous Q8H   sodium chloride flush  10-40 mL Intracatheter Q12H   thiamine (VITAMIN B1)  injection  100 mg Intravenous Daily   Continuous Infusions:  dextrose 5% lactated ringers 61 mL/hr at 02/05/24 0401   heparin 1,450 Units/hr (02/05/24 0402)   TPN ADULT (ION) 40 mL/hr at 02/05/24 0402   TPN ADULT (ION)        LOS: 5 days    Time spent: over 30 min    [name], MD [facility]   To contact the attending provider between 7A-7P or the covering provider during after hours 7P-7A, please log into the web site [email or web address]  and access using universal [place] password for that web site. If you do not have the password, please call the hospital operator.  02/05/2024, 11:25 AM

## 2024-02-05 NOTE — Progress Notes (Addendum)
 PHARMACY - TOTAL PARENTERAL NUTRITION CONSULT NOTE   Indication: Prolonged ileus  Patient Measurements: Height: 5\' 8"  (172.7 cm) Weight: 70.3 kg (155 lb) IBW/kg (Calculated) : 68.4 TPN AdjBW (KG): 70.3 Body mass index is 23.57 kg/m. Usual Weight:   Assessment: 42 yoM presented on 3/3 with vomiting and abdominal pain found to have SBO.  PMH significant for total colectomy and J-pouch placement at [facility] because of multiple polyps in his colon, tonsillar cancer, and internal jugular VTE.  He has been NPO since 3/3 and Pharmacy is consulted to dose TPN for ongoing ileus.   Glucose / Insulin: No hx DM, A1c 5.1.   - CBGs 105-127.  SSI 1 unit/ 24 hrs Electrolytes: Elytes WNL, including CorrCa 9.46 Renal: SCr 1.03, BUN wnl Hepatic: AST low, others WNL (3/9) Intake / Output: - mIVF: D5LR @ 60 ml/hr - Output: drains (Gtube) -1600 mL, urine -450 mL  GI Imaging: 3/8 KUB: No abnormal bowel dilatation. Possible right renal calculus. GI Surgeries / Procedures:  3/5 Small bowel resection  Central access: Implanted port TPN start date: 3/8   Nutritional Goals: Goal TPN rate is 90 mL/hr (provides 119 g of protein and 2372 kcals per day)  RD Assessment: pending Estimated Needs Total Energy Estimated Needs: 2300-2500 Total Protein Estimated Needs: 115-130g Total Fluid Estimated Needs: >/=2L  Current Nutrition:  NPO and TPN  Plan:  At 18:00  Advance TPN to goal rate 37mL/hr Electrolytes in TPN: Na 54mEq/L, K 49mEq/L, Ca 85mEq/L, Mg 69mEq/L, and Phos 36mmol/L. Cl:Ac 2:1 Add standard MVI and trace elements to TPN Initiate sensitive SSI and CBGs q8h and adjust as needed  Reduce MIVF to KVO at 1800  Monitor TPN labs on Mon/Thurs, and prn  Thiamine 100mg  IV daily x 5 days  Lynann Beaver PharmD, BCPS [facility] main pharmacy [phone] 02/05/2024 7:35 AM

## 2024-02-06 ENCOUNTER — Inpatient Hospital Stay (HOSPITAL_COMMUNITY)

## 2024-02-06 DIAGNOSIS — K56609 Unspecified intestinal obstruction, unspecified as to partial versus complete obstruction: Secondary | ICD-10-CM | POA: Diagnosis not present

## 2024-02-06 LAB — URINALYSIS, W/ REFLEX TO CULTURE (INFECTION SUSPECTED)
Bacteria, UA: NONE SEEN
Bilirubin Urine: NEGATIVE
Glucose, UA: NEGATIVE mg/dL
Hgb urine dipstick: NEGATIVE
Ketones, ur: NEGATIVE mg/dL
Leukocytes,Ua: NEGATIVE
Nitrite: NEGATIVE
Protein, ur: 30 mg/dL — AB
Specific Gravity, Urine: 1.038 — ABNORMAL HIGH (ref 1.005–1.030)
pH: 5 (ref 5.0–8.0)

## 2024-02-06 LAB — COMPREHENSIVE METABOLIC PANEL
ALT: 8 U/L (ref 0–44)
AST: 10 U/L — ABNORMAL LOW (ref 15–41)
Albumin: 2.2 g/dL — ABNORMAL LOW (ref 3.5–5.0)
Alkaline Phosphatase: 59 U/L (ref 38–126)
Anion gap: 8 (ref 5–15)
BUN: 16 mg/dL (ref 6–20)
CO2: 26 mmol/L (ref 22–32)
Calcium: 8.1 mg/dL — ABNORMAL LOW (ref 8.9–10.3)
Chloride: 101 mmol/L (ref 98–111)
Creatinine, Ser: 0.95 mg/dL (ref 0.61–1.24)
GFR, Estimated: >60 mL/min (ref >60)
Glucose, Bld: 141 mg/dL — ABNORMAL HIGH (ref 70–99)
Potassium: 3.6 mmol/L (ref 3.5–5.1)
Sodium: 135 mmol/L (ref 135–145)
Total Bilirubin: 0.4 mg/dL (ref 0.0–1.2)
Total Protein: 5.5 g/dL — ABNORMAL LOW (ref 6.5–8.1)

## 2024-02-06 LAB — HEPARIN LEVEL (UNFRACTIONATED)
Heparin Unfractionated: 0.24 [IU]/mL — ABNORMAL LOW (ref 0.30–0.70)
Heparin Unfractionated: 0.23 [IU]/mL — ABNORMAL LOW (ref 0.30–0.70)
Heparin Unfractionated: 0.15 [IU]/mL — ABNORMAL LOW (ref 0.30–0.70)

## 2024-02-06 LAB — CBC
HCT: 32.3 % — ABNORMAL LOW (ref 39.0–52.0)
Hemoglobin: 10.2 g/dL — ABNORMAL LOW (ref 13.0–17.0)
MCH: 30.4 pg (ref 26.0–34.0)
MCHC: 31.6 g/dL (ref 30.0–36.0)
MCV: 96.1 fL (ref 80.0–100.0)
Platelets: 238 10*3/uL (ref 150–400)
RBC: 3.36 MIL/uL — ABNORMAL LOW (ref 4.22–5.81)
RDW: 13.2 % (ref 11.5–15.5)
WBC: 11.3 10*3/uL — ABNORMAL HIGH (ref 4.0–10.5)
nRBC: 0 % (ref 0.0–0.2)

## 2024-02-06 LAB — MAGNESIUM: Magnesium: 2 mg/dL (ref 1.7–2.4)

## 2024-02-06 LAB — TRIGLYCERIDES: Triglycerides: 288 mg/dL — ABNORMAL HIGH (ref <150)

## 2024-02-06 LAB — PHOSPHORUS: Phosphorus: 3.4 mg/dL (ref 2.5–4.6)

## 2024-02-06 MED ORDER — SODIUM CHLORIDE 0.9 % IV BOLUS
1000.0000 mL | Freq: Once | INTRAVENOUS | Status: AC
  Administered 2024-02-06: 1000 mL via INTRAVENOUS

## 2024-02-06 MED ORDER — POTASSIUM CHLORIDE 10 MEQ/100ML IV SOLN
10.0000 meq | INTRAVENOUS | Status: AC
  Administered 2024-02-06 (×4): 10 meq via INTRAVENOUS
  Filled 2024-02-06 (×4): qty 100

## 2024-02-06 MED ORDER — ACETAMINOPHEN 10 MG/ML IV SOLN
1000.0000 mg | Freq: Four times a day (QID) | INTRAVENOUS | Status: AC
  Administered 2024-02-06 – 2024-02-07 (×4): 1000 mg via INTRAVENOUS
  Filled 2024-02-06 (×4): qty 100

## 2024-02-06 MED ORDER — TRAVASOL 10 % IV SOLN
INTRAVENOUS | Status: AC
  Filled 2024-02-06: qty 1188

## 2024-02-06 MED ORDER — POTASSIUM CHLORIDE 10 MEQ/50ML IV SOLN
10.0000 meq | INTRAVENOUS | Status: DC
  Filled 2024-02-06 (×4): qty 50

## 2024-02-06 NOTE — Progress Notes (Addendum)
 5 Days Post-Op   Subjective/Chief Complaint: Still fairly uncomfortable, does not feel like this is gotten much better over the last few days.  No bowel function yet.   Objective: Vital signs in last 24 hours: Temp:  [98.1 F (36.7 C)-100 F (37.8 C)] 100 F (37.8 C) (03/10 0434) Pulse Rate:  [100-109] 108 (03/10 0434) Resp:  [15-22] 18 (03/10 0830) BP: (133-150)/(78-82) 150/82 (03/10 0434) SpO2:  [96 %-100 %] 100 % (03/10 0830) Last BM Date : 01/30/24  Intake/Output from previous day: 03/09 0701 - 03/10 0700 In: 300.2 [I.V.:300.2] Out: 3000 [Urine:850; Drains:2150] Intake/Output this shift: Total I/O In: -  Out: 200 [Urine:200]  General appearance: alert and cooperative Resp: clear to auscultation bilaterally Cardio: regular rate and rhythm GI: soft, moderate tenderness.  Incision is clean and dry with honeycomb in place.  G tube to suction with bilious effluent  Lab Results:  Recent Labs    02/05/24 0112 02/06/24 0457  WBC 8.4 11.3*  HGB 10.3* 10.2*  HCT 32.4* 32.3*  PLT 270 238   BMET Recent Labs    02/05/24 0112 02/06/24 0457  NA 136 135  K 4.0 3.6  CL 99 101  CO2 29 26  GLUCOSE 129* 141*  BUN 14 16  CREATININE 1.03 0.95  CALCIUM 8.1* 8.1*   PT/INR No results for input(s): "LABPROT", "INR" in the last 72 hours. ABG No results for input(s): "PHART", "HCO3" in the last 72 hours.  Invalid input(s): "PCO2", "PO2"  Studies/Results: DG Abd 1 View Result Date: 02/04/2024 CLINICAL DATA:  Abdominal pain.  Constipation. EXAM: ABDOMEN - 1 VIEW COMPARISON:  February 01, 2024.  January 30, 2024. FINDINGS: The bowel gas pattern is normal. Probable right renal calculus is noted. Phleboliths are seen in right pelvis. IMPRESSION: No abnormal bowel dilatation.  Possible right renal calculus. Electronically Signed   By: Lupita Raider M.D.   On: 02/04/2024 14:18    Anti-infectives: Anti-infectives (From admission, onward)    None       Assessment/Plan: s/p  Procedure(s) with comments: EXPLORATORY LAPAROTOMY, LYSIS OF ADHESIONS, SMALL BOWEL RESECTION (N/A) - LYSIS OF ADHESIONS, POSSIBLE SMALL BOWEL RESECTION Continue g tube and bowel rest-will put G-tube to gravity instead of suction and see how he does with that.  Persistent tachycardia for the last several days, suspect he is somewhat fluid down given high G-tube output and will give a bolus today; white count is up slightly today. Tmax 100.  Recheck tomorrow.  If no improvement tomorrow may proceed with CT scan to evaluate for any other intra-abdominal issues. 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

## 2024-02-06 NOTE — Progress Notes (Signed)
 PHARMACY - ANTICOAGULATION CONSULT NOTE  Pharmacy Consult for heparin  Indication: hx right internal jugular (IJ) vein thromboembolism  No Known Allergies  Patient Measurements: Height: 5\' 8"  (172.7 cm) Weight: 70.3 kg (155 lb) IBW/kg (Calculated) : 68.4 Heparin Dosing Weight: 70 kg  Vital Signs: Temp: 100 F (37.8 C) (03/10 0434) BP: 150/82 (03/10 0434) Pulse Rate: 108 (03/10 0434)  Labs: Recent Labs    02/04/24 0728 02/05/24 0112 02/05/24 1052 02/05/24 1811 02/06/24 0121 02/06/24 0457 02/06/24 0842  HGB 10.8* 10.3*  --   --   --  10.2*  --   HCT 33.0* 32.4*  --   --   --  32.3*  --   PLT 255 270  --   --   --  238  --   HEPARINUNFRC  --  0.12*   < > 0.15* 0.24*  --  0.23*  CREATININE 1.04 1.03  --   --   --  0.95  --    < > = values in this interval not displayed.    Estimated Creatinine Clearance: 99 mL/min (by C-G formula based on SCr of 0.95 mg/dL).   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 He presented to the ED on 01/30/24 with c/o n/v and abdominal pain. He was subsequently found to have SBO and underwent exp lap with small bowel resection, repair of small bowel serosa and lysis of adhesions on 02/01/24. Heparin drip started on admission for internal jugular thrombus and d/ced on 02/01/24 for surgery. Pharmacy has been consulted on  02/04/24 to resume heparin drip back with no bolus.   Today, 02/06/2024: - heparin level remains sub-therapeutic at 0.23 despite rate increased to 2050 units/hr - Per pt's RN, no issues with IV line and no bleeding noted  Goal of Therapy:  Heparin level 0.3-0.7 units/ml Monitor platelets by anticoagulation protocol: Yes   Plan:  - increase heparin drip to 2200 units/hr - check 6 hr heparin level  - monitor for s/sx bleeding   [name], Pharmacy Student 02/06/2024, 9:50 AM

## 2024-02-06 NOTE — Plan of Care (Signed)

## 2024-02-06 NOTE — Progress Notes (Signed)
 Nutrition Follow-up  INTERVENTION:   Monitor magnesium, potassium, and phosphorus for at least 3 days, MD to replete as needed, as pt is at risk for refeeding syndrome. -Continue 100 mg Thiamine x 5 days  -TPN management per Pharmacy -Daily weights while on TPN  -Will monitor for diet advancement and ability to start tube feeds  NUTRITION DIAGNOSIS:   Increased nutrient needs related to cancer and cancer related treatments as evidenced by estimated needs.  Ongoing.  GOAL:   Patient will meet greater than or equal to 90% of their needs  Meeting with TPN  MONITOR:   Labs, Weight trends TPN  ASSESSMENT:   Pt admitted with emesis and abdominal pain secondary to SBO. PMH significant for  malignant neoplasm of the tonsillar fossa s/p chemoradiation, oral mucositis d/t radiation therapy, dysphagia s/p PEG tube, FAP s/p total colectomy (2016), RIJ thrombus.  3/3: admitted 3/5: s/p exlap with small bowel resection, repair of small bowel serosa, lysis of adhesions 3/8: start TPN d/t ongoing ileus  Patient in room, PEG still set to suction. Output: 2250 ml x 24 hours. Pt reports feeling fine since TPN was started.  PTA pt was utilizing PEG tube for feedings. Was eating well a month ago until he ate some spicy fish which then lingered in the back of his throat every time he ate so he started utilizing his PEG for feeds more.  Denies any issues with swallowing or chewing. States foods don't taste right after 2-3 bites.   TPN continues at 90 ml/hr, providing 2371 kcals and 118g protein.  Admission weight: 115 lbs  Ordered daily weights now that pt is on TPN.  Medications: Thiamine, KCl  Labs reviewed: CBGs: 128-144  TG 288  NUTRITION - FOCUSED PHYSICAL EXAM:  Flowsheet Row Most Recent Value  Orbital Region No depletion  Upper Arm Region No depletion  Thoracic and Lumbar Region No depletion  Buccal Region No depletion  Temple Region Mild depletion  Clavicle Bone Region  Mild depletion  Clavicle and Acromion Bone Region Mild depletion  Scapular Bone Region Mild depletion  Dorsal Hand No depletion  Patellar Region No depletion  Anterior Thigh Region No depletion  Posterior Calf Region No depletion  Edema (RD Assessment) None  Hair Reviewed  Eyes Reviewed  Mouth Reviewed  Skin Reviewed  Nails Reviewed       Diet Order:   Diet Order             Diet NPO time specified  Diet effective now                   EDUCATION NEEDS:   No education needs have been identified at this time  Skin:  Skin Assessment: Skin Integrity Issues: Skin Integrity Issues:: Incisions Incisions: closed abdominal incision  Last BM:  3/10  Height:   Ht Readings from Last 1 Encounters:  01/30/24 5\' 8"  (1.727 m)    Weight:   Wt Readings from Last 1 Encounters:  01/30/24 70.3 kg    BMI:  Body mass index is 23.57 kg/m.  Estimated Nutritional Needs:   Kcal:  2300-2500  Protein:  115-130g  Fluid:  >/=2L  Tilda Franco, MS, RD, LDN Inpatient Clinical Dietitian Contact via Secure chat

## 2024-02-06 NOTE — Progress Notes (Signed)
 PHARMACY - ANTICOAGULATION CONSULT NOTE  Pharmacy Consult for heparin  Indication: hx right internal jugular (IJ) vein thromboembolism  No Known Allergies  Patient Measurements: Height: 5\' 8"  (172.7 cm) Weight: 70.3 kg (155 lb) IBW/kg (Calculated) : 68.4 Heparin Dosing Weight: 70 kg  Vital Signs: Temp: 98.1 F (36.7 C) (03/09 1930) BP: 145/82 (03/09 1930) Pulse Rate: 109 (03/09 1930)  Labs: Recent Labs    02/03/24 0512 02/04/24 0728 02/05/24 0112 02/05/24 0112 02/05/24 1052 02/05/24 1811 02/06/24 0121  HGB 10.7* 10.8* 10.3*  --   --   --   --   HCT 33.2* 33.0* 32.4*  --   --   --   --   PLT 232 255 270  --   --   --   --   HEPARINUNFRC  --   --  0.12*   < > 0.13* 0.15* 0.24*  CREATININE 1.22 1.04 1.03  --   --   --   --    < > = values in this interval not displayed.    Estimated Creatinine Clearance: 91.3 mL/min (by C-G formula based on SCr of 1.03 mg/dL).   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 He presented to the ED on 01/30/24 with c/o n/v and abdominal pain. He was subsequently found to have SBO and underwent exp lap with small bowel resection, repair of small bowel serosa and lysis of adhesions on 02/01/24.  Heparin drip started on admission for internal jugular thrombus and d/ced on 02/01/24 for surgery.  Pharmacy has been  consulted on 02/04/24 to resume heparin drip back with no bolus.   Today, 02/06/2024: - heparin level remains sub-therapeutic at 0.24 despite rate increased to 1900 units/hr - Per pt's RN, no issues with IV line and no bleeding noted  Goal of Therapy:  Heparin level 0.3-0.7 units/ml Monitor platelets by anticoagulation protocol: Yes   Plan:  - increase heparin drip to 2050 units/hr - check 6 hr heparin level  - monitor for s/sx bleeding   Arley Phenix RPh 02/06/2024, 2:09 AM

## 2024-02-06 NOTE — Progress Notes (Signed)
 PHARMACY - TOTAL PARENTERAL NUTRITION CONSULT NOTE   Indication: Prolonged ileus  Patient Measurements: Height: 5\' 8"  (172.7 cm) Weight: 70.3 kg (155 lb) IBW/kg (Calculated) : 68.4 TPN AdjBW (KG): 70.3 Body mass index is 23.57 kg/m. Usual Weight:   Assessment: 42 yoM presented on 3/3 with vomiting and abdominal pain found to have SBO.  PMH significant for total colectomy and J-pouch placement at [facility] because of multiple polyps in his colon, tonsillar cancer, and internal jugular VTE.  He has been NPO since 3/3 and Pharmacy is consulted to dose TPN for ongoing ileus.   Glucose / Insulin: No hx DM, A1c 5.1.   - CBGs 127 - 144.  SSI 3 unit/ 24 hrs Electrolytes: Elytes WNL, including CorrCa 9.54, K 3.6- goal >=4  Renal: SCr WNL, BUN WNL Hepatic: AST low, others WNL (3/10) Trig 288 (3/10)  Intake / Output: - mIVF: none - Output: drains (Gtube) -2150 mL, urine -850 mL  GI Imaging: 3/8 KUB: No abnormal bowel dilatation. Possible right renal calculus. GI Surgeries / Procedures:  3/5 Small bowel resection  Central access: Implanted port TPN start date: 3/8   Nutritional Goals: Goal TPN rate is 90 mL/hr (provides 119 g of protein and 2372 kcals per day)  RD Assessment: pending Estimated Needs Total Energy Estimated Needs: 2300-2500 Total Protein Estimated Needs: 115-130g Total Fluid Estimated Needs: >/=2L  Current Nutrition:  NPO and TPN  Plan: Now: 4 runs of Kcl At 1800:  continue TPN @ goal rate 66mL/hr Electrolytes in TPN: Na 26mEq/L, K 66mEq/L, Ca 45mEq/L, Mg 108mEq/L, and Phos 25mmol/L. Cl:Ac 2:1 Add standard MVI and trace elements to TPN DC SSI & CBGs Monitor TPN labs on Mon/Thurs, BMET, Mg & Phos in AM  Thiamine 100mg  IV daily x 5 days  Herby Abraham, Pharm.D Use secure chat for questions 02/06/2024 7:39 AM

## 2024-02-06 NOTE — Progress Notes (Signed)
 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  FAP s/p total colectomy in 2016, R internal jugular thrombus and multiple other medical issues here with nausea, vomiting, abdominal pain and found to have Harry Williams small bowel obstruction.   Assessment & Plan:   Principal Problem:   SBO (small bowel obstruction) (HCC) Active Problems:   Malignant neoplasm of tonsillar fossa (HCC)   AKI (acute kidney injury) (HCC)   Hyponatremia   Hyperkalemia   Hypochloremia  High Grade Small Bowel Obstruction S/p Total Colectomy for FAP in 2016 S/p PEG Appreciate general surgery's assistance - s/p ex lap with small bowel resection, repair of small bowel serosa x1, lysis of adhesions.  Back on heparin gtt.  Monitor H/H. Plain films without bowel dilatation Post op care per general surgery GT to LIS, antiemetics, bowel rest.  TPN per surgery.  PCA for pain.   Fever Borderline, temp of 99.9 and 100 in past 24 hrs Blood cultures UA and culture (given his dysuria) CXR without active disease Surgery to consider repeat CT scan tmrw   Acute Kidney Injury Baseline creatinine < 1.0 Continue IVF, additional workup if not improving CT without hydro   Sinus Tachycardia Mild, follow - due to above Will ctm on tele  Dysuria Noted, follow UA/culture  Right internal jugular Thrombus related to [facility]  Hasn't been on anticoagulation prior to admission due to issues with cost Needs anticoagulation rx at discharge, will need continued close follow up outpatient Anticoagulation resumed 3/8 Consider repeat CT with contrast to reassess clot burden, oncology had planned for this in March  Anemia Noted, will trend post  op  Malignant Neoplasm of Tonsillar Fossa Referred to oncology in Oct 2024 - treated with chemoradiation (received 4 doses of weekly cisplatin, rest held due to neutropenia and patient preference). Following with Harry Williams from oncology  T wave inversion Leads II, III, aVF and V5, V6 Negative troponin Noted, consider echo   Asthma Not in exacerbation  Hyperglycemia Follow A1c (5.1)    DVT prophylaxis: heparin gtt Code Status: full Family Communication: none at bedside Disposition:   Status is: Inpatient Remains inpatient appropriate because: need for ongoing care, surgery   Consultants:  surgery  Procedures:  none  Antimicrobials:  Anti-infectives (From admission, onward)    None       Subjective: No flatus C/o dysuria  Objective: Vitals:   02/05/24 2343 02/06/24 0423 02/06/24 0434 02/06/24 0830  BP:   (!) 150/82   Pulse:   (!) 108   Resp: 19 15 16 18   Temp:   100 F (37.8 C)   TempSrc:      SpO2: 98% 97% 100% 100%  Weight:      Height:        Intake/Output Summary (Last 24 hours) at 02/06/2024 1023 Last data filed at 02/06/2024 0936 Gross per 24 hour  Intake 0 ml  Output 1850 ml  Net -1850 ml   Filed Weights   01/30/24 1718 01/30/24 1719  Weight: 70.3 kg 70.3 kg    Examination:  General: No acute distress. Cardiovascular: RRR Lungs: unlabored Abdomen: mild distention - diffuse appropriate TTP - incision well appearing, c/d/i Neurological: Alert and oriented  3. Moves all extremities 4 . Cranial nerves II through XII grossly intact. Extremities: No clubbing or cyanosis. No edema.   Data Reviewed: I have personally reviewed following labs and imaging studies  CBC: Recent Labs  Lab 01/30/24 1800 01/31/24 0455 02/02/24 0541 02/03/24 0512 02/04/24 0728 02/05/24 0112 02/06/24 0457  WBC 5.5   < > 10.9* 5.0 6.0 8.4 11.3*  NEUTROABS 4.1  --   --  3.7  --   --   --   HGB 15.5   < > 13.6 10.7* 10.8* 10.3* 10.2*  HCT 45.5   < > 42.3  33.2* 33.0* 32.4* 32.3*  MCV 89.9   < > 94.4 94.9 93.0 94.7 96.1  PLT 336   < > 323 232 255 270 238   < > = values in this interval not displayed.    Basic Metabolic Panel: Recent Labs  Lab 02/02/24 0541 02/03/24 0512 02/04/24 0728 02/05/24 0112 02/06/24 0457  NA 137 138 136 136 135  K 4.4 3.6 3.3* 4.0 3.6  CL 96* 99 97* 99 101  CO2 30 31 30 29 26   GLUCOSE 136* 104* 116* 129* 141*  BUN 29* 21* 16 14 16   CREATININE 1.37* 1.22 1.04 1.03 0.95  CALCIUM 8.3* 8.0* 8.2* 8.1* 8.1*  MG  --  2.3 2.2 2.1 2.0  PHOS  --  1.6* 2.7 2.7 3.4    GFR: Estimated Creatinine Clearance: 99 mL/min (by C-G formula based on SCr of 0.95 mg/dL).  Liver Function Tests: Recent Labs  Lab 01/30/24 1800 02/03/24 0512 02/04/24 0728 02/05/24 0112 02/06/24 0457  AST 16 14* 12* 9* 10*  ALT 15 10 8 8 8   ALKPHOS 73 51 53 52 59  BILITOT 1.0 0.7 0.9 0.8 0.4  PROT 8.6* 5.3* 5.4* 5.5* 5.5*  ALBUMIN 4.3 2.6* 2.4* 2.3* 2.2*    CBG: Recent Labs  Lab 02/04/24 1539 02/04/24 2244 02/05/24 0800 02/05/24 1603 02/05/24 2311  GLUCAP 105* 109* 127* 128* 144*     Recent Results (from the past 240 hours)  Culture, blood (Routine X 2) w Reflex to ID Panel     Status: None (Preliminary result)   Collection Time: 02/06/24  8:42 AM   Specimen: BLOOD RIGHT ARM  Result Value Ref Range Status   Specimen Description   Final    BLOOD RIGHT ARM Performed at [facility], [address], [place], [place] [address]    Special Requests   Final    BOTTLES DRAWN AEROBIC AND ANAEROBIC Blood Culture adequate volume Performed at [facility], [address], [place], [place] [address]    Culture PENDING  Incomplete   Report Status PENDING  Incomplete  Culture, blood (Routine X 2) w Reflex to ID Panel     Status: None (Preliminary result)   Collection Time: 02/06/24  8:42 AM   Specimen: BLOOD RIGHT HAND  Result Value Ref Range Status   Specimen Description   Final    BLOOD RIGHT  HAND Performed at [facility], [address] [place], [place] [address]    Special Requests   Final    BOTTLES DRAWN AEROBIC AND ANAEROBIC Blood Culture adequate volume Performed at [facility], [address] [place], [place] [address]    Culture PENDING  Incomplete   Report Status PENDING  Incomplete         Radiology Studies: DG CHEST PORT 1 VIEW Result Date: 02/06/2024 CLINICAL DATA:  Fevers EXAM:  PORTABLE CHEST 1 VIEW COMPARISON:  11/01/2023 FINDINGS: Cardiac shadow is within normal limits. Lungs are well aerated bilaterally. Right chest wall port is again seen. No acute bony abnormality is noted. IMPRESSION: No active disease. Electronically Signed   By: Alcide Clever M.D.   On: 02/06/2024 10:20         Scheduled Meds:  acetaminophen  650 mg Oral Q6H   Or   acetaminophen  650 mg Rectal Q6H   Chlorhexidine Gluconate Cloth  6 each Topical Daily   HYDROmorphone   Intravenous Q4H   methocarbamol (ROBAXIN) injection  500 mg Intravenous Q8H   sodium chloride flush  10-40 mL Intracatheter Q12H   thiamine (VITAMIN B1) injection  100 mg Intravenous Daily   Continuous Infusions:  acetaminophen     heparin 2,050 Units/hr (02/06/24 0402)   potassium chloride 10 mEq (02/06/24 0933)   TPN ADULT (ION) 90 mL/hr at 02/05/24 1718   TPN ADULT (ION)        LOS: 6 days    Time spent: over 30 min    [name], MD [facility]   To contact the attending provider between 7A-7P or the covering provider during after hours 7P-7A, please log into the web site [email or web address] and access using universal [place] password for that web site. If you do not have the password, please call the hospital operator.  02/06/2024, 10:23 AM

## 2024-02-06 NOTE — Progress Notes (Signed)
 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 He presented to the ED on 01/30/24 with c/o n/v and abdominal pain. He was subsequently found to have SBO and underwent exp lap with small bowel resection, repair of small bowel serosa and lysis of adhesions on 02/01/24.  Heparin drip started on admission for internal jugular thrombus, d/ced on 02/01/24 for surgery, and resumed back on 02/04/24 (MD recom NO BOLUS).  - heparin level collected at 5:22p decreased to 0.15 despite drip increased to 2200 units/hr earlier today  - per Pt's RN, no issues with Iv line and no bleeding noted - no bleeding documented   Goal of Therapy:  Heparin level 0.3-0.7 units/ml Monitor platelets by anticoagulation protocol: Yes  Plan: - increase heparin drip to 2450 units/hr - chheck 6 hr heparin level  - monitor for s/sx bleeding  Dorna Leitz, PharmD, BCPS 02/06/2024 5:58 PM

## 2024-02-07 ENCOUNTER — Inpatient Hospital Stay (HOSPITAL_COMMUNITY)

## 2024-02-07 DIAGNOSIS — K56609 Unspecified intestinal obstruction, unspecified as to partial versus complete obstruction: Secondary | ICD-10-CM | POA: Diagnosis not present

## 2024-02-07 LAB — CBC
HCT: 29.8 % — ABNORMAL LOW (ref 39.0–52.0)
Hemoglobin: 9.4 g/dL — ABNORMAL LOW (ref 13.0–17.0)
MCH: 30.8 pg (ref 26.0–34.0)
MCHC: 31.5 g/dL (ref 30.0–36.0)
MCV: 97.7 fL (ref 80.0–100.0)
Platelets: 218 10*3/uL (ref 150–400)
RBC: 3.05 MIL/uL — ABNORMAL LOW (ref 4.22–5.81)
RDW: 13.4 % (ref 11.5–15.5)
WBC: 13.8 10*3/uL — ABNORMAL HIGH (ref 4.0–10.5)
nRBC: 0 % (ref 0.0–0.2)

## 2024-02-07 LAB — BASIC METABOLIC PANEL
Anion gap: 9 (ref 5–15)
BUN: 18 mg/dL (ref 6–20)
CO2: 23 mmol/L (ref 22–32)
Calcium: 8.1 mg/dL — ABNORMAL LOW (ref 8.9–10.3)
Chloride: 104 mmol/L (ref 98–111)
Creatinine, Ser: 0.68 mg/dL (ref 0.61–1.24)
GFR, Estimated: >60 mL/min (ref >60)
Glucose, Bld: 142 mg/dL — ABNORMAL HIGH (ref 70–99)
Potassium: 3.7 mmol/L (ref 3.5–5.1)
Sodium: 136 mmol/L (ref 135–145)

## 2024-02-07 LAB — MAGNESIUM: Magnesium: 2 mg/dL (ref 1.7–2.4)

## 2024-02-07 LAB — PHOSPHORUS: Phosphorus: 2.8 mg/dL (ref 2.5–4.6)

## 2024-02-07 LAB — HEPARIN LEVEL (UNFRACTIONATED)
Heparin Unfractionated: 0.13 [IU]/mL — ABNORMAL LOW (ref 0.30–0.70)
Heparin Unfractionated: 0.13 [IU]/mL — ABNORMAL LOW (ref 0.30–0.70)
Heparin Unfractionated: 0.15 [IU]/mL — ABNORMAL LOW (ref 0.30–0.70)

## 2024-02-07 LAB — ANTITHROMBIN III: AntiThromb III Func: 45 % — ABNORMAL LOW (ref 75–120)

## 2024-02-07 LAB — APTT: aPTT: 33 s (ref 24–36)

## 2024-02-07 MED ORDER — ARGATROBAN 50 MG/50ML IV SOLN
1.0000 ug/kg/min | INTRAVENOUS | Status: DC
  Administered 2024-02-07 – 2024-02-13 (×9): 1 ug/kg/min via INTRAVENOUS
  Filled 2024-02-07 (×14): qty 50

## 2024-02-07 MED ORDER — IOHEXOL 300 MG/ML  SOLN
100.0000 mL | Freq: Once | INTRAMUSCULAR | Status: AC | PRN
  Administered 2024-02-07: 100 mL via INTRAVENOUS

## 2024-02-07 MED ORDER — DEXTROSE 70 % IV SOLN
INTRAVENOUS | Status: AC
  Filled 2024-02-07: qty 1188

## 2024-02-07 MED ORDER — IOHEXOL 300 MG/ML  SOLN
30.0000 mL | Freq: Once | INTRAMUSCULAR | Status: AC | PRN
  Administered 2024-02-07: 30 mL via ORAL

## 2024-02-07 MED ORDER — POTASSIUM CHLORIDE 10 MEQ/100ML IV SOLN
10.0000 meq | INTRAVENOUS | Status: AC
  Administered 2024-02-07 (×3): 10 meq via INTRAVENOUS
  Filled 2024-02-07 (×3): qty 100

## 2024-02-07 MED ORDER — ACETAMINOPHEN 10 MG/ML IV SOLN
1000.0000 mg | Freq: Four times a day (QID) | INTRAVENOUS | Status: AC
  Administered 2024-02-07 – 2024-02-08 (×3): 1000 mg via INTRAVENOUS
  Filled 2024-02-07 (×3): qty 100

## 2024-02-07 MED ORDER — PIPERACILLIN-TAZOBACTAM 3.375 G IVPB
3.3750 g | Freq: Three times a day (TID) | INTRAVENOUS | Status: DC
  Administered 2024-02-07 – 2024-02-13 (×17): 3.375 g via INTRAVENOUS
  Filled 2024-02-07 (×17): qty 50

## 2024-02-07 NOTE — Discharge Instructions (Addendum)
 [facility]      [facility], [place] [phone]  OPEN ABDOMINAL SURGERY: POST OP INSTRUCTIONS  Always review your discharge instruction sheet given to you by the facility where your surgery was performed.  IF YOU HAVE DISABILITY OR FAMILY LEAVE FORMS, YOU MUST BRING THEM TO THE OFFICE FOR PROCESSING.  PLEASE DO NOT GIVE THEM TO YOUR DOCTOR.  A prescription for pain medication may be given to you upon discharge.  Take your pain medication as prescribed, if needed.  If narcotic pain medicine is not needed, then you may take acetaminophen (Tylenol) or ibuprofen (Advil) as needed. Take your usually prescribed medications unless otherwise directed. If you need a refill on your pain medication, please contact your pharmacy. They will contact our office to request authorization.  Prescriptions will not be filled after 5pm or on week-ends. You should follow a light diet the first few days after arrival home, such as soup and crackers, pudding, etc.unless your doctor has advised otherwise. A high-fiber, low fat diet can be resumed as tolerated.   Be sure to include lots of fluids daily. Most patients will experience some swelling and bruising on the chest and neck area.  Ice packs will help.  Swelling and bruising can take several days to resolve Most patients will experience some swelling and bruising in the area of the incision. Ice pack will help. Swelling and bruising can take several days to resolve..  It is common to experience some constipation if taking pain medication after surgery.  Increasing fluid intake and taking a stool softener will usually help or prevent this problem from occurring.  A mild laxative (Milk of Magnesia or Miralax) should be taken according to package directions if there are no bowel movements after 48 hours.  You may have steri-strips (small skin tapes) in place directly over the incision.  These strips should be left on the skin for 7-10 days.  If your surgeon used skin  glue on the incision, you may shower in 24 hours.  The glue will flake off over the next 2-3 weeks.  Any sutures or staples will be removed at the office during your follow-up visit. You may find that a light gauze bandage over your incision may keep your staples from being rubbed or pulled. You may shower and replace the bandage daily. ACTIVITIES:  You may resume regular (light) daily activities beginning the next day--such as daily self-care, walking, climbing stairs--gradually increasing activities as tolerated.  You may have sexual intercourse when it is comfortable.  Refrain from any heavy lifting or straining until approved by your doctor. You may drive when you no longer are taking prescription pain medication, you can comfortably wear a seatbelt, and you can safely maneuver your car and apply brakes Return to Work: ___________________________________ Harry Williams should see your doctor in the office for a follow-up appointment approximately two weeks after your surgery.  Make sure that you call for this appointment within a day or two after you arrive home to insure a convenient appointment time. OTHER INSTRUCTIONS:  _____________________________________________________________ _____________________________________________________________  WHEN TO CALL YOUR DOCTOR: Fever over 101.0 Inability to urinate Nausea and/or vomiting Extreme swelling or bruising Continued bleeding from incision. Increased pain, redness, or drainage from the incision. Difficulty swallowing or breathing Muscle cramping or spasms. Numbness or tingling in hands or feet or around lips.  The clinic staff is available to answer your questions during regular business hours.  Please don't hesitate to call and ask to speak to one of  the nurses if you have concerns.  For further questions, please visit [email or web address]  WOUND CARE: - midline dressing to be changed daily - supplies: sterile saline, gauze, scissors,  tape  - remove dressing and all packing carefully, moistening with sterile saline as needed to avoid packing/internal dressing sticking to the wound. - clean edges of skin around the wound with water/gauze, making sure there is no tape debris or leakage left on skin that could cause skin irritation or breakdown. - dampen and clean gauze with sterile saline and pack wound from wound base to skin level, making sure to take note of any possible areas of wound tracking, tunneling and packing appropriately. Wound can be packed loosely. Trim gauze to size if a whole gauze is not required. - cover wound with a dry gauze and secure with tape.  - write the date/time on the dry dressing/tape to better track when the last dressing change occurred. - change dressing as needed if leakage occurs, wound gets contaminated, or patient requests to shower. - patient may shower daily with wound open (i.e. remove all packing) and following the shower the wound should be dried and a clean dressing placed.   Interventional Radiology Percutaneous Abscess Drain Placement After Care   This sheet gives you information about how to care for yourself after your procedure. Your health care provider may also give you more specific instructions. Your drain was placed by an interventional radiologist with [facility]. If you have questions or concerns, contact [facility] at [phone].   What is a percutaneous drain?   A drain is a small plastic tube (catheter) that goes into the fluid collection in your body through your skin.   How long will I need the drain?   How long the drain needs to stay in is determined by where the drain is, how much comes out of the drain each day and if you are having any other surgical procedures.   Interventional radiology will determine when it is time to remove the drain. It is important to follow up as directed so that the drain can be removed as soon as it is safe to do  so.   What can I expect after the procedure?   After the procedure, it is common to have:   A small amount of bruising and discomfort in the area where the drainage tube (catheter) was placed.   Sleepiness and fatigue. This should go away after the medicines you were given have worn off.   Follow these instructions at home:   Insertion site care   Check your insertion site when you change the bandage. Check for:   More redness, swelling, or pain.   More fluid or blood.   Warmth.   Pus or a bad smell.   When caring for your insertion site:   Wash your hands with soap and water for at least 20 seconds before and after you change your bandage (dressing). If soap and water are not available, use hand sanitizer.   You do not need to change your dressing everyday if it is clean and dry. Change your dressing every 3 days or as needed when it is soiled, wet or becoming dislodged. You will need to change your dressing each time you shower.   Leave stitches (sutures), skin glue, or adhesive strips in place. These skin closures may need to stay in place for 2 weeks or longer. If adhesive strip edges start to loosen and curl up, you  may trim the loose edges. Do not remove adhesive strips completely unless your health care provider tells you to do so.   Catheter care   Flush the catheter once per day with 5 mL of 0.9% normal saline unless you are told otherwise by your healthcare provider. This helps to prevent clogs in the catheter.   To disconnect the drain, turn the clear plastic tube to the left. Attach the saline syringe by placing it on the white end of the drain and turning gently to the right. Once attached gently push the plunger to the 5 mL mark. After you are done flushing, disconnect the syringe by turning to the left and reattach your drainage container   If you have a bulb please be sure the bulb is charged after reconnecting it - to do this pinch the bulb between your thumb  and first finger and close the stopper located on the top of the bulb.    Check for fluid leaking from around your catheter (instead of fluid draining through your catheter). This may be a sign that the drain is no longer working correctly.   Write down the following information every time you empty your bag:   The date and time.   The amount of drainage.   Activity   Rest at home for 1-2 days after your procedure.   For the first 48 hours do not lift anything more than 10 lbs (about a gallon of milk). You may perform moderate activities/exercise. Please avoid strenuous activities during this time.   Avoid any activities which may pull on your drain as this can cause your drain to become dislodged.   If you were given a sedative during the procedure, it can affect you for several hours. Do not drive or operate machinery until your health care provider says that it is safe.   General instructions   For mild pain take over-the-counter medications as needed for pain such as Tylenol or Advil. If you are experiencing severe pain please call our office as this may indicate an issue with your drain.    If you were prescribed an antibiotic medicine, take it as told by your health care provider. Do not stop using the antibiotic even if you start to feel better.   You may shower 24 hours after the drain is placed. To do this cover the insertion site with a water tight material such as saran wrap and seal the edges with tape, you may also purchase waterproof dressings at your local drug store. Shower as usual and then remove the water tight dressing and any gauze/tape underneath it once you have exited the shower and dried off. Allow the area to air dry or pat dry with a clean towel. Once the skin is completely dry place a new gauze dressing. It is important to keep the site dry at all times to prevent infection.   Do not submerge the drain - this means you cannot take baths, swim, use a hot tub,  etc. until the drain is removed.    Do not use any products that contain nicotine or tobacco, such as cigarettes, e-cigarettes, and chewing tobacco. If you need help quitting, ask your health care provider.   Keep all follow-up visits as told by your health care provider. This is important.   Contact a health care provider if:   You have less than 10 mL of drainage a day for 2-3 days in a row, or as directed by your  health care provider.   You have any of these signs of infection:   More redness, swelling, or pain around your incision area.   More fluid or blood coming from your incision area.   Warmth coming from your incision area.   Pus or a bad smell coming from your incision area.   You have fluid leaking from around your catheter (instead of through your catheter).   You are unable to flush the drain.   You have a fever or chills.   You have pain that does not get better with medicine.   You have not been contacted to schedule a drain follow up appointment within 10 days of discharge from the hospital.   Please call [facility] at [phone] with any questions or concerns.   Get help right away if:   Your catheter comes out.   You suddenly stop having drainage from your catheter.   You suddenly have blood in the fluid that is draining from your catheter.   You become dizzy or you faint.   You develop a rash.   You have nausea or vomiting.   You have difficulty breathing or you feel short of breath.   You develop chest pain.   You have problems with your speech or vision.   You have trouble balancing or moving your arms or legs.   Summary   It is common to have a small amount of bruising and discomfort in the area where the drainage tube (catheter) was placed. You may also have minor discomfort with movement while the drain is in place.   Flush the drain once per day with 5 mL of 0.9% normal saline (unless you were told otherwise by your  healthcare provider).    Record the amount of drainage from the bag every time you empty it.   Change the dressing every 3 days or earlier if soiled/wet. Keep the skin dry under the dressing.   You may shower with the drain in place. Do not submerge the drain (no baths, swimming, hot tubs, etc.).   Contact [facility] at [phone] if you have more redness, swelling, or pain around your incision area or if you have pain that does not get better with medicine.   This information is not intended to replace advice given to you by your health care provider. Make sure you discuss any questions you have with your health care provider.   Document Revised: 02/18/2022 Document Reviewed: 11/10/2019   Elsevier Patient Education  2023 [employer]         Interventional Radiology Drain Record   Empty your drain at least once per day. You may empty it as often as needed. Use this form to write down the amount of fluid that has collected in the drainage container. Bring this form with you to your follow-up visits. Please call [facility] at [phone] with any questions or concerns prior to your appointment.   Drain #1 location: ___________________   Date __________ Time __________ Amount __________   Date __________ Time __________ Amount __________   Date __________ Time __________ Amount __________   Date __________ Time __________ Amount __________   Date __________ Time __________ Amount __________   Date __________ Time __________ Amount __________   Date __________ Time __________ Amount __________   Date __________ Time __________ Amount __________   Date __________ Time __________ Amount __________   Date __________ Time __________ Amount __________   Date __________ Time __________  Amount __________   Date __________ Time __________ Amount __________   Date __________ Time __________ Amount __________   Date __________ Time __________ Amount  __________

## 2024-02-07 NOTE — Plan of Care (Signed)

## 2024-02-07 NOTE — Progress Notes (Signed)
 Pharmacy Antibiotic Note  Race Harry Williams is a 43 y.o. male admitted on 01/30/2024 with intra-abdominal infection.  Pharmacy has been consulted for Zosyn dosing.  Spiked a fever today. WBC increasing slightly.   Plan: Zosyn 3.375 gm IV q8h (4 hour infusion) Monitor clinical picture, renal function F/U C&S, abx deescalation / LOT  Height: 5\' 8"  (172.7 cm) Weight: 70.3 kg (155 lb) IBW/kg (Calculated) : 68.4  Temp (24hrs), Avg:99.4 F (37.4 C), Min:97.9 F (36.6 C), Max:101.2 F (38.4 C)  Recent Labs  Lab 02/03/24 0512 02/04/24 0728 02/05/24 0112 02/06/24 0457 02/07/24 0020  WBC 5.0 6.0 8.4 11.3* 13.8*  CREATININE 1.22 1.04 1.03 0.95 0.68    Estimated Creatinine Clearance: 117.6 mL/min (by C-G formula based on SCr of 0.68 mg/dL).    No Known Allergies  Antimicrobials this admission: Zosyn 3/11 >>   Dose adjustments this admission:   Microbiology results: 3/10 BCx: ngtd  Thank you for allowing pharmacy to be a part of this patient's care.  Armandina Stammer 02/07/2024 3:22 PM

## 2024-02-07 NOTE — Progress Notes (Signed)
 6 Days Post-Op   Subjective/Chief Complaint: Small bowel movement yesterday. No blood. He isn't sure if bloating is improved, pain is about the same but he appears more comfortable. His G tube was never changed to gravity from suction yesterday.    Objective: Vital signs in last 24 hours: Temp:  [97.9 F (36.6 C)-99.5 F (37.5 C)] 99.1 F (37.3 C) (03/11 0522) Pulse Rate:  [91-107] 107 (03/11 0522) Resp:  [15-20] 15 (03/11 0522) BP: (123-134)/(72-76) 134/73 (03/11 0522) SpO2:  [98 %-100 %] 99 % (03/11 0522) Last BM Date : 02/06/24  Intake/Output from previous day: 03/10 0701 - 03/11 0700 In: 0  Out: 3751 [Urine:1000; Drains:2750; Stool:1] Intake/Output this shift: No intake/output data recorded.  General appearance: alert and cooperative Resp: clear to auscultation bilaterally Cardio: regular rate and rhythm GI: soft, moderate diffuse tenderness.  Incision is clean and dry with honeycomb in place.  G tube to suction with bilious effluent  Lab Results:  Recent Labs    02/06/24 0457 02/07/24 0020  WBC 11.3* 13.8*  HGB 10.2* 9.4*  HCT 32.3* 29.8*  PLT 238 218   BMET Recent Labs    02/06/24 0457 02/07/24 0020  NA 135 136  K 3.6 3.7  CL 101 104  CO2 26 23  GLUCOSE 141* 142*  BUN 16 18  CREATININE 0.95 0.68  CALCIUM 8.1* 8.1*   PT/INR No results for input(s): "LABPROT", "INR" in the last 72 hours. ABG No results for input(s): "PHART", "HCO3" in the last 72 hours.  Invalid input(s): "PCO2", "PO2"  Studies/Results: DG CHEST PORT 1 VIEW Result Date: 02/06/2024 CLINICAL DATA:  Fevers EXAM: PORTABLE CHEST 1 VIEW COMPARISON:  11/01/2023 FINDINGS: Cardiac shadow is within normal limits. Lungs are well aerated bilaterally. Right chest wall port is again seen. No acute bony abnormality is noted. IMPRESSION: No active disease. Electronically Signed   By: Alcide Clever M.D.   On: 02/06/2024 10:20    Anti-infectives: Anti-infectives (From admission, onward)    None        Assessment/Plan: s/p Procedure(s) with comments: EXPLORATORY LAPAROTOMY, LYSIS OF ADHESIONS, SMALL BOWEL RESECTION (N/A) - LYSIS OF ADHESIONS, POSSIBLE SMALL BOWEL RESECTION I clamped his g tube myself this morning. It was not changed to gravity yesterday as ordered. Patient will let us know if he feels worse with this and it can be placed to gravity if needed.  Persistent mild tachycardia for the last several days, suspect he is somewhat fluid down given high G-tube output and gave IV fluid bolus 3/10; white count is up slightly today again. Tmax 99.5.  Will proceed with CT today to rule out abscess etc that may be contributing to his ileus, but given amount of bowel manipulation intra-op I think prolonged ileus is not surprising. 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

## 2024-02-07 NOTE — Progress Notes (Signed)
 PHARMACY - ANTICOAGULATION CONSULT NOTE  Pharmacy Consult for heparin to argatroban Indication: hx right internal jugular (IJ) vein thromboembolism  No Known Allergies  Patient Measurements: Height: 5\' 8"  (172.7 cm) Weight: 70.3 kg (155 lb) IBW/kg (Calculated) : 68.4 Heparin Dosing Weight: 70 kg  Vital Signs: Temp: 99.1 F (37.3 C) (03/11 0522) BP: 134/73 (03/11 0522) Pulse Rate: 107 (03/11 0522)  Labs: Recent Labs    02/05/24 0112 02/05/24 1052 02/06/24 0457 02/06/24 0842 02/07/24 0020 02/07/24 0525 02/07/24 1235  HGB 10.3*  --  10.2*  --  9.4*  --   --   HCT 32.4*  --  32.3*  --  29.8*  --   --   PLT 270  --  238  --  218  --   --   HEPARINUNFRC 0.12*   < >  --    < > 0.13* 0.13* 0.15*  CREATININE 1.03  --  0.95  --  0.68  --   --    < > = values in this interval not displayed.    Estimated Creatinine Clearance: 117.6 mL/min (by C-G formula based on SCr of 0.68 mg/dL).   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 He presented to the ED on 01/30/24 with c/o n/v and abdominal pain. He was subsequently found to have SBO and underwent exp lap with small bowel resection, repair of small bowel serosa and lysis of adhesions on 02/01/24. Heparin drip started on admission for internal jugular thrombus and d/ced on 02/01/24 for surgery. Pharmacy has  been consulted on 02/04/24 to resume heparin drip back with no bolus. Transition heparin to argatroban on 02/07/2024 given ongoing subtherapeutic heparin levels.   Today, 02/07/2024: - heparin level remains sub-therapeutic at 0.15 despite rate increased to 2700 units/hr.   - CBC: Hg 9.4- low, trending down; pltc WNL - Per pt's RN, no issues with line and no bleeding noted - given 9th sub-therapeutic HL and very high dose IV heparin, antithrombin III ordered to evaluate for heparin resistance  Goal of Therapy:  aPTT 50-90 sec Monitor platelets by anticoagulation protocol: Yes   Plan:  - d/c heparin drip - start argatroban IV at 1 mcg/kg/min - check 4 hr aPTT  - antithrombin III ordered to evaluate for heparin resistance - monitor for s/sx bleeding  - daily CBC and aPTT  [name], Student-PharmD 02/07/2024, 1:26 PM

## 2024-02-07 NOTE — Progress Notes (Signed)
 PHARMACY - TOTAL PARENTERAL NUTRITION CONSULT NOTE   Indication: Prolonged ileus  Patient Measurements: Height: 5\' 8"  (172.7 cm) Weight: 70.3 kg (155 lb) IBW/kg (Calculated) : 68.4 TPN AdjBW (KG): 70.3 Body mass index is 23.57 kg/m. Usual Weight:   Assessment: 42 yoM presented on 3/3 with vomiting and abdominal pain found to have SBO.  PMH significant for total colectomy and J-pouch placement at [facility] because of multiple polyps in his colon, tonsillar cancer, and internal jugular VTE.  He has been NPO since 3/3 and Pharmacy is consulted to dose TPN for ongoing ileus.   Glucose / Insulin: No hx DM, A1c 5.1.   - CBGs/SSI Dc'd on 3/10. Serum glucose < 150 Electrolytes: Elytes WNL, including CorrCa 9.54, K 3.7 after 4 runs yesterday- goal >=4  Renal: SCr WNL, BUN WNL Hepatic: AST low, others WNL (3/10) Trig 288 (3/10)  Intake / Output: - mIVF: none - Output: drains (Gtube) -2750 mL, urine -1000 mL, 1 stool on 3/10 GI Imaging: 3/8 KUB: No abnormal bowel dilatation. Possible right renal calculus. GI Surgeries / Procedures:  3/5 Small bowel resection  Central access: Implanted port TPN start date: 3/8   Nutritional Goals: Goal TPN rate is 90 mL/hr (provides 119 g of protein and 2372 kcals per day)  RD Assessment: pending Estimated Needs Total Energy Estimated Needs: 2300-2500 Total Protein Estimated Needs: 115-130g Total Fluid Estimated Needs: >/=2L  Current Nutrition:  NPO and TPN  Plan: Now: 3 runs of K At 1800:  continue TPN @ goal rate 53mL/hr Electrolytes in TPN: Na 36mEq/L, K 50mEq/L, Ca 3mEq/L, Mg 36mEq/L, and Phos 37mmol/L. Cl:Ac 1:1 Add standard MVI and trace elements to TPN Monitor TPN labs on Mon/Thurs, BMET in AM  Thiamine 100mg  IV daily x 5 days (LD 3/13)  Herby Abraham, Pharm.D Use secure chat for questions 02/07/2024 7:22 AM

## 2024-02-07 NOTE — Progress Notes (Signed)
 PROGRESS NOTE    Harry Williams  ZOX:[id number] DOB: May 15, 1981 DOA: 01/30/2024 PCP: [name], MD  Chief Complaint  Patient presents with   Emesis   Abdominal Pain    Brief Narrative:   43 yo with hx malignant neoplasm of the tonsillar fossa s/p chemoradiation, oral mucositis due to radiation therapy, dysphagia s/p PEG tube.  FAP s/p total colectomy in 2016, R internal jugular thrombus and multiple other medical issues here with nausea, vomiting, abdominal pain and found to have Ferrin Liebig small bowel obstruction.   Assessment & Plan:   Principal Problem:   SBO (small bowel obstruction) (HCC) Active Problems:   Malignant neoplasm of tonsillar fossa (HCC)   AKI (acute kidney injury) (HCC)   Hyponatremia   Hyperkalemia   Hypochloremia  High Grade Small Bowel Obstruction S/p Total Colectomy for FAP in 2016 S/p PEG Appreciate general surgery's assistance - s/p ex lap with small bowel resection, repair of small bowel serosa x1, lysis of adhesions.  Back on heparin gtt.  Monitor H/H. Plain films without bowel dilatation Post op care per general surgery GT to gravity per surgery, antiemetics, bowel rest.  TPN per surgery.  PCA for pain.   Post op course complicated by ileus and now developing fevers -> on abx as noted below  Fever Blood cultures UA not concerning for UTI CXR without active disease Surgery to consider repeat CT scan tmrw  Antibiotics to cover intraabdominal process given his recent surgery and recurrent fevers  Acute Kidney Injury Baseline creatinine < 1.0 Continue IVF, additional workup if not improving CT without hydro   Sinus Tachycardia Mild, follow - due to above Will ctm on tele  Dysuria UA not concerning for UTI  Right internal jugular Thrombus related to Port  Hasn't been on anticoagulation prior to admission due to issues with cost Needs anticoagulation rx at discharge, will need continued close follow up outpatient Anticoagulation resumed 3/8  -> transitioned to argatroban today due to heparin not therapeutic Consider repeat CT with contrast to reassess clot burden, oncology had planned for this in March  Anemia Noted, will trend post op  Malignant Neoplasm of Tonsillar Fossa Referred to oncology in Oct 2024 - treated with chemoradiation (received 4 doses of weekly cisplatin, rest held due to neutropenia and patient preference). Following with Dr. [name] from oncology  T wave inversion Leads II, III, aVF and V5, V6 Negative troponin Noted, consider echo   Asthma Not in exacerbation  Hyperglycemia Follow A1c (5.1)    DVT prophylaxis: heparin gtt Code Status: full Family Communication: none at bedside Disposition:   Status is: Inpatient Remains inpatient appropriate because: need for ongoing care, surgery   Consultants:  surgery  Procedures:  none  Antimicrobials:  Anti-infectives (From admission, onward)    Start     Dose/Rate Route Frequency Ordered Stop   02/07/24 1615  piperacillin-tazobactam (ZOSYN) IVPB 3.375 g        3.375 g 12.5 mL/hr over 240 Minutes Intravenous Every 8 hours 02/07/24 1522         Subjective: Passing some stool C/o abdominal distension after tube clamped  Objective: Vitals:   02/07/24 0522 02/07/24 0816 02/07/24 1419 02/07/24 1619  BP: 134/73  137/79   Pulse: (!) 107  (!) 106   Resp: 15 19  18   Temp: 99.1 F (37.3 C)  (!) 101.2 F (38.4 C)   TempSrc:      SpO2: 99%  100%   Weight:      Height:  Intake/Output Summary (Last 24 hours) at 02/07/2024 1653 Last data filed at 02/07/2024 1641 Gross per 24 hour  Intake 3137.46 ml  Output 2650 ml  Net 487.46 ml   Filed Weights   01/30/24 1718 01/30/24 1719  Weight: 70.3 kg 70.3 kg    Examination:  General: No acute distress. Cardiovascular: RRR Lungs: unlabored Abdomen: increased distension today, moderate tenderness Neurological: Alert and oriented 3. Moves all extremities 4 with equal strength.  Cranial nerves II through XII grossly intact. Extremities: No clubbing or cyanosis. No edema.   Data Reviewed: I have personally reviewed following labs and imaging studies  CBC: Recent Labs  Lab 02/03/24 0512 02/04/24 0728 02/05/24 0112 02/06/24 0457 02/07/24 0020  WBC 5.0 6.0 8.4 11.3* 13.8*  NEUTROABS 3.7  --   --   --   --   HGB 10.7* 10.8* 10.3* 10.2* 9.4*  HCT 33.2* 33.0* 32.4* 32.3* 29.8*  MCV 94.9 93.0 94.7 96.1 97.7  PLT 232 255 270 238 218    Basic Metabolic Panel: Recent Labs  Lab 02/03/24 0512 02/04/24 0728 02/05/24 0112 02/06/24 0457 02/07/24 0020  NA 138 136 136 135 136  K 3.6 3.3* 4.0 3.6 3.7  CL 99 97* 99 101 104  CO2 31 30 29 26 23   GLUCOSE 104* 116* 129* 141* 142*  BUN 21* 16 14 16 18   CREATININE 1.22 1.04 1.03 0.95 0.68  CALCIUM 8.0* 8.2* 8.1* 8.1* 8.1*  MG 2.3 2.2 2.1 2.0 2.0  PHOS 1.6* 2.7 2.7 3.4 2.8    GFR: Estimated Creatinine Clearance: 117.6 mL/min (by C-G formula based on SCr of 0.68 mg/dL).  Liver Function Tests: Recent Labs  Lab 02/03/24 0512 02/04/24 0728 02/05/24 0112 02/06/24 0457  AST 14* 12* 9* 10*  ALT 10 8 8 8   ALKPHOS 51 53 52 59  BILITOT 0.7 0.9 0.8 0.4  PROT 5.3* 5.4* 5.5* 5.5*  ALBUMIN 2.6* 2.4* 2.3* 2.2*    CBG: Recent Labs  Lab 02/04/24 1539 02/04/24 2244 02/05/24 0800 02/05/24 1603 02/05/24 2311  GLUCAP 105* 109* 127* 128* 144*     Recent Results (from the past 240 hours)  Culture, blood (Routine X 2) w Reflex to ID Panel     Status: None (Preliminary result)   Collection Time: 02/06/24  8:42 AM   Specimen: BLOOD RIGHT ARM  Result Value Ref Range Status   Specimen Description   Final    BLOOD RIGHT ARM Performed at [facility], [address], [place], [place] [address]    Special Requests   Final    BOTTLES DRAWN AEROBIC AND ANAEROBIC Blood Culture adequate volume Performed at [facility], [address], [place], [place] [address]    Culture   Final    NO GROWTH  < 24 HOURS Performed at [facility], [address], [place], [place] [address]    Report Status PENDING  Incomplete  Culture, blood (Routine X 2) w Reflex to ID Panel     Status: None (Preliminary result)   Collection Time: 02/06/24  8:42 AM   Specimen: BLOOD RIGHT HAND  Result Value Ref Range Status   Specimen Description   Final    BLOOD RIGHT HAND Performed at [facility], [address] [place], [place] [address]    Special Requests   Final    BOTTLES DRAWN AEROBIC AND ANAEROBIC Blood Culture adequate volume Performed at [facility], [address] [place], [place] [address]  Culture   Final    NO GROWTH < 24 HOURS Performed at [facility], [address] [place], [place] [address]    Report Status PENDING  Incomplete         Radiology Studies: DG CHEST PORT 1 VIEW Result Date: 02/06/2024 CLINICAL DATA:  Fevers EXAM: PORTABLE CHEST 1 VIEW COMPARISON:  11/01/2023 FINDINGS: Cardiac shadow is within normal limits. Lungs are well aerated bilaterally. Right chest wall port is again seen. No acute bony abnormality is noted. IMPRESSION: No active disease. Electronically Signed   By: Alcide Clever M.D.   On: 02/06/2024 10:20         Scheduled Meds:  Chlorhexidine Gluconate Cloth  6 each Topical Daily   HYDROmorphone   Intravenous Q4H   methocarbamol (ROBAXIN) injection  500 mg Intravenous Q8H   sodium chloride flush  10-40 mL Intracatheter Q12H   thiamine (VITAMIN B1) injection  100 mg Intravenous Daily   Continuous Infusions:  acetaminophen 1,000 mg (02/07/24 1616)   argatroban 1 mcg/kg/min (02/07/24 1641)   piperacillin-tazobactam (ZOSYN)  IV     TPN ADULT (ION) 90 mL/hr at 02/07/24 1641   TPN ADULT (ION)        LOS: 7 days    Time spent: over 30 min    [name], MD [facility]   To contact the attending provider between 7A-7P or the covering provider during after hours 7P-7A, please log into the  web site [email or web address] and access using universal [place] password for that web site. If you do not have the password, please call the hospital operator.  02/07/2024, 4:53 PM

## 2024-02-07 NOTE — Progress Notes (Signed)
 PHARMACY - ANTICOAGULATION CONSULT NOTE  Pharmacy Consult for heparin to argatroban Indication: hx right internal jugular (IJ) vein thromboembolism  No Known Allergies  Patient Measurements: Height: 5\' 8"  (172.7 cm) Weight: 70.3 kg (155 lb) IBW/kg (Calculated) : 68.4 Heparin Dosing Weight: 70 kg  Vital Signs: Temp: 97.5 F (36.4 C) (03/11 1932) BP: 129/73 (03/11 1932) Pulse Rate: 90 (03/11 1932)  Labs: Recent Labs    02/05/24 0112 02/05/24 1052 02/06/24 0457 02/06/24 0842 02/07/24 0020 02/07/24 0525 02/07/24 1235 02/07/24 1929  HGB 10.3*  --  10.2*  --  9.4*  --   --   --   HCT 32.4*  --  32.3*  --  29.8*  --   --   --   PLT 270  --  238  --  218  --   --   --   APTT  --   --   --   --   --   --   --  33  HEPARINUNFRC 0.12*   < >  --    < > 0.13* 0.13* 0.15*  --   CREATININE 1.03  --  0.95  --  0.68  --   --   --    < > = values in this interval not displayed.    Estimated Creatinine Clearance: 117.6 mL/min (by C-G formula based on SCr of 0.68 mg/dL).   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 He presented to the ED on 01/30/24 with c/o n/v and abdominal pain. He was subsequently found to have SBO and underwent exp lap with small bowel resection, repair of small bowel serosa and lysis of adhesions on 02/01/24. Heparin drip started on  admission for internal jugular thrombus and d/ced on 02/01/24 for surgery. Pharmacy has been consulted on 02/04/24 to resume heparin drip back with no bolus. Transition heparin to argatroban on 02/07/2024 given ongoing subtherapeutic heparin levels.   Today, 02/07/2024: - heparin level remains sub-therapeutic at 0.15 despite rate increased to 2700 units/hr.   - CBC: Hg 9.4- low, trending down; pltc WNL - Per pt's RN, no issues with line and no bleeding noted - given 9th sub-therapeutic HL and very high dose IV heparin, antithrombin III ordered to evaluate for heparin resistance  This evening, 02/07/2024 19:29 aPTT 33 which is below goal of 50-90 sec with argatroban infusing at 1 mcg/kg/min (4.22 ml/hr) Per nurse,  IV had become occluded and was restarted at approximately 20:50 this evening No bleeding  Goal of Therapy:  aPTT 50-90 sec Monitor platelets by anticoagulation protocol: Yes   Plan:  - Continue infusion rate of argatroban IV at 1 mcg/kg/min - Repeat aPTT 4 hours after restart of IV argatroban - Monitor daily CBC & aPTT along with sign/symptoms of bleeding    Thank you for allowing pharmacy to be a  part of this patient's care.  Selinda Eon, PharmD, BCPS Clinical Pharmacist [place] 02/07/2024 9:11 PM

## 2024-02-07 NOTE — Progress Notes (Addendum)
 PHARMACY - ANTICOAGULATION CONSULT NOTE  Pharmacy Consult for heparin  Indication: hx right internal jugular (IJ) vein thromboembolism  No Known Allergies  Patient Measurements: Height: 5\' 8"  (172.7 cm) Weight: 70.3 kg (155 lb) IBW/kg (Calculated) : 68.4 Heparin Dosing Weight: 70 kg  Vital Signs: Temp: 97.9 F (36.6 C) (03/10 1932) BP: 123/76 (03/10 1932) Pulse Rate: 91 (03/10 1932)  Labs: Recent Labs    02/05/24 0112 02/05/24 1052 02/06/24 0457 02/06/24 0842 02/06/24 1722 02/07/24 0020  HGB 10.3*  --  10.2*  --   --  9.4*  HCT 32.4*  --  32.3*  --   --  29.8*  PLT 270  --  238  --   --  218  HEPARINUNFRC 0.12*   < >  --  0.23* 0.15* 0.13*  CREATININE 1.03  --  0.95  --   --  0.68   < > = values in this interval not displayed.    Estimated Creatinine Clearance: 117.6 mL/min (by C-G formula based on SCr of 0.68 mg/dL).   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 He presented to the ED on 01/30/24 with c/o n/v and abdominal pain. He was subsequently found to have SBO and underwent exp lap with small bowel resection, repair of small bowel serosa and lysis of adhesions on 02/01/24. Heparin drip started on admission for internal jugular thrombus and d/ced on 02/01/24 for surgery. Pharmacy has been consulted on 02/04/24 to resume heparin drip back  with no bolus.   Today, 02/07/2024: - heparin level 0.13- remains sub-therapeutic & trending down despite rate increased to 2450 units/hr.  RN noted IV was occluded at shift change  ("has been beeping all day" per pt report).  New IV site established ~ 9p.  Heparin is now infusing via R hand.  Phlebotomist has been drawing labs.  - CBC: Hg 9.4- low, trending down; pltc WNL - Per pt's RN, no bleeding noted  Goal of Therapy:  Heparin level 0.3-0.7 units/ml Monitor platelets by anticoagulation protocol: Yes   Plan:  - Continue heparin drip at 2450 units/hr - check heparin level at 5a (8h after heparin restarted at new IV site) - monitor for s/sx bleeding   Junita Push, PharmD, BCPS 02/07/2024, 1:22 AM  Addendum: -Heparin level still 0.13 on 2450 units/hr.   -Pt remains without bleeding or infusion interruptions since IV site change. Plan: - Increase heparin rate to 2700 units/hr -Recheck heparin level in 6h  Junita Push, PharmD, BCPS 02/07/2024@6 :24 AM

## 2024-02-08 ENCOUNTER — Inpatient Hospital Stay (HOSPITAL_COMMUNITY)

## 2024-02-08 DIAGNOSIS — N179 Acute kidney failure, unspecified: Secondary | ICD-10-CM | POA: Diagnosis not present

## 2024-02-08 DIAGNOSIS — K56609 Unspecified intestinal obstruction, unspecified as to partial versus complete obstruction: Secondary | ICD-10-CM | POA: Diagnosis not present

## 2024-02-08 HISTORY — PX: IR REPLC GASTRO/COLONIC TUBE PERCUT W/FLUORO: IMG2333

## 2024-02-08 LAB — BASIC METABOLIC PANEL
Anion gap: 11 (ref 5–15)
BUN: 16 mg/dL (ref 6–20)
CO2: 21 mmol/L — ABNORMAL LOW (ref 22–32)
Calcium: 8.2 mg/dL — ABNORMAL LOW (ref 8.9–10.3)
Chloride: 105 mmol/L (ref 98–111)
Creatinine, Ser: 0.71 mg/dL (ref 0.61–1.24)
GFR, Estimated: >60 mL/min (ref >60)
Glucose, Bld: 110 mg/dL — ABNORMAL HIGH (ref 70–99)
Potassium: 3.7 mmol/L (ref 3.5–5.1)
Sodium: 137 mmol/L (ref 135–145)

## 2024-02-08 LAB — CBC
HCT: 33.3 % — ABNORMAL LOW (ref 39.0–52.0)
Hemoglobin: 10 g/dL — ABNORMAL LOW (ref 13.0–17.0)
MCH: 30.6 pg (ref 26.0–34.0)
MCHC: 30 g/dL (ref 30.0–36.0)
MCV: 101.8 fL — ABNORMAL HIGH (ref 80.0–100.0)
Platelets: 227 10*3/uL (ref 150–400)
RBC: 3.27 MIL/uL — ABNORMAL LOW (ref 4.22–5.81)
RDW: 13.8 % (ref 11.5–15.5)
WBC: 15.9 10*3/uL — ABNORMAL HIGH (ref 4.0–10.5)
nRBC: 0 % (ref 0.0–0.2)

## 2024-02-08 LAB — APTT
aPTT: 53 s — ABNORMAL HIGH (ref 24–36)
aPTT: 71 s — ABNORMAL HIGH (ref 24–36)

## 2024-02-08 MED ORDER — IOHEXOL 300 MG/ML  SOLN
50.0000 mL | Freq: Once | INTRAMUSCULAR | Status: AC | PRN
  Administered 2024-02-08: 50 mL

## 2024-02-08 MED ORDER — POTASSIUM CHLORIDE 10 MEQ/100ML IV SOLN
10.0000 meq | INTRAVENOUS | Status: AC
  Administered 2024-02-08 (×3): 10 meq via INTRAVENOUS
  Filled 2024-02-08: qty 100

## 2024-02-08 MED ORDER — TRAVASOL 10 % IV SOLN
INTRAVENOUS | Status: AC
  Filled 2024-02-08: qty 1188

## 2024-02-08 MED ORDER — ACETAMINOPHEN 10 MG/ML IV SOLN
1000.0000 mg | Freq: Once | INTRAVENOUS | Status: AC
Start: 2024-02-08 — End: 2024-02-08
  Administered 2024-02-08: 1000 mg via INTRAVENOUS
  Filled 2024-02-08: qty 100

## 2024-02-08 NOTE — Progress Notes (Signed)
 7 Days Post-Op   Subjective/Chief Complaint: Another bowel movement overnight. Denies nausea. Pain is maybe slightly better; worst pain is searing lower abdominal pain when he urinates. New purulent/malodoroous drainage from incision. The port on his G tube is cracked and leaking.    Objective: Vital signs in last 24 hours: Temp:  [97.5 F (36.4 C)-101.2 F (38.4 C)] 98.8 F (37.1 C) (03/12 0452) Pulse Rate:  [90-106] 90 (03/11 1932) Resp:  [15-18] 15 (03/12 0805) BP: (129-137)/(73-79) 133/78 (03/12 0452) SpO2:  [96 %-100 %] 96 % (03/12 0805) FiO2 (%):  [25 %] 25 % (03/12 0805) Last BM Date : 02/07/24  Intake/Output from previous day: 03/11 0701 - 03/12 0700 In: 3406.7 [I.V.:3289.8; IV Piggyback:116.9] Out: 250 [Urine:250] Intake/Output this shift: Total I/O In: -  Out: 150 [Drains:150]  General appearance: alert and cooperative Resp: clear to auscultation bilaterally Cardio: regular rate and rhythm GI: soft, moderate diffuse tenderness, distention somewhat improved. Purulent fluid under honeycomb. All staples removed and copious purulent drainage evacuated. Wound lightly packed and covered with clean gauze.   G tube to gravity with bilious effluent, tape around port/connection  Lab Results:  Recent Labs    02/07/24 0020 02/08/24 0038  WBC 13.8* 15.9*  HGB 9.4* 10.0*  HCT 29.8* 33.3*  PLT 218 227   BMET Recent Labs    02/07/24 0020 02/08/24 0038  NA 136 137  K 3.7 3.7  CL 104 105  CO2 23 21*  GLUCOSE 142* 110*  BUN 18 16  CREATININE 0.68 0.71  CALCIUM 8.1* 8.2*   PT/INR No results for input(s): "LABPROT", "INR" in the last 72 hours. ABG No results for input(s): "PHART", "HCO3" in the last 72 hours.  Invalid input(s): "PCO2", "PO2"  Studies/Results: CT ABDOMEN PELVIS W CONTRAST Result Date: 02/07/2024 CLINICAL DATA:  Postoperative abdominal pain EXAM: CT ABDOMEN AND PELVIS WITH CONTRAST TECHNIQUE: Multidetector CT imaging of the abdomen and pelvis was  performed using the standard protocol following bolus administration of intravenous contrast. RADIATION DOSE REDUCTION: This exam was performed according to the departmental dose-optimization program which includes automated exposure control, adjustment of the mA and/or kV according to patient size and/or use of iterative reconstruction technique. CONTRAST:  OMNIPAQUE IOHEXOL 300 MG/ML SOLN, 30mL OMNIPAQUE IOHEXOL 300 MG/ML SOLN COMPARISON:  01/30/2024 FINDINGS: Lower chest: Bibasilar atelectasis. Central venous catheter tip noted within the superior right atrium. No acute abnormality. Hepatobiliary: No focal liver abnormality is seen. No gallstones, gallbladder wall thickening, or biliary dilatation. Pancreas: Unremarkable Spleen: Unremarkable Adrenals/Urinary Tract: Adrenal glands are unremarkable. Kidneys are normal, without renal calculi, focal lesion, or hydronephrosis. Bladder is unremarkable. Stomach/Bowel: Surgical changes of probable subtotal colectomy are again identified. There has been interval partial small bowel resection with anastomotic staple line seen within the mid abdomen. Punctate foci of free intraperitoneal gas or likely postoperative in nature. There is infiltration of the omentum which is nonspecific in the immediate postoperative setting. Similarly, peritoneal enhancement may relate to peritoneal inflammation or simply represent postoperative change. There are multiple loculated fluid collections within the abdomen demonstrating some degree of organization and rim enhancement: Right upper quadrant, axial image # 35/2, 5.7 x 3.2 cm. Left mid abdominal mesenteric folds, axial image # 50/2, 3.0 x 5.0 cm Right lower quadrant, axial image # 62/2, 1.5 x 4.6 cm Pelvis, axial image # 71/2, 9.4 x 12.8 x 6.6 cm (volume = 420 cm^3) There is tethering of bowel in the region of the small bowel resection, best appreciated on coronal image #  30/6 with areas of fat necrosis in this region, best  appreciated on image # 31/6. Several relatively narrowed loops of bowel, however, suggest an underlying partial small bowel obstruction. Vascular/Lymphatic: No significant vascular findings are present. No enlarged abdominal or pelvic lymph nodes. Reproductive: Prostate is unremarkable. Other: Mild diffuse subcutaneous body wall edema. Musculoskeletal: Bilateral L5 pars defects are present with grade 1 anterolisthesis L5-S1. No acute bone abnormality. IMPRESSION: 1. Interval partial small bowel resection with anastomotic staple line seen within the mid abdomen. Punctate foci of free intraperitoneal gas are likely postoperative in nature. 2. Multiple loculated fluid collections within the abdomen and pelvis demonstrating some degree of organization and rim enhancement, the largest within the pelvis measuring up to 12.8 cm in greatest dimension (volume = 420 cc). These may represent postoperative seromas, contain bowel leak, or abscesses. 3. Tethering of bowel in the region of the small bowel resection with areas of fat necrosis in this region. Several relatively narrowed loops of bowel, however, suggest an underlying partial small bowel obstruction. 4. Infiltration of the omentum and peritoneal enhancement which may relate to peritoneal inflammation or simply represent postoperative change. 5. Bilateral L5 pars defects with grade 1 anterolisthesis L5-S1. 6. Mild diffuse subcutaneous body wall edema. Electronically Signed   By: Helyn Numbers M.D.   On: 02/07/2024 22:24    Anti-infectives: Anti-infectives (From admission, onward)    Start     Dose/Rate Route Frequency Ordered Stop   02/07/24 1615  piperacillin-tazobactam (ZOSYN) IVPB 3.375 g        3.375 g 12.5 mL/hr over 240 Minutes Intravenous Every 8 hours 02/07/24 1522         Assessment/Plan: s/p Procedure(s) with comments: EXPLORATORY LAPAROTOMY, LYSIS OF ADHESIONS, SMALL BOWEL RESECTION (N/A) - LYSIS OF ADHESIONS, POSSIBLE SMALL BOWEL  RESECTION CT with intraabdominal fluid collections- IR consult for drainage.'Suspect abscesses causing bladder irritation causing symptoms noted above G tube broken at port- will request IR to replace. Continue G tube to gravity for now.  Damp to dry dressings to midline wound Continue Zosyn.  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

## 2024-02-08 NOTE — Progress Notes (Addendum)
 PHARMACY - TOTAL PARENTERAL NUTRITION CONSULT NOTE   Indication: Prolonged ileus  Patient Measurements: Height: 5\' 8"  (172.7 cm) Weight: 70.3 kg (155 lb) IBW/kg (Calculated) : 68.4 TPN AdjBW (KG): 70.3 Body mass index is 23.57 kg/m. Usual Weight:   Assessment: Harry Williams presented on 3/3 with vomiting and abdominal pain found to have SBO.  PMH significant for total colectomy and J-pouch placement at [facility] because of multiple polyps in his colon, tonsillar cancer, and internal jugular VTE.  He has been NPO since 3/3 and Pharmacy is consulted to dose TPN for ongoing ileus.   Glucose / Insulin: No hx DM, A1c 5.1.   - CBGs/SSI Dc'd on 3/10. Serum glucose < 150 Electrolytes: Elytes WNL, including CorrCa.  K 4.4, Mg 2 - goal K+ >=4 , Goal Mg >=2.  Renal: SCr WNL, BUN WNL Hepatic: WNL Trig 288 (3/10)  Intake / Output: - mIVF: none - Output: drains (Gtube) 400 mL down, UOP -1600 mL  LBM 3/11  GI Imaging: 3/8 KUB: No abnormal bowel dilatation. Possible right renal calculus. 3/11 CT A/P: Multiple loculated fluid collections within the abdomen & pelvis >may represent postoperative seromas, contain bowel leak, or abscesses. underlying partial small bowel obstruction.  GI Surgeries / Procedures:  3/5 Small bowel resection 3/12: G tube exchanged 3/13: Plan IA abscess drainage  Central access: Implanted port TPN start date: 3/8   Nutritional Goals: Goal TPN rate is 90 mL/hr (provides 119 g of protein and 2335 kcals per day)  RD Assessment: pending Estimated Needs Total Energy Estimated Needs: 2300-2500 Total Protein Estimated Needs: 115-130g Total Fluid Estimated Needs: >/=2L  Current Nutrition:  NPO and TPN  Plan:  At 1800:  continue TPN @ goal rate 26mL/hr Electrolytes in TPN: Na 89mEq/L, K 5mEq/L, Ca 62mEq/L, Mg 68mEq/L, and Phos 87mmol/L. Cl:Ac 1:1 Add standard MVI and trace elements to TPN Monitor TPN labs on Mon/Thurs,  Thiamine 100mg  IV daily x 5 days (LD 3/13)   Kelven Flater  S. Merilynn Finland, PharmD, BCPS Clinical Staff Pharmacist 02/08/2024 7:31 AM

## 2024-02-08 NOTE — Procedures (Signed)
 Pt's existing 16 french balloon retention gastrostomy tube was exchanged out for new 16 fr entuit balloon retention G tube without immediate complications. Balloon was filled with 7 cc saline. Tube was flushed and secured to skin surface. EBL none.

## 2024-02-08 NOTE — Progress Notes (Signed)
 Progress Note    Harry Williams   HYQ:[id number]  DOB: [birth year]  DOA: 01/30/2024     8 PCP: [name], MD  Initial CC: abdominal pain  Hospital Course: Harry Williams is a 43 year old male with malignancy of tonsillar fossa status post chemoradiation, oral mucositis due to radiation and with PEG tube for feeding, FAP s/p total colectomy in 2016, rectal bleeding/hemorrhoids, right internal jugular vein thrombus in 10/2023 and stopped taking anticoagulation x 1 month, asthma, AVN of left hip, anxiety/depression/PTSD presented with abdominal pain nausea vomiting no BM x 5 days, in the ED found to have high-grade SBO and admitted for further management.  He underwent ex lap with small bowel resection, repair of small bowel serosa, and lysis of adhesions with general surgery on 02/01/2024.  Interval History:  No events overnight.  Underwent CT yesterday and evaluation with radiology today.  Undergoing G-tube exchange today and abdominal fluid collection drainage tomorrow.  Assessment and Plan:  High Grade Small Bowel Obstruction S/p Total Colectomy for FAP in 2016 S/p PEG Appreciate general surgery's assistance - s/p ex lap with small bowel resection, repair of small bowel serosa x1, lysis of adhesions on 3/5 Back on heparin gtt.  Monitor H/H. Plain films without bowel dilatation GT to gravity per surgery, antiemetics, bowel rest.  TPN per surgery.  PCA for pain.   Post op course complicated by ileus and now developing fevers -> on abx as noted below - CT performed 3/11 showing fluid collection; IR planning to drain on 3/13 - s/p G-tube exchange on 3/12   Fever Blood cultures UA not concerning for UTI CXR without active disease - CT showing fluid collection; continue Zosyn for now; IR planning to drain on 3/13   Acute Kidney Injury-resolved Baseline creatinine < 1.0   Sinus Tachycardia Mild, follow - due to above Will ctm on tele   Dysuria UA not concerning for UTI   Right  internal jugular Thrombus related to [facility]  Hasn't been on anticoagulation prior to admission due to issues with cost Needs anticoagulation rx at discharge, will need continued close follow up outpatient Anticoagulation resumed 3/8 -> transitioned to argatroban due to subtherapeutic heparin levels - Consider repeat CT with contrast to reassess clot burden, oncology had planned for this in March   Anemia Noted, will trend post op   Malignant Neoplasm of Tonsillar Fossa Referred to oncology in Oct 2024 - treated with chemoradiation (received 4 doses of weekly cisplatin, rest held due to neutropenia and patient preference). Following with Dr. [name] from oncology   T wave inversion Leads II, III, aVF and V5, V6 Negative troponin - hold off on echo at this time   Asthma Not in exacerbation   Hyperglycemia Follow A1c (5.1)     Old records reviewed in assessment of this patient  Antimicrobials: Zosyn 02/07/2024 >> current  DVT prophylaxis:  Argatroban   Code Status:   Code Status: Full Code  Mobility Assessment (Last 72 Hours)     Mobility Assessment     Row Name 02/08/24 0805 02/07/24 2000 02/07/24 0725 02/07/24 0615 02/06/24 1915   Does patient have an order for bedrest or is patient medically unstable No - Continue assessment No - Continue assessment No - Continue assessment No - Continue assessment No - Continue assessment   What is the highest level of mobility based on the progressive mobility assessment? Level 5 (Walks with assist in room/hall) - Balance while stepping forward/back and can walk in room with assist -  Complete Level 5 (Walks with assist in room/hall) - Balance while stepping forward/back and can walk in room with assist - Complete Level 5 (Walks with assist in room/hall) - Balance while stepping forward/back and can walk in room with assist - Complete Level 5 (Walks with assist in room/hall) - Balance while stepping forward/back and can walk in room with assist  - Complete Level 5 (Walks with assist in room/hall) - Balance while stepping forward/back and can walk in room with assist - Complete    Row Name 02/06/24 7846 02/06/24 0530 02/05/24 1925       Does patient have an order for bedrest or is patient medically unstable No - Continue assessment No - Continue assessment No - Continue assessment     What is the highest level of mobility based on the progressive mobility assessment? Level 6 (Walks independently in room and hall) - Balance while walking in room without assist - Complete Level 5 (Walks with assist in room/hall) - Balance while stepping forward/back and can walk in room with assist - Complete Level 5 (Walks with assist in room/hall) - Balance while stepping forward/back and can walk in room with assist - Complete              Barriers to discharge: none Disposition Plan:  Home  HH orders placed:  Status is: Inpt  Objective: Blood pressure (!) 141/70, pulse 96, temperature 98.2 F (36.8 C), resp. rate (!) 21, height 5\' 8"  (1.727 m), weight 70.3 kg, SpO2 98%.  Examination:  Physical Exam Constitutional:      General: He is not in acute distress.    Appearance: Normal appearance.  HENT:     Head: Normocephalic and atraumatic.     Mouth/Throat:     Mouth: Mucous membranes are moist.  Eyes:     Extraocular Movements: Extraocular movements intact.  Cardiovascular:     Rate and Rhythm: Normal rate and regular rhythm.  Pulmonary:     Effort: Pulmonary effort is normal. No respiratory distress.     Breath sounds: Normal breath sounds. No wheezing.  Abdominal:     General: Bowel sounds are decreased. There is distension.     Palpations: Abdomen is soft.     Tenderness: There is generalized abdominal tenderness.  Musculoskeletal:        General: Normal range of motion.     Cervical back: Normal range of motion and neck supple.  Skin:    General: Skin is warm and dry.  Neurological:     General: No focal deficit present.      Mental Status: He is alert.  Psychiatric:        Mood and Affect: Mood normal.        Behavior: Behavior normal.      Consultants:  General surgery   Procedures:  02/01/24 PROCEDURE:  Exploratory laparotomy with small bowel resection Repair of small bowel serosa x 1 Lysis of adhesions x 100 minutes  Data Reviewed: Results for orders placed or performed during the hospital encounter of 01/30/24 (from the past 24 hours)  APTT     Status: None   Collection Time: 02/07/24  7:29 PM  Result Value Ref Range   aPTT 33 24 - 36 seconds  APTT     Status: Abnormal   Collection Time: 02/08/24 12:36 AM  Result Value Ref Range   aPTT 53 (H) 24 - 36 seconds  CBC     Status: Abnormal   Collection Time: 02/08/24 12:38  AM  Result Value Ref Range   WBC 15.9 (H) 4.0 - 10.5 K/uL   RBC 3.27 (L) 4.22 - 5.81 MIL/uL   Hemoglobin 10.0 (L) 13.0 - 17.0 g/dL   HCT 16.1 (L) 09.6 - 04.5 %   MCV 101.8 (H) 80.0 - 100.0 fL   MCH 30.6 26.0 - 34.0 pg   MCHC 30.0 30.0 - 36.0 g/dL   RDW 40.9 81.1 - 91.4 %   Platelets 227 150 - 400 K/uL   nRBC 0.0 0.0 - 0.2 %  Basic metabolic panel     Status: Abnormal   Collection Time: 02/08/24 12:38 AM  Result Value Ref Range   Sodium 137 135 - 145 mmol/L   Potassium 3.7 3.5 - 5.1 mmol/L   Chloride 105 98 - 111 mmol/L   CO2 21 (L) 22 - 32 mmol/L   Glucose, Bld 110 (H) 70 - 99 mg/dL   BUN 16 6 - 20 mg/dL   Creatinine, Ser 7.82 0.61 - 1.24 mg/dL   Calcium 8.2 (L) 8.9 - 10.3 mg/dL   GFR, Estimated >95 >62 mL/min   Anion gap 11 5 - 15  APTT     Status: Abnormal   Collection Time: 02/08/24  5:45 AM  Result Value Ref Range   aPTT 71 (H) 24 - 36 seconds    I have reviewed pertinent nursing notes, vitals, labs, and images as necessary. I have ordered labwork to follow up on as indicated.  I have reviewed the last notes from staff over past 24 hours. I have discussed patient's care plan and test results with nursing staff, CM/SW, and other staff as appropriate.  Time  spent: Greater than 50% of the 55 minute visit was spent in counseling/coordination of care for the patient as laid out in the A&P.   LOS: 8 days   Lewie Chamber, MD [facility] 02/08/2024, 3:23 PM

## 2024-02-08 NOTE — Consult Note (Addendum)
 Chief Complaint: Tonsillar cancer/dysphagia, status post Port-A-Cath and gastrostomy tube October 2024, nausea, vomiting ,abdominal pain ,small bowel obstruction status post resection 02/01/2024, post op abdominal fluid collections, broken gastrostomy tube port site; referred for image guided abdominal fluid collection drainage and gastrostomy tube replacement  Referring Provider(s): Connor,C  Supervising Physician: Roanna Banning  Patient Status: [facility] - In-pt  History of Present Illness: Harry Williams is a 43 y.o. male with past medical history of anxiety, asthma, avascular necrosis of left hip, depression, DVT, PTSD, tonsillar cancer with prior chemoradiation, FAP status post total colectomy 2016, right internal jugular thrombus admitted to [facility] on 3/3 with nausea, vomiting, abdominal pain, small bowel obstruction, status post small bowel resection on 02/01/2024; patient also status post Port-A-Cath and gastrostomy tube placements on 09/22/23.  Latest CT abdomen pelvis on 3/11 revealed:   1. Interval partial small bowel resection with anastomotic staple line seen within the mid abdomen. Punctate foci of free intraperitoneal gas are likely postoperative in nature. 2. Multiple loculated fluid collections within the abdomen and pelvis demonstrating some degree of organization and rim enhancement, the largest within the pelvis measuring up to 12.8 cm in greatest dimension (volume = 420 cc). These may represent postoperative seromas, contain bowel leak, or abscesses. 3. Tethering of bowel in the region of the small bowel resection with areas of fat necrosis in this region. Several relatively narrowed loops of bowel, however, suggest an underlying partial small bowel obstruction. 4. Infiltration of the omentum and peritoneal enhancement which may relate to peritoneal inflammation or simply represent postoperative change. 5. Bilateral L5 pars defects with grade 1 anterolisthesis  L5-S1. 6. Mild diffuse subcutaneous body wall edema.  Patient also has broken G-tube at port; currently afebrile, WBC 15.9, hemoglobin 10, platelets normal,creat nl, PT/INR nl; he is on IV Zosyn.  Request now received from surgical team for image guided abdominal fluid collection drainage as well as gastrostomy tube replacement.   Patient is Full Code  Past Medical History:  Diagnosis Date   Anxiety    Asthma    Avascular necrosis of hip, left (HCC) 07/24/2021   Bright red blood per rectum 12/16/2022   Closed fracture of nasal bone with routine healing 12/24/2016   Depression    DVT (deep venous thrombosis) (HCC)    PTSD (post-traumatic stress disorder)     Past Surgical History:  Procedure Laterality Date   APPENDECTOMY     COLON SURGERY  03/30/2015   IR GASTROSTOMY TUBE MOD SED  09/22/2023   IR IMAGING GUIDED PORT INSERTION  09/22/2023   JOINT REPLACEMENT  2008   partial right elbow; right femoral graft   LAPAROTOMY N/A 02/01/2024   Procedure: EXPLORATORY LAPAROTOMY, LYSIS OF ADHESIONS, SMALL BOWEL RESECTION;  Surgeon: Andria Meuse, MD;  Location: [facility] ORS;  Service: General;  Laterality: N/A;  LYSIS OF ADHESIONS, POSSIBLE SMALL BOWEL RESECTION   Total abdominal proctocolectomy, J-pouch, ileal pouch anal anastomosis  2016    Allergies: Patient has no known allergies.  Medications: Prior to Admission medications   Medication Sig Start Date End Date Taking? Authorizing Provider  baclofen (LIORESAL) 10 MG tablet Take 1 tablet (10 mg total) by mouth 3 (three) times daily. Patient taking differently: Take 10 mg by mouth 3 (three) times daily as needed for muscle spasms. 10/04/23  Yes Walisiewicz, Yvonna Alanis E, PA-C  celecoxib (CELEBREX) 200 MG capsule Place 1 capsule (200 mg total) into feeding tube daily as needed for moderate pain (pain score 4-6). 11/29/23  Yes  [name], MD  dicyclomine (BENTYL) 20 MG tablet Take 1 tablet (20 mg total) by mouth 2 (two) times  daily. Patient taking differently: Take 20 mg by mouth 2 (two) times daily as needed for spasms. 01/26/24  Yes Palumbo, April, MD  HYDROcodone-acetaminophen (HYCET) 7.5-325 mg/15 ml solution Take 10 mLs by mouth every 4 (four) hours as needed for moderate pain (pain score 4-6). 12/13/23  Yes Pasam, [name], MD  lidocaine (XYLOCAINE) 2 % solution Patient: Mix 1part 2% viscous lidocaine, 1part H20. Swish & swallow 10mL of diluted mixture, before meals and at bedtime, up to QID Patient taking differently: Use as directed 15 mLs in the mouth or throat every 6 (six) hours as needed for mouth pain. Mix 1 part 2% viscous lidocaine with 1 part water. Swish & swallow 10 ml's of diluted mixture up to 4 times a day PRN 10/10/23  Yes [name], MD  Nutritional Supplements (FEEDING SUPPLEMENT, OSMOLITE 1.5 CAL,) LIQD 7 cartons Osmolite 1.5 split over four feeding daily. Flush tube with 60 ml water before and after each bolus. Provide additional 3 1/2 cups (830 ml) water/day to meet hydration needs. Provides 2485 kcal, 104.3 g protein, 1267 ml free water from formula (2577 ml total water with flushes). 1659 ml/day meets 100% RDI 10/18/23  Yes [name], MD  omeprazole (PRILOSEC) 20 MG capsule Take 20 mg by mouth See admin instructions. 20 mg per tube once a day PRN   Yes [provider]  ondansetron (ZOFRAN-ODT) 8 MG disintegrating tablet 8mg  ODT q8hours prn nausea Patient taking differently: Take 8 mg by mouth every 8 (eight) hours as needed for nausea or vomiting. 8mg  ODT q8hours prn nausea 01/26/24  Yes Palumbo, April, MD  apixaban (ELIQUIS) 5 MG TABS tablet Take 1 tablet (5 mg total) by mouth 2 (two) times daily. Patient not taking: Reported on 01/31/2024 12/15/23   Pasam, [name], MD  PARoxetine (PAXIL CR) 12.5 MG 24 hr tablet Take 1 tablet (12.5 mg total) by mouth daily. Patient not taking: Reported on 11/01/2023 08/30/23   Thresa Ross, MD  rivaroxaban (XARELTO) 20 MG TABS tablet Take 1 tablet  (20 mg total) by mouth daily with supper. Patient not taking: Reported on 01/31/2024 01/02/24   Pasam, [name], MD  scopolamine (TRANSDERM-SCOP) 1 MG/3DAYS Place 1 patch (1.5 mg total) onto the skin every 3 (three) days. Patient not taking: Reported on 01/31/2024 10/31/23   [name], MD     Family History  Problem Relation Age of Onset   Arthritis Mother    Anemia Mother    Diabetes Father    Hypertension Brother    Asthma Daughter    Heart disease Paternal Grandmother    Cancer Paternal Grandfather     Social History   Socioeconomic History   Marital status: Married    Spouse name: Teacher, English as a foreign language   Number of children: 3   Years of education: 12+   Highest education level: Some college, no degree  Occupational History   Not on file  Tobacco Use   Smoking status: Never   Smokeless tobacco: Never  Vaping Use   Vaping status: Never Used  Substance and Sexual Activity   Alcohol use: Yes    Comment: Occasional   Drug use: Never   Sexual activity: Yes    Partners: Female  Other Topics Concern   Not on file  Social History Narrative   ** Merged History Encounter **       Social Drivers of  Health   Financial Resource Strain: Not on file  Food Insecurity: No Food Insecurity (02/01/2024)   Hunger Vital Sign    Worried About Running Out of Food in the Last Year: Never true    Ran Out of Food in the Last Year: Never true  Transportation Needs: No Transportation Needs (02/01/2024)   PRAPARE - Administrator, Civil Service (Medical): No    Lack of Transportation (Non-Medical): No  Physical Activity: Not on file  Stress: Not on file  Social Connections: Not on file       Review of Systems denies fever,HA,CP,dtyspnea, back pain,N/V or bleeding; does have occ cough, abd pain  Vital Signs: BP 133/78 (BP Location: Right Arm)   Pulse 90   Temp 98.8 F (37.1 C)   Resp 15   Ht 5\' 8"  (1.727 m)   Wt 155 lb (70.3 kg)   SpO2 96%   BMI 23.57 kg/m   Advance Care  Plan: No documents on file  Physical Exam awake/alert; chest- sl dim BS bases; clean, intact rt chest port a cath; heart- RRR; abd- soft, sl distended, some gen tenderness to palpation, midline wound bandaged, G tube in place and to gravity drain, broken hub region; no LE edema  Imaging: CT ABDOMEN PELVIS W CONTRAST Result Date: 02/07/2024 CLINICAL DATA:  Postoperative abdominal pain EXAM: CT ABDOMEN AND PELVIS WITH CONTRAST TECHNIQUE: Multidetector CT imaging of the abdomen and pelvis was performed using the standard protocol following bolus administration of intravenous contrast. RADIATION DOSE REDUCTION: This exam was performed according to the departmental dose-optimization program which includes automated exposure control, adjustment of the mA and/or kV according to patient size and/or use of iterative reconstruction technique. CONTRAST:  OMNIPAQUE IOHEXOL 300 MG/ML SOLN, 30mL OMNIPAQUE IOHEXOL 300 MG/ML SOLN COMPARISON:  01/30/2024 FINDINGS: Lower chest: Bibasilar atelectasis. Central venous catheter tip noted within the superior right atrium. No acute abnormality. Hepatobiliary: No focal liver abnormality is seen. No gallstones, gallbladder wall thickening, or biliary dilatation. Pancreas: Unremarkable Spleen: Unremarkable Adrenals/Urinary Tract: Adrenal glands are unremarkable. Kidneys are normal, without renal calculi, focal lesion, or hydronephrosis. Bladder is unremarkable. Stomach/Bowel: Surgical changes of probable subtotal colectomy are again identified. There has been interval partial small bowel resection with anastomotic staple line seen within the mid abdomen. Punctate foci of free intraperitoneal gas or likely postoperative in nature. There is infiltration of the omentum which is nonspecific in the immediate postoperative setting. Similarly, peritoneal enhancement may relate to peritoneal inflammation or simply represent postoperative change. There are multiple loculated fluid  collections within the abdomen demonstrating some degree of organization and rim enhancement: Right upper quadrant, axial image # 35/2, 5.7 x 3.2 cm. Left mid abdominal mesenteric folds, axial image # 50/2, 3.0 x 5.0 cm Right lower quadrant, axial image # 62/2, 1.5 x 4.6 cm Pelvis, axial image # 71/2, 9.4 x 12.8 x 6.6 cm (volume = 420 cm^3) There is tethering of bowel in the region of the small bowel resection, best appreciated on coronal image # 30/6 with areas of fat necrosis in this region, best appreciated on image # 31/6. Several relatively narrowed loops of bowel, however, suggest an underlying partial small bowel obstruction. Vascular/Lymphatic: No significant vascular findings are present. No enlarged abdominal or pelvic lymph nodes. Reproductive: Prostate is unremarkable. Other: Mild diffuse subcutaneous body wall edema. Musculoskeletal: Bilateral L5 pars defects are present with grade 1 anterolisthesis L5-S1. No acute bone abnormality. IMPRESSION: 1. Interval partial small bowel resection with anastomotic staple line  seen within the mid abdomen. Punctate foci of free intraperitoneal gas are likely postoperative in nature. 2. Multiple loculated fluid collections within the abdomen and pelvis demonstrating some degree of organization and rim enhancement, the largest within the pelvis measuring up to 12.8 cm in greatest dimension (volume = 420 cc). These may represent postoperative seromas, contain bowel leak, or abscesses. 3. Tethering of bowel in the region of the small bowel resection with areas of fat necrosis in this region. Several relatively narrowed loops of bowel, however, suggest an underlying partial small bowel obstruction. 4. Infiltration of the omentum and peritoneal enhancement which may relate to peritoneal inflammation or simply represent postoperative change. 5. Bilateral L5 pars defects with grade 1 anterolisthesis L5-S1. 6. Mild diffuse subcutaneous body wall edema. Electronically Signed    By: Helyn Numbers M.D.   On: 02/07/2024 22:24   DG CHEST PORT 1 VIEW Result Date: 02/06/2024 CLINICAL DATA:  Fevers EXAM: PORTABLE CHEST 1 VIEW COMPARISON:  11/01/2023 FINDINGS: Cardiac shadow is within normal limits. Lungs are well aerated bilaterally. Right chest wall port is again seen. No acute bony abnormality is noted. IMPRESSION: No active disease. Electronically Signed   By: Alcide Clever M.D.   On: 02/06/2024 10:20   DG Abd 1 View Result Date: 02/04/2024 CLINICAL DATA:  Abdominal pain.  Constipation. EXAM: ABDOMEN - 1 VIEW COMPARISON:  February 01, 2024.  January 30, 2024. FINDINGS: The bowel gas pattern is normal. Probable right renal calculus is noted. Phleboliths are seen in right pelvis. IMPRESSION: No abnormal bowel dilatation.  Possible right renal calculus. Electronically Signed   By: Lupita Raider M.D.   On: 02/04/2024 14:18   [name] EKG SITE RITE Result Date: 02/04/2024 If Site Rite image not attached, placement could not be confirmed due to current cardiac rhythm.  DG Abd Portable 1V Result Date: 02/01/2024 CLINICAL DATA:  Small bowel obstruction. EXAM: PORTABLE ABDOMEN - 1 VIEW COMPARISON:  January 31, 2024.  January 30, 2024. FINDINGS: Continued small bowel dilatation is noted concerning for distal small bowel obstruction. Phleboliths are noted in the pelvis. IMPRESSION: Continued small bowel dilatation concerning for distal small bowel obstruction. Electronically Signed   By: Lupita Raider M.D.   On: 02/01/2024 11:30   DG Abd Portable 1V-Small Bowel Obstruction Protocol-initial, 8 hr delay Result Date: 01/31/2024 CLINICAL DATA:  Bowel obstruction 8 hour delay EXAM: PORTABLE ABDOMEN - 1 VIEW COMPARISON:  CT 01/30/2024 FINDINGS: Contrast within the urinary bladder. Severe small bowel distension up to 6.5 cm consistent with high-grade bowel obstruction, configuration similar to scout image from CT on 01/30/2024. Question dilute contrast within dilated left abdominal small bowel but definitely no  colon contrast is visible IMPRESSION: Persistent dilatation of small bowel consistent with high-grade bowel obstruction. Question dilute contrast within dilated left abdominal small bowel but no colon contrast Electronically Signed   By: Jasmine Pang M.D.   On: 01/31/2024 23:04   CT ABDOMEN PELVIS W CONTRAST Result Date: 01/30/2024 CLINICAL DATA:  Abdominal pain.  Bowel obstruction suspected. EXAM: CT ABDOMEN AND PELVIS WITH CONTRAST TECHNIQUE: Multidetector CT imaging of the abdomen and pelvis was performed using the standard protocol following bolus administration of intravenous contrast. RADIATION DOSE REDUCTION: This exam was performed according to the departmental dose-optimization program which includes automated exposure control, adjustment of the mA and/or kV according to patient size and/or use of iterative reconstruction technique. CONTRAST:  OMNIPAQUE IOHEXOL 300 MG/ML  SOLN COMPARISON:  CT 01/26/2024 FINDINGS: Lower chest: No acute abnormality.  Hepatobiliary: No acute abnormality. Pancreas: Unremarkable. Spleen: Unremarkable. Adrenals/Urinary Tract: Within normal limits. Stomach/Bowel: Percutaneous gastrostomy tube in unchanged position. Postoperative changes of colectomy with ileoanal anastomosis. Diffuse dilation of the small bowel. There is an abrupt transition point in the right central abdomen (series 8/image [id number]). There is an adjacent transition point downstream from the original transition point with dilation of the small bowel between the transition points (series 8/image 56). Findings are compatible with closed loop obstruction. No signs of ischemic bowel. Vascular/Lymphatic: No significant vascular findings are present. No enlarged abdominal or pelvic lymph nodes. Reproductive: Unremarkable. Other: Small volume free fluid in the pelvis. No free intraperitoneal air. Musculoskeletal: No acute fracture. 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 IMPRESSION: High-grade  small bowel closed loop obstruction in the right hemiabdomen. No evidence of ischemia. Critical Value/emergent results were called by telephone at the time of interpretation on 01/30/2024 at 11:10 pm to provider Riki Sheer , who verbally acknowledged these results. Electronically Signed   By: Minerva Fester M.D.   On: 01/30/2024 23:11   CT ABDOMEN PELVIS W CONTRAST Result Date: 01/26/2024 CLINICAL DATA:  Abdominal pain EXAM: CT ABDOMEN AND PELVIS WITH CONTRAST TECHNIQUE: Multidetector CT imaging of the abdomen and pelvis was performed using the standard protocol following bolus administration of intravenous contrast. RADIATION DOSE REDUCTION: This exam was performed according to the departmental dose-optimization program which includes automated exposure control, adjustment of the mA and/or kV according to patient size and/or use of iterative reconstruction technique. CONTRAST:  OMNIPAQUE IOHEXOL 300 MG/ML  SOLN COMPARISON:  09/07/2023 FINDINGS: Lower chest: No acute abnormality. Hepatobiliary: No focal liver abnormality is seen. No gallstones, gallbladder wall thickening, or biliary dilatation. Pancreas: Unremarkable. No pancreatic ductal dilatation or surrounding inflammatory changes. Spleen: Normal in size without focal abnormality. Adrenals/Urinary Tract: Adrenal glands are within normal limits. Kidneys demonstrate a normal enhancement pattern bilaterally. No calculi or obstructive changes are seen. The bladder is decompressed. Stomach/Bowel: Gastrostomy catheter is noted in place. No inflammatory changes surrounding the catheter are seen. 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 Ileo anal anastomosis is seen. Some mildly prominent loops of small bowel are noted in the mid abdomen. These are noted just proximal to the ileoanal anastomosis and relatively similar to that seen on the prior exam. This likely represents some chronic dilatation proximal to the anastomosis. Vascular/Lymphatic: No  significant vascular findings are present. No enlarged abdominal or pelvic lymph nodes. Reproductive: Prostate is unremarkable. Other: No abdominal wall hernia or abnormality. No abdominopelvic ascites. 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 IMPRESSION: Changes consistent with prior colectomy with ileoanal anastomosis. Some dilated loops are noted proximal to this but chronic in nature. No other focal abnormality is noted. Electronically Signed   By: Alcide Clever M.D.   On: 01/26/2024 01:20    Labs:  CBC: Recent Labs    02/05/24 0112 02/06/24 0457 02/07/24 0020 02/08/24 0038  WBC 8.4 11.3* 13.8* 15.9*  HGB 10.3* 10.2* 9.4* 10.0*  HCT 32.4* 32.3* 29.8* 33.3*  PLT 270 238 218 227    COAGS: Recent Labs    09/22/23 0826 01/31/24 0455 02/07/24 1929 02/08/24 0036 02/08/24 0545  INR 0.9 1.1  --   --   --   APTT  --  25 33 53* 71*    BMP: Recent Labs    02/05/24 0112 02/06/24 0457 02/07/24 0020 02/08/24 0038  NA 136 135 136 137  K  4.0 3.6 3.7 3.7  CL 99 101 104 105  CO2 29 26 23  21*  GLUCOSE 129* 141* 142* 110*  BUN 14 16 18 16   CALCIUM 8.1* 8.1* 8.1* 8.2*  CREATININE 1.03 0.95 0.68 0.71  GFRNONAA >60 >60 >60 >60    LIVER FUNCTION TESTS: Recent Labs    02/03/24 0512 02/04/24 0728 02/05/24 0112 02/06/24 0457  BILITOT 0.7 0.9 0.8 0.4  AST 14* 12* 9* 10*  ALT 10 8 8 8   ALKPHOS 51 53 52 59  PROT 5.3* 5.4* 5.5* 5.5*  ALBUMIN 2.6* 2.4* 2.3* 2.2*    TUMOR MARKERS: No results for input(s): "AFPTM", "CEA", "CA199", "CHROMGRNA" in the last 8760 hours.  Assessment and Plan: 43 y.o. male with past medical history of anxiety, asthma, avascular necrosis of left hip, depression, DVT, PTSD, tonsillar cancer with prior chemoradiation, FAP status post total colectomy 2016, right internal jugular thrombus admitted to [facility] on 3/3 with nausea, vomiting, abdominal pain,  small bowel obstruction, status post small bowel resection on 02/01/2024; patient also status post Port-A-Cath and gastrostomy tube placements on 09/22/23.  Latest CT abdomen pelvis on 3/11 revealed:   1. Interval partial small bowel resection with anastomotic staple line seen within the mid abdomen. Punctate foci of free intraperitoneal gas are likely postoperative in nature. 2. Multiple loculated fluid collections within the abdomen and pelvis demonstrating some degree of organization and rim enhancement, the largest within the pelvis measuring up to 12.8 cm in greatest dimension (volume = 420 cc). These may represent postoperative seromas, contain bowel leak, or abscesses. 3. Tethering of bowel in the region of the small bowel resection with areas of fat necrosis in this region. Several relatively narrowed loops of bowel, however, suggest an underlying partial small bowel obstruction. 4. Infiltration of the omentum and peritoneal enhancement which may relate to peritoneal inflammation or simply represent postoperative change. 5. Bilateral L5 pars defects with grade 1 anterolisthesis L5-S1. 6. Mild diffuse subcutaneous body wall edema.  Patient also has broken G-tube at port; currently afebrile, WBC 15.9, hemoglobin 10, platelets normal,creat nl, PT/INR  pend; he is on IV Zosyn.  Request now received from surgical team for image guided abdominal fluid collection drainage as well as gastrostomy tube replacement.Risks and benefits discussed with the patient/spouse including bleeding, infection, damage to adjacent structures, bowel perforation/fistula connection, and sepsis.  All of the patient's questions were answered, patient is agreeable to proceed. Consent signed and in chart.  Due to heavy IR schedule today will plan to do abd fluid collection drainage on 3/13 and do bedside G tube exchange today  Thank you for allowing our service to participate in Harry Williams 's  care.  Electronically Signed: D. Jeananne Rama, PA-C   02/08/2024, 11:51 AM      I spent a total of  25 minutes   in face to face in clinical consultation, greater than 50% of which was counseling/coordinating care for image guided abdominal fluid collection drainage and gastrostomy tube replacement

## 2024-02-08 NOTE — Progress Notes (Signed)
 PHARMACY - ANTICOAGULATION CONSULT NOTE  Pharmacy Consult for heparin to argatroban Indication: hx right internal jugular (IJ) vein thromboembolism  No Known Allergies  Patient Measurements: Height: 5\' 8"  (172.7 cm) Weight: 70.3 kg (155 lb) IBW/kg (Calculated) : 68.4 Heparin Dosing Weight: 70 kg  Vital Signs: Temp: 97.5 F (36.4 C) (03/11 1932) BP: 129/73 (03/11 1932) Pulse Rate: 90 (03/11 1932)  Labs: Recent Labs    02/06/24 0457 02/06/24 0842 02/07/24 0020 02/07/24 0525 02/07/24 1235 02/07/24 1929 02/08/24 0036 02/08/24 0038  HGB 10.2*  --  9.4*  --   --   --   --  10.0*  HCT 32.3*  --  29.8*  --   --   --   --  33.3*  PLT 238  --  218  --   --   --   --  227  APTT  --   --   --   --   --  33 53*  --   HEPARINUNFRC  --    < > 0.13* 0.13* 0.15*  --   --   --   CREATININE 0.95  --  0.68  --   --   --   --  0.71   < > = values in this interval not displayed.    Estimated Creatinine Clearance: 117.6 mL/min (by C-G formula based on SCr of 0.71 mg/dL).   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 He presented to the ED on 01/30/24 with c/o n/v and abdominal pain. He was subsequently found to have SBO and underwent exp lap with small bowel resection, repair of small bowel serosa and lysis of adhesions on 02/01/24. Heparin drip started on  admission for internal jugular thrombus and d/ced on 02/01/24 for surgery. Pharmacy has been consulted on 02/04/24 to resume heparin drip back with no bolus. Transition heparin to argatroban on 02/07/2024 given ongoing subtherapeutic heparin levels.   Today, 02/08/2024: aPTT 53 which is therapeutic on argatroban 1 mcg/kg/min (4.22 ml/hr) CBC: Hg 10.0- low/stable, pltc WNL No bleeding or infusion related concerns reported by RN  Goal of Therapy:  aPTT 50-90 sec Monitor platelets by anticoagulation protocol: Yes   Plan:  - Continue infusion rate of argatroban IV at 1 mcg/kg/min - Repeat aPTT in 4 hours  - Monitor daily CBC & aPTT along with sign/symptoms of bleeding    Thank you for allowing pharmacy to be a part of this patient's care.  Junita Push, PharmD, BCPS 02/08/2024 1:28 AM

## 2024-02-08 NOTE — Progress Notes (Signed)
 PHARMACY - ANTICOAGULATION CONSULT NOTE  Pharmacy Consult for argatroban Indication: hx right internal jugular (IJ) vein thromboembolism  No Known Allergies  Patient Measurements: Height: 5\' 8"  (172.7 cm) Weight: 70.3 kg (155 lb) IBW/kg (Calculated) : 68.4 Heparin Dosing Weight: 70 kg  Vital Signs: Temp: 98.8 F (37.1 C) (03/12 0452) BP: 133/78 (03/12 0452)  Labs: Recent Labs    02/06/24 0457 02/06/24 0842 02/07/24 0020 02/07/24 0525 02/07/24 1235 02/07/24 1929 02/08/24 0036 02/08/24 0038 02/08/24 0545  HGB 10.2*  --  9.4*  --   --   --   --  10.0*  --   HCT 32.3*  --  29.8*  --   --   --   --  33.3*  --   PLT 238  --  218  --   --   --   --  227  --   APTT  --   --   --   --   --  33 53*  --  71*  HEPARINUNFRC  --    < > 0.13* 0.13* 0.15*  --   --   --   --   CREATININE 0.95  --  0.68  --   --   --   --  0.71  --    < > = values in this interval not displayed.    Estimated Creatinine Clearance: 117.6 mL/min (by C-G formula based on SCr of 0.71 mg/dL).   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 He presented to the ED on 01/30/24 with c/o n/v and abdominal pain. He was subsequently found to have SBO and underwent exp lap with small bowel resection, repair of small bowel serosa and lysis of adhesions on 02/01/24. Heparin drip started on  admission for internal jugular thrombus and d/ced on 02/01/24 for surgery. Pharmacy has been consulted on 02/04/24 to resume heparin drip back with no bolus. Transition heparin to argatroban on 02/07/2024 given ongoing subtherapeutic heparin levels.   Today, 02/08/2024: aPTT 71 - therapeutic on argatroban 1 mcg/kg/min (4.22 mL/hr) CBC: Hg 10.0- low/stable, pltc WNL No bleeding or infusion related concerns reported   Goal of Therapy:  aPTT 50-90 sec Monitor platelets by anticoagulation protocol: Yes   Plan:  - Continue infusion rate of argatroban IV at 1 mcg/kg/min - Monitor daily CBC & aPTT along with sign/symptoms of bleeding    Harry Williams, Student-PharmD 02/08/2024 8:09 AM

## 2024-02-09 ENCOUNTER — Inpatient Hospital Stay (HOSPITAL_COMMUNITY)

## 2024-02-09 ENCOUNTER — Telehealth: Payer: Self-pay | Admitting: *Deleted

## 2024-02-09 DIAGNOSIS — N179 Acute kidney failure, unspecified: Secondary | ICD-10-CM | POA: Diagnosis not present

## 2024-02-09 DIAGNOSIS — K56609 Unspecified intestinal obstruction, unspecified as to partial versus complete obstruction: Secondary | ICD-10-CM | POA: Diagnosis not present

## 2024-02-09 LAB — PHOSPHORUS: Phosphorus: 3.7 mg/dL (ref 2.5–4.6)

## 2024-02-09 LAB — CBC
HCT: 29.8 % — ABNORMAL LOW (ref 39.0–52.0)
Hemoglobin: 9.3 g/dL — ABNORMAL LOW (ref 13.0–17.0)
MCH: 30.4 pg (ref 26.0–34.0)
MCHC: 31.2 g/dL (ref 30.0–36.0)
MCV: 97.4 fL (ref 80.0–100.0)
Platelets: 248 10*3/uL (ref 150–400)
RBC: 3.06 MIL/uL — ABNORMAL LOW (ref 4.22–5.81)
RDW: 14.2 % (ref 11.5–15.5)
WBC: 12.2 10*3/uL — ABNORMAL HIGH (ref 4.0–10.5)
nRBC: 0 % (ref 0.0–0.2)

## 2024-02-09 LAB — COMPREHENSIVE METABOLIC PANEL
ALT: 20 U/L (ref 0–44)
AST: 23 U/L (ref 15–41)
Albumin: 2.2 g/dL — ABNORMAL LOW (ref 3.5–5.0)
Alkaline Phosphatase: 109 U/L (ref 38–126)
Anion gap: 11 (ref 5–15)
BUN: 16 mg/dL (ref 6–20)
CO2: 21 mmol/L — ABNORMAL LOW (ref 22–32)
Calcium: 8.4 mg/dL — ABNORMAL LOW (ref 8.9–10.3)
Chloride: 104 mmol/L (ref 98–111)
Creatinine, Ser: 0.81 mg/dL (ref 0.61–1.24)
GFR, Estimated: >60 mL/min (ref >60)
Glucose, Bld: 129 mg/dL — ABNORMAL HIGH (ref 70–99)
Potassium: 4.4 mmol/L (ref 3.5–5.1)
Sodium: 136 mmol/L (ref 135–145)
Total Bilirubin: 0.7 mg/dL (ref 0.0–1.2)
Total Protein: 5.8 g/dL — ABNORMAL LOW (ref 6.5–8.1)

## 2024-02-09 LAB — APTT: aPTT: 61 s — ABNORMAL HIGH (ref 24–36)

## 2024-02-09 LAB — MAGNESIUM: Magnesium: 2 mg/dL (ref 1.7–2.4)

## 2024-02-09 MED ORDER — NALOXONE HCL 0.4 MG/ML IJ SOLN
INTRAMUSCULAR | Status: AC
  Filled 2024-02-09: qty 1

## 2024-02-09 MED ORDER — FLUMAZENIL 0.5 MG/5ML IV SOLN
INTRAVENOUS | Status: AC
  Filled 2024-02-09: qty 5

## 2024-02-09 MED ORDER — FENTANYL CITRATE (PF) 100 MCG/2ML IJ SOLN
INTRAMUSCULAR | Status: AC | PRN
  Administered 2024-02-09: 50 ug via INTRAVENOUS

## 2024-02-09 MED ORDER — FENTANYL CITRATE (PF) 100 MCG/2ML IJ SOLN
INTRAMUSCULAR | Status: AC
  Filled 2024-02-09: qty 4

## 2024-02-09 MED ORDER — FENTANYL CITRATE (PF) 100 MCG/2ML IJ SOLN
INTRAMUSCULAR | Status: AC | PRN
Start: 2024-02-09 — End: 2024-02-09
  Administered 2024-02-09: 50 ug via INTRAVENOUS

## 2024-02-09 MED ORDER — MIDAZOLAM HCL 2 MG/2ML IJ SOLN
INTRAMUSCULAR | Status: AC
  Filled 2024-02-09: qty 4

## 2024-02-09 MED ORDER — MIDAZOLAM HCL 2 MG/2ML IJ SOLN
INTRAMUSCULAR | Status: AC | PRN
  Administered 2024-02-09: 1 mg via INTRAVENOUS

## 2024-02-09 MED ORDER — ACETAMINOPHEN 500 MG PO TABS
1000.0000 mg | ORAL_TABLET | Freq: Four times a day (QID) | ORAL | Status: DC
  Administered 2024-02-09 – 2024-02-11 (×7): 1000 mg via ORAL
  Filled 2024-02-09 (×8): qty 2

## 2024-02-09 MED ORDER — LIDOCAINE HCL 1 % IJ SOLN
INTRAMUSCULAR | Status: AC | PRN
  Administered 2024-02-09: 10 mL via INTRADERMAL

## 2024-02-09 MED ORDER — DEXTROSE 70 % IV SOLN
INTRAVENOUS | Status: AC
  Filled 2024-02-09: qty 1188

## 2024-02-09 NOTE — Progress Notes (Signed)
 PHARMACY - ANTICOAGULATION CONSULT NOTE  Pharmacy Consult for argatroban Indication: hx right internal jugular (IJ) vein thromboembolism  No Known Allergies  Patient Measurements: Height: 5\' 8"  (172.7 cm) Weight: 70.1 kg (154 lb 8.7 oz) IBW/kg (Calculated) : 68.4 Heparin Dosing Weight: 70 kg  Vital Signs: Temp: 100.9 F (38.3 C) (03/13 0625) BP: 130/81 (03/13 0525) Pulse Rate: 106 (03/13 0525)  Labs: Recent Labs    02/07/24 0020 02/07/24 0525 02/07/24 1235 02/07/24 1929 02/08/24 0036 02/08/24 0038 02/08/24 0545 02/09/24 0607  HGB 9.4*  --   --   --   --  10.0*  --  9.3*  HCT 29.8*  --   --   --   --  33.3*  --  29.8*  PLT 218  --   --   --   --  227  --  248  APTT  --   --   --    < > 53*  --  71* 61*  HEPARINUNFRC 0.13* 0.13* 0.15*  --   --   --   --   --   CREATININE 0.68  --   --   --   --  0.71  --  0.81   < > = values in this interval not displayed.    Estimated Creatinine Clearance: 116.1 mL/min (by C-G formula based on SCr of 0.81 mg/dL).   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 He presented to the ED on 01/30/24 with c/o n/v and abdominal pain. He was subsequently found to have SBO and underwent exp lap with small bowel resection, repair of small bowel serosa and lysis of adhesions on 02/01/24. Heparin drip started on  admission for internal jugular thrombus and d/ced on 02/01/24 for surgery. Pharmacy has been consulted on 02/04/24 to resume heparin drip back with no bolus. Transition heparin to argatroban on 02/07/2024 given ongoing subtherapeutic heparin levels.   Today, 02/09/2024: aPTT 61 - therapeutic on argatroban 1 mcg/kg/min (4.22 mL/hr) CBC: Hg 9.3- low/stable, pltc WNL Per pt's RN, no issues with line and no bleeding noted.   Goal of Therapy:  aPTT 50-90 sec Monitor platelets by anticoagulation protocol: Yes   Plan:  - Continue infusion rate of argatroban IV at 1 mcg/kg/min - Monitor daily CBC & aPTT along with sign/symptoms of bleeding    [name], Student-PharmD 02/09/2024 8:16 AM

## 2024-02-09 NOTE — Plan of Care (Signed)
  Problem: Education: Goal: Knowledge of General Education information will improve Description: Including pain rating scale, medication(s)/side effects and non-pharmacologic comfort measures Outcome: Progressing   Problem: Health Behavior/Discharge Planning: Goal: Ability to manage health-related needs will improve Outcome: Progressing   Problem: Clinical Measurements: Goal: Diagnostic test results will improve Outcome: Progressing Goal: Respiratory complications will improve Outcome: Progressing   Problem: Activity: Goal: Risk for activity intolerance will decrease Outcome: Progressing   Problem: Pain Managment: Goal: General experience of comfort will improve and/or be controlled Outcome: Progressing

## 2024-02-09 NOTE — Progress Notes (Addendum)
 8 Days Post-Op   Subjective/Chief Complaint: Having bowel movements.  Appears g-tube has been clamped since replacement yesterday.  No nausea.  Going down now for IR drain placement.  Has been sitting in a chair, but no ambulation due to all the things he is hooked up to.   Objective: Vital signs in last 24 hours: Temp:  [98.2 F (36.8 C)-100.9 F (38.3 C)] 99.7 F (37.6 C) (03/13 0900) Pulse Rate:  [88-106] 106 (03/13 0525) Resp:  [15-21] 18 (03/13 0827) BP: (125-141)/(70-81) 130/81 (03/13 0525) SpO2:  [98 %-100 %] 99 % (03/13 0525) FiO2 (%):  [15 %-25 %] 15 % (03/13 0400) Weight:  [70.1 kg] 70.1 kg (03/13 0500) Last BM Date : 02/07/24  Intake/Output from previous day: 03/12 0701 - 03/13 0700 In: 1128 [I.V.:1128] Out: 2000 [Urine:1600; Drains:400] Intake/Output this shift: Total I/O In: -  Out: 400 [Urine:400]  General appearance: alert and cooperative Resp: respiratory effort normal on RA Cardio: regular rate and rhythm GI: soft, less tender today, distention improved, per patient. Midline wound is clean and packed.  G tube new and clamped  Lab Results:  Recent Labs    02/08/24 0038 02/09/24 0607  WBC 15.9* 12.2*  HGB 10.0* 9.3*  HCT 33.3* 29.8*  PLT 227 248   BMET Recent Labs    02/08/24 0038 02/09/24 0607  NA 137 136  K 3.7 4.4  CL 105 104  CO2 21* 21*  GLUCOSE 110* 129*  BUN 16 16  CREATININE 0.71 0.81  CALCIUM 8.2* 8.4*   PT/INR No results for input(s): "LABPROT", "INR" in the last 72 hours. ABG No results for input(s): "PHART", "HCO3" in the last 72 hours.  Invalid input(s): "PCO2", "PO2"  Studies/Results: DG ABDOMEN PEG TUBE LOCATION Result Date: 02/08/2024 CLINICAL DATA:  Gastrostomy status. EXAM: ABDOMEN - 1 VIEW COMPARISON:  Abdominal CT yesterday FINDINGS: Supine view of the abdomen obtained after the installation of enteric contrast through indwelling gastrostomy tube contrast is within the stomach. There is no evidence of extravasation  or leak. Few air-filled prominent loops of small bowel centrally. IMPRESSION: Gastrostomy tube within the stomach. No extravasation or leak. Electronically Signed   By: Narda Rutherford M.D.   On: 02/08/2024 22:23   IR Replc Gastro/Colonic Tube Percut W/Fluoro Result Date: 02/08/2024 INDICATION: Patient with history of tonsillar cancer, dysphagia, prior 28 French balloon retention gastrostomy tube placement in October of last year; now with broken hub/leaking. Request received for gastrostomy tube exchange. EXAM: GASTROSTOMY TUBE EXCHANGE MEDICATIONS: None ANESTHESIA/SEDATION: None CONTRAST:  Abdominal film with contrast pending FLUOROSCOPY: Abdominal film pending COMPLICATIONS: None immediate. PROCEDURE: Informed consent was obtained from the patient/spouse after a thorough discussion of the procedural risks, benefits and alternatives. All questions were addressed. A timeout was performed prior to the initiation of the procedure. Bandage and bag drain was removed from gastrostomy tube; 6 cc saline was removed from retention balloon and using manual traction the tube was removed in its entirety without immediate complications. A new 16 french entuit balloon retention gastrostomy tube was then inserted, balloon filled with 7 cc saline. Tube was flushed, secured to skin and split gauze dressing applied to site. IMPRESSION: Successful exchange for a new 16 Fr balloon-retention gastrostomy tube. Performed by: Artemio Aly RECOMMENDATIONS: The patient will return to [facility] ([facility]) for routine feeding tube evaluation and exchange in 6 months. Roanna Banning, MD [facility] Electronically Signed   By: Roanna Banning M.D.   On: 02/08/2024  16:42   CT ABDOMEN PELVIS W CONTRAST Result Date: 02/07/2024 CLINICAL DATA:  Postoperative abdominal pain EXAM: CT ABDOMEN AND PELVIS WITH CONTRAST TECHNIQUE: Multidetector CT imaging of the abdomen  and pelvis was performed using the standard protocol following bolus administration of intravenous contrast. RADIATION DOSE REDUCTION: This exam was performed according to the departmental dose-optimization program which includes automated exposure control, adjustment of the mA and/or kV according to patient size and/or use of iterative reconstruction technique. CONTRAST:  OMNIPAQUE IOHEXOL 300 MG/ML SOLN, 30mL OMNIPAQUE IOHEXOL 300 MG/ML SOLN COMPARISON:  01/30/2024 FINDINGS: Lower chest: Bibasilar atelectasis. Central venous catheter tip noted within the superior right atrium. No acute abnormality. Hepatobiliary: No focal liver abnormality is seen. No gallstones, gallbladder wall thickening, or biliary dilatation. Pancreas: Unremarkable Spleen: Unremarkable Adrenals/Urinary Tract: Adrenal glands are unremarkable. Kidneys are normal, without renal calculi, focal lesion, or hydronephrosis. Bladder is unremarkable. Stomach/Bowel: Surgical changes of probable subtotal colectomy are again identified. There has been interval partial small bowel resection with anastomotic staple line seen within the mid abdomen. Punctate foci of free intraperitoneal gas or likely postoperative in nature. There is infiltration of the omentum which is nonspecific in the immediate postoperative setting. Similarly, peritoneal enhancement may relate to peritoneal inflammation or simply represent postoperative change. There are multiple loculated fluid collections within the abdomen demonstrating some degree of organization and rim enhancement: Right upper quadrant, axial image # 35/2, 5.7 x 3.2 cm. Left mid abdominal mesenteric folds, axial image # 50/2, 3.0 x 5.0 cm Right lower quadrant, axial image # 62/2, 1.5 x 4.6 cm Pelvis, axial image # 71/2, 9.4 x 12.8 x 6.6 cm (volume = 420 cm^3) There is tethering of bowel in the region of the small bowel resection, best appreciated on coronal image # 30/6 with areas of fat necrosis in this  region, best appreciated on image # 31/6. Several relatively narrowed loops of bowel, however, suggest an underlying partial small bowel obstruction. Vascular/Lymphatic: No significant vascular findings are present. No enlarged abdominal or pelvic lymph nodes. Reproductive: Prostate is unremarkable. Other: Mild diffuse subcutaneous body wall edema. Musculoskeletal: Bilateral L5 pars defects are present with grade 1 anterolisthesis L5-S1. No acute bone abnormality. IMPRESSION: 1. Interval partial small bowel resection with anastomotic staple line seen within the mid abdomen. Punctate foci of free intraperitoneal gas are likely postoperative in nature. 2. Multiple loculated fluid collections within the abdomen and pelvis demonstrating some degree of organization and rim enhancement, the largest within the pelvis measuring up to 12.8 cm in greatest dimension (volume = 420 cc). These may represent postoperative seromas, contain bowel leak, or abscesses. 3. Tethering of bowel in the region of the small bowel resection with areas of fat necrosis in this region. Several relatively narrowed loops of bowel, however, suggest an underlying partial small bowel obstruction. 4. Infiltration of the omentum and peritoneal enhancement which may relate to peritoneal inflammation or simply represent postoperative change. 5. Bilateral L5 pars defects with grade 1 anterolisthesis L5-S1. 6. Mild diffuse subcutaneous body wall edema. Electronically Signed   By: Helyn Numbers M.D.   On: 02/07/2024 22:24    Anti-infectives: Anti-infectives (From admission, onward)    Start     Dose/Rate Route Frequency Ordered Stop   02/07/24 1615  piperacillin-tazobactam (ZOSYN) IVPB 3.375 g        3.375 g 12.5 mL/hr over 240 Minutes Intravenous Every 8 hours 02/07/24 1522         Assessment/Plan: POD 7, s/p Procedure(s) with comments: EXPLORATORY LAPAROTOMY, LYSIS  OF ADHESIONS, SMALL BOWEL RESECTION (N/A) - LYSIS OF ADHESIONS, POSSIBLE  SMALL BOWEL RESECTION -CT with intraabdominal fluid collections- IR consult for drainage. -G tube broken at port - IR exchanged yesterday.  Has been clamped since replacement and no nausea.  Will try CLD. -Damp to dry dressings to midline wound -Continue Zosyn. WBC down to 12K today -Continue TPN for nutritional support -Tonsillar cancer (stage I), s/p chemo x 4 cycles and XRT -FAP, s/p TAC with J pouch -Internal jugular VTE associated with port, hep gtt -Ambulate   LOS: 9 days    Letha Cape 02/09/2024

## 2024-02-09 NOTE — Progress Notes (Signed)
 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  He underwent ex lap with small bowel resection, repair of small bowel serosa, and lysis of adhesions with general surgery on 02/01/2024.  Interval History:  No events overnight.  Underwent aspiration of fluid collection with IR this morning.  Had a bowel movement prior to procedure as well.  Assessment and Plan:  High Grade Small Bowel Obstruction S/p Total Colectomy for FAP in 2016 S/p PEG Appreciate general surgery's assistance - s/p ex lap with small bowel resection, repair of small bowel serosa x1, lysis of adhesions on 3/5 -Continue argatroban -G-tube clamped and patient tolerating - Clear liquid trial per surgery, 02/09/2024 - CT performed 3/11 showing fluid collection; IR performed aspiration on 02/09/2024, 100 cc purulent fluid - s/p G-tube exchange on 3/12   Fever -Suspected from underlying abdominal fluid collection at this time.  Also some risk for atelectasis given prolonged hospitalization and bedbound status - Blood cultures from 02/06/2024 remain negative - UA not concerning for UTI -Continue Zosyn -Follow-up abdominal fluid culture   Acute Kidney Injury-resolved Baseline creatinine < 1.0   Sinus Tachycardia Mild, follow - due to above Will ctm on tele   Dysuria UA not concerning for UTI   Right internal  jugular Thrombus related to [facility]  Hasn't been on anticoagulation prior to admission due to issues with cost Needs anticoagulation rx at discharge, will need continued close follow up outpatient Anticoagulation resumed 3/8 -> transitioned to argatroban due to subtherapeutic heparin levels - Consider repeat CT with contrast to reassess clot burden, oncology had planned for this in March   Anemia Noted, will trend post op   Malignant Neoplasm of Tonsillar Fossa Referred to oncology in Oct 2024 - treated with chemoradiation (received 4 doses of weekly cisplatin, rest held due to neutropenia and patient preference). Following with Dr. [name] from oncology   T wave inversion Leads II, III, aVF and V5, V6 Negative troponin - hold off on echo at this time   Asthma Not in exacerbation   Hyperglycemia Follow A1c (5.1)    Old records reviewed in assessment of this patient  Antimicrobials: Zosyn 02/07/2024 >> current  DVT prophylaxis:  Place and maintain sequential compression device Start: 02/09/24 1213Argatroban   Code Status:   Code Status: Full Code  Mobility Assessment (Last 72 Hours)     Mobility Assessment     Row Name 02/09/24 0710 02/08/24 2200 02/08/24 0805 02/07/24 2000 02/07/24 0725   Does patient have an order for bedrest or is patient medically unstable No - Continue assessment No - Continue assessment No - Continue assessment No - Continue assessment No - Continue assessment   What is the highest level of mobility based on the progressive mobility assessment? Level 5 (Walks with assist in room/hall) - Balance while stepping forward/back and can walk in room with assist - Complete Level 5 (Walks with assist in  room/hall) - Balance while stepping forward/back and can walk in room with assist - Complete Level 5 (Walks with assist in room/hall) - Balance while stepping forward/back and can walk in room with assist - Complete Level 5 (Walks with assist in room/hall) - Balance  while stepping forward/back and can walk in room with assist - Complete Level 5 (Walks with assist in room/hall) - Balance while stepping forward/back and can walk in room with assist - Complete    Row Name 02/07/24 0615 02/06/24 1915         Does patient have an order for bedrest or is patient medically unstable No - Continue assessment No - Continue assessment      What is the highest level of mobility based on the progressive mobility assessment? Level 5 (Walks with assist in room/hall) - Balance while stepping forward/back and can walk in room with assist - Complete Level 5 (Walks with assist in room/hall) - Balance while stepping forward/back and can walk in room with assist - Complete               Barriers to discharge: none Disposition Plan:  Home  HH orders placed:  Status is: Inpt  Objective: Blood pressure 133/74, pulse (!) 107, temperature 99.7 F (37.6 C), resp. rate 18, height 5\' 8"  (1.727 m), weight 70.1 kg, SpO2 100%.  Examination:  Physical Exam Constitutional:      General: He is not in acute distress.    Appearance: Normal appearance.  HENT:     Head: Normocephalic and atraumatic.     Mouth/Throat:     Mouth: Mucous membranes are moist.  Eyes:     Extraocular Movements: Extraocular movements intact.  Cardiovascular:     Rate and Rhythm: Normal rate and regular rhythm.  Pulmonary:     Effort: Pulmonary effort is normal. No respiratory distress.     Breath sounds: Normal breath sounds. No wheezing.  Abdominal:     General: Bowel sounds are decreased. There is distension.     Palpations: Abdomen is soft.     Tenderness: There is generalized abdominal tenderness.  Musculoskeletal:        General: Normal range of motion.     Cervical back: Normal range of motion and neck supple.  Skin:    General: Skin is warm and dry.  Neurological:     General: No focal deficit present.     Mental Status: He is alert.  Psychiatric:        Mood and Affect: Mood  normal.        Behavior: Behavior normal.      Consultants:  General surgery   Procedures:  02/01/24 PROCEDURE:  Exploratory laparotomy with small bowel resection Repair of small bowel serosa x 1 Lysis of adhesions x 100 minutes  Data Reviewed: Results for orders placed or performed during the hospital encounter of 01/30/24 (from the past 24 hours)  CBC     Status: Abnormal   Collection Time: 02/09/24  6:07 AM  Result Value Ref Range   WBC 12.2 (H) 4.0 - 10.5 K/uL   RBC 3.06 (L) 4.22 - 5.81 MIL/uL   Hemoglobin 9.3 (L) 13.0 - 17.0 g/dL   HCT 16.1 (L) 09.6 - 04.5 %   MCV 97.4 80.0 - 100.0 fL   MCH 30.4 26.0 - 34.0 pg   MCHC 31.2 30.0 - 36.0 g/dL   RDW 40.9 81.1 - 91.4 %   Platelets 248 150 - 400 K/uL   nRBC  0.0 0.0 - 0.2 %  Comprehensive metabolic panel     Status: Abnormal   Collection Time: 02/09/24  6:07 AM  Result Value Ref Range   Sodium 136 135 - 145 mmol/L   Potassium 4.4 3.5 - 5.1 mmol/L   Chloride 104 98 - 111 mmol/L   CO2 21 (L) 22 - 32 mmol/L   Glucose, Bld 129 (H) 70 - 99 mg/dL   BUN 16 6 - 20 mg/dL   Creatinine, Ser 4.54 0.61 - 1.24 mg/dL   Calcium 8.4 (L) 8.9 - 10.3 mg/dL   Total Protein 5.8 (L) 6.5 - 8.1 g/dL   Albumin 2.2 (L) 3.5 - 5.0 g/dL   AST 23 15 - 41 U/L   ALT 20 0 - 44 U/L   Alkaline Phosphatase 109 38 - 126 U/L   Total Bilirubin 0.7 0.0 - 1.2 mg/dL   GFR, Estimated >09 >81 mL/min   Anion gap 11 5 - 15  Magnesium     Status: None   Collection Time: 02/09/24  6:07 AM  Result Value Ref Range   Magnesium 2.0 1.7 - 2.4 mg/dL  Phosphorus     Status: None   Collection Time: 02/09/24  6:07 AM  Result Value Ref Range   Phosphorus 3.7 2.5 - 4.6 mg/dL  APTT     Status: Abnormal   Collection Time: 02/09/24  6:07 AM  Result Value Ref Range   aPTT 61 (H) 24 - 36 seconds    I have reviewed pertinent nursing notes, vitals, labs, and images as necessary. I have ordered labwork to follow up on as indicated.  I have reviewed the last notes from staff  over past 24 hours. I have discussed patient's care plan and test results with nursing staff, CM/SW, and other staff as appropriate.  Time spent: Greater than 50% of the 55 minute visit was spent in counseling/coordination of care for the patient as laid out in the A&P.   LOS: 9 days   Lewie Chamber, MD [facility] 02/09/2024, 1:44 PM

## 2024-02-09 NOTE — Telephone Encounter (Signed)
 CALLED PATIENT TO INFORM OF PET SCAN FOR 02-15-24- ARRIVAL TIME- 3 PM @ [facility], PATIENT TO HAVE WATER ONLY- 6 HRS. PRIOR TO SCAN, PATIENT TO RECEIVE RESULTS FROM DR. SQUIRE ON 02-21-24 @ 2:40 PM, SPOKE WITH PATIENT'S WIFE- Harry Williams AND SHE IS AWARE OF THESE APPTS. AND THE INSTRUCTIONS

## 2024-02-09 NOTE — Procedures (Signed)
 Interventional Radiology Procedure Note  Procedure: CT guided abdominal drain placement  Findings: Please refer to procedural dictation for full description. Midline lower abdominal 10 Fr pigtail drain placed, approximately 100 mL purulent fluid aspirated, sample sent for culture.  Drain to bulb suction.  Complications: None immediate  Estimated Blood Loss: < 5 ml  Recommendations: Keep to bulb suction for now. Follow cultures. IR will follow.   Marliss Coots, MD

## 2024-02-09 NOTE — Plan of Care (Signed)

## 2024-02-10 ENCOUNTER — Inpatient Hospital Stay (HOSPITAL_COMMUNITY)

## 2024-02-10 DIAGNOSIS — N179 Acute kidney failure, unspecified: Secondary | ICD-10-CM | POA: Diagnosis not present

## 2024-02-10 DIAGNOSIS — K56609 Unspecified intestinal obstruction, unspecified as to partial versus complete obstruction: Secondary | ICD-10-CM | POA: Diagnosis not present

## 2024-02-10 LAB — BASIC METABOLIC PANEL
Anion gap: 9 (ref 5–15)
BUN: 15 mg/dL (ref 6–20)
CO2: 23 mmol/L (ref 22–32)
Calcium: 8.3 mg/dL — ABNORMAL LOW (ref 8.9–10.3)
Chloride: 98 mmol/L (ref 98–111)
Creatinine, Ser: 0.75 mg/dL (ref 0.61–1.24)
GFR, Estimated: >60 mL/min (ref >60)
Glucose, Bld: 123 mg/dL — ABNORMAL HIGH (ref 70–99)
Potassium: 4.4 mmol/L (ref 3.5–5.1)
Sodium: 130 mmol/L — ABNORMAL LOW (ref 135–145)

## 2024-02-10 LAB — CBC
HCT: 28.6 % — ABNORMAL LOW (ref 39.0–52.0)
Hemoglobin: 9.2 g/dL — ABNORMAL LOW (ref 13.0–17.0)
MCH: 30.4 pg (ref 26.0–34.0)
MCHC: 32.2 g/dL (ref 30.0–36.0)
MCV: 94.4 fL (ref 80.0–100.0)
Platelets: 238 10*3/uL (ref 150–400)
RBC: 3.03 MIL/uL — ABNORMAL LOW (ref 4.22–5.81)
RDW: 14.2 % (ref 11.5–15.5)
WBC: 9.6 10*3/uL (ref 4.0–10.5)
nRBC: 0 % (ref 0.0–0.2)

## 2024-02-10 LAB — GLUCOSE, CAPILLARY: Glucose-Capillary: 103 mg/dL — ABNORMAL HIGH (ref 70–99)

## 2024-02-10 LAB — APTT: aPTT: 71 s — ABNORMAL HIGH (ref 24–36)

## 2024-02-10 MED ORDER — HYDROMORPHONE HCL 1 MG/ML IJ SOLN
0.5000 mg | INTRAMUSCULAR | Status: DC | PRN
  Administered 2024-02-10: 0.5 mg via INTRAVENOUS
  Administered 2024-02-11 – 2024-02-13 (×4): 1 mg via INTRAVENOUS
  Filled 2024-02-10 (×5): qty 1

## 2024-02-10 MED ORDER — OSMOLITE 1.2 CAL PO LIQD: 1000.0000 mL | ORAL | Status: DC

## 2024-02-10 MED ORDER — OSMOLITE 1.5 CAL PO LIQD
237.0000 mL | Freq: Four times a day (QID) | ORAL | Status: DC
  Administered 2024-02-10 – 2024-02-14 (×14): 237 mL
  Filled 2024-02-10 (×19): qty 237

## 2024-02-10 MED ORDER — TRAVASOL 10 % IV SOLN
INTRAVENOUS | Status: AC
  Filled 2024-02-10: qty 1188

## 2024-02-10 MED ORDER — METHOCARBAMOL 1000 MG/10ML IJ SOLN
1000.0000 mg | Freq: Three times a day (TID) | INTRAMUSCULAR | Status: DC
  Administered 2024-02-10 – 2024-02-14 (×13): 1000 mg via INTRAVENOUS
  Filled 2024-02-10 (×13): qty 10

## 2024-02-10 MED ORDER — OXYCODONE HCL 5 MG/5ML PO SOLN
5.0000 mg | ORAL | Status: DC | PRN
  Administered 2024-02-10: 5 mg via ORAL
  Filled 2024-02-10: qty 5

## 2024-02-10 MED ORDER — ENSURE ENLIVE PO LIQD
237.0000 mL | Freq: Two times a day (BID) | ORAL | Status: DC
  Administered 2024-02-10 – 2024-02-13 (×5): 237 mL via ORAL

## 2024-02-10 MED ORDER — SODIUM CHLORIDE 0.9% FLUSH
5.0000 mL | Freq: Three times a day (TID) | INTRAVENOUS | Status: DC
  Administered 2024-02-10 – 2024-02-14 (×13): 5 mL

## 2024-02-10 MED ORDER — MAGIC MOUTHWASH W/LIDOCAINE
15.0000 mL | Freq: Four times a day (QID) | ORAL | Status: DC | PRN
  Administered 2024-02-10: 15 mL via ORAL
  Filled 2024-02-10 (×2): qty 15

## 2024-02-10 MED ORDER — IOHEXOL 300 MG/ML  SOLN
75.0000 mL | Freq: Once | INTRAMUSCULAR | Status: AC | PRN
  Administered 2024-02-10: 75 mL via INTRAVENOUS

## 2024-02-10 NOTE — Evaluation (Signed)
 Physical Therapy Evaluation Patient Details Name: Harry Williams MRN: [id number] DOB: [birth year] Today's Date: 02/10/2024  History of Present Illness  Harry Williams is a 43 y.o. 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 Pt in bed when PT arrives, agreeable to evaluation and assessment. He states that he was getting up on his own previously but states that with increased IV access and lines he has difficulty managing. Pt is able to complete amb with 2WW x174ft at mod I with IV pole follow. Pt completes 3 steps per homesetup using reciprocal pattern up and down. AD removed and pt completes reciprocal gait pattern x150 without LOB or variation in gait line. He is able to demonstrate transfers to and from bed<>chair without AD. Pt currently with functional limitations due to the deficits listed below (see PT Problem List). Skilled PT services not indicated at this time. Mobility referral placed. Please contact the office should he need further PT services.     If plan is discharge home, recommend the following:     Can travel by private vehicle        Equipment Recommendations None recommended by PT  Recommendations for Other Services       Functional Status Assessment Patient has had a recent decline in their functional status and demonstrates the ability to make significant improvements in function in a reasonable and predictable amount of time.     Precautions / Restrictions Precautions Precautions: Fall Recall of Precautions/Restrictions:  Intact Restrictions Weight Bearing Restrictions Per Provider Order: No      Mobility  Bed Mobility Overal bed mobility: Independent                  Transfers Overall transfer level: Independent Equipment used: None                    Ambulation/Gait Ambulation/Gait assistance: Modified independent (Device/Increase time) Gait Distance (Feet): 350 Feet Assistive device: Rolling walker (2 wheels), None Gait Pattern/deviations: WFL(Within Functional Limits), Step-to pattern Gait velocity: dec     General Gait Details: pt able to amb with and without AD. He states that he has been moving around his room and getting up, but with the recent addition to IV lines, he has more difficulty managing.  Stairs Stairs: Yes Stairs assistance: Modified independent (Device/Increase time) Stair Management: Two rails, Alternating pattern Number of Stairs: 3 General stair comments: per home setup  Wheelchair Mobility     Tilt Bed    Modified Rankin (Stroke Patients Only)       Balance Overall balance assessment: Modified Independent                                           Pertinent Vitals/Pain Pain Assessment Pain Assessment: No/denies pain    Home Living Family/patient expects to be discharged to:: Private residence Living Arrangements: Spouse/significant other Available Help at Discharge: Family;Available PRN/intermittently Type of Home: House Home Access: Stairs to enter Entrance Stairs-Rails: Can reach both Entrance Stairs-Number of Steps: 3   Home Layout:  One level Home Equipment: None      Prior Function Prior Level of Function : Independent/Modified Independent                     Extremity/Trunk Assessment        Lower Extremity Assessment Lower Extremity Assessment: Overall WFL for tasks assessed    Cervical / Trunk Assessment Cervical / Trunk Assessment: Normal  Communication        Cognition Arousal:  Alert Behavior During Therapy: WFL for tasks assessed/performed   PT - Cognitive impairments: No apparent impairments                                 Cueing       General Comments General comments (skin integrity, edema, etc.): pt has difficulty completing mobility while managing lines    Exercises     Assessment/Plan    PT Assessment Patient does not need any further PT services  PT Problem List         PT Treatment Interventions      PT Goals (Current goals can be found in the Care Plan section)       Frequency       Co-evaluation               AM-PAC PT "6 Clicks" Mobility  Outcome Measure Help needed turning from your back to your side while in a flat bed without using bedrails?: None Help needed moving from lying on your back to sitting on the side of a flat bed without using bedrails?: None Help needed moving to and from a bed to a chair (including a wheelchair)?: None Help needed standing up from a chair using your arms (e.g., wheelchair or bedside chair)?: None Help needed to walk in hospital room?: None Help needed climbing 3-5 steps with a railing? : None 6 Click Score: 24    End of Session   Activity Tolerance: Patient tolerated treatment well Patient left: in chair;with call bell/phone within reach Nurse Communication: Mobility status PT Visit Diagnosis: Difficulty in walking, not elsewhere classified (R26.2)    Time: 8756-4332 PT Time Calculation (min) (ACUTE ONLY): 23 min   Charges:   PT Evaluation $PT Eval Low Complexity: 1 Low PT Treatments $Gait Training: 8-22 mins PT General Charges $$ ACUTE PT VISIT: 1 Visit         Madaline Guthrie PT Acute Rehabilitation Services Office: [phone] 02/10/2024   Evelena Peat 02/10/2024, 2:08 PM

## 2024-02-10 NOTE — Progress Notes (Addendum)
 PHARMACY - TOTAL PARENTERAL NUTRITION CONSULT NOTE   Indication: Prolonged ileus  Patient Measurements: Height: 5\' 8"  (172.7 cm) Weight: 70.1 kg (154 lb 8.7 oz) IBW/kg (Calculated) : 68.4 TPN AdjBW (KG): 70.3 Body mass index is 23.5 kg/m. Usual Weight:   Assessment: 42 yoM presented on 3/3 with vomiting and abdominal pain found to have SBO.  PMH significant for total colectomy and [facility] placement at [facility] because of multiple polyps in his colon, tonsillar cancer, and internal jugular VTE.  He has been NPO since 3/3 and Pharmacy is consulted to dose TPN for ongoing ileus.   Glucose / Insulin: No hx DM, A1c 5.1.   - CBGs/SSI Dc'd on 3/10. Serum glucose < 150 Electrolytes: E-lytes WNL, including CorrCa.  K 4.4, Mg 2 - goal K+ >=4 , Goal Mg >=2.  Renal: SCr WNL, BUN WNL Hepatic: WNL Trig 288 (3/10)  Intake / Output: - mIVF: none - CLD: none documented - Output: drains (Gtube) 145 mL down, UOP -1150 mL  LBM 3/13  GI Imaging: 3/8 KUB: No abnormal bowel dilatation. Possible right renal calculus. 3/11 CT A/P: Multiple loculated fluid collections within the abdomen & pelvis >may represent postoperative seromas, contain bowel leak, or abscesses. underlying partial small bowel obstruction.  GI Surgeries / Procedures:  3/5 Small bowel resection 3/12: G tube exchanged 3/13: Plan IA abscess drainage  Central access: Implanted port TPN start date: 3/8   Nutritional Goals: Goal TPN rate is 90 mL/hr (provides 119 g of protein and 2335 kcals per day)  RD Assessment: pending Estimated Needs Total Energy Estimated Needs: 2300-2500 Total Protein Estimated Needs: 115-130g Total Fluid Estimated Needs: >/=2L  Current Nutrition:  TPN 3/13: CLD  Plan:  Try CLD At 1800:  continue TPN @ goal rate 40mL/hr Electrolytes in TPN: Na 46mEq/L, con't K 26mEq/L, Ca 44mEq/L, Mg 27mEq/L, and Phos 79mmol/L. Cl:Ac 1:1 Add standard MVI and trace elements to TPN Monitor TPN labs on Mon/Thurs, and  prn Thiamine 100mg  IV daily x 5 days (LD 3/13)- completed  Chamille Werntz S. Merilynn Finland, PharmD, BCPS Clinical Staff Pharmacist 02/10/2024 7:33 AM

## 2024-02-10 NOTE — Progress Notes (Addendum)
 9 Days Post-Op  Subjective: CC: G-tube clamped since yesterday and tolerating cld without increased abdominal pain, distension, n/v (reports G-tube did not have to be placed to gravity). No flatus. Liquid bm yesterday.   Reports stable abdominal pain, which is mostly central, that is not well controlled w/ PCA. Reports he was on Norco liquid prior to arrival.   S/p IR drain yesterday w/ 100 mL purulent fluid aspirated and drain left.   He is mobilizing short distances in the room.   Voiding.   Afebrile, tmax 100.1. Intermittently tachycardic in the low 100's. No hypotension. WBC normalized.   Objective: Vital signs in last 24 hours: Temp:  [98 F (36.7 C)-100.1 F (37.8 C)] 99.7 F (37.6 C) (03/14 0500) Pulse Rate:  [78-114] 105 (03/14 0500) Resp:  [14-20] 14 (03/14 0840) BP: (120-135)/(73-82) 123/73 (03/14 0500) SpO2:  [98 %-100 %] 98 % (03/14 0840) FiO2 (%):  [98 %] 98 % (03/14 0840) Last BM Date : 02/09/24  Intake/Output from previous day: 03/13 0701 - 03/14 0700 In: 2841.5 [I.V.:2544.9; IV Piggyback:296.6] Out: 1295 [Urine:1150; Drains:145] Intake/Output this shift: No intake/output data recorded.  PE: Gen:  Alert, NAD, pleasant Abd: Mild distension but soft. Generalized ttp that is greatest around his incision. Midline wound clean with small area on the inferior aspect that is open w/ fibrinous tissue at the base as noted below. G-tube clamped. JP drain cloudy SS    Lab Results:  Recent Labs    02/09/24 0607 02/10/24 0527  WBC 12.2* 9.6  HGB 9.3* 9.2*  HCT 29.8* 28.6*  PLT 248 238   BMET Recent Labs    02/09/24 0607 02/10/24 0527  NA 136 130*  K 4.4 4.4  CL 104 98  CO2 21* 23  GLUCOSE 129* 123*  BUN 16 15  CREATININE 0.81 0.75  CALCIUM 8.4* 8.3*   PT/INR No results for input(s): "LABPROT", "INR" in the last 72 hours. CMP     Component Value Date/Time   NA 130 (L) 02/10/2024 0527   NA 139 06/17/2023 0936   K 4.4 02/10/2024 0527   CL  98 02/10/2024 0527   CO2 23 02/10/2024 0527   GLUCOSE 123 (H) 02/10/2024 0527   BUN 15 02/10/2024 0527   BUN 9 06/17/2023 0936   CREATININE 0.75 02/10/2024 0527   CREATININE 0.91 12/15/2023 1509   CALCIUM 8.3 (L) 02/10/2024 0527   PROT 5.8 (L) 02/09/2024 0607   PROT 7.0 06/17/2023 0936   ALBUMIN 2.2 (L) 02/09/2024 0607   ALBUMIN 4.4 06/17/2023 0936   AST 23 02/09/2024 0607   AST 26 12/15/2023 1509   ALT 20 02/09/2024 0607   ALT 25 12/15/2023 1509   ALKPHOS 109 02/09/2024 0607   BILITOT 0.7 02/09/2024 0607   BILITOT 0.3 12/15/2023 1509   GFRNONAA >60 02/10/2024 0527   GFRNONAA >60 12/15/2023 1509   GFRAA >60 12/19/2016 0514   Lipase     Component Value Date/Time   LIPASE 27 01/30/2024 1800    Studies/Results: CT GUIDED PERITONEAL/RETROPERITONEAL FLUID DRAIN BY PERC CATH Result Date: 02/09/2024 INDICATION: 43 year old male with history of postoperative abdominopelvic fluid collection concerning for abscess. EXAM: CT PERC DRAIN PERITONEAL ABCESS COMPARISON:  02/07/2024 MEDICATIONS: The patient is currently admitted to the hospital and receiving intravenous antibiotics. The antibiotics were administered within an appropriate time frame prior to the initiation of the procedure. ANESTHESIA/SEDATION: Moderate (conscious) sedation was employed during this procedure. A total of Versed 2 mg and Fentanyl 100 mcg was administered  intravenously. Moderate Sedation Time: 10 minutes. The patient's level of consciousness and vital signs were monitored continuously by radiology nursing throughout the procedure under my direct supervision. CONTRAST:  None COMPLICATIONS: None immediate. PROCEDURE: RADIATION DOSE REDUCTION: This exam was performed according to the departmental dose-optimization program which includes automated exposure control, adjustment of the mA and/or kV according to patient size and/or use of iterative reconstruction technique. Informed written consent was obtained from the patient  after a discussion of the risks, benefits and alternatives to treatment. The patient was placed supine on the CT gantry and a pre procedural CT was performed re-demonstrating the known abscess/fluid collection within the anterior pelvis. The procedure was planned. A timeout was performed prior to the initiation of the procedure. The lower abdomen was prepped and draped in the usual sterile fashion. The overlying soft tissues were anesthetized with 1% lidocaine with epinephrine. Appropriate trajectory was planned with the use of a 22 gauge spinal needle. An 18 gauge trocar needle was advanced into the abscess/fluid collection and a short Amplatz super stiff wire was coiled within the collection. Appropriate positioning was confirmed with a limited CT scan. The tract was serially dilated allowing placement of a 10 [place] all-purpose drainage catheter. Appropriate positioning was confirmed with a limited postprocedural CT scan. Proximally 100 ml of purulent fluid was aspirated. The tube was connected to a bulb suction and sutured in place. A dressing was placed. The patient tolerated the procedure well without immediate post procedural complication. IMPRESSION: Successful CT guided placement of a 10 French all purpose drain catheter into the anterior pelvic fluid collection with aspiration of 100 mL of purulent fluid. Samples were sent to the laboratory as requested by the ordering clinical team. Marliss Coots, MD [facility] Radiology Electronically Signed   By: Marliss Coots M.D.   On: 02/09/2024 13:43   DG ABDOMEN PEG TUBE LOCATION Result Date: 02/08/2024 CLINICAL DATA:  Gastrostomy status. EXAM: ABDOMEN - 1 VIEW COMPARISON:  Abdominal CT yesterday FINDINGS: Supine view of the abdomen obtained after the installation of enteric contrast through indwelling gastrostomy tube contrast is within the stomach. There is no evidence of extravasation or leak. Few air-filled  prominent loops of small bowel centrally. IMPRESSION: Gastrostomy tube within the stomach. No extravasation or leak. Electronically Signed   By: Narda Rutherford M.D.   On: 02/08/2024 22:23   IR Replc Gastro/Colonic Tube Percut W/Fluoro Result Date: 02/08/2024 INDICATION: Patient with history of tonsillar cancer, dysphagia, prior 37 French balloon retention gastrostomy tube placement in October of last year; now with broken hub/leaking. Request received for gastrostomy tube exchange. EXAM: GASTROSTOMY TUBE EXCHANGE MEDICATIONS: None ANESTHESIA/SEDATION: None CONTRAST:  Abdominal film with contrast pending FLUOROSCOPY: Abdominal film pending COMPLICATIONS: None immediate. PROCEDURE: Informed consent was obtained from the patient/spouse after a thorough discussion of the procedural risks, benefits and alternatives. All questions were addressed. A timeout was performed prior to the initiation of the procedure. Bandage and bag drain was removed from gastrostomy tube; 6 cc saline was removed from retention balloon and using manual traction the tube was removed in its entirety without immediate complications. A new 16 french entuit balloon retention gastrostomy tube was then inserted, balloon filled with 7 cc saline. Tube was flushed, secured to skin and split gauze dressing applied to site. IMPRESSION: Successful exchange for a new 16 Fr balloon-retention gastrostomy tube. Performed by: Artemio Aly RECOMMENDATIONS: The patient will return to Vascular Interventional Radiology (VIR) for routine feeding tube evaluation and exchange in  6 months. Roanna Banning, MD [facility] Radiology Electronically Signed   By: Roanna Banning M.D.   On: 02/08/2024 16:42    Anti-infectives: Anti-infectives (From admission, onward)    Start     Dose/Rate Route Frequency Ordered Stop   02/07/24 1615  piperacillin-tazobactam (ZOSYN) IVPB 3.375 g        3.375 g 12.5 mL/hr over 240  Minutes Intravenous Every 8 hours 02/07/24 1522          Assessment/Plan POD 9 s/p ex lap, SBR, repair of small bowel serosa x 1, LOA x 100 minutes for SBO by Dr. Cliffton Asters on 02/01/24 - S/p IR drain 3/13  - Cont abx. Drain cx pending.  - G-tube clamped > start TF's. Adv to FLD.  - Cont TPN - D/c PCA. Add PO pain options  - Midline wound opened 3/12. BID WTD - Ambulate, PT - Pulm toilet  FEN - G-tube currently clamped - start trickle TF's, FLD for comfort, full rate TPN, IVF per primary. If any worsening abdominal pain, distension, n/v make npo, place g-tube to gravity and let our team know.  VTE - SCDs, argatroban gtt ID - Zosyn. Afebrile. WBC normalized.  Foley - None, spont void.   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

## 2024-02-10 NOTE — Progress Notes (Signed)
 PHARMACY - ANTICOAGULATION CONSULT NOTE  Pharmacy Consult for argatroban Indication: hx right internal jugular (IJ) vein thromboembolism  No Known Allergies  Patient Measurements: Height: 5\' 8"  (172.7 cm) Weight: 70.1 kg (154 lb 8.7 oz) IBW/kg (Calculated) : 68.4 Heparin Dosing Weight: 70 kg  Vital Signs: Temp: 99.7 F (37.6 C) (03/14 0500) Temp Source: Oral (03/14 0203) BP: 123/73 (03/14 0500) Pulse Rate: 105 (03/14 0500)  Labs: Recent Labs    02/07/24 1235 02/07/24 1929 02/08/24 0038 02/08/24 0545 02/09/24 0607 02/10/24 0527  HGB  --    < > 10.0*  --  9.3* 9.2*  HCT  --   --  33.3*  --  29.8* 28.6*  PLT  --   --  227  --  248 238  APTT  --    < >  --  71* 61* 71*  HEPARINUNFRC 0.15*  --   --   --   --   --   CREATININE  --   --  0.71  --  0.81 0.75   < > = values in this interval not displayed.    Estimated Creatinine Clearance: 117.6 mL/min (by C-G formula based on SCr of 0.75 mg/dL).   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 He presented to the ED on 01/30/24 with c/o n/v and abdominal pain. He was subsequently found to have SBO and underwent exp lap with small bowel resection, repair of small bowel serosa and lysis of adhesions on 02/01/24. Heparin drip started on admission for internal jugular thrombus and d/ced on 02/01/24 for  surgery. Pharmacy has been consulted on 02/04/24 to resume heparin drip back with no bolus. Transition heparin to argatroban on 02/07/2024 given ongoing subtherapeutic heparin levels.   Today, 02/10/2024: aPTT 71 - therapeutic on argatroban 1 mcg/kg/min (4.22 mL/hr) CBC: Hg 9.2- low/stable, PLTs WNL No bleeding reported.   Goal of Therapy:  aPTT 50-90 sec Monitor platelets by anticoagulation protocol: Yes   Plan:  - Continue infusion rate of argatroban IV at 1 mcg/kg/min - Monitor daily CBC & aPTT along with sign/symptoms of bleeding    Harry Williams, Student-PharmD 02/10/2024 7:19 AM

## 2024-02-10 NOTE — Progress Notes (Signed)
 Progress Note    Harry Williams   UXL:[id number]  DOB: [birth year]  DOA: 01/30/2024     10 PCP: [name], MD  Initial CC: abdominal pain  Hospital Course: Mr. Griswold is a 43 year old male with malignancy of tonsillar fossa status post chemoradiation, oral mucositis due to radiation and with PEG tube for feeding, FAP s/p total colectomy in 2016, rectal bleeding/hemorrhoids, right internal jugular vein thrombus in 10/2023 and stopped taking anticoagulation x 1 month, asthma, AVN of left hip, anxiety/depression/PTSD presented with abdominal pain nausea vomiting no BM x 5 days, in the ED found to have high-grade SBO and admitted for further management.  He underwent ex lap with small bowel resection, repair of small bowel serosa, and lysis of adhesions with general surgery on 02/01/2024.  Interval History:  No events overnight.  Continues to have bowel movements and no N/V.  Endorsed he's on osomolite at home daily and is now being transitioned off TPN back to osmolite.   Assessment and Plan:  High Grade Small Bowel Obstruction S/p Total Colectomy for FAP in 2016 S/p PEG Appreciate general surgery's assistance - s/p ex lap with small bowel resection, repair of small bowel serosa x1, lysis of adhesions on 3/5 -Continue argatroban; will discuss timing of transition back to oral AC with surgery  -G-tube clamped and patient tolerating - Tolerated CLD and has been advanced to full liquids; Osmolite being resumed per surgery as well and TPN tapering - CT performed 3/11 showing fluid collection; IR performed aspiration on 02/09/2024, 100 cc purulent fluid - s/p G-tube exchange on 3/12   Right internal jugular Thrombus related to [facility]  Hasn't been on anticoagulation prior to admission due to issues with cost Needs anticoagulation rx at discharge, will need continued close follow up outpatient Anticoagulation resumed 3/8 -> transitioned to argatroban due to subtherapeutic heparin levels -  Consider repeat CT with contrast to reassess clot burden, oncology had planned for this in March; will discuss with Dr. [name] also   Fever -Suspected from underlying abdominal fluid collection at this time.  Also some risk for atelectasis given prolonged hospitalization and bedbound status - Blood cultures from 02/06/2024 remain negative - UA not concerning for UTI -Continue Zosyn -Follow-up abdominal fluid culture; few GNR so far   Acute Kidney Injury-resolved Baseline creatinine < 1.0   Sinus Tachycardia Mild, follow - due to above Will ctm on tele   Dysuria UA not concerning for UTI   Anemia Noted, will trend post op   Malignant Neoplasm of Tonsillar Fossa Referred to oncology in Oct 2024 - treated with chemoradiation (received 4 doses of weekly cisplatin, rest held due to neutropenia and patient preference). Following with Dr. [name] from oncology   T wave inversion Leads II, III, aVF and V5, V6 Negative troponin - hold off on echo at this time   Asthma Not in exacerbation   Hyperglycemia Follow A1c (5.1)    Old records reviewed in assessment of this patient  Antimicrobials: Zosyn 02/07/2024 >> current  DVT prophylaxis:  Place and maintain sequential compression device Start: 02/09/24 1213Argatroban   Code Status:   Code Status: Full Code  Mobility Assessment (Last 72 Hours)     Mobility Assessment     Row Name 02/09/24 2100 02/09/24 0710 02/08/24 2200 02/08/24 0805 02/07/24 2000   Does patient have an order for bedrest or is patient medically unstable No - Continue assessment No - Continue assessment No - Continue assessment No - Continue assessment No - Continue  assessment   What is the highest level of mobility based on the progressive mobility assessment? Level 5 (Walks with assist in room/hall) - Balance while stepping forward/back and can walk in room with assist - Complete Level 5 (Walks with assist in room/hall) - Balance while stepping forward/back and  can walk in room with assist - Complete Level 5 (Walks with assist in room/hall) - Balance while stepping forward/back and can walk in room with assist - Complete Level 5 (Walks with assist in room/hall) - Balance while stepping forward/back and can walk in room with assist - Complete Level 5 (Walks with assist in room/hall) - Balance while stepping forward/back and can walk in room with assist - Complete            Barriers to discharge: none Disposition Plan:  Home  HH orders placed:  Status is: Inpt  Objective: Blood pressure 110/74, pulse 82, temperature 98.2 F (36.8 C), resp. rate 13, height 5\' 8"  (1.727 m), weight 69.5 kg, SpO2 100%.  Examination:  Physical Exam Constitutional:      General: He is not in acute distress.    Appearance: Normal appearance.  HENT:     Head: Normocephalic and atraumatic.     Mouth/Throat:     Mouth: Mucous membranes are moist.  Eyes:     Extraocular Movements: Extraocular movements intact.  Cardiovascular:     Rate and Rhythm: Normal rate and regular rhythm.  Pulmonary:     Effort: Pulmonary effort is normal. No respiratory distress.     Breath sounds: Normal breath sounds. No wheezing.  Abdominal:     General: Bowel sounds are normal. There is distension (very mild firmness).     Tenderness: There is generalized abdominal tenderness.  Musculoskeletal:        General: Normal range of motion.     Cervical back: Normal range of motion and neck supple.  Skin:    General: Skin is warm and dry.  Neurological:     General: No focal deficit present.     Mental Status: He is alert.  Psychiatric:        Mood and Affect: Mood normal.        Behavior: Behavior normal.      Consultants:  General surgery   Procedures:  02/01/24 PROCEDURE:  Exploratory laparotomy with small bowel resection Repair of small bowel serosa x 1 Lysis of adhesions x 100 minutes  Data Reviewed: Results for orders placed or performed during the hospital encounter  of 01/30/24 (from the past 24 hours)  CBC     Status: Abnormal   Collection Time: 02/10/24  5:27 AM  Result Value Ref Range   WBC 9.6 4.0 - 10.5 K/uL   RBC 3.03 (L) 4.22 - 5.81 MIL/uL   Hemoglobin 9.2 (L) 13.0 - 17.0 g/dL   HCT 13.0 (L) 86.5 - 78.4 %   MCV 94.4 80.0 - 100.0 fL   MCH 30.4 26.0 - 34.0 pg   MCHC 32.2 30.0 - 36.0 g/dL   RDW 69.6 29.5 - 28.4 %   Platelets 238 150 - 400 K/uL   nRBC 0.0 0.0 - 0.2 %  APTT     Status: Abnormal   Collection Time: 02/10/24  5:27 AM  Result Value Ref Range   aPTT 71 (H) 24 - 36 seconds  Basic metabolic panel     Status: Abnormal   Collection Time: 02/10/24  5:27 AM  Result Value Ref Range   Sodium 130 (L) 135 -  145 mmol/L   Potassium 4.4 3.5 - 5.1 mmol/L   Chloride 98 98 - 111 mmol/L   CO2 23 22 - 32 mmol/L   Glucose, Bld 123 (H) 70 - 99 mg/dL   BUN 15 6 - 20 mg/dL   Creatinine, Ser 1.61 0.61 - 1.24 mg/dL   Calcium 8.3 (L) 8.9 - 10.3 mg/dL   GFR, Estimated >09 >60 mL/min   Anion gap 9 5 - 15    I have reviewed pertinent nursing notes, vitals, labs, and images as necessary. I have ordered labwork to follow up on as indicated.  I have reviewed the last notes from staff over past 24 hours. I have discussed patient's care plan and test results with nursing staff, CM/SW, and other staff as appropriate.  Time spent: Greater than 50% of the 55 minute visit was spent in counseling/coordination of care for the patient as laid out in the A&P.   LOS: 10 days   Lewie Chamber, MD [facility] 02/10/2024, 1:21 PM

## 2024-02-10 NOTE — Plan of Care (Signed)
  Problem: Education: Goal: Knowledge of General Education information will improve Description: Including pain rating scale, medication(s)/side effects and non-pharmacologic comfort measures Outcome: Progressing   Problem: Activity: Goal: Risk for activity intolerance will decrease Outcome: Progressing   Problem: Nutrition: Goal: Adequate nutrition will be maintained Outcome: Progressing   Problem: Coping: Goal: Level of anxiety will decrease Outcome: Progressing   Problem: Elimination: Goal: Will not experience complications related to urinary retention Outcome: Progressing   Problem: Pain Managment: Goal: General experience of comfort will improve and/or be controlled Outcome: Progressing   Problem: Safety: Goal: Ability to remain free from injury will improve Outcome: Progressing   Problem: Skin Integrity: Goal: Risk for impaired skin integrity will decrease Outcome: Progressing

## 2024-02-10 NOTE — Progress Notes (Signed)
 Nutrition Follow-up  INTERVENTION:   -TPN management per Pharmacy -if tolerates tube feeding, recommend weaning  Per surgery okay to resume home tube feeding regimen: -Administer 1 carton (237 ml) Osmolite 1.5 QID via PEG -Flush with 60 ml free water before and after each feeding -Provides 1420 kcals, 59g protein and 1204 ml H2O  -Ensure Plus High Protein po BID, each supplement provides 350 kcal and 20 grams of protein.   NUTRITION DIAGNOSIS:   Increased nutrient needs related to cancer and cancer related treatments as evidenced by estimated needs.  Ongoing.  GOAL:   Patient will meet greater than or equal to 90% of their needs  Meeting with TPN  MONITOR:   Labs, Weight trends  REASON FOR ASSESSMENT:   Consult Enteral/tube feeding initiation and management  ASSESSMENT:   Pt admitted with emesis and abdominal pain secondary to SBO. PMH significant for  malignant neoplasm of the tonsillar fossa s/p chemoradiation, oral mucositis d/t radiation therapy, dysphagia s/p PEG tube, FAP s/p total colectomy (2016), RIJ thrombus.  3/3: admitted 3/5: s/p exlap with small bowel resection, repair of small bowel serosa, lysis of adhesions 3/8: start TPN d/t ongoing ileus 3/12: G-tube exchanged 3/13: Clear liquids  Currently TPN continues to run at goal rate of 90 ml/hr. Providing ~ 2371 kcals, 118g protein.  Recommend weaning once pt tolerating tube feeds.  Pt on full liquids starting this morning. Reports he tolerated clears with no issue. Surgery wants to resume G-tube feeds, placed order. Today will receive 3 feedings. Tomorrow will start QID feedings.  Pt would like to have Ensure ordered as well, placed order.  If unable to eat much or drink Ensure, pt will need 7 cartons of Osmolite 1.5 daily to meet nutritional needs. If able to drink supplements can continue with QID.   Admission weight: 155 lbs Current weight: 153 lbs  Medications: Magic Mouthwash w/ lidocaine  Labs  reviewed: Low Na  Diet Order:   Diet Order             Diet full liquid Fluid consistency: Thin  Diet effective now                   EDUCATION NEEDS:   No education needs have been identified at this time  Skin:  Skin Assessment: Skin Integrity Issues: Skin Integrity Issues:: Incisions Incisions: closed abdominal incision  Last BM:  3/13  Height:   Ht Readings from Last 1 Encounters:  01/30/24 5\' 8"  (1.727 m)    Weight:   Wt Readings from Last 1 Encounters:  02/10/24 69.5 kg    BMI:  Body mass index is 23.3 kg/m.  Estimated Nutritional Needs:   Kcal:  2300-2500  Protein:  115-130g  Fluid:  >/=2L  Tilda Franco, MS, RD, LDN Inpatient Clinical Dietitian Contact via Secure chat

## 2024-02-10 NOTE — Progress Notes (Signed)
 Referring Physician(s): Connor,C  Supervising Physician: Oley Balm  Patient Status:  Harry Williams  Chief Complaint: Abdominal pain, post op abdominal fluid collections; s/p RLQ drain placement 02/09/24   Subjective: Pt doing ok this am; still has some mid abd discomfort; afebrile, denies N/V   Allergies: Patient has no known allergies.  Medications: Prior to Admission medications   Medication Sig Start Date End Date Taking? Authorizing Provider  baclofen (LIORESAL) 10 MG tablet Take 1 tablet (10 mg total) by mouth 3 (three) times daily. Patient taking differently: Take 10 mg by mouth 3 (three) times daily as needed for muscle spasms. 10/04/23  Yes Walisiewicz, Yvonna Alanis E, PA-C  celecoxib (CELEBREX) 200 MG capsule Place 1 capsule (200 mg total) into feeding tube daily as needed for moderate pain (pain score 4-6). 11/29/23  Yes [name], MD  dicyclomine (BENTYL) 20 MG tablet Take 1 tablet (20 mg total) by mouth 2 (two) times daily. Patient taking differently: Take 20 mg by mouth 2 (two) times daily as needed for spasms. 01/26/24  Yes Palumbo, April, MD  HYDROcodone-acetaminophen (HYCET) 7.5-325 mg/15 ml solution Take 10 mLs by mouth every 4 (four) hours as needed for moderate pain (pain score 4-6). 12/13/23  Yes Pasam, [name], MD  lidocaine (XYLOCAINE) 2 % solution Patient: Mix 1part 2% viscous lidocaine, 1part H20. Swish & swallow 10mL of diluted mixture, before meals and at bedtime, up to QID Patient taking differently: Use as directed 15 mLs in the mouth or throat every 6 (six) hours as needed for mouth pain. Mix 1 part 2% viscous lidocaine with 1 part water. Swish & swallow 10 ml's of diluted mixture up to 4 times a day PRN 10/10/23  Yes [name], MD  Nutritional Supplements (FEEDING SUPPLEMENT, OSMOLITE 1.5 CAL,) LIQD 7 cartons Osmolite 1.5 split over four feeding daily. Flush tube with 60 ml water before and after each bolus. Provide additional 3 1/2 cups  (830 ml) water/day to meet hydration needs. Provides 2485 kcal, 104.3 g protein, 1267 ml free water from formula (2577 ml total water with flushes). 1659 ml/day meets 100% RDI 10/18/23  Yes [name], MD  omeprazole (PRILOSEC) 20 MG capsule Take 20 mg by mouth See admin instructions. 20 mg per tube once a day PRN   Yes [provider]  ondansetron (ZOFRAN-ODT) 8 MG disintegrating tablet 8mg  ODT q8hours prn nausea Patient taking differently: Take 8 mg by mouth every 8 (eight) hours as needed for nausea or vomiting. 8mg  ODT q8hours prn nausea 01/26/24  Yes Palumbo, April, MD  apixaban (ELIQUIS) 5 MG TABS tablet Take 1 tablet (5 mg total) by mouth 2 (two) times daily. Patient not taking: Reported on 01/31/2024 12/15/23   Pasam, [name], MD  PARoxetine (PAXIL CR) 12.5 MG 24 hr tablet Take 1 tablet (12.5 mg total) by mouth daily. Patient not taking: Reported on 11/01/2023 08/30/23   Thresa Ross, MD  rivaroxaban (XARELTO) 20 MG TABS tablet Take 1 tablet (20 mg total) by mouth daily with supper. Patient not taking: Reported on 01/31/2024 01/02/24   Pasam, [name], MD  scopolamine (TRANSDERM-SCOP) 1 MG/3DAYS Place 1 patch (1.5 mg total) onto the skin every 3 (three) days. Patient not taking: Reported on 01/31/2024 10/31/23   [name], MD     Vital Signs: BP 110/74 (BP Location: Left Arm)   Pulse 82   Temp 98.2 F (36.8 C)   Resp 13   Ht 5\' 8"  (1.727 m)   Wt 154 lb  8.7 oz (70.1 kg)   SpO2 100%   BMI 23.50 kg/m   Physical Exam awake/alert; RLQ drain intact, insertion site ok, not sig tender, OP 145 cc turbid reddish/beige fluid; drain flushed without difficulty  Imaging: CT GUIDED PERITONEAL/RETROPERITONEAL FLUID DRAIN BY PERC CATH Result Date: 02/09/2024 INDICATION: 43 year old male with history of postoperative abdominopelvic fluid collection concerning for abscess. EXAM: CT PERC DRAIN PERITONEAL ABCESS COMPARISON:  02/07/2024 MEDICATIONS: The patient is currently admitted to the  hospital and receiving intravenous antibiotics. The antibiotics were administered within an appropriate time frame prior to the initiation of the procedure. ANESTHESIA/SEDATION: Moderate (conscious) sedation was employed during this procedure. A total of Versed 2 mg and Fentanyl 100 mcg was administered intravenously. Moderate Sedation Time: 10 minutes. The patient's level of consciousness and vital signs were monitored continuously by radiology nursing throughout the procedure under my direct supervision. CONTRAST:  None COMPLICATIONS: None immediate. PROCEDURE: RADIATION DOSE REDUCTION: This exam was performed according to the departmental dose-optimization program which includes automated exposure control, adjustment of the mA and/or kV according to patient size and/or use of iterative reconstruction technique. Informed written consent was obtained from the patient after a discussion of the risks, benefits and alternatives to treatment. The patient was placed supine on the CT gantry and a pre procedural CT was performed re-demonstrating the known abscess/fluid collection within the anterior pelvis. The procedure was planned. A timeout was performed prior to the initiation of the procedure. The lower abdomen was prepped and draped in the usual sterile fashion. The overlying soft tissues were anesthetized with 1% lidocaine with epinephrine. Appropriate trajectory was planned with the use of a 22 gauge spinal needle. An 18 gauge trocar needle was advanced into the abscess/fluid collection and a short Amplatz super stiff wire was coiled within the collection. Appropriate positioning was confirmed with a limited CT scan. The tract was serially dilated allowing placement of a 10 [place] all-purpose drainage catheter. Appropriate positioning was confirmed with a limited postprocedural CT scan. Proximally 100 ml of purulent fluid was aspirated. The tube was connected to a bulb suction and sutured in place. A dressing was  placed. The patient tolerated the procedure well without immediate post procedural complication. IMPRESSION: Successful CT guided placement of a 10 French all purpose drain catheter into the anterior pelvic fluid collection with aspiration of 100 mL of purulent fluid. Samples were sent to the laboratory as requested by the ordering clinical team. Marliss Coots, MD [facility] Radiology Electronically Signed   By: Marliss Coots M.D.   On: 02/09/2024 13:43   DG ABDOMEN PEG TUBE LOCATION Result Date: 02/08/2024 CLINICAL DATA:  Gastrostomy status. EXAM: ABDOMEN - 1 VIEW COMPARISON:  Abdominal CT yesterday FINDINGS: Supine view of the abdomen obtained after the installation of enteric contrast through indwelling gastrostomy tube contrast is within the stomach. There is no evidence of extravasation or leak. Few air-filled prominent loops of small bowel centrally. IMPRESSION: Gastrostomy tube within the stomach. No extravasation or leak. Electronically Signed   By: Narda Rutherford M.D.   On: 02/08/2024 22:23   IR Replc Gastro/Colonic Tube Percut W/Fluoro Result Date: 02/08/2024 INDICATION: Patient with history of tonsillar cancer, dysphagia, prior 43 French balloon retention gastrostomy tube placement in October of last year; now with broken hub/leaking. Request received for gastrostomy tube exchange. EXAM: GASTROSTOMY TUBE EXCHANGE MEDICATIONS: None ANESTHESIA/SEDATION: None CONTRAST:  Abdominal film with contrast pending FLUOROSCOPY: Abdominal film pending COMPLICATIONS: None immediate. PROCEDURE: Informed consent was obtained from the  patient/spouse after a thorough discussion of the procedural risks, benefits and alternatives. All questions were addressed. A timeout was performed prior to the initiation of the procedure. Bandage and bag drain was removed from gastrostomy tube; 6 cc saline was removed from retention balloon and using manual traction the tube was  removed in its entirety without immediate complications. A new 16 french entuit balloon retention gastrostomy tube was then inserted, balloon filled with 7 cc saline. Tube was flushed, secured to skin and split gauze dressing applied to site. IMPRESSION: Successful exchange for a new 16 Fr balloon-retention gastrostomy tube. Performed by: Artemio Aly RECOMMENDATIONS: The patient will return to Vascular Interventional Radiology (VIR) for routine feeding tube evaluation and exchange in 6 months. Roanna Banning, MD [facility] Radiology Electronically Signed   By: Roanna Banning M.D.   On: 02/08/2024 16:42   CT ABDOMEN PELVIS W CONTRAST Result Date: 02/07/2024 CLINICAL DATA:  Postoperative abdominal pain EXAM: CT ABDOMEN AND PELVIS WITH CONTRAST TECHNIQUE: Multidetector CT imaging of the abdomen and pelvis was performed using the standard protocol following bolus administration of intravenous contrast. RADIATION DOSE REDUCTION: This exam was performed according to the departmental dose-optimization program which includes automated exposure control, adjustment of the mA and/or kV according to patient size and/or use of iterative reconstruction technique. CONTRAST:  OMNIPAQUE IOHEXOL 300 MG/ML SOLN, 30mL OMNIPAQUE IOHEXOL 300 MG/ML SOLN COMPARISON:  01/30/2024 FINDINGS: Lower chest: Bibasilar atelectasis. Central venous catheter tip noted within the superior right atrium. No acute abnormality. Hepatobiliary: No focal liver abnormality is seen. No gallstones, gallbladder wall thickening, or biliary dilatation. Pancreas: Unremarkable Spleen: Unremarkable Adrenals/Urinary Tract: Adrenal glands are unremarkable. Kidneys are normal, without renal calculi, focal lesion, or hydronephrosis. Bladder is unremarkable. Stomach/Bowel: Surgical changes of probable subtotal colectomy are again identified. There has been interval partial small bowel resection with anastomotic  staple line seen within the mid abdomen. Punctate foci of free intraperitoneal gas or likely postoperative in nature. There is infiltration of the omentum which is nonspecific in the immediate postoperative setting. Similarly, peritoneal enhancement may relate to peritoneal inflammation or simply represent postoperative change. There are multiple loculated fluid collections within the abdomen demonstrating some degree of organization and rim enhancement: Right upper quadrant, axial image # 35/2, 5.7 x 3.2 cm. Left mid abdominal mesenteric folds, axial image # 50/2, 3.0 x 5.0 cm Right lower quadrant, axial image # 62/2, 1.5 x 4.6 cm Pelvis, axial image # 71/2, 9.4 x 12.8 x 6.6 cm (volume = 420 cm^3) There is tethering of bowel in the region of the small bowel resection, best appreciated on coronal image # 30/6 with areas of fat necrosis in this region, best appreciated on image # 31/6. Several relatively narrowed loops of bowel, however, suggest an underlying partial small bowel obstruction. Vascular/Lymphatic: No significant vascular findings are present. No enlarged abdominal or pelvic lymph nodes. Reproductive: Prostate is unremarkable. Other: Mild diffuse subcutaneous body wall edema. Musculoskeletal: Bilateral L5 pars defects are present with grade 1 anterolisthesis L5-S1. No acute bone abnormality. IMPRESSION: 1. Interval partial small bowel resection with anastomotic staple line seen within the mid abdomen. Punctate foci of free intraperitoneal gas are likely postoperative in nature. 2. Multiple loculated fluid collections within the abdomen and pelvis demonstrating some degree of organization and rim enhancement, the largest within the pelvis measuring up to 12.8 cm in greatest dimension (volume = 420 cc). These may represent postoperative seromas, contain bowel leak, or abscesses. 3. Tethering of bowel in the  region of the small bowel resection with areas of fat necrosis in this region. Several relatively  narrowed loops of bowel, however, suggest an underlying partial small bowel obstruction. 4. Infiltration of the omentum and peritoneal enhancement which may relate to peritoneal inflammation or simply represent postoperative change. 5. Bilateral L5 pars defects with grade 1 anterolisthesis L5-S1. 6. Mild diffuse subcutaneous body wall edema. Electronically Signed   By: Helyn Numbers M.D.   On: 02/07/2024 22:24    Labs:  CBC: Recent Labs    02/07/24 0020 02/08/24 0038 02/09/24 0607 02/10/24 0527  WBC 13.8* 15.9* 12.2* 9.6  HGB 9.4* 10.0* 9.3* 9.2*  HCT 29.8* 33.3* 29.8* 28.6*  PLT 218 227 248 238    COAGS: Recent Labs    09/22/23 0826 01/31/24 0455 02/07/24 1929 02/08/24 0036 02/08/24 0545 02/09/24 0607 02/10/24 0527  INR 0.9 1.1  --   --   --   --   --   APTT  --  25   < > 53* 71* 61* 71*   < > = values in this interval not displayed.    BMP: Recent Labs    02/07/24 0020 02/08/24 0038 02/09/24 0607 02/10/24 0527  NA 136 137 136 130*  K 3.7 3.7 4.4 4.4  CL 104 105 104 98  CO2 23 21* 21* 23  GLUCOSE 142* 110* 129* 123*  BUN 18 16 16 15   CALCIUM 8.1* 8.2* 8.4* 8.3*  CREATININE 0.68 0.71 0.81 0.75  GFRNONAA >60 >60 >60 >60    LIVER FUNCTION TESTS: Recent Labs    02/04/24 0728 02/05/24 0112 02/06/24 0457 02/09/24 0607  BILITOT 0.9 0.8 0.4 0.7  AST 12* 9* 10* 23  ALT 8 8 8 20   ALKPHOS 53 52 59 109  PROT 5.4* 5.5* 5.5* 5.8*  ALBUMIN 2.4* 2.3* 2.2* 2.2*    Assessment and Plan: Harry Williams is a 43 y.o. male with past medical history of anxiety, asthma, avascular necrosis of left hip, depression, DVT, PTSD, tonsillar cancer with prior chemoradiation, FAP status post total colectomy 2016, right internal jugular thrombus admitted [facility] on 3/3 with nausea, vomiting, abdominal pain, small bowel obstruction, status post small bowel resection on 02/01/2024; patient also status post Port-A-Cath and gastrostomy tube placements on 09/22/23 ; now with  post op abd fluid collections, s/p RLQ drain placement 3/13; afebrile, WBC nl, hgb stable, creat nl, drain fl cx - gm neg rods, blood cx neg to date  Drain Location: RLQ Size: Fr size: 10 Fr Date of placement: 02/09/24  Currently to: Drain collection device: suction bulb 24 hour output:  Output by Drain (mL) 02/08/24 0701 - 02/08/24 1900 02/08/24 1901 - 02/09/24 0700 02/09/24 0701 - 02/09/24 1900 02/09/24 1901 - 02/10/24 0700 02/10/24 0701 - 02/10/24 1039  Closed System Drain 1 RLQ Other (Comment) 10 Fr.   100 45   Gastrostomy/Enterostomy Gastrostomy 16 Fr. LUQ 400          Current examination: Flushes/aspirates easily.  Insertion site unremarkable. Suture and stat lock in place. Dressed appropriately.   Plan: Continue TID flushes with 5 cc NS. Record output Q shift. Dressing changes QD or PRN if soiled.  Call IR APP or on call IR MD if difficulty flushing or sudden change in drain output.  Repeat imaging/possible drain injection once output < 10 mL/QD (excluding flush material). Consideration for drain removal if output is < 10 mL/QD (excluding flush material), pending discussion with the providing surgical service.  Discharge planning:  Please contact IR APP or on call IR MD prior to patient d/c to ensure appropriate follow up plans are in place. Typically patient will follow up with IR clinic 10-14 days post d/c for repeat imaging/possible drain injection. IR scheduler will contact patient with date/time of appointment. Patient will need to flush drain QD with 5 cc NS, record output QD, dressing changes every 2-3 days or earlier if soiled.   IR will continue to follow - please call with questions or concerns.      Electronically Signed: D. Jeananne Rama, PA-C 02/10/2024, 10:34 AM   I spent a total of 15 Minutes at the the patient's bedside AND on the patient's hospital floor or unit, greater than 50% of which was counseling/coordinating care for pelvic fluid collection  drain    Patient ID: Harry Williams, male   DOB: 02-Mar-1981, 43 y.o.   MRN: [id number]

## 2024-02-11 DIAGNOSIS — N179 Acute kidney failure, unspecified: Secondary | ICD-10-CM | POA: Diagnosis not present

## 2024-02-11 DIAGNOSIS — K56609 Unspecified intestinal obstruction, unspecified as to partial versus complete obstruction: Secondary | ICD-10-CM | POA: Diagnosis not present

## 2024-02-11 LAB — CBC
HCT: 31.1 % — ABNORMAL LOW (ref 39.0–52.0)
Hemoglobin: 9.9 g/dL — ABNORMAL LOW (ref 13.0–17.0)
MCH: 29.9 pg (ref 26.0–34.0)
MCHC: 31.8 g/dL (ref 30.0–36.0)
MCV: 94 fL (ref 80.0–100.0)
Platelets: 311 10*3/uL (ref 150–400)
RBC: 3.31 MIL/uL — ABNORMAL LOW (ref 4.22–5.81)
RDW: 14.1 % (ref 11.5–15.5)
WBC: 11.3 10*3/uL — ABNORMAL HIGH (ref 4.0–10.5)
nRBC: 0 % (ref 0.0–0.2)

## 2024-02-11 LAB — CULTURE, BLOOD (ROUTINE X 2)
Culture: NO GROWTH
Culture: NO GROWTH
Special Requests: ADEQUATE
Special Requests: ADEQUATE

## 2024-02-11 LAB — GLUCOSE, CAPILLARY
Glucose-Capillary: 142 mg/dL — ABNORMAL HIGH (ref 70–99)
Glucose-Capillary: 106 mg/dL — ABNORMAL HIGH (ref 70–99)
Glucose-Capillary: 90 mg/dL (ref 70–99)

## 2024-02-11 LAB — APTT
aPTT: 44 s — ABNORMAL HIGH (ref 24–36)
aPTT: 57 s — ABNORMAL HIGH (ref 24–36)

## 2024-02-11 MED ORDER — ACETAMINOPHEN 500 MG PO TABS
1000.0000 mg | ORAL_TABLET | Freq: Four times a day (QID) | ORAL | Status: DC
  Administered 2024-02-12 (×4): 1000 mg
  Filled 2024-02-11 (×5): qty 2

## 2024-02-11 NOTE — Progress Notes (Signed)
 10 Days Post-Op  Subjective: CC: Feels well, he reports that tube feeds were started but nothing is recorded.  No nausea, pain or vomiting with that, and continues to have bowel movements.  Pain is getting better.  Objective: Vital signs in last 24 hours: Temp:  [98 F (36.7 C)-98.5 F (36.9 C)] 98.3 F (36.8 C) (03/15 0850) Pulse Rate:  [78-101] 78 (03/15 0850) Resp:  [13-18] 16 (03/15 0850) BP: (110-122)/(66-79) 113/74 (03/15 0850) SpO2:  [98 %-100 %] 99 % (03/15 0850) Last BM Date : 02/10/24  Intake/Output from previous day: 03/14 0701 - 03/15 0700 In: 1074.9 [I.V.:1005.5; IV Piggyback:69.4] Out: 900 [Urine:850; Drains:50] Intake/Output this shift: No intake/output data recorded.  PE: Gen:  Alert, NAD, pleasant Abd: Soft, minimally distended.  Tenderness significantly improved. Midline wound with dressing intact. G-tube clamped. JP drain cloudy SS    Lab Results:  Recent Labs    02/10/24 0527 02/11/24 0655  WBC 9.6 11.3*  HGB 9.2* 9.9*  HCT 28.6* 31.1*  PLT 238 311   BMET Recent Labs    02/09/24 0607 02/10/24 0527  NA 136 130*  K 4.4 4.4  CL 104 98  CO2 21* 23  GLUCOSE 129* 123*  BUN 16 15  CREATININE 0.81 0.75  CALCIUM 8.4* 8.3*   PT/INR No results for input(s): "LABPROT", "INR" in the last 72 hours. CMP     Component Value Date/Time   NA 130 (L) 02/10/2024 0527   NA 139 06/17/2023 0936   K 4.4 02/10/2024 0527   CL 98 02/10/2024 0527   CO2 23 02/10/2024 0527   GLUCOSE 123 (H) 02/10/2024 0527   BUN 15 02/10/2024 0527   BUN 9 06/17/2023 0936   CREATININE 0.75 02/10/2024 0527   CREATININE 0.91 12/15/2023 1509   CALCIUM 8.3 (L) 02/10/2024 0527   PROT 5.8 (L) 02/09/2024 0607   PROT 7.0 06/17/2023 0936   ALBUMIN 2.2 (L) 02/09/2024 0607   ALBUMIN 4.4 06/17/2023 0936   AST 23 02/09/2024 0607   AST 26 12/15/2023 1509   ALT 20 02/09/2024 0607   ALT 25 12/15/2023 1509   ALKPHOS 109 02/09/2024 0607   BILITOT 0.7 02/09/2024 0607   BILITOT  0.3 12/15/2023 1509   GFRNONAA >60 02/10/2024 0527   GFRNONAA >60 12/15/2023 1509   GFRAA >60 12/19/2016 0514   Lipase     Component Value Date/Time   LIPASE 27 01/30/2024 1800    Studies/Results: CT SOFT TISSUE NECK W CONTRAST Result Date: 02/10/2024 CLINICAL DATA:  Right IJ thrombus EXAM: CT NECK WITH CONTRAST TECHNIQUE: Multidetector CT imaging of the neck was performed using the standard protocol following the bolus administration of intravenous contrast. RADIATION DOSE REDUCTION: This exam was performed according to the departmental dose-optimization program which includes automated exposure control, adjustment of the mA and/or kV according to patient size and/or use of iterative reconstruction technique. CONTRAST:  75mL OMNIPAQUE IOHEXOL 300 MG/ML  SOLN COMPARISON:  11/18/2023 FINDINGS: Pharynx and larynx: Small retropharyngeal effusion. Normal epiglottis. There is mild soft tissue asymmetry in the left vallecula. No discrete mass visualized. Salivary glands: No inflammation, mass, or stone. Thyroid: Normal. Lymph nodes: None enlarged or abnormal density. 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 Limited intracranial: Negative. Visualized orbits: Negative. Mastoids and visualized paranasal sinuses: Clear. Skeleton: No acute or aggressive process. Upper chest: Negative. Other: None. IMPRESSION: 1. 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 2. Small retropharyngeal effusion, likely reactive. 3. Mild soft tissue asymmetry  in the left vallecula without discrete mass visualized. Direct visualization is recommended. Electronically Signed   By: Deatra Robinson M.D.   On: 02/10/2024 19:35   CT GUIDED PERITONEAL/RETROPERITONEAL FLUID DRAIN BY PERC CATH Result Date: 02/09/2024 INDICATION: 43 year old male with history of postoperative abdominopelvic fluid collection concerning for abscess. EXAM: CT PERC DRAIN PERITONEAL ABCESS COMPARISON:  02/07/2024  MEDICATIONS: The patient is currently admitted to the hospital and receiving intravenous antibiotics. The antibiotics were administered within an appropriate time frame prior to the initiation of the procedure. ANESTHESIA/SEDATION: Moderate (conscious) sedation was employed during this procedure. A total of Versed 2 mg and Fentanyl 100 mcg was administered intravenously. Moderate Sedation Time: 10 minutes. The patient's level of consciousness and vital signs were monitored continuously by radiology nursing throughout the procedure under my direct supervision. CONTRAST:  None COMPLICATIONS: None immediate. PROCEDURE: RADIATION DOSE REDUCTION: This exam was performed according to the departmental dose-optimization program which includes automated exposure control, adjustment of the mA and/or kV according to patient size and/or use of iterative reconstruction technique. Informed written consent was obtained from the patient after a discussion of the risks, benefits and alternatives to treatment. The patient was placed supine on the CT gantry and a pre procedural CT was performed re-demonstrating the known abscess/fluid collection within the anterior pelvis. The procedure was planned. A timeout was performed prior to the initiation of the procedure. The lower abdomen was prepped and draped in the usual sterile fashion. The overlying soft tissues were anesthetized with 1% lidocaine with epinephrine. Appropriate trajectory was planned with the use of a 22 gauge spinal needle. An 18 gauge trocar needle was advanced into the abscess/fluid collection and a short Amplatz super stiff wire was coiled within the collection. Appropriate positioning was confirmed with a limited CT scan. The tract was serially dilated allowing placement of a 10 [place] all-purpose drainage catheter. Appropriate positioning was confirmed with a limited postprocedural CT scan. Proximally 100 ml of purulent fluid was aspirated. The tube was connected  to a bulb suction and sutured in place. A dressing was placed. The patient tolerated the procedure well without immediate post procedural complication. IMPRESSION: Successful CT guided placement of a 10 French all purpose drain catheter into the anterior pelvic fluid collection with aspiration of 100 mL of purulent fluid. Samples were sent to the laboratory as requested by the ordering clinical team. Marliss Coots, MD [facility] Radiology Electronically Signed   By: Marliss Coots M.D.   On: 02/09/2024 13:43    Anti-infectives: Anti-infectives (From admission, onward)    Start     Dose/Rate Route Frequency Ordered Stop   02/07/24 1615  piperacillin-tazobactam (ZOSYN) IVPB 3.375 g        3.375 g 12.5 mL/hr over 240 Minutes Intravenous Every 8 hours 02/07/24 1522          Assessment/Plan POD 10 s/p ex lap, SBR, repair of small bowel serosa x 1, LOA x 100 minutes for SBO by Dr. Cliffton Asters on 02/01/24 - S/p IR drain 3/13  - Cont abx. Drain cx pending.  - G-tube clamped > start TF's.  Full liquid diet as desired for comfort -Okay to wean off TPN -Altered modal pain control - Midline wound opened 3/12. BID WTD - Ambulate, PT - Pulm toilet  FEN - G-tube currently clamped - start trickle TF's, FLD for comfort, wean off TPN, IVF per primary. If any worsening abdominal pain, distension, n/v make npo, place g-tube to gravity and let our  team know.  VTE - SCDs, argatroban gtt ID - Zosyn. Afebrile.  Foley - None, spont void.   Hx Tonsillar cancer (stage I), s/p chemo x 4 cycles and XRT Hx FAP, s/p TAC with J pouch 2016 Hx Internal jugular VTE associated with port    LOS: 11 days    Berna Bue, MD [facility] Surgery 02/11/2024, 8:54 AM Please see Amion for pager number during day hours 7:00am-4:30pm

## 2024-02-11 NOTE — Progress Notes (Signed)
 PHARMACY - TOTAL PARENTERAL NUTRITION CONSULT NOTE   Indication: Prolonged ileus  Patient Measurements: Height: 5\' 8"  (172.7 cm) Weight: 69.5 kg (153 lb 3.5 oz) IBW/kg (Calculated) : 68.4 TPN AdjBW (KG): 70.3 Body mass index is 23.3 kg/m. Usual Weight:   Assessment: 42 yoM presented on 3/3 with vomiting and abdominal pain found to have SBO.  PMH significant for total colectomy and J-pouch placement at [facility] because of multiple polyps in his colon, tonsillar cancer, and internal jugular VTE.  He has been NPO since 3/3 and Pharmacy is consulted to dose TPN for ongoing ileus.   Glucose / Insulin: No hx DM, A1c 5.1.   - CBGs/SSI Dc'd on 3/10. Serum glucose < 150 Electrolytes: E-lytes WNL, including CorrCa.  K 4.4, Mg 2 - goal K+ >=4 , Goal Mg >=2.  Renal: SCr WNL, BUN WNL Hepatic: WNL Trig 288 (3/10)  Intake / Output: - mIVF: none - CLD: none documented - Output: drains 50 mL down, UOP: 850 ml LBM 3/14  GI Imaging: 3/8 KUB: No abnormal bowel dilatation. Possible right renal calculus. 3/11 CT A/P: Multiple loculated fluid collections within the abdomen & pelvis >may represent postoperative seromas, contain bowel leak, or abscesses. underlying partial small bowel obstruction.  GI Surgeries / Procedures:  3/5 Small bowel resection 3/12: G tube exchanged 3/13: Plan IA abscess drainage  Central access: Implanted port TPN start date: 3/8   Nutritional Goals: Goal TPN rate is 90 mL/hr (provides 119 g of protein and 2335 kcals per day)  RD Assessment: pending Estimated Needs Total Energy Estimated Needs: 2300-2500 Total Protein Estimated Needs: 115-130g Total Fluid Estimated Needs: >/=2L  Current Nutrition:  TPN 3/13: CLD  Plan:  CLD>>FLD + Osmolite 1.5 ( QID) Wean TPN off today. Decrease to 80ml/hr and discontinue at 1800.   Vinette Crites S. Merilynn Finland, PharmD, BCPS Clinical Staff Pharmacist 02/11/2024 8:59 AM

## 2024-02-11 NOTE — Progress Notes (Signed)
 Referring Physician(s): Connor,C  Supervising Physician: Irish Lack  Patient Status:  [facility] - In-pt  Chief Complaint: Abdominal pain, post op abdominal fluid collections; s/p RLQ drain placement 02/09/24    Subjective: Pt doing ok this am; still has some mid abd discomfort; afebrile, denies N/V    Allergies: Patient has no known allergies.  Medications: Prior to Admission medications   Medication Sig Start Date End Date Taking? Authorizing Provider  baclofen (LIORESAL) 10 MG tablet Take 1 tablet (10 mg total) by mouth 3 (three) times daily. Patient taking differently: Take 10 mg by mouth 3 (three) times daily as needed for muscle spasms. 10/04/23  Yes Walisiewicz, Yvonna Alanis E, PA-C  celecoxib (CELEBREX) 200 MG capsule Place 1 capsule (200 mg total) into feeding tube daily as needed for moderate pain (pain score 4-6). 11/29/23  Yes [name], MD  dicyclomine (BENTYL) 20 MG tablet Take 1 tablet (20 mg total) by mouth 2 (two) times daily. Patient taking differently: Take 20 mg by mouth 2 (two) times daily as needed for spasms. 01/26/24  Yes Palumbo, April, MD  HYDROcodone-acetaminophen (HYCET) 7.5-325 mg/15 ml solution Take 10 mLs by mouth every 4 (four) hours as needed for moderate pain (pain score 4-6). 12/13/23  Yes Pasam, [name], MD  lidocaine (XYLOCAINE) 2 % solution Patient: Mix 1part 2% viscous lidocaine, 1part H20. Swish & swallow 10mL of diluted mixture, before meals and at bedtime, up to QID Patient taking differently: Use as directed 15 mLs in the mouth or throat every 6 (six) hours as needed for mouth pain. Mix 1 part 2% viscous lidocaine with 1 part water. Swish & swallow 10 ml's of diluted mixture up to 4 times a day PRN 10/10/23  Yes [name], MD  Nutritional Supplements (FEEDING SUPPLEMENT, OSMOLITE 1.5 CAL,) LIQD 7 cartons Osmolite 1.5 split over four feeding daily. Flush tube with 60 ml water before and after each bolus. Provide additional 3 1/2 cups  (830 ml) water/day to meet hydration needs. Provides 2485 kcal, 104.3 g protein, 1267 ml free water from formula (2577 ml total water with flushes). 1659 ml/day meets 100% RDI 10/18/23  Yes [name], MD  omeprazole (PRILOSEC) 20 MG capsule Take 20 mg by mouth See admin instructions. 20 mg per tube once a day PRN   Yes [provider]  ondansetron (ZOFRAN-ODT) 8 MG disintegrating tablet 8mg  ODT q8hours prn nausea Patient taking differently: Take 8 mg by mouth every 8 (eight) hours as needed for nausea or vomiting. 8mg  ODT q8hours prn nausea 01/26/24  Yes Palumbo, April, MD  apixaban (ELIQUIS) 5 MG TABS tablet Take 1 tablet (5 mg total) by mouth 2 (two) times daily. Patient not taking: Reported on 01/31/2024 12/15/23   Pasam, [name], MD  PARoxetine (PAXIL CR) 12.5 MG 24 hr tablet Take 1 tablet (12.5 mg total) by mouth daily. Patient not taking: Reported on 11/01/2023 08/30/23   Thresa Ross, MD  rivaroxaban (XARELTO) 20 MG TABS tablet Take 1 tablet (20 mg total) by mouth daily with supper. Patient not taking: Reported on 01/31/2024 01/02/24   Pasam, [name], MD  scopolamine (TRANSDERM-SCOP) 1 MG/3DAYS Place 1 patch (1.5 mg total) onto the skin every 3 (three) days. Patient not taking: Reported on 01/31/2024 10/31/23   [name], MD     Vital Signs: BP 113/74 (BP Location: Left Arm)   Pulse 78   Temp 98.3 F (36.8 C)   Resp 16   Ht 5\' 8"  (1.727 m)   Wt  153 lb 3.5 oz (69.5 kg)   SpO2 99%   BMI 23.30 kg/m   Physical Exam awake/alert; RLQ drain intact, insertion site ok, not sig tender, OP 50 cc turbid reddish/beige fluid; drain flushed without difficulty   Imaging: CT SOFT TISSUE NECK W CONTRAST Result Date: 02/10/2024 CLINICAL DATA:  Right IJ thrombus EXAM: CT NECK WITH CONTRAST TECHNIQUE: Multidetector CT imaging of the neck was performed using the standard protocol following the bolus administration of intravenous contrast. RADIATION DOSE REDUCTION: This exam was performed  according to the departmental dose-optimization program which includes automated exposure control, adjustment of the mA and/or kV according to patient size and/or use of iterative reconstruction technique. CONTRAST:  75mL OMNIPAQUE IOHEXOL 300 MG/ML  SOLN COMPARISON:  11/18/2023 FINDINGS: Pharynx and larynx: Small retropharyngeal effusion. Normal epiglottis. There is mild soft tissue asymmetry in the left vallecula. No discrete mass visualized. Salivary glands: No inflammation, mass, or stone. Thyroid: Normal. Lymph nodes: None enlarged or abnormal density. 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 Limited intracranial: Negative. Visualized orbits: Negative. Mastoids and visualized paranasal sinuses: Clear. Skeleton: No acute or aggressive process. Upper chest: Negative. Other: None. IMPRESSION: 1. 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 2. Small retropharyngeal effusion, likely reactive. 3. Mild soft tissue asymmetry in the left vallecula without discrete mass visualized. Direct visualization is recommended. Electronically Signed   By: Deatra Robinson M.D.   On: 02/10/2024 19:35   CT GUIDED PERITONEAL/RETROPERITONEAL FLUID DRAIN BY PERC CATH Result Date: 02/09/2024 INDICATION: 43 year old male with history of postoperative abdominopelvic fluid collection concerning for abscess. EXAM: CT PERC DRAIN PERITONEAL ABCESS COMPARISON:  02/07/2024 MEDICATIONS: The patient is currently admitted to the hospital and receiving intravenous antibiotics. The antibiotics were administered within an appropriate time frame prior to the initiation of the procedure. ANESTHESIA/SEDATION: Moderate (conscious) sedation was employed during this procedure. A total of Versed 2 mg and Fentanyl 100 mcg was administered intravenously. Moderate Sedation Time: 10 minutes. The patient's level of consciousness and vital signs were monitored continuously by radiology nursing throughout the  procedure under my direct supervision. CONTRAST:  None COMPLICATIONS: None immediate. PROCEDURE: RADIATION DOSE REDUCTION: This exam was performed according to the departmental dose-optimization program which includes automated exposure control, adjustment of the mA and/or kV according to patient size and/or use of iterative reconstruction technique. Informed written consent was obtained from the patient after a discussion of the risks, benefits and alternatives to treatment. The patient was placed supine on the CT gantry and a pre procedural CT was performed re-demonstrating the known abscess/fluid collection within the anterior pelvis. The procedure was planned. A timeout was performed prior to the initiation of the procedure. The lower abdomen was prepped and draped in the usual sterile fashion. The overlying soft tissues were anesthetized with 1% lidocaine with epinephrine. Appropriate trajectory was planned with the use of a 22 gauge spinal needle. An 18 gauge trocar needle was advanced into the abscess/fluid collection and a short Amplatz super stiff wire was coiled within the collection. Appropriate positioning was confirmed with a limited CT scan. The tract was serially dilated allowing placement of a 10 [place] all-purpose drainage catheter. Appropriate positioning was confirmed with a limited postprocedural CT scan. Proximally 100 ml of purulent fluid was aspirated. The tube was connected to a bulb suction and sutured in place. A dressing was placed. The patient tolerated the procedure well without immediate post procedural complication. IMPRESSION: Successful CT guided placement of a 10 [place] all purpose  drain catheter into the anterior pelvic fluid collection with aspiration of 100 mL of purulent fluid. Samples were sent to the laboratory as requested by the ordering clinical team. Marliss Coots, MD [facility] Electronically Signed   By: Marliss Coots M.D.   On: 02/09/2024 13:43   DG ABDOMEN PEG TUBE LOCATION Result Date: 02/08/2024 CLINICAL DATA:  Gastrostomy status. EXAM: ABDOMEN - 1 VIEW COMPARISON:  Abdominal CT yesterday FINDINGS: Supine view of the abdomen obtained after the installation of enteric contrast through indwelling gastrostomy tube contrast is within the stomach. There is no evidence of extravasation or leak. Few air-filled prominent loops of small bowel centrally. IMPRESSION: Gastrostomy tube within the stomach. No extravasation or leak. Electronically Signed   By: Narda Rutherford M.D.   On: 02/08/2024 22:23   IR Replc Gastro/Colonic Tube Percut W/Fluoro Result Date: 02/08/2024 INDICATION: Patient with history of tonsillar cancer, dysphagia, prior 53 French balloon retention gastrostomy tube placement in October of last year; now with broken hub/leaking. Request received for gastrostomy tube exchange. EXAM: GASTROSTOMY TUBE EXCHANGE MEDICATIONS: None ANESTHESIA/SEDATION: None CONTRAST:  Abdominal film with contrast pending FLUOROSCOPY: Abdominal film pending COMPLICATIONS: None immediate. PROCEDURE: Informed consent was obtained from the patient/spouse after a thorough discussion of the procedural risks, benefits and alternatives. All questions were addressed. A timeout was performed prior to the initiation of the procedure. Bandage and bag drain was removed from gastrostomy tube; 6 cc saline was removed from retention balloon and using manual traction the tube was removed in its entirety without immediate complications. A new 16 french entuit balloon retention gastrostomy tube was then inserted, balloon filled with 7 cc saline. Tube was flushed, secured to skin and split gauze dressing applied to site. IMPRESSION: Successful exchange for a new 16 Fr balloon-retention gastrostomy tube. Performed by: Artemio Aly RECOMMENDATIONS: The patient will return to Vascular Interventional Radiology (VIR) for routine feeding tube  evaluation and exchange in 6 months. Roanna Banning, MD [facility] Electronically Signed   By: Roanna Banning M.D.   On: 02/08/2024 16:42   CT ABDOMEN PELVIS W CONTRAST Result Date: 02/07/2024 CLINICAL DATA:  Postoperative abdominal pain EXAM: CT ABDOMEN AND PELVIS WITH CONTRAST TECHNIQUE: Multidetector CT imaging of the abdomen and pelvis was performed using the standard protocol following bolus administration of intravenous contrast. RADIATION DOSE REDUCTION: This exam was performed according to the departmental dose-optimization program which includes automated exposure control, adjustment of the mA and/or kV according to patient size and/or use of iterative reconstruction technique. CONTRAST:  OMNIPAQUE IOHEXOL 300 MG/ML SOLN, 30mL OMNIPAQUE IOHEXOL 300 MG/ML SOLN COMPARISON:  01/30/2024 FINDINGS: Lower chest: Bibasilar atelectasis. Central venous catheter tip noted within the superior right atrium. No acute abnormality. Hepatobiliary: No focal liver abnormality is seen. No gallstones, gallbladder wall thickening, or biliary dilatation. Pancreas: Unremarkable Spleen: Unremarkable Adrenals/Urinary Tract: Adrenal glands are unremarkable. Kidneys are normal, without renal calculi, focal lesion, or hydronephrosis. Bladder is unremarkable. Stomach/Bowel: Surgical changes of probable subtotal colectomy are again identified. There has been interval partial small bowel resection with anastomotic staple line seen within the mid abdomen. Punctate foci of free intraperitoneal gas or likely postoperative in nature. There is infiltration of the omentum which is nonspecific in the immediate postoperative setting. Similarly, peritoneal enhancement may relate to peritoneal inflammation or simply represent postoperative change. There are multiple loculated fluid collections within the abdomen demonstrating some degree of organization and rim enhancement: Right upper  quadrant, axial image #  35/2, 5.7 x 3.2 cm. Left mid abdominal mesenteric folds, axial image # 50/2, 3.0 x 5.0 cm Right lower quadrant, axial image # 62/2, 1.5 x 4.6 cm Pelvis, axial image # 71/2, 9.4 x 12.8 x 6.6 cm (volume = 420 cm^3) There is tethering of bowel in the region of the small bowel resection, best appreciated on coronal image # 30/6 with areas of fat necrosis in this region, best appreciated on image # 31/6. Several relatively narrowed loops of bowel, however, suggest an underlying partial small bowel obstruction. Vascular/Lymphatic: No significant vascular findings are present. No enlarged abdominal or pelvic lymph nodes. Reproductive: Prostate is unremarkable. Other: Mild diffuse subcutaneous body wall edema. Musculoskeletal: Bilateral L5 pars defects are present with grade 1 anterolisthesis L5-S1. No acute bone abnormality. IMPRESSION: 1. Interval partial small bowel resection with anastomotic staple line seen within the mid abdomen. Punctate foci of free intraperitoneal gas are likely postoperative in nature. 2. Multiple loculated fluid collections within the abdomen and pelvis demonstrating some degree of organization and rim enhancement, the largest within the pelvis measuring up to 12.8 cm in greatest dimension (volume = 420 cc). These may represent postoperative seromas, contain bowel leak, or abscesses. 3. Tethering of bowel in the region of the small bowel resection with areas of fat necrosis in this region. Several relatively narrowed loops of bowel, however, suggest an underlying partial small bowel obstruction. 4. Infiltration of the omentum and peritoneal enhancement which may relate to peritoneal inflammation or simply represent postoperative change. 5. Bilateral L5 pars defects with grade 1 anterolisthesis L5-S1. 6. Mild diffuse subcutaneous body wall edema. Electronically Signed   By: Helyn Numbers M.D.   On: 02/07/2024 22:24    Labs:  CBC: Recent Labs    02/08/24 0038  02/09/24 0607 02/10/24 0527 02/11/24 0655  WBC 15.9* 12.2* 9.6 11.3*  HGB 10.0* 9.3* 9.2* 9.9*  HCT 33.3* 29.8* 28.6* 31.1*  PLT 227 248 238 311    COAGS: Recent Labs    09/22/23 0826 01/31/24 0455 02/07/24 1929 02/08/24 0545 02/09/24 0607 02/10/24 0527 02/11/24 0655  INR 0.9 1.1  --   --   --   --   --   APTT  --  25   < > 71* 61* 71* 44*   < > = values in this interval not displayed.    BMP: Recent Labs    02/07/24 0020 02/08/24 0038 02/09/24 0607 02/10/24 0527  NA 136 137 136 130*  K 3.7 3.7 4.4 4.4  CL 104 105 104 98  CO2 23 21* 21* 23  GLUCOSE 142* 110* 129* 123*  BUN 18 16 16 15   CALCIUM 8.1* 8.2* 8.4* 8.3*  CREATININE 0.68 0.71 0.81 0.75  GFRNONAA >60 >60 >60 >60    LIVER FUNCTION TESTS: Recent Labs    02/04/24 0728 02/05/24 0112 02/06/24 0457 02/09/24 0607  BILITOT 0.9 0.8 0.4 0.7  AST 12* 9* 10* 23  ALT 8 8 8 20   ALKPHOS 53 52 59 109  PROT 5.4* 5.5* 5.5* 5.8*  ALBUMIN 2.4* 2.3* 2.2* 2.2*    Assessment and Plan: Harry Williams is a 43 y.o. male with past medical history of anxiety, asthma, avascular necrosis of left hip, depression, DVT, PTSD, tonsillar cancer with prior chemoradiation, FAP status post total colectomy 2016, right internal jugular thrombus admitted [facility] on 3/3 with nausea, vomiting, abdominal pain, small bowel obstruction, status post small bowel resection on 02/01/2024; patient also status post Port-A-Cath and gastrostomy tube placements  on 09/22/23 ; now with post op abd fluid collections, s/p RLQ drain placement 3/13; afebrile, WBC 11.3, hgb 9.9, drain fl cx pend, blood cx neg   Drain Location: RLQ Size: Fr size: 10 Fr Date of placement: 02/09/24  Currently to: Drain collection device: suction bulb 24 hour output:  Output by Drain (mL) 02/09/24 0701 - 02/09/24 1900 02/09/24 1901 - 02/10/24 0700 02/10/24 0701 - 02/10/24 1900 02/10/24 1901 - 02/11/24 0700 02/11/24 0701 - 02/11/24 0932  Closed System Drain 1  RLQ Other (Comment) 10 Fr. 100 45 50    Gastrostomy/Enterostomy Gastrostomy 16 Fr. LUQ           Current examination: Flushes/aspirates easily.  Insertion site unremarkable. Suture and stat lock in place. Dressed appropriately.   Plan: Continue TID flushes with 5 cc NS. Record output Q shift. Dressing changes QD or PRN if soiled.  Call IR APP or on call IR MD if difficulty flushing or sudden change in drain output.  Repeat imaging/possible drain injection once output < 10 mL/QD (excluding flush material). Consideration for drain removal if output is < 10 mL/QD (excluding flush material), pending discussion with the providing surgical service.  Discharge planning: Please contact IR APP or on call IR MD prior to patient d/c to ensure appropriate follow up plans are in place. Typically patient will follow up with IR clinic 10-14 days post d/c for repeat imaging/possible drain injection. IR scheduler will contact patient with date/time of appointment. Patient will need to flush drain QD with 5 cc NS, record output QD, dressing changes every 2-3 days or earlier if soiled.   IR will continue to follow - please call with questions or concerns.      Electronically Signed: D. Jeananne Rama, PA-C 02/11/2024, 9:26 AM   I spent a total of 15 Minutes at the the patient's bedside AND on the patient's hospital floor or unit, greater than 50% of which was counseling/coordinating care for pelvic fluid collection drain     Patient ID: Emileo Semel, male   DOB: 1980-12-31, 43 y.o.   MRN: [id number]

## 2024-02-11 NOTE — Progress Notes (Signed)
 Pharmacy Brief Note - Evening Anticoagulation Follow Up:  Pt on argatroban for history of right internal jugular vein thromboembolism. For full history, see note by Adalberto Cole, PharmD from earlier today.   Assessment:  aPTT = 57 seconds remains therapeutic on argatroban infusion of 1 mcg/kg/min.  No infusion concerns reported  Goal: aPTT 50 - 90 seconds  Plan: Continue argatroban at current rate of 1 mcg/kg/min (4.22 mL/hr) Recheck aPTT, CBC with AM labs tomorrow Monitor for signs of bleeding  Cindi Carbon, PharmD 02/11/24 4:26 PM

## 2024-02-11 NOTE — Progress Notes (Addendum)
 Pharmacy Antibiotic Note  Maximino Cozzolino is a 43 y.o. male admitted on 01/30/2024 with intra-abdominal infection.  Pharmacy has been consulted for Zosyn dosing.  Spiked a fever today. WBC increasing slightly.   Plan: Continue Zosyn 3.375 gm IV q8h (4 hour infusion) Monitor clinical picture, renal function F/U C&S, abx deescalation / LOT  Height: 5\' 8"  (172.7 cm) Weight: 69.5 kg (153 lb 3.5 oz) IBW/kg (Calculated) : 68.4  Temp (24hrs), Avg:98.2 F (36.8 C), Min:98 F (36.7 C), Max:98.5 F (36.9 C)  Recent Labs  Lab 02/06/24 0457 02/07/24 0020 02/08/24 0038 02/09/24 0607 02/10/24 0527 02/11/24 0655  WBC 11.3* 13.8* 15.9* 12.2* 9.6 11.3*  CREATININE 0.95 0.68 0.71 0.81 0.75  --     Estimated Creatinine Clearance: 117.6 mL/min (by C-G formula based on SCr of 0.75 mg/dL).    No Known Allergies  Antimicrobials this admission: Zosyn 3/11 >>   Dose adjustments this admission:   Microbiology results: 3/10 UA: neg 3/10 BCx2: NGF 3/13 abscess >> few GNR   Adalberto Cole, PharmD, BCPS 02/11/2024 10:44 AM

## 2024-02-11 NOTE — Progress Notes (Signed)
 Progress Note    Mylin Hirano   ZOX:[id number]  DOB: [birth year]  DOA: 01/30/2024     11 PCP: [name], MD  Initial CC: abdominal pain  Hospital Course: Mr. 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  He underwent ex lap with small bowel resection, repair of small bowel serosa, and lysis of adhesions with general surgery on 02/01/2024.  Interval History:  No events overnight. No fever over past 24 hours. Seems to be more comfortable in general. Ambulated some yesterday too.  Abdomen a little softer and continues to have bowel movements and tolerating orals. Osmolite also resumed yesterday and TPN will be weaned off today.  Reviewed CT neck with him as well.   Assessment and Plan:  High Grade Small Bowel Obstruction S/p Total Colectomy for FAP in 2016 S/p PEG Appreciate general surgery's assistance - s/p ex lap with small bowel resection, repair of small bowel serosa x1, lysis of adhesions on 3/5 -Continue argatroban; will discuss timing of transition back to [facility] with surgery (he was actually on Lovenox at home prior to admission for affordability)  -G-tube clamped and patient tolerating - Tolerated CLD and has been advanced to full liquids; Osmolite being resumed per surgery as well and TPN tapering - CT performed 3/11 showing fluid collection; IR performed aspiration on 02/09/2024, 100 cc purulent fluid - s/p G-tube exchange on 3/12   Right internal jugular Thrombus related to [facility]  - was unable to afford oral AC, but tells me he  was actually on what sounds like Lovenox shots; will confirm with pharmacy if we're able to verify this - repeat CT neck obtained 3/14 showing persistent right internal jugular thrombus - suspect will still need anticoagulation at discharge; clarifying if on Lovenox as noted above and will discuss with Dr. [name] on Monday as well   Fever -Suspected from underlying abdominal fluid collection at this time but also starting to wonder if component of fever possibly in setting of malignancy and/or even persistent thrombus in RIJ.  Also some risk for atelectasis given prolonged hospitalization and bedbound.  - Blood cultures from 02/06/2024 remain negative - UA not concerning for UTI -Continue Zosyn -Follow-up abdominal fluid culture; few E. Coli (pansensitive)   Acute Kidney Injury-resolved Baseline creatinine < 1.0   Sinus Tachycardia Mild, follow - due to above Will ctm on tele   Dysuria UA not concerning for UTI   Normocytic Anemia - stable in 9-10 g/dL range    Malignant Neoplasm of Tonsillar Fossa Referred to oncology in Oct 2024 - treated with chemoradiation (received 4 doses of weekly cisplatin, rest held due to neutropenia and patient preference). Following with Dr. [name] from oncology   T wave inversion Leads II, III, aVF and V5, V6 Negative troponin - hold off on echo at this time   Asthma Not in exacerbation   Hyperglycemia Follow A1c (5.1)    Old records reviewed in assessment of this patient  Antimicrobials: Zosyn 02/07/2024 >> current  DVT prophylaxis:  Place and maintain sequential compression device Start: 02/09/24 1213Argatroban   Code Status:   Code  Status: Full Code  Mobility Assessment (Last 72 Hours)     Mobility Assessment     Row Name 02/10/24 1945 02/10/24 1357 02/10/24 0948 02/09/24 2100 02/09/24 0710   Does patient have an order for bedrest or is patient medically unstable No - Continue assessment -- No - Continue assessment No - Continue  assessment No - Continue assessment   What is the highest level of mobility based on the progressive mobility assessment? Level 6 (Walks independently in room and hall) - Balance while walking in room without assist - Complete Level 6 (Walks independently in room and hall) - Balance while walking in room without assist - Complete Level 6 (Walks independently in room and hall) - Balance while walking in room without assist - Complete Level 5 (Walks with assist in room/hall) - Balance while stepping forward/back and can walk in room with assist - Complete Level 5 (Walks with assist in room/hall) - Balance while stepping forward/back and can walk in room with assist - Complete    Row Name 02/08/24 2200           Does patient have an order for bedrest or is patient medically unstable No - Continue assessment       What is the highest level of mobility based on the progressive mobility assessment? Level 5 (Walks with assist in room/hall) - Balance while stepping forward/back and can walk in room with assist - Complete                Barriers to discharge: none Disposition Plan:  Home  HH orders placed:  Status is: Inpt  Objective: Blood pressure 113/74, pulse 78, temperature 98.3 F (36.8 C), resp. rate 16, height 5\' 8"  (1.727 m), weight 69.5 kg, SpO2 99%.  Examination:  Physical Exam Constitutional:      General: He is not in acute distress.    Appearance: Normal appearance.  HENT:     Head: Normocephalic and atraumatic.     Mouth/Throat:     Mouth: Mucous membranes are moist.  Eyes:     Extraocular Movements: Extraocular movements intact.  Cardiovascular:     Rate and Rhythm: Normal rate and regular rhythm.  Pulmonary:     Effort: Pulmonary effort is normal. No respiratory distress.     Breath sounds: Normal breath sounds. No wheezing.  Abdominal:     General: Bowel sounds are normal. There is distension (very mild firmness).     Tenderness: There is generalized abdominal  tenderness.  Musculoskeletal:        General: Normal range of motion.     Cervical back: Normal range of motion and neck supple.  Skin:    General: Skin is warm and dry.  Neurological:     General: No focal deficit present.     Mental Status: He is alert.  Psychiatric:        Mood and Affect: Mood normal.        Behavior: Behavior normal.      Consultants:  General surgery   Procedures:  02/01/24 PROCEDURE:  Exploratory laparotomy with small bowel resection Repair of small bowel serosa x 1 Lysis of adhesions x 100 minutes  Data Reviewed: Results for orders placed or performed during the hospital encounter of 01/30/24 (from the past 24 hours)  Glucose, capillary     Status: Abnormal   Collection Time: 02/10/24  3:08 PM  Result Value Ref Range   Glucose-Capillary 103 (H) 70 - 99 mg/dL  APTT  Status: Abnormal   Collection Time: 02/11/24  6:55 AM  Result Value Ref Range   aPTT 44 (H) 24 - 36 seconds  CBC     Status: Abnormal   Collection Time: 02/11/24  6:55 AM  Result Value Ref Range   WBC 11.3 (H) 4.0 - 10.5 K/uL   RBC 3.31 (L) 4.22 - 5.81 MIL/uL   Hemoglobin 9.9 (L) 13.0 - 17.0 g/dL   HCT 16.1 (L) 09.6 - 04.5 %   MCV 94.0 80.0 - 100.0 fL   MCH 29.9 26.0 - 34.0 pg   MCHC 31.8 30.0 - 36.0 g/dL   RDW 40.9 81.1 - 91.4 %   Platelets 311 150 - 400 K/uL   nRBC 0.0 0.0 - 0.2 %  Glucose, capillary     Status: Abnormal   Collection Time: 02/11/24  7:55 AM  Result Value Ref Range   Glucose-Capillary 142 (H) 70 - 99 mg/dL  Glucose, capillary     Status: Abnormal   Collection Time: 02/11/24 11:39 AM  Result Value Ref Range   Glucose-Capillary 106 (H) 70 - 99 mg/dL    I have reviewed pertinent nursing notes, vitals, labs, and images as necessary. I have ordered labwork to follow up on as indicated.  I have reviewed the last notes from staff over past 24 hours. I have discussed patient's care plan and test results with nursing staff, CM/SW, and other staff as  appropriate.  Time spent: Greater than 50% of the 55 minute visit was spent in counseling/coordination of care for the patient as laid out in the A&P.   LOS: 11 days   Lewie Chamber, MD [facility] 02/11/2024, 1:02 PM

## 2024-02-11 NOTE — Progress Notes (Signed)
 PHARMACY - ANTICOAGULATION CONSULT NOTE  Pharmacy Consult for argatroban Indication: hx right internal jugular (IJ) vein thromboembolism  No Known Allergies  Patient Measurements: Height: 5\' 8"  (172.7 cm) Weight: 69.5 kg (153 lb 3.5 oz) IBW/kg (Calculated) : 68.4 Heparin Dosing Weight: 70 kg  Vital Signs: Temp: 98.3 F (36.8 C) (03/15 0850) BP: 113/74 (03/15 0850) Pulse Rate: 78 (03/15 0850)  Labs: Recent Labs    02/09/24 0607 02/10/24 0527 02/11/24 0655  HGB 9.3* 9.2* 9.9*  HCT 29.8* 28.6* 31.1*  PLT 248 238 311  APTT 61* 71* 44*  CREATININE 0.81 0.75  --     Estimated Creatinine Clearance: 117.6 mL/min (by C-G formula based on SCr of 0.75 mg/dL).   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 He presented to the ED on 01/30/24 with c/o n/v and abdominal pain. He was subsequently found to have SBO and underwent exp lap with small bowel resection, repair of small bowel serosa and lysis of adhesions on 02/01/24. Heparin drip started on admission for internal jugular thrombus and d/ced on 02/01/24 for surgery. Pharmacy has been consulted on 02/04/24 to resume heparin drip back with no bolus. Transition heparin to argatroban on 02/07/2024 given ongoing subtherapeutic heparin levels.   Today, 02/11/2024: aPTT 44 - This AM, subtherapeutic on argatroban 1  mcg/kg/min (4.22 mL/hr) However, on inspection, RN states drip had been turned off at some point over night. Pt unsure as to when CBC: Hg 9.9- low/stable, PLTs WNL No bleeding reported.   Goal of Therapy:  aPTT 50-90 sec Monitor platelets by anticoagulation protocol: Yes   Plan:  - Continue infusion rate of argatroban IV at 1 mcg/kg/min as this rate has been therapeutic for several days  - aPTT in 4 hours after rate change  - Monitor daily CBC & aPTT along with sign/symptoms of bleeding     Adalberto Cole, PharmD, BCPS 02/11/2024 9:40 AM

## 2024-02-11 NOTE — Progress Notes (Signed)
 Mobility Specialist - Progress Note   02/11/24 1322  Mobility  Activity Ambulated independently in hallway  Level of Assistance Standby assist, set-up cues, supervision of patient - no hands on  Assistive Device None  Distance Ambulated (ft) 450 ft  Range of Motion/Exercises Active  Activity Response Tolerated well  Mobility Referral Yes  Mobility visit 1 Mobility  Mobility Specialist Start Time (ACUTE ONLY) 1310  Mobility Specialist Stop Time (ACUTE ONLY) 1322  Mobility Specialist Time Calculation (min) (ACUTE ONLY) 12 min   Pt was found in bed and agreeable to ambulate. No complaints with session. At EOS returned to bed with all needs met. Call bell in reach and family in room.  Billey Chang Mobility Specialist

## 2024-02-12 DIAGNOSIS — K56609 Unspecified intestinal obstruction, unspecified as to partial versus complete obstruction: Secondary | ICD-10-CM | POA: Diagnosis not present

## 2024-02-12 DIAGNOSIS — I82C11 Acute embolism and thrombosis of right internal jugular vein: Secondary | ICD-10-CM

## 2024-02-12 DIAGNOSIS — C090 Malignant neoplasm of tonsillar fossa: Secondary | ICD-10-CM | POA: Diagnosis not present

## 2024-02-12 DIAGNOSIS — N179 Acute kidney failure, unspecified: Secondary | ICD-10-CM | POA: Diagnosis not present

## 2024-02-12 LAB — CBC
HCT: 32.6 % — ABNORMAL LOW (ref 39.0–52.0)
Hemoglobin: 10.3 g/dL — ABNORMAL LOW (ref 13.0–17.0)
MCH: 30.4 pg (ref 26.0–34.0)
MCHC: 31.6 g/dL (ref 30.0–36.0)
MCV: 96.2 fL (ref 80.0–100.0)
Platelets: 392 10*3/uL (ref 150–400)
RBC: 3.39 MIL/uL — ABNORMAL LOW (ref 4.22–5.81)
RDW: 14 % (ref 11.5–15.5)
WBC: 9.5 10*3/uL (ref 4.0–10.5)
nRBC: 0 % (ref 0.0–0.2)

## 2024-02-12 LAB — GLUCOSE, CAPILLARY
Glucose-Capillary: 102 mg/dL — ABNORMAL HIGH (ref 70–99)
Glucose-Capillary: 108 mg/dL — ABNORMAL HIGH (ref 70–99)
Glucose-Capillary: 110 mg/dL — ABNORMAL HIGH (ref 70–99)
Glucose-Capillary: 144 mg/dL — ABNORMAL HIGH (ref 70–99)
Glucose-Capillary: 164 mg/dL — ABNORMAL HIGH (ref 70–99)
Glucose-Capillary: 168 mg/dL — ABNORMAL HIGH (ref 70–99)

## 2024-02-12 LAB — APTT: aPTT: 55 s — ABNORMAL HIGH (ref 24–36)

## 2024-02-12 LAB — AEROBIC/ANAEROBIC CULTURE W GRAM STAIN (SURGICAL/DEEP WOUND)

## 2024-02-12 MED ORDER — INSULIN ASPART 100 UNIT/ML IJ SOLN
0.0000 [IU] | Freq: Four times a day (QID) | INTRAMUSCULAR | Status: DC
  Administered 2024-02-12 (×2): 1 [IU] via SUBCUTANEOUS

## 2024-02-12 MED ORDER — ACETAMINOPHEN 500 MG PO TABS
1000.0000 mg | ORAL_TABLET | Freq: Four times a day (QID) | ORAL | Status: DC
  Administered 2024-02-13 – 2024-02-14 (×6): 1000 mg via ORAL
  Filled 2024-02-12 (×6): qty 2

## 2024-02-12 NOTE — Progress Notes (Signed)
 11 Days Post-Op  Subjective: CC: States he feels sweaty and clammy this morning, but denies nausea, pain, or bloating.  Continues to tolerate tube feeds without issue and having bowel movements..  Objective: Vital signs in last 24 hours: Temp:  [97.8 F (36.6 C)-98.8 F (37.1 C)] 98.2 F (36.8 C) (03/16 0503) Pulse Rate:  [78-97] 83 (03/16 0503) Resp:  [16-18] 17 (03/16 0503) BP: (113-124)/(71-74) 115/71 (03/16 0503) SpO2:  [99 %-100 %] 100 % (03/16 0503) Last BM Date : 02/10/24  Intake/Output from previous day: 03/15 0701 - 03/16 0700 In: 2380.1 [P.O.:474; I.V.:1762.8; IV Piggyback:138.4] Out: 350 [Urine:300; Drains:50] Intake/Output this shift: Total I/O In: 49.7 [I.V.:49.7] Out: -   PE: Gen:  Alert, NAD, pleasant Abd: Soft, minimally distended.  Tenderness significantly improved. Midline wound dressing change this morning, ongoing fibrinous exudate at the base, but no significant drainage or surrounding cellulitis. G-tube clamped. JP drain output is now frankly purulent, which is a change from saturday when it was more of a cloudy serosanguineous color  Lab Results:  Recent Labs    02/11/24 0655 02/12/24 0307  WBC 11.3* 9.5  HGB 9.9* 10.3*  HCT 31.1* 32.6*  PLT 311 392   BMET Recent Labs    02/10/24 0527  NA 130*  K 4.4  CL 98  CO2 23  GLUCOSE 123*  BUN 15  CREATININE 0.75  CALCIUM 8.3*   PT/INR No results for input(s): "LABPROT", "INR" in the last 72 hours. CMP     Component Value Date/Time   NA 130 (L) 02/10/2024 0527   NA 139 06/17/2023 0936   K 4.4 02/10/2024 0527   CL 98 02/10/2024 0527   CO2 23 02/10/2024 0527   GLUCOSE 123 (H) 02/10/2024 0527   BUN 15 02/10/2024 0527   BUN 9 06/17/2023 0936   CREATININE 0.75 02/10/2024 0527   CREATININE 0.91 12/15/2023 1509   CALCIUM 8.3 (L) 02/10/2024 0527   PROT 5.8 (L) 02/09/2024 0607   PROT 7.0 06/17/2023 0936   ALBUMIN 2.2 (L) 02/09/2024 0607   ALBUMIN 4.4 06/17/2023 0936   AST 23  02/09/2024 0607   AST 26 12/15/2023 1509   ALT 20 02/09/2024 0607   ALT 25 12/15/2023 1509   ALKPHOS 109 02/09/2024 0607   BILITOT 0.7 02/09/2024 0607   BILITOT 0.3 12/15/2023 1509   GFRNONAA >60 02/10/2024 0527   GFRNONAA >60 12/15/2023 1509   GFRAA >60 12/19/2016 0514   Lipase     Component Value Date/Time   LIPASE 27 01/30/2024 1800    Studies/Results: CT SOFT TISSUE NECK W CONTRAST Result Date: 02/10/2024 CLINICAL DATA:  Right IJ thrombus EXAM: CT NECK WITH CONTRAST TECHNIQUE: Multidetector CT imaging of the neck was performed using the standard protocol following the bolus administration of intravenous contrast. RADIATION DOSE REDUCTION: This exam was performed according to the departmental dose-optimization program which includes automated exposure control, adjustment of the mA and/or kV according to patient size and/or use of iterative reconstruction technique. CONTRAST:  75mL OMNIPAQUE IOHEXOL 300 MG/ML  SOLN COMPARISON:  11/18/2023 FINDINGS: Pharynx and larynx: Small retropharyngeal effusion. Normal epiglottis. There is mild soft tissue asymmetry in the left vallecula. No discrete mass visualized. Salivary glands: No inflammation, mass, or stone. Thyroid: Normal. Lymph nodes: None enlarged or abnormal density. 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 Limited intracranial: Negative. Visualized orbits: Negative. Mastoids and visualized paranasal sinuses: Clear. Skeleton: No acute or aggressive process. Upper chest: Negative. Other: None. IMPRESSION: 1.  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 2. Small retropharyngeal effusion, likely reactive. 3. Mild soft tissue asymmetry in the left vallecula without discrete mass visualized. Direct visualization is recommended. Electronically Signed   By: Deatra Robinson M.D.   On: 02/10/2024 19:35    Anti-infectives: Anti-infectives (From admission, onward)    Start     Dose/Rate Route Frequency  Ordered Stop   02/07/24 1615  piperacillin-tazobactam (ZOSYN) IVPB 3.375 g        3.375 g 12.5 mL/hr over 240 Minutes Intravenous Every 8 hours 02/07/24 1522          Assessment/Plan POD 11 s/p ex lap, SBR, repair of small bowel serosa x 1, LOA x 100 minutes for SBO by Dr. Cliffton Asters on 02/01/24 -Developed intra-abdominal abscesses s/p IR drain 3/13-monitor output, this is more frankly purulent today than it has been previously.  He is afebrile, white count is normal, abdominal exam overall reassuring. - Cont abx. Drain cx pending.  - G-tube clamped > start TF's.  Full liquid diet as desired for comfort -weaning off TPN -multimodal pain control - Midline wound opened 3/12.  Fibrinous base.  BID WTD-I changed his dressing this morning - Ambulate, PT - Pulm toilet  FEN -tube feeds via G-tube, FLD for comfort, wean off TPN, IVF per primary. If any worsening abdominal pain, distension, n/v make npo, place g-tube to gravity and let our team know.  VTE - SCDs, argatroban gtt ID - Zosyn. Afebrile.  Foley - None, spont void.   Hx Tonsillar cancer (stage I), s/p chemo x 4 cycles and XRT Hx FAP, s/p TAC with J pouch 2016 Hx Internal jugular VTE associated with port    LOS: 12 days    Berna Bue, MD [facility] Surgery 02/12/2024, 8:39 AM Please see Amion for pager number during day hours 7:00am-4:30pm

## 2024-02-12 NOTE — Progress Notes (Signed)
 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  He underwent ex lap with small bowel resection, repair of small bowel serosa, and lysis of adhesions with general surgery on 02/01/2024.  Interval History:  Mostly complaining of some right flank pains; holding pressure on it seems to help.  Relieved with bowel movements but then builds back up again.  Assessment and Plan:  High Grade Small Bowel Obstruction S/p Total Colectomy for FAP in 2016 S/p PEG Appreciate general surgery's assistance - s/p ex lap with small bowel resection, repair of small bowel serosa x1, lysis of adhesions on 3/5 -Continue argatroban; will discuss timing of transition back to [name] with surgery (he was actually on Lovenox at home prior to admission for affordability)  -G-tube clamped and patient tolerating - Tolerated CLD and has been advanced to full liquids; Osmolite resumed per surgery as well and TPN now weaned off as of 3/15 - CT performed 3/11 showing fluid collection; IR performed aspiration on 02/09/2024, 100 cc purulent fluid - s/p G-tube exchange on 3/12 - complaining of some right flank pains; BS still reduced; might need some laxatives, but will defer to surgery    Right internal jugular thrombus  - was unable to afford oral AC, but tells me he was actually on what sounds like Lovenox shots (but was  out for about 1 month he says prior to admission - repeat CT neck obtained 3/14 showing persistent right internal jugular thrombus - suspect will still need anticoagulation at discharge; sounds like Lovenox is the affordable med for him; will discuss with Dr. [name] on Monday as well   Fever -Suspected from underlying abdominal fluid collection at this time but also starting to wonder if component of fever possibly in setting of malignancy and/or even persistent thrombus in RIJ.  Also some risk for atelectasis given prolonged hospitalization and bedbound.  - Blood cultures from 02/06/2024 remain negative - UA not concerning for UTI -Continue Zosyn; WBC has normalized as of 3/16  - abdominal fluid culture; few E. Coli (pansensitive)   Acute Kidney Injury-resolved Baseline creatinine < 1.0   Sinus Tachycardia Mild, follow - due to above Will ctm on tele   Dysuria UA not concerning for UTI   Normocytic Anemia - stable in 9-10 g/dL range    Malignant Neoplasm of Tonsillar Fossa Referred to oncology in Oct 2024 - treated with chemoradiation (received 4 doses of weekly cisplatin, rest held due to neutropenia and patient preference). Following with Dr. [name] from oncology   T wave inversion Leads II, III, aVF and V5, V6 Negative troponin - hold off on echo at this time   Asthma Not in exacerbation   Hyperglycemia Follow A1c (5.1)    Old records reviewed in assessment of this patient  Antimicrobials: Zosyn 02/07/2024 >> current  DVT prophylaxis:  Place and maintain sequential compression device Start: 02/09/24 1213Argatroban   Code Status:  Code Status: Full Code  Mobility Assessment (Last 72 Hours)     Mobility Assessment     Row Name 02/11/24 2000 02/11/24 0815 02/10/24 1945 02/10/24 1357 02/10/24 0948   Does patient have an order for bedrest or is patient medically unstable No - Continue assessment No - Continue assessment No - Continue assessment -- No - Continue  assessment   What is the highest level of mobility based on the progressive mobility assessment? Level 6 (Walks independently in room and hall) - Balance while walking in room without assist - Complete Level 6 (Walks independently in room and hall) - Balance while walking in room without assist - Complete Level 6 (Walks independently in room and hall) - Balance while walking in room without assist - Complete Level 6 (Walks independently in room and hall) - Balance while walking in room without assist - Complete Level 6 (Walks independently in room and hall) - Balance while walking in room without assist - Complete    Row Name 02/09/24 2100           Does patient have an order for bedrest or is patient medically unstable No - Continue assessment       What is the highest level of mobility based on the progressive mobility assessment? Level 5 (Walks with assist in room/hall) - Balance while stepping forward/back and can walk in room with assist - Complete                Barriers to discharge: none Disposition Plan:  Home  HH orders placed:  Status is: Inpt  Objective: Blood pressure 115/71, pulse 83, temperature 98.2 F (36.8 C), temperature source Oral, resp. rate 17, height 5\' 8"  (1.727 m), weight 69.5 kg, SpO2 100%.  Examination:  Physical Exam Constitutional:      General: He is not in acute distress.    Appearance: Normal appearance.  HENT:     Head: Normocephalic and atraumatic.     Mouth/Throat:     Mouth: Mucous membranes are moist.  Eyes:     Extraocular Movements: Extraocular movements intact.  Cardiovascular:     Rate and Rhythm: Normal rate and regular rhythm.  Pulmonary:     Effort: Pulmonary effort is normal. No respiratory distress.     Breath sounds: Normal breath sounds. No wheezing.  Abdominal:     General: Bowel sounds are normal. There is distension (very mild firmness).     Tenderness: There is generalized abdominal tenderness.  Musculoskeletal:         General: Normal range of motion.     Cervical back: Normal range of motion and neck supple.  Skin:    General: Skin is warm and dry.  Neurological:     General: No focal deficit present.     Mental Status: He is alert.  Psychiatric:        Mood and Affect: Mood normal.        Behavior: Behavior normal.      Consultants:  General surgery   Procedures:  02/01/24 PROCEDURE:  Exploratory laparotomy with small bowel resection Repair of small bowel serosa x 1 Lysis of adhesions x 100 minutes  Data Reviewed: Results for orders placed or performed during the hospital encounter of 01/30/24 (from the past 24 hours)  APTT     Status: Abnormal   Collection Time: 02/11/24  2:23 PM  Result Value Ref Range   aPTT 57 (H) 24 - 36 seconds  Glucose, capillary  Status: None   Collection Time: 02/11/24  5:43 PM  Result Value Ref Range   Glucose-Capillary 90 70 - 99 mg/dL  APTT     Status: Abnormal   Collection Time: 02/12/24  3:07 AM  Result Value Ref Range   aPTT 55 (H) 24 - 36 seconds  CBC     Status: Abnormal   Collection Time: 02/12/24  3:07 AM  Result Value Ref Range   WBC 9.5 4.0 - 10.5 K/uL   RBC 3.39 (L) 4.22 - 5.81 MIL/uL   Hemoglobin 10.3 (L) 13.0 - 17.0 g/dL   HCT 13.2 (L) 44.0 - 10.2 %   MCV 96.2 80.0 - 100.0 fL   MCH 30.4 26.0 - 34.0 pg   MCHC 31.6 30.0 - 36.0 g/dL   RDW 72.5 36.6 - 44.0 %   Platelets 392 150 - 400 K/uL   nRBC 0.0 0.0 - 0.2 %  Glucose, capillary     Status: Abnormal   Collection Time: 02/12/24  5:29 AM  Result Value Ref Range   Glucose-Capillary 108 (H) 70 - 99 mg/dL  Glucose, capillary     Status: Abnormal   Collection Time: 02/12/24  8:10 AM  Result Value Ref Range   Glucose-Capillary 102 (H) 70 - 99 mg/dL    I have reviewed pertinent nursing notes, vitals, labs, and images as necessary. I have ordered labwork to follow up on as indicated.  I have reviewed the last notes from staff over past 24 hours. I have discussed patient's care plan and  test results with nursing staff, CM/SW, and other staff as appropriate.  Time spent: Greater than 50% of the 55 minute visit was spent in counseling/coordination of care for the patient as laid out in the A&P.   LOS: 12 days   Lewie Chamber, MD [facility] 02/12/2024, 11:51 AM

## 2024-02-12 NOTE — Progress Notes (Signed)
 PHARMACY - ANTICOAGULATION CONSULT NOTE  Pharmacy Consult for argatroban Indication: hx right internal jugular (IJ) vein thromboembolism  No Known Allergies  Patient Measurements: Height: 5\' 8"  (172.7 cm) Weight: 69.5 kg (153 lb 3.5 oz) IBW/kg (Calculated) : 68.4 Heparin Dosing Weight: 70 kg  Vital Signs: Temp: 98.2 F (36.8 C) (03/16 0503) Temp Source: Oral (03/16 0503) BP: 115/71 (03/16 0503) Pulse Rate: 83 (03/16 0503)  Labs: Recent Labs    02/10/24 0527 02/11/24 0655 02/11/24 1423 02/12/24 0307  HGB 9.2* 9.9*  --  10.3*  HCT 28.6* 31.1*  --  32.6*  PLT 238 311  --  392  APTT 71* 44* 57* 55*  CREATININE 0.75  --   --   --     Estimated Creatinine Clearance: 117.6 mL/min (by C-G formula based on SCr of 0.75 mg/dL).   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 He presented to the ED on 01/30/24 with c/o n/v and abdominal pain. He was subsequently found to have SBO and underwent exp lap with small bowel resection, repair of small bowel serosa and lysis of adhesions on 02/01/24. Heparin drip started on admission for internal jugular thrombus and d/ced on 02/01/24 for surgery. Pharmacy has been consulted on 02/04/24 to resume heparin drip back with no bolus. Transition heparin to argatroban on 02/07/2024 given ongoing subtherapeutic heparin levels.    Today, 02/12/2024: aPTT 55 sec - therapeutic on argatroban 1 mcg/kg/min (4.22 mL/hr) CBC: Hg 10.3- low/stable, PLTs WNL No bleeding reported.   Goal of Therapy:  aPTT 50-90 sec Monitor platelets by anticoagulation protocol: Yes   Plan:  - Continue infusion rate of argatroban IV at 1 mcg/kg/min  - Monitor daily CBC & aPTT along with sign/symptoms of bleeding    Terrilee Files, PharmD 02/12/2024 6:41 AM

## 2024-02-12 NOTE — Progress Notes (Signed)
 Mobility Specialist - Progress Note   02/12/24 1112  Mobility  Activity Ambulated independently in hallway  Level of Assistance Independent  Assistive Device None  Distance Ambulated (ft) 450 ft  Range of Motion/Exercises Active  Activity Response Tolerated well  Mobility Referral Yes  Mobility visit 1 Mobility  Mobility Specialist Start Time (ACUTE ONLY) 1100  Mobility Specialist Stop Time (ACUTE ONLY) 1113  Mobility Specialist Time Calculation (min) (ACUTE ONLY) 13 min   Pt was found in bed and agreeable to ambulate. No complaints with session. At EOS returned to bed with all needs met. Call bell in reach.  Billey Chang Mobility Specialist

## 2024-02-12 NOTE — Plan of Care (Signed)
  Problem: Education: Goal: Knowledge of General Education information will improve Description: Including pain rating scale, medication(s)/side effects and non-pharmacologic comfort measures Outcome: Progressing   Problem: Health Behavior/Discharge Planning: Goal: Ability to manage health-related needs will improve Outcome: Progressing   Problem: Clinical Measurements: Goal: Respiratory complications will improve Outcome: Progressing   Problem: Nutrition: Goal: Adequate nutrition will be maintained Outcome: Progressing   Problem: Elimination: Goal: Will not experience complications related to bowel motility Outcome: Progressing Goal: Will not experience complications related to urinary retention Outcome: Progressing   Problem: Pain Managment: Goal: General experience of comfort will improve and/or be controlled Outcome: Progressing   Problem: Safety: Goal: Ability to remain free from injury will improve Outcome: Progressing   Problem: Skin Integrity: Goal: Risk for impaired skin integrity will decrease Outcome: Progressing

## 2024-02-13 ENCOUNTER — Other Ambulatory Visit (HOSPITAL_COMMUNITY): Payer: Self-pay

## 2024-02-13 ENCOUNTER — Encounter: Payer: Self-pay | Admitting: Oncology

## 2024-02-13 ENCOUNTER — Telehealth (HOSPITAL_COMMUNITY): Payer: Self-pay | Admitting: Pharmacy Technician

## 2024-02-13 DIAGNOSIS — N179 Acute kidney failure, unspecified: Secondary | ICD-10-CM | POA: Diagnosis not present

## 2024-02-13 DIAGNOSIS — K56609 Unspecified intestinal obstruction, unspecified as to partial versus complete obstruction: Secondary | ICD-10-CM | POA: Diagnosis not present

## 2024-02-13 DIAGNOSIS — I82C11 Acute embolism and thrombosis of right internal jugular vein: Secondary | ICD-10-CM | POA: Diagnosis not present

## 2024-02-13 LAB — BASIC METABOLIC PANEL
Anion gap: 10 (ref 5–15)
BUN: 16 mg/dL (ref 6–20)
CO2: 23 mmol/L (ref 22–32)
Calcium: 8.8 mg/dL — ABNORMAL LOW (ref 8.9–10.3)
Chloride: 101 mmol/L (ref 98–111)
Creatinine, Ser: 0.79 mg/dL (ref 0.61–1.24)
GFR, Estimated: >60 mL/min (ref >60)
Glucose, Bld: 103 mg/dL — ABNORMAL HIGH (ref 70–99)
Potassium: 4.4 mmol/L (ref 3.5–5.1)
Sodium: 134 mmol/L — ABNORMAL LOW (ref 135–145)

## 2024-02-13 LAB — CBC
HCT: 34.1 % — ABNORMAL LOW (ref 39.0–52.0)
Hemoglobin: 10.8 g/dL — ABNORMAL LOW (ref 13.0–17.0)
MCH: 30.2 pg (ref 26.0–34.0)
MCHC: 31.7 g/dL (ref 30.0–36.0)
MCV: 95.3 fL (ref 80.0–100.0)
Platelets: 489 10*3/uL — ABNORMAL HIGH (ref 150–400)
RBC: 3.58 MIL/uL — ABNORMAL LOW (ref 4.22–5.81)
RDW: 13.5 % (ref 11.5–15.5)
WBC: 8.9 10*3/uL (ref 4.0–10.5)
nRBC: 0 % (ref 0.0–0.2)

## 2024-02-13 LAB — GLUCOSE, CAPILLARY
Glucose-Capillary: 111 mg/dL — ABNORMAL HIGH (ref 70–99)
Glucose-Capillary: 133 mg/dL — ABNORMAL HIGH (ref 70–99)
Glucose-Capillary: 82 mg/dL (ref 70–99)
Glucose-Capillary: 88 mg/dL (ref 70–99)

## 2024-02-13 LAB — MAGNESIUM: Magnesium: 2.1 mg/dL (ref 1.7–2.4)

## 2024-02-13 LAB — APTT: aPTT: 65 s — ABNORMAL HIGH (ref 24–36)

## 2024-02-13 MED ORDER — APIXABAN 5 MG PO TABS
5.0000 mg | ORAL_TABLET | Freq: Two times a day (BID) | ORAL | 3 refills | Status: DC
  Filled 2024-02-13: qty 60, 30d supply, fill #0
  Filled 2024-04-14 – 2024-05-01 (×2): qty 60, 30d supply, fill #1

## 2024-02-13 MED ORDER — OXYCODONE HCL 5 MG/5ML PO SOLN
5.0000 mg | ORAL | Status: DC | PRN
  Administered 2024-02-13 – 2024-02-14 (×2): 10 mg
  Filled 2024-02-13 (×2): qty 10

## 2024-02-13 MED ORDER — AMOXICILLIN-POT CLAVULANATE 875-125 MG PO TABS
1.0000 | ORAL_TABLET | Freq: Two times a day (BID) | ORAL | Status: DC
Start: 2024-02-13 — End: 2024-02-14
  Administered 2024-02-13 – 2024-02-14 (×3): 1 via ORAL
  Filled 2024-02-13 (×3): qty 1

## 2024-02-13 MED ORDER — DIPHENHYDRAMINE HCL 50 MG/ML IJ SOLN
12.5000 mg | Freq: Four times a day (QID) | INTRAMUSCULAR | Status: DC | PRN
Start: 2024-02-13 — End: 2024-02-14

## 2024-02-13 MED ORDER — DIPHENHYDRAMINE HCL 12.5 MG/5ML PO ELIX: 12.5000 mg | ORAL_SOLUTION | Freq: Four times a day (QID) | ORAL | Status: DC | PRN

## 2024-02-13 MED ORDER — AMOXICILLIN-POT CLAVULANATE 875-125 MG PO TABS
1.0000 | ORAL_TABLET | Freq: Two times a day (BID) | ORAL | 0 refills | Status: AC
  Filled 2024-02-13: qty 4, 2d supply, fill #0

## 2024-02-13 MED ORDER — APIXABAN 5 MG PO TABS
5.0000 mg | ORAL_TABLET | Freq: Two times a day (BID) | ORAL | Status: DC
  Administered 2024-02-13 – 2024-02-14 (×3): 5 mg via ORAL
  Filled 2024-02-13 (×3): qty 1

## 2024-02-13 NOTE — Plan of Care (Signed)

## 2024-02-13 NOTE — Progress Notes (Signed)
 PHARMACY - ANTICOAGULATION CONSULT NOTE  Pharmacy Consult for apixaban Indication: hx right internal jugular (IJ) vein thromboembolism  No Known Allergies  Patient Measurements: Height: 5\' 8"  (172.7 cm) Weight: 69.5 kg (153 lb 3.5 oz) IBW/kg (Calculated) : 68.4 Heparin Dosing Weight: 70 kg  Vital Signs: Temp: 98.4 F (36.9 C) (03/17 0510) Temp Source: Oral (03/17 0510) BP: 112/73 (03/17 0510) Pulse Rate: 87 (03/17 0510)  Labs: Recent Labs    02/11/24 0655 02/11/24 1423 02/12/24 0307 02/13/24 0248  HGB 9.9*  --  10.3* 10.8*  HCT 31.1*  --  32.6* 34.1*  PLT 311  --  392 489*  APTT 44* 57* 55* 65*  CREATININE  --   --   --  0.79    Estimated Creatinine Clearance: 117.6 mL/min (by C-G formula based on SCr of 0.79 mg/dL).   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 He presented to the ED on 01/30/24 with c/o n/v and abdominal pain. He was subsequently found to have SBO and underwent exp lap with small bowel resection, repair of small bowel serosa and lysis of adhesions on 02/01/24. Heparin drip started on admission for internal jugular thrombus and d/ced on 02/01/24 for surgery. Pharmacy has been consulted on 02/04/24 to resume heparin drip back with no bolus. Transition heparin to argatroban on 02/07/2024 given ongoing subtherapeutic heparin levels.    Today, 02/13/2024: aPTT 65 sec - therapeutic on argatroban 1 mcg/kg/min (4.22 mL/hr) CBC: Hg 10.8- low/stable, PLTs WNL No bleeding reported.  Plan to transition to apixaban today  Scr 0.79 mg/dl Per discussion with Dr. Frederick Peers no need for loading dose as pt has been essentially therapeutic on argatroban since 3/11   Goal of Therapy:  aPTT 50-90 sec Monitor platelets by anticoagulation protocol: Yes   Plan:  Stop argatroban and start apixaban 5 mg PO BID  Monitor daily CBC &  SCr along with sign/symptoms of bleeding    Adalberto Cole, PharmD, BCPS 02/13/2024 1:27 PM

## 2024-02-13 NOTE — Progress Notes (Signed)
 Progress Note    Harry Williams   [id number]:[id number]  DOB: 06/18/1981  DOA: 01/30/2024     13 PCP: [name], MD  Initial CC: abdominal pain  Hospital Course: Mr. 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  He underwent ex lap with small bowel resection, repair of small bowel serosa, and lysis of adhesions with general surgery on 02/01/2024.  Interval History:  Notes overnight.  Right flank pain improved since yesterday.  Overall continues to progress well.  Continues to have bowel movements and no nausea/vomiting.  Tolerates oral liquids as he has been prior to admission and Osmolite has been resumed. Discussed anticoagulation options with him and his wife today as well.  They are electing for Eliquis and state affordability after pharmacy ran price checks; also discussed with Dr. [name].   Assessment and Plan:  High Grade Small Bowel Obstruction S/p Total Colectomy for FAP in 2016 S/p PEG Appreciate general surgery's assistance - s/p ex lap with small bowel resection, repair of small bowel serosa x1, lysis of adhesions on 3/5 -Continue Osmolite per home regimen -Tolerating oral liquids - CT performed 3/11 showing fluid collection; IR performed aspiration on 02/09/2024, 100 cc purulent fluid - s/p G-tube exchange on 3/12 - outpt follow up with drain clinic at discharge    Right internal jugular thrombus  - not on [facility] for approx 1 month prior to admission (couldn't afford Eliquis and ran out of Lovenox before they could follow up again) -  repeat CT neck obtained 3/14 showing persistent right internal jugular thrombus - pharmacy ran price check on Eliquis and Xarelto, both around 170/mth.  Discussed with patient, his wife, and oncology.  Patient has elected for Eliquis at discharge.  Updated Dr. [name] as well - no need to load Eliquis since therapeutic on Argatroban for 1 week  Fever -Suspected from underlying abdominal fluid collection at this time but also starting to wonder if component of fever possibly in setting of malignancy and/or even persistent thrombus in RIJ.  Also some risk for atelectasis given prolonged hospitalization and bedbound.  - Blood cultures from 02/06/2024 remain negative - UA not concerning for UTI -Continue Zosyn; WBC has normalized as of 3/16  - abdominal fluid culture; few E. 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 Following with Dr. [name] from oncology   T wave inversion Leads II, III, aVF and V5, V6 Negative troponin - hold off on echo at this time   Asthma Not in exacerbation   Hyperglycemia Follow A1c (5.1)    Old records reviewed in assessment of this patient  Antimicrobials: Zosyn 02/07/2024 >> 3/17 Augmentin 3/17 >> current  DVT prophylaxis:  Place and maintain sequential compression device Start: 02/09/24 1213 apixaban (ELIQUIS) tablet 5 mg   Code Status:   Code Status: Full Code  Mobility Assessment (Last 72 Hours)     Mobility Assessment     Row Name 02/12/24 2100 02/12/24 0926 02/11/24 2000 02/11/24 0815 02/10/24  1945   Does patient have an order for bedrest or is patient medically unstable No - Continue assessment No - Continue assessment No - Continue assessment No - Continue assessment No - Continue assessment   What is the highest level of mobility based on the progressive mobility assessment? Level 6 (Walks independently in room and hall) - Balance while walking in room without assist - Complete Level 6 (Walks independently in room and hall) - Balance while walking in room without assist - Complete Level 6 (Walks independently in room and hall) - Balance while walking in room without assist - Complete Level 6 (Walks independently in room and hall) - Balance while walking in room without assist - Complete Level 6 (Walks independently in room and hall) - Balance while walking in room without assist - Complete            Barriers to discharge: none Disposition Plan:  Home  HH orders placed:  Status is: Inpt  Objective: Blood pressure 112/73, pulse 87, temperature 98.4 F (36.9 C), temperature source Oral, resp. rate 18, height 5\' 8"  (1.727 m), weight 69.5 kg, SpO2 100%.  Examination:  Physical Exam Constitutional:      General: He is not in acute distress.    Appearance: Normal appearance.  HENT:     Head: Normocephalic and atraumatic.     Mouth/Throat:     Mouth: Mucous membranes are moist.  Eyes:     Extraocular Movements: Extraocular movements intact.  Cardiovascular:     Rate and Rhythm: Normal rate and regular rhythm.  Pulmonary:     Effort: Pulmonary effort is normal. No respiratory distress.     Breath sounds: Normal breath sounds. No wheezing.  Abdominal:     General: Bowel sounds are normal. There is distension (very mild firmness).     Tenderness: There is generalized abdominal tenderness.  Musculoskeletal:        General: Normal range of motion.     Cervical back: Normal range of motion and neck supple.  Skin:    General: Skin is warm and dry.  Neurological:      General: No focal deficit present.     Mental Status: He is alert.  Psychiatric:        Mood and Affect: Mood normal.        Behavior: Behavior normal.      Consultants:  General surgery   Procedures:  02/01/24 PROCEDURE:  Exploratory laparotomy with small bowel resection Repair of small bowel serosa x 1 Lysis of adhesions x 100 minutes  02/09/24: IR drain placement fluid collection  Data Reviewed: Results for orders placed or performed during the hospital encounter of 01/30/24 (from the past 24 hours)  Glucose, capillary     Status: Abnormal   Collection Time: 02/12/24  5:46 PM  Result Value Ref Range   Glucose-Capillary 164 (H) 70 - 99 mg/dL  Glucose, capillary     Status: Abnormal   Collection Time: 02/12/24  8:21 PM  Result Value Ref Range   Glucose-Capillary 144 (H) 70 - 99 mg/dL  Glucose, capillary     Status: Abnormal   Collection Time: 02/12/24 11:39 PM  Result  Value Ref Range   Glucose-Capillary 110 (H) 70 - 99 mg/dL  APTT     Status: Abnormal   Collection Time: 02/13/24  2:48 AM  Result Value Ref Range   aPTT 65 (H) 24 - 36 seconds  CBC     Status: Abnormal   Collection Time: 02/13/24  2:48 AM  Result Value Ref Range   WBC 8.9 4.0 - 10.5 K/uL   RBC 3.58 (L) 4.22 - 5.81 MIL/uL   Hemoglobin 10.8 (L) 13.0 - 17.0 g/dL   HCT 25.3 (L) 66.4 - 40.3 %   MCV 95.3 80.0 - 100.0 fL   MCH 30.2 26.0 - 34.0 pg   MCHC 31.7 30.0 - 36.0 g/dL   RDW 47.4 25.9 - 56.3 %   Platelets 489 (H) 150 - 400 K/uL   nRBC 0.0 0.0 - 0.2 %  Basic metabolic panel     Status: Abnormal   Collection Time: 02/13/24  2:48 AM  Result Value Ref Range   Sodium 134 (L) 135 - 145 mmol/L   Potassium 4.4 3.5 - 5.1 mmol/L   Chloride 101 98 - 111 mmol/L   CO2 23 22 - 32 mmol/L   Glucose, Bld 103 (H) 70 - 99 mg/dL   BUN 16 6 - 20 mg/dL   Creatinine, Ser 8.75 0.61 - 1.24 mg/dL   Calcium 8.8 (L) 8.9 - 10.3 mg/dL   GFR, Estimated >64 >33 mL/min   Anion gap 10 5 - 15  Magnesium     Status: None    Collection Time: 02/13/24  2:48 AM  Result Value Ref Range   Magnesium 2.1 1.7 - 2.4 mg/dL  Glucose, capillary     Status: None   Collection Time: 02/13/24  5:53 AM  Result Value Ref Range   Glucose-Capillary 88 70 - 99 mg/dL  Glucose, capillary     Status: None   Collection Time: 02/13/24 11:38 AM  Result Value Ref Range   Glucose-Capillary 82 70 - 99 mg/dL    I have reviewed pertinent nursing notes, vitals, labs, and images as necessary. I have ordered labwork to follow up on as indicated.  I have reviewed the last notes from staff over past 24 hours. I have discussed patient's care plan and test results with nursing staff, CM/SW, and other staff as appropriate.  Time spent: Greater than 50% of the 55 minute visit was spent in counseling/coordination of care for the patient as laid out in the A&P.   LOS: 13 days   Lewie Chamber, MD [facility] 02/13/2024, 3:04 PM

## 2024-02-13 NOTE — Progress Notes (Signed)
 Referring Provider(s): Milford Cage   Supervising Physician: Malachy Moan  Patient Status:  [facility] - In-pt  Chief Complaint:  Post-op abdominal fluid collections - s/p right lower quadrant drain placement 02/09/24 with Dr. Elby Showers   Subjective:  Pt states he is feeling well, no pain from the drain.  All pt questions answered.    Allergies: Patient has no known allergies.  Medications: Prior to Admission medications   Medication Sig Start Date End Date Taking? Authorizing Provider  baclofen (LIORESAL) 10 MG tablet Take 1 tablet (10 mg total) by mouth 3 (three) times daily. Patient taking differently: Take 10 mg by mouth 3 (three) times daily as needed for muscle spasms. 10/04/23  Yes Walisiewicz, Yvonna Alanis E, PA-C  celecoxib (CELEBREX) 200 MG capsule Place 1 capsule (200 mg total) into feeding tube daily as needed for moderate pain (pain score 4-6). 11/29/23  Yes [name], MD  dicyclomine (BENTYL) 20 MG tablet Take 1 tablet (20 mg total) by mouth 2 (two) times daily. Patient taking differently: Take 20 mg by mouth 2 (two) times daily as needed for spasms. 01/26/24  Yes Palumbo, April, MD  HYDROcodone-acetaminophen (HYCET) 7.5-325 mg/15 ml solution Take 10 mLs by mouth every 4 (four) hours as needed for moderate pain (pain score 4-6). 12/13/23  Yes Pasam, [name], MD  lidocaine (XYLOCAINE) 2 % solution Patient: Mix 1part 2% viscous lidocaine, 1part H20. Swish & swallow 10mL of diluted mixture, before meals and at bedtime, up to QID Patient taking differently: Use as directed 15 mLs in the mouth or throat every 6 (six) hours as needed for mouth pain. Mix 1 part 2% viscous lidocaine with 1 part water. Swish & swallow 10 ml's of diluted mixture up to 4 times a day PRN 10/10/23  Yes [name], MD  Nutritional Supplements (FEEDING SUPPLEMENT, OSMOLITE 1.5 CAL,) LIQD 7 cartons Osmolite 1.5 split over four feeding daily. Flush tube with 60 ml water before and after each bolus.  Provide additional 3 1/2 cups (830 ml) water/day to meet hydration needs. Provides 2485 kcal, 104.3 g protein, 1267 ml free water from formula (2577 ml total water with flushes). 1659 ml/day meets 100% RDI 10/18/23  Yes [name], MD  omeprazole (PRILOSEC) 20 MG capsule Take 20 mg by mouth See admin instructions. 20 mg per tube once a day PRN   Yes [provider]  ondansetron (ZOFRAN-ODT) 8 MG disintegrating tablet 8mg  ODT q8hours prn nausea Patient taking differently: Take 8 mg by mouth every 8 (eight) hours as needed for nausea or vomiting. 8mg  ODT q8hours prn nausea 01/26/24  Yes Palumbo, April, MD  apixaban (ELIQUIS) 5 MG TABS tablet Take 1 tablet (5 mg total) by mouth 2 (two) times daily. Patient not taking: Reported on 01/31/2024 12/15/23   Pasam, [name], MD  PARoxetine (PAXIL CR) 12.5 MG 24 hr tablet Take 1 tablet (12.5 mg total) by mouth daily. Patient not taking: Reported on 11/01/2023 08/30/23   Thresa Ross, MD  rivaroxaban (XARELTO) 20 MG TABS tablet Take 1 tablet (20 mg total) by mouth daily with supper. Patient not taking: Reported on 01/31/2024 01/02/24   Pasam, [name], MD  scopolamine (TRANSDERM-SCOP) 1 MG/3DAYS Place 1 patch (1.5 mg total) onto the skin every 3 (three) days. Patient not taking: Reported on 01/31/2024 10/31/23   [name], MD     Vital Signs: BP 112/73 (BP Location: Left Arm)   Pulse 87   Temp 98.4 F (36.9 C) (Oral)   Resp 18  Ht 5\' 8"  (1.727 m)   Wt 153 lb 3.5 oz (69.5 kg)   SpO2 100%   BMI 23.30 kg/m   Physical Exam Constitutional:      Appearance: Normal appearance.  Skin:    Comments: RLQ drain F/A easily, statlock and suture in place Skin clean, dry, without signs of infection  Output 15 cc serous; suction bulb   Neurological:     Mental Status: He is alert and oriented to person, place, and time.  Psychiatric:        Mood and Affect: Mood normal.        Behavior: Behavior normal.      Labs:  CBC: Recent Labs     02/10/24 0527 02/11/24 0655 02/12/24 0307 02/13/24 0248  WBC 9.6 11.3* 9.5 8.9  HGB 9.2* 9.9* 10.3* 10.8*  HCT 28.6* 31.1* 32.6* 34.1*  PLT 238 311 392 489*    COAGS: Recent Labs    09/22/23 0826 01/31/24 0455 02/07/24 1929 02/11/24 0655 02/11/24 1423 02/12/24 0307 02/13/24 0248  INR 0.9 1.1  --   --   --   --   --   APTT  --  25   < > 44* 57* 55* 65*   < > = values in this interval not displayed.    BMP: Recent Labs    02/08/24 0038 02/09/24 0607 02/10/24 0527 02/13/24 0248  NA 137 136 130* 134*  K 3.7 4.4 4.4 4.4  CL 105 104 98 101  CO2 21* 21* 23 23  GLUCOSE 110* 129* 123* 103*  BUN 16 16 15 16   CALCIUM 8.2* 8.4* 8.3* 8.8*  CREATININE 0.71 0.81 0.75 0.79  GFRNONAA >60 >60 >60 >60    LIVER FUNCTION TESTS: Recent Labs    02/04/24 0728 02/05/24 0112 02/06/24 0457 02/09/24 0607  BILITOT 0.9 0.8 0.4 0.7  AST 12* 9* 10* 23  ALT 8 8 8 20   ALKPHOS 53 52 59 109  PROT 5.4* 5.5* 5.5* 5.8*  ALBUMIN 2.4* 2.3* 2.2* 2.2*    Assessment and Plan:  Pt with post-op abdominal fluid collections s/p right lower quadrant drain placement 02/09/24 with Dr. Elby Showers.  Drain teaching performed, all pt questions answered.    Drain Location: RLQ Size: Fr size: 10 Fr Date of placement: 02/09/24  Currently to: Drain collection device: suction bulb 24 hour output:  Output by Drain (mL) 02/11/24 0701 - 02/11/24 1900 02/11/24 1901 - 02/12/24 0700 02/12/24 0701 - 02/12/24 1900 02/12/24 1901 - 02/13/24 0700 02/13/24 0701 - 02/13/24 1040  Closed System Drain 1 RLQ Other (Comment) 10 Fr. 40 10  25   Gastrostomy/Enterostomy Gastrostomy 16 Fr. LUQ         Interval imaging/drain manipulation:  None   Current examination: Flushes/aspirates easily.  Insertion site unremarkable. Suture and stat lock in place. Dressed appropriately.   Plan: Continue TID flushes with 5 cc NS. Record output Q shift. Dressing changes QD or PRN if soiled.  Call IR APP or on call IR MD if  difficulty flushing or sudden change in drain output.  Repeat imaging/possible drain injection once output < 10 mL/QD (excluding flush material). Consideration for drain removal if output is < 10 mL/QD (excluding flush material), pending discussion with the providing surgical service.  Discharge planning: Please contact IR APP or on call IR MD prior to patient d/c to ensure appropriate follow up plans are in place. Typically patient will follow up with IR clinic 10-14 days post d/c for repeat imaging/possible drain  injection. IR scheduler will contact patient with date/time of appointment. Patient will need to flush drain QD with 5 cc NS, record output QD, dressing changes every 2-3 days or earlier if soiled.   IR will continue to follow - please call with questions or concerns.  Electronically Signed: Loman Brooklyn, PA-C 02/13/2024, 10:40 AM    I spent a total of 25 Minutes at the the patient's bedside AND on the patient's hospital floor or unit, greater than 50% of which was counseling/coordinating care for image guided right lower quadrant drain placement.

## 2024-02-13 NOTE — Telephone Encounter (Signed)
 Patient Product/process development scientist completed.    The patient is insured through [facility]. Patient has [employer], may use a copay card, and/or apply for patient assistance if available.    Ran test claim for Eliquis 5 mg and the current 30 day co-pay is $174.44.  Ran test claim for Xarelto 20 mg and the current 30 day co-pay is $172.07  Ran test claim for enoxaparin (Lovenox) 100 mg and the current 30 day co-pay is $15.00  This test claim was processed through [employer]- copay amounts may vary at other pharmacies due to [employer], or as the patient moves through the different stages of their insurance plan.     Roland Earl, CPHT Pharmacy Technician III Certified Patient Advocate [facility] Patient Advocate Team Direct Number: [phone]  Fax: [phone]

## 2024-02-13 NOTE — Progress Notes (Signed)
 PHARMACY - ANTICOAGULATION CONSULT NOTE  Pharmacy Consult for argatroban Indication: hx right internal jugular (IJ) vein thromboembolism  No Known Allergies  Patient Measurements: Height: 5\' 8"  (172.7 cm) Weight: 69.5 kg (153 lb 3.5 oz) IBW/kg (Calculated) : 68.4 Heparin Dosing Weight: 70 kg  Vital Signs: Temp: 98.4 F (36.9 C) (03/17 0510) Temp Source: Oral (03/17 0510) BP: 112/73 (03/17 0510) Pulse Rate: 87 (03/17 0510)  Labs: Recent Labs    02/11/24 0655 02/11/24 1423 02/12/24 0307 02/13/24 0248  HGB 9.9*  --  10.3* 10.8*  HCT 31.1*  --  32.6* 34.1*  PLT 311  --  392 489*  APTT 44* 57* 55* 65*  CREATININE  --   --   --  0.79    Estimated Creatinine Clearance: 117.6 mL/min (by C-G formula based on SCr of 0.79 mg/dL).   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 He presented to the ED on 01/30/24 with c/o n/v and abdominal pain. He was subsequently found to have SBO and underwent exp lap with small bowel resection, repair of small bowel serosa and lysis of adhesions on 02/01/24. Heparin drip started on admission for internal jugular thrombus and d/ced on 02/01/24 for surgery. Pharmacy has been consulted on 02/04/24 to resume heparin drip back with no bolus. Transition heparin to argatroban on 02/07/2024 given ongoing subtherapeutic heparin levels.    Today, 02/13/2024: aPTT 65 sec - therapeutic on argatroban 1 mcg/kg/min (4.22 mL/hr) CBC: Hg 10.8- low/stable, PLTs WNL No bleeding reported.   Goal of Therapy:  aPTT 50-90 sec Monitor platelets by anticoagulation protocol: Yes   Plan:  - Continue infusion rate of argatroban IV at 1 mcg/kg/min  - Monitor daily CBC & aPTT along with sign/symptoms of bleeding  - F/u on transition to enoxaparin     Adalberto Cole, PharmD, BCPS 02/13/2024 9:40 AM

## 2024-02-13 NOTE — Progress Notes (Addendum)
 12 Days Post-Op  Subjective: CC: Abdominal pain improved from when I saw him last. Only having pain around his incision now. Did not require IV pain medication yesterday but did require 1 dose today. Has not receive oxycodone in the last 24 hours  Reports prior to arrival he was on soft diet (eggs etc) for what sounds like comfort and bolus tube feeds 7 times a day. He is tolerating bolus tf's and fld here without increased abdominal pain, n/v. Denies pain with eating fld currently. He is finishing most of his fld trays but only eating 1x/day. Drinking 1 ensure a day as well. Would drink more ensure shakes if they were the flavor he liked and offered more. No n/v. Passing flatus. Liquid bm this am. Mobilizing in the halls. Voiding without issues.   Afebrile. No tachycardia or hypotension. WBC wnl. Cx w/ E. Coli  Objective: Vital signs in last 24 hours: Temp:  [97.7 F (36.5 C)-98.4 F (36.9 C)] 98.4 F (36.9 C) (03/17 0510) Pulse Rate:  [84-88] 87 (03/17 0510) Resp:  [15-18] 18 (03/17 0510) BP: (112-124)/(73-77) 112/73 (03/17 0510) SpO2:  [100 %] 100 % (03/17 0510) Last BM Date : 02/12/24  Intake/Output from previous day: 03/16 0701 - 03/17 0700 In: 350 [I.V.:139.8; IV Piggyback:200.2] Out: 25 [Drains:25] Intake/Output this shift: No intake/output data recorded.  PE: Gen:  Alert, NAD, pleasant Abd: Soft. ND. Appropriately ttp around his incision. Otherwise NT. Midline wound clean with small area on the inferior aspect that is open w/ mixture of granulation and fibrinous tissue at the base as noted below. G-tube clamped. JP drain purulent - 25cc/24 hours.    Lab Results:  Recent Labs    02/12/24 0307 02/13/24 0248  WBC 9.5 8.9  HGB 10.3* 10.8*  HCT 32.6* 34.1*  PLT 392 489*   BMET Recent Labs    02/13/24 0248  NA 134*  K 4.4  CL 101  CO2 23  GLUCOSE 103*  BUN 16  CREATININE 0.79  CALCIUM 8.8*   PT/INR No results for input(s): "LABPROT", "INR" in the last  72 hours. CMP     Component Value Date/Time   NA 134 (L) 02/13/2024 0248   NA 139 06/17/2023 0936   K 4.4 02/13/2024 0248   CL 101 02/13/2024 0248   CO2 23 02/13/2024 0248   GLUCOSE 103 (H) 02/13/2024 0248   BUN 16 02/13/2024 0248   BUN 9 06/17/2023 0936   CREATININE 0.79 02/13/2024 0248   CREATININE 0.91 12/15/2023 1509   CALCIUM 8.8 (L) 02/13/2024 0248   PROT 5.8 (L) 02/09/2024 0607   PROT 7.0 06/17/2023 0936   ALBUMIN 2.2 (L) 02/09/2024 0607   ALBUMIN 4.4 06/17/2023 0936   AST 23 02/09/2024 0607   AST 26 12/15/2023 1509   ALT 20 02/09/2024 0607   ALT 25 12/15/2023 1509   ALKPHOS 109 02/09/2024 0607   BILITOT 0.7 02/09/2024 0607   BILITOT 0.3 12/15/2023 1509   GFRNONAA >60 02/13/2024 0248   GFRNONAA >60 12/15/2023 1509   GFRAA >60 12/19/2016 0514   Lipase     Component Value Date/Time   LIPASE 27 01/30/2024 1800    Studies/Results: No results found.  Anti-infectives: Anti-infectives (From admission, onward)    Start     Dose/Rate Route Frequency Ordered Stop   02/13/24 1400  amoxicillin-clavulanate (AUGMENTIN) 875-125 MG per tablet 1 tablet        1 tablet Oral Every 12 hours 02/13/24 1055     02/07/24  1615  piperacillin-tazobactam (ZOSYN) IVPB 3.375 g  Status:  Discontinued        3.375 g 12.5 mL/hr over 240 Minutes Intravenous Every 8 hours 02/07/24 1522 02/13/24 1055        Assessment/Plan POD 12 s/p ex lap, SBR, repair of small bowel serosa x 1, LOA x 100 minutes for SBO by Dr. Cliffton Asters on 02/01/24 - Developed intra-abdominal abscesses s/p IR drain 3/13 - monitor output (25cc/24 hours). Became frankly purulent over the weekend. He is afebrile, white count is normal, abdominal exam overall reassuring.  - Drain and flushes per IR.  - Cont abx. Drain cx w/ E. Coli. Will discuss w/ attending duration of abx and switching to po abx.  - Bolus TF's per RD. D3 diet and shakes as desired for comfort - TPN off - Wean IV pain meds. Multimodal pain control - Midline  wound opened 3/12.  Clean base.  BID WTD. He reports his wife will help with this at d/c - Ambulate, PT - no f/u recommended - Pulm toilet  Patient appears to be nearing d/c. He is off TPN, back on bolus TF's, tolerating po for comfort and having bowel function. Will adv to D3 diet for options. His drain is purulent but output downtrending and is afebrile w/ normal wbc and reassuring/improved exam. Likely can switch to po abx based on cx and have f/u with IR for repeat CT when drain output tapers off. He reports his wife plans to help w/ wound care at home - will ask RN to teach him this. Cont to wean IV pain medications. Asked RN to utilize PO pain med options first today to ensure his pain will be controlled at d/c. He has been cleared by PT. If continues to progress, possible d/c from our standpoint as early as tomorrow w/ appropriate f/u.   FEN - Bolus tube feeds via G-tube, D3 for comfort, TPN off, IVF per primary. If any worsening abdominal pain, distension, n/v make npo, place g-tube to gravity and let our team know.  VTE - SCDs, argatroban gtt - okay to transition to PO anticoagulation  ID - Zosyn. Afebrile.  Foley - None, spont void.    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

## 2024-02-13 NOTE — Progress Notes (Signed)
 Nutrition Follow-up  INTERVENTION:   -Continue 1 carton (237 ml) Osmolite 1.5 QID via PEG -Flush with 60 ml free water before and after each feeding -Provides 1420 kcals, 59g protein and 1204 ml H2O   -Ensure Plus High Protein po BID, each supplement provides 350 kcal and 20 grams of protein.    NUTRITION DIAGNOSIS:   Increased nutrient needs related to cancer and cancer related treatments as evidenced by estimated needs.  Ongoing.  GOAL:   Patient will meet greater than or equal to 90% of their needs  Meeting   MONITOR:   Labs, Weight trends  ASSESSMENT:   Pt admitted with emesis and abdominal pain secondary to SBO. PMH significant for  malignant neoplasm of the tonsillar fossa s/p chemoradiation, oral mucositis d/t radiation therapy, dysphagia s/p PEG tube, FAP s/p total colectomy (2016), RIJ thrombus.  3/3: admitted 3/5: s/p exlap with small bowel resection, repair of small bowel serosa, lysis of adhesions 3/8: start TPN d/t ongoing ileus 3/12: G-tube exchanged 3/13: Clear liquids 3/14: FLD 3/15: TPN weaned off  Patient in room, reports feeling good today. Ready to try some solid foods, diet advancing to dysphagia 3. Pt reports tolerating tube feeds with no problem has also been drinking at least 1 Ensure daily, willing to drink more. Pt aware of tube feeding recommendations and has no questions for RD at this time.  Admission weight: 155 lbs Last weight 3/14: 153 lbs  Medications reviewed.  Labs reviewed: CBGs: 82-168  Diet Order:   Diet Order             DIET DYS 3 Room service appropriate? Yes; Fluid consistency: Thin  Diet effective now                   EDUCATION NEEDS:   No education needs have been identified at this time  Skin:  Skin Assessment: Skin Integrity Issues: Skin Integrity Issues:: Incisions Incisions: closed abdominal incision  Last BM:  3/16  Height:   Ht Readings from Last 1 Encounters:  01/30/24 5\' 8"  (1.727 m)     Weight:   Wt Readings from Last 1 Encounters:  02/10/24 69.5 kg    BMI:  Body mass index is 23.3 kg/m.  Estimated Nutritional Needs:   Kcal:  2300-2500  Protein:  115-130g  Fluid:  >/=2L  Tilda Franco, MS, RD, LDN Inpatient Clinical Dietitian Contact via Secure chat

## 2024-02-14 ENCOUNTER — Other Ambulatory Visit (HOSPITAL_COMMUNITY): Payer: Self-pay

## 2024-02-14 DIAGNOSIS — N179 Acute kidney failure, unspecified: Secondary | ICD-10-CM | POA: Diagnosis not present

## 2024-02-14 DIAGNOSIS — C090 Malignant neoplasm of tonsillar fossa: Secondary | ICD-10-CM | POA: Diagnosis not present

## 2024-02-14 DIAGNOSIS — K56609 Unspecified intestinal obstruction, unspecified as to partial versus complete obstruction: Secondary | ICD-10-CM | POA: Diagnosis not present

## 2024-02-14 DIAGNOSIS — I82C11 Acute embolism and thrombosis of right internal jugular vein: Secondary | ICD-10-CM | POA: Diagnosis not present

## 2024-02-14 LAB — CBC
HCT: 32.8 % — ABNORMAL LOW (ref 39.0–52.0)
Hemoglobin: 10.5 g/dL — ABNORMAL LOW (ref 13.0–17.0)
MCH: 30.2 pg (ref 26.0–34.0)
MCHC: 32 g/dL (ref 30.0–36.0)
MCV: 94.3 fL (ref 80.0–100.0)
Platelets: 466 10*3/uL — ABNORMAL HIGH (ref 150–400)
RBC: 3.48 MIL/uL — ABNORMAL LOW (ref 4.22–5.81)
RDW: 13.4 % (ref 11.5–15.5)
WBC: 10.1 10*3/uL (ref 4.0–10.5)
nRBC: 0 % (ref 0.0–0.2)

## 2024-02-14 LAB — GLUCOSE, CAPILLARY
Glucose-Capillary: 101 mg/dL — ABNORMAL HIGH (ref 70–99)
Glucose-Capillary: 136 mg/dL — ABNORMAL HIGH (ref 70–99)

## 2024-02-14 MED ORDER — OXYCODONE HCL 5 MG/5ML PO SOLN
5.0000 mg | Freq: Four times a day (QID) | ORAL | 0 refills | Status: DC | PRN
  Filled 2024-02-14: qty 200, 5d supply, fill #0

## 2024-02-14 MED ORDER — SODIUM CHLORIDE 0.9% FLUSH
10.0000 mL | Freq: Every day | INTRAVENOUS | Status: DC
  Administered 2024-02-14: 10 mL

## 2024-02-14 MED ORDER — METHOCARBAMOL 500 MG PO TABS
1000.0000 mg | ORAL_TABLET | Freq: Four times a day (QID) | ORAL | 0 refills | Status: DC | PRN
  Filled 2024-02-14: qty 60, 8d supply, fill #0

## 2024-02-14 MED ORDER — SODIUM CHLORIDE 0.9% FLUSH
10.0000 mL | Freq: Every day | INTRAVENOUS | 0 refills | Status: DC
  Filled 2024-02-14: qty 140, 14d supply, fill #0

## 2024-02-14 MED ORDER — DIPHENHYDRAMINE HCL 12.5 MG/5ML PO ELIX: 12.5000 mg | ORAL_SOLUTION | Freq: Four times a day (QID) | ORAL | Status: DC | PRN

## 2024-02-14 MED ORDER — OXYCODONE HCL 5 MG/5ML PO SOLN: 5.0000 mg | ORAL | Status: DC | PRN

## 2024-02-14 MED ORDER — DIPHENHYDRAMINE HCL 50 MG/ML IJ SOLN: 12.5000 mg | Freq: Four times a day (QID) | INTRAMUSCULAR | Status: DC | PRN

## 2024-02-14 MED ORDER — HEPARIN SOD (PORK) LOCK FLUSH 100 UNIT/ML IV SOLN
500.0000 [IU] | INTRAVENOUS | Status: AC | PRN
  Administered 2024-02-14: 500 [IU]

## 2024-02-14 NOTE — Progress Notes (Signed)
 PHARMACIST - PHYSICIAN COMMUNICATION  DR:   Lewie Chamber  CONCERNING: IV to Oral Route Change Policy  RECOMMENDATION: This patient is receiving diphenhydramine by the intravenous route.  Based on criteria approved by the Pharmacy and Therapeutics Committee, the intravenous medication(s) is/are being converted to the equivalent oral dose form(s).   DESCRIPTION: These criteria include: The patient is eating (either orally or via tube) and/or has been taking other orally administered medications for a least 24 hours The patient has no evidence of active gastrointestinal bleeding or impaired GI absorption (gastrectomy, short bowel, patient on TNA or NPO).  If you have questions about this conversion, please contact the Pharmacy Department  []   [phone] )  [facility] []   [phone] )  [facility] []   [phone] )  [facility] []   [phone] )  [facility] [x]   [phone] )  [facility]   Thank you for allowing pharmacy to be a part of this patient's care.   Lyn Henri, [facility] PharmD Candidate 02/14/2024 12:29 PM

## 2024-02-14 NOTE — Progress Notes (Signed)
 13 Days Post-Op  Subjective: No new complaints.  Throat pain is improved with magic mouthwash.  Tolerating TFs with no issues and intermittent diet.  Drain doing well.  Objective: Vital signs in last 24 hours: Temp:  [97.6 F (36.4 C)-98.3 F (36.8 C)] 97.6 F (36.4 C) (03/18 0500) Pulse Rate:  [72-84] 75 (03/18 0500) Resp:  [16-18] 18 (03/18 0500) BP: (107-114)/(64-73) 114/64 (03/18 0500) SpO2:  [99 %-100 %] 99 % (03/18 0500) Weight:  [65.3 kg] 65.3 kg (03/18 0500) Last BM Date : 02/13/24  Intake/Output from previous day: 03/17 0701 - 03/18 0700 In: 370 [P.O.:360] Out: 20 [Drains:20] Intake/Output this shift: No intake/output data recorded.  PE: Gen:  Alert, NAD, pleasant Abd: Soft. ND. Appropriately ttp around his incision. Otherwise NT. Midline wound clean with small area on the inferior aspect that is open w/ mixture of granulation and fibrinous tissue at the base as noted below. G-tube clamped. JP drain serocloudy - 20cc/24 hours.    Lab Results:  Recent Labs    02/13/24 0248 02/14/24 0350  WBC 8.9 10.1  HGB 10.8* 10.5*  HCT 34.1* 32.8*  PLT 489* 466*   BMET Recent Labs    02/13/24 0248  NA 134*  K 4.4  CL 101  CO2 23  GLUCOSE 103*  BUN 16  CREATININE 0.79  CALCIUM 8.8*   PT/INR No results for input(s): "LABPROT", "INR" in the last 72 hours. CMP     Component Value Date/Time   NA 134 (L) 02/13/2024 0248   NA 139 06/17/2023 0936   K 4.4 02/13/2024 0248   CL 101 02/13/2024 0248   CO2 23 02/13/2024 0248   GLUCOSE 103 (H) 02/13/2024 0248   BUN 16 02/13/2024 0248   BUN 9 06/17/2023 0936   CREATININE 0.79 02/13/2024 0248   CREATININE 0.91 12/15/2023 1509   CALCIUM 8.8 (L) 02/13/2024 0248   PROT 5.8 (L) 02/09/2024 0607   PROT 7.0 06/17/2023 0936   ALBUMIN 2.2 (L) 02/09/2024 0607   ALBUMIN 4.4 06/17/2023 0936   AST 23 02/09/2024 0607   AST 26 12/15/2023 1509   ALT 20 02/09/2024 0607   ALT 25 12/15/2023 1509   ALKPHOS 109 02/09/2024 0607    BILITOT 0.7 02/09/2024 0607   BILITOT 0.3 12/15/2023 1509   GFRNONAA >60 02/13/2024 0248   GFRNONAA >60 12/15/2023 1509   GFRAA >60 12/19/2016 0514   Lipase     Component Value Date/Time   LIPASE 27 01/30/2024 1800    Studies/Results: No results found.  Anti-infectives: Anti-infectives (From admission, onward)    Start     Dose/Rate Route Frequency Ordered Stop   02/14/24 0000  amoxicillin-clavulanate (AUGMENTIN) 875-125 MG tablet       Note to Pharmacy: Patient to d/c home 3/18, course to complete 3/20   1 tablet Oral Every 12 hours 02/13/24 1514 02/16/24 2359   02/13/24 1400  amoxicillin-clavulanate (AUGMENTIN) 875-125 MG per tablet 1 tablet        1 tablet Oral Every 12 hours 02/13/24 1055     02/07/24 1615  piperacillin-tazobactam (ZOSYN) IVPB 3.375 g  Status:  Discontinued        3.375 g 12.5 mL/hr over 240 Minutes Intravenous Every 8 hours 02/07/24 1522 02/13/24 1055        Assessment/Plan POD 13, s/p ex lap, SBR, repair of small bowel serosa x 1, LOA x 100 minutes for SBO by Dr. Cliffton Asters on 02/01/24 - Developed intra-abdominal abscesses s/p IR drain 3/13 -  monitor output (25cc/24 hours). Became frankly purulent over the weekend. He is afebrile, white count is normal, abdominal exam overall reassuring.  - Drain and flushes per IR.  - Cont abx. Drain cx w/ E. Coli. 2 more days of Augmentin prescribed at discharge. - Bolus TF's per RD. D3 diet and shakes as desired for comfort - TPN off - Multimodal pain control - Midline wound opened 3/12.  Clean base.  BID WTD. He reports his wife will help with this at d/c - Ambulate, PT - no f/u recommended - Pulm toilet  He is off TPN, back on bolus TF's, tolerating po for comfort and having bowel function. On D3 diet for options. His drain is purulent but output downtrending and is afebrile w/ normal wbc and reassuring/improved exam.  po abx based on cx and have f/u with IR for repeat CT when drain output tapers off. He reports his  wife plans to help w/ wound care at home. He has been cleared by PT. Patient is surgically stable for DC home today.  Scripts sent to his pharmacy.  D/w primary service.  Follow up arranged.  FEN - Bolus tube feeds via G-tube, D3 for comfort, TPN off, IVF per primary. If any worsening abdominal pain, distension, n/v make npo, place g-tube to gravity and let our team know.  VTE - SCDs, argatroban gtt - okay to transition to PO anticoagulation  ID - Zosyn. Afebrile.  Foley - None, spont void.    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

## 2024-02-14 NOTE — Progress Notes (Signed)
 TOC meds in a secure bag x2 delivered to pt in room by this RN

## 2024-02-14 NOTE — Plan of Care (Signed)
   Problem: Education: Goal: Knowledge of General Education information will improve Description Including pain rating scale, medication(s)/side effects and non-pharmacologic comfort measures Outcome: Progressing   Problem: Health Behavior/Discharge Planning: Goal: Ability to manage health-related needs will improve Outcome: Progressing

## 2024-02-14 NOTE — Discharge Summary (Signed)
 Physician Discharge Summary   Harry Williams ZHY:[id number] DOB: Mar 25, 1981 DOA: 01/30/2024  PCP: [name], MD  Admit date: 01/30/2024 Discharge date: 02/14/2024  Admitted From: Home Disposition:  Home Discharging physician: Lewie Chamber, MD Barriers to discharge: none  Recommendations at discharge: Follow up with general surgery Follow up with drain clinic  Ensure compliance and affordability with Eliquis; needs Lovenox if not   Discharge Condition: stable CODE STATUS: Full  Diet recommendation:  Diet Orders (From admission, onward)     Start     Ordered   02/13/24 1112  DIET DYS 3 Room service appropriate? Yes; Fluid consistency: Thin  Diet effective now       Question Answer Comment  Room service appropriate? Yes   Fluid consistency: Thin      02/13/24 1111            Hospital Course: Mr. 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  He underwent ex lap with small bowel resection, repair of small bowel serosa, and lysis of adhesions with general surgery on 02/01/2024.  Assessment and Plan:  High Grade Small Bowel Obstruction S/p Total Colectomy for FAP in 2016 S/p PEG Appreciate general surgery's assistance - s/p ex lap with small bowel resection, repair of small bowel serosa x1, lysis of adhesions on 3/5 -Continue Osmolite per home regimen -Tolerating oral liquids - CT performed 3/11 showing fluid collection; IR performed aspiration on 02/09/2024, 100 cc purulent fluid - s/p G-tube exchange on 3/12 - outpt follow up  with drain clinic at discharge    Right internal jugular thrombus  - not on [facility] for approx 1 month prior to admission (couldn't afford Eliquis and ran out of Lovenox before they could follow up again) - repeat CT neck obtained 3/14 showing persistent right internal jugular thrombus - pharmacy ran price check on Eliquis and Xarelto, both around 170/mth.  Discussed with patient, his wife, and oncology.  Patient has elected for Eliquis at discharge.  Updated Dr. [name] as well - no need to load Eliquis since therapeutic on Argatroban for 1 week   Fever -Suspected from underlying abdominal fluid collection at this time but also starting to wonder if component of fever possibly in setting of malignancy and/or even persistent thrombus in RIJ.  Also some risk for atelectasis given prolonged hospitalization and bedbound.  - Blood cultures from 02/06/2024 remain negative - UA not concerning for UTI -Continue Zosyn; WBC has normalized as of 3/16  - abdominal fluid culture; few E. 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 Following with Dr. [name] from oncology   T wave inversion Leads II, III, aVF and V5, V6 Negative troponin - hold off  on echo at this time   Asthma Not in exacerbation   Hyperglycemia Follow A1c (5.1)    The patient's acute and chronic medical conditions were treated accordingly. On day of discharge, patient was felt deemed stable for discharge. Patient/family member advised to call PCP or come back to ER if needed.   Principal Diagnosis: SBO (small  bowel obstruction) (HCC)  Discharge Diagnoses: Active Hospital Problems   Diagnosis Date Noted   SBO (small bowel obstruction) (HCC) 01/31/2024    Priority: 1.   AKI (acute kidney injury) (HCC) 01/31/2024    Priority: 2.   Malignant neoplasm of tonsillar fossa (HCC) 09/14/2023    Priority: 3.   Hyponatremia 01/31/2024   Hyperkalemia 01/31/2024   Hypochloremia 01/31/2024   Internal jugular (IJ) vein thromboembolism, acute, right (HCC) 12/16/2023    Resolved Hospital Problems  No resolved problems to display.     Discharge Instructions     Discharge wound care:   Complete by: As directed    Cleanse with saline, pat gently dry. Fill defects with saline moistened gauze (one 2x2 gauze is enough), top with dry gauze, and secure with tape. Change PRN for soiling, otherwise twice daily.   Increase activity slowly   Complete by: As directed       Allergies as of 02/14/2024   No Known Allergies      Medication List     STOP taking these medications    HYDROcodone-acetaminophen 7.5-325 mg/15 ml solution Commonly known as: HYCET   rivaroxaban 20 MG Tabs tablet Commonly known as: XARELTO       TAKE these medications    amoxicillin-clavulanate 875-125 MG tablet Commonly known as: AUGMENTIN Take 1 tablet by mouth every 12 (twelve) hours for 2 days.   apixaban 5 MG Tabs tablet Commonly known as: ELIQUIS Take 1 tablet (5 mg total) by mouth 2 (two) times daily.   baclofen 10 MG tablet Commonly known as: LIORESAL Take 1 tablet (10 mg total) by mouth 3 (three) times daily. What changed:  when to take this reasons to take this   celecoxib 200 MG capsule Commonly known as: CELEBREX Place 1 capsule (200 mg total) into feeding tube daily as needed for moderate pain (pain score 4-6).   dicyclomine 20 MG tablet Commonly known as: BENTYL Take 1 tablet (20 mg total) by mouth 2 (two) times daily. What changed:  when to take this reasons to take this   feeding supplement  (OSMOLITE 1.5 CAL) Liqd 7 cartons Osmolite 1.5 split over four feeding daily. Flush tube with 60 ml water before and after each bolus. Provide additional 3 1/2 cups (830 ml) water/day to meet hydration needs. Provides 2485 kcal, 104.3 g protein, 1267 ml free water from formula (2577 ml total water with flushes). 1659 ml/day meets 100% RDI   lidocaine 2 % solution Commonly known as: XYLOCAINE Patient: Mix 1part 2% viscous lidocaine, 1part H20. Swish & swallow 10mL of diluted mixture, before meals and at bedtime, up to QID What changed:  how much to take how to take this when to take this reasons to take this additional instructions   methocarbamol 500 MG tablet Commonly known as: ROBAXIN Take 2 tablets (1,000 mg total) by mouth every 6 (six) hours as needed for muscle spasms.   omeprazole 20 MG capsule Commonly known as: PRILOSEC Take 20 mg by mouth See admin instructions. 20 mg per tube once a day PRN   ondansetron 8 MG disintegrating tablet Commonly known as: ZOFRAN-ODT  8mg  ODT q8hours prn nausea What changed:  how much to take how to take this when to take this reasons to take this   oxyCODONE 5 MG/5ML solution Commonly known as: ROXICODONE Take 5-10 mLs (5-10 mg total) by mouth every 6 (six) hours as needed for moderate pain (pain score 4-6) or severe pain (pain score 7-10).   PARoxetine 12.5 MG 24 hr tablet Commonly known as: Paxil CR Take 1 tablet (12.5 mg total) by mouth daily.   scopolamine 1 MG/3DAYS Commonly known as: TRANSDERM-SCOP Place 1 patch (1.5 mg total) onto the skin every 3 (three) days.   sodium chloride flush 0.9 % Soln Commonly known as: NS 10 mLs by Intracatheter route daily for 14 days.               Discharge Care Instructions  (From admission, onward)           Start     Ordered   02/14/24 0000  Discharge wound care:       Comments: Cleanse with saline, pat gently dry. Fill defects with saline moistened gauze (one 2x2 gauze  is enough), top with dry gauze, and secure with tape. Change PRN for soiling, otherwise twice daily.   02/14/24 1053            Follow-up Information     Andria Meuse, MD Follow up on 03/05/2024.   Specialties: General Surgery, Colon and Rectal Surgery Why: 1000am. Please bring a copy of your photo ID, insurance card and arrive 30 minutes prior to your appointment for paperwork. Contact information: [address] [place] [address] [phone]         Bennie Dallas, MD Follow up in 1 week(s).   Specialties: Interventional Radiology, Diagnostic Radiology, Radiology Why: Their office should call you with appointment date and time for drain follow up.  Please call their office if you have any questions or concerns regarding your drain. Contact information: [address] [place] [address] [phone]                No Known Allergies  Consultations: General surgery  Procedures:  02/01/24 PROCEDURE:  Exploratory laparotomy with small bowel resection Repair of small bowel serosa x 1 Lysis of adhesions x 100 minutes   02/09/24: IR drain placement fluid collection  Discharge Exam: BP 110/68 (BP Location: Right Arm)   Pulse 88   Temp 98.2 F (36.8 C)   Resp 18   Ht 5\' 8"  (1.727 m)   Wt 65.3 kg   SpO2 100%   BMI 21.88 kg/m  Physical Exam Constitutional:      General: He is not in acute distress.    Appearance: Normal appearance.  HENT:     Head: Normocephalic and atraumatic.     Mouth/Throat:     Mouth: Mucous membranes are moist.  Eyes:     Extraocular Movements: Extraocular movements intact.  Cardiovascular:     Rate and Rhythm: Normal rate and regular rhythm.  Pulmonary:     Effort: Pulmonary effort is normal. No respiratory distress.     Breath sounds: Normal breath sounds. No wheezing.  Abdominal:     General: Bowel sounds are normal. There is distension (very mild firmness).     Tenderness:  There is generalized abdominal tenderness.  Musculoskeletal:        General: Normal range of motion.     Cervical back: Normal range of motion and neck supple.  Skin:    General: Skin is warm and dry.  Neurological:     General: No focal deficit present.     Mental Status: He is alert.  Psychiatric:        Mood and Affect: Mood normal.        Behavior: Behavior normal.      The results of significant diagnostics from this hospitalization (including imaging, microbiology, ancillary and laboratory) are listed below for reference.   Microbiology: Recent Results (from the past 240 hours)  Culture, blood (Routine X 2) w Reflex to ID Panel     Status: None   Collection Time: 02/06/24  8:42 AM   Specimen: BLOOD RIGHT ARM  Result Value Ref Range Status   Specimen Description   Final    BLOOD RIGHT ARM Performed at [facility], [address], [place], [place] [address]    Special Requests   Final    BOTTLES DRAWN AEROBIC AND ANAEROBIC Blood Culture adequate volume Performed at [facility], [address], [place], [place] [address]    Culture   Final    NO GROWTH 5 DAYS Performed at [facility], [address], [place], [place] [address]    Report Status 02/11/2024 FINAL  Final  Culture, blood (Routine X 2) w Reflex to ID Panel     Status: None   Collection Time: 02/06/24  8:42 AM   Specimen: BLOOD RIGHT HAND  Result Value Ref Range Status   Specimen Description   Final    BLOOD RIGHT HAND Performed at [facility], [address] [place], [place] [address]    Special Requests   Final    BOTTLES DRAWN AEROBIC AND ANAEROBIC Blood Culture adequate volume Performed at [facility], [address], [place], [place] [address]    Culture   Final    NO GROWTH 5 DAYS Performed at [facility], [address], [place], [place] [address]    Report Status 02/11/2024 FINAL  Final  Aerobic/Anaerobic Culture w Gram Stain  (surgical/deep wound)     Status: None   Collection Time: 02/09/24 11:00 AM   Specimen: Abscess  Result Value Ref Range Status   Specimen Description ABSCESS  Final   Special Requests ABDOMINAL  Final   Gram Stain   Final    ABUNDANT WBC PRESENT, PREDOMINANTLY PMN FEW GRAM NEGATIVE RODS    Culture   Final    FEW ESCHERICHIA COLI MODERATE BACTEROIDES FRAGILIS BETA LACTAMASE NEGATIVE Performed at [facility], [address] [place], [place] [address]    Report Status 02/12/2024 FINAL  Final   Organism ID, Bacteria ESCHERICHIA COLI  Final      Susceptibility   Escherichia coli - MIC*    AMPICILLIN <=2 SENSITIVE Sensitive     CEFEPIME <=0.12 SENSITIVE Sensitive     CEFTAZIDIME <=1 SENSITIVE Sensitive     CEFTRIAXONE <=0.25 SENSITIVE Sensitive     CIPROFLOXACIN <=0.25 SENSITIVE Sensitive     GENTAMICIN <=1 SENSITIVE Sensitive     IMIPENEM <=0.25 SENSITIVE Sensitive     TRIMETH/SULFA <=20 SENSITIVE Sensitive     AMPICILLIN/SULBACTAM <=2 SENSITIVE Sensitive     PIP/TAZO <=4 SENSITIVE Sensitive ug/mL    * FEW ESCHERICHIA COLI     Labs: BNP (last 3 results) No results for input(s): "BNP" in the last 8760 hours. Basic Metabolic Panel: Recent Labs  Lab 02/08/24 0038 02/09/24 9629 02/10/24 0527 02/13/24 0248  NA 137 136 130* 134*  K 3.7 4.4 4.4 4.4  CL 105 104 98 101  CO2 21* 21* 23 23  GLUCOSE 110* 129* 123* 103*  BUN 16 16 15 16   CREATININE 0.71 0.81 0.75 0.79  CALCIUM 8.2* 8.4* 8.3* 8.8*  MG  --  2.0  --  2.1  PHOS  --  3.7  --   --    Liver Function Tests: Recent Labs  Lab 02/09/24 0607  AST 23  ALT 20  ALKPHOS 109  BILITOT 0.7  PROT 5.8*  ALBUMIN 2.2*   No results for input(s): "LIPASE", "AMYLASE" in the last 168 hours. No results for input(s): "AMMONIA" in the last 168 hours. CBC: Recent Labs  Lab 02/10/24 0527 02/11/24 0655 02/12/24 0307 02/13/24 0248 02/14/24 0350  WBC 9.6 11.3* 9.5 8.9 10.1  HGB 9.2* 9.9* 10.3* 10.8* 10.5*  HCT 28.6*  31.1* 32.6* 34.1* 32.8*  MCV 94.4 94.0 96.2 95.3 94.3  PLT 238 311 392 489* 466*   Cardiac Enzymes: No results for input(s): "CKTOTAL", "CKMB", "CKMBINDEX", "TROPONINI" in the last 168 hours. BNP: Invalid input(s): "POCBNP" CBG: Recent Labs  Lab 02/13/24 1138 02/13/24 1808 02/13/24 2333 02/14/24 0546 02/14/24 1215  GLUCAP 82 111* 133* 101* 136*   D-Dimer No results for input(s): "DDIMER" in the last 72 hours. Hgb A1c No results for input(s): "HGBA1C" in the last 72 hours. Lipid Profile No results for input(s): "CHOL", "HDL", "LDLCALC", "TRIG", "CHOLHDL", "LDLDIRECT" in the last 72 hours. Thyroid function studies No results for input(s): "TSH", "T4TOTAL", "T3FREE", "THYROIDAB" in the last 72 hours.  Invalid input(s): "FREET3" Anemia work up No results for input(s): "VITAMINB12", "FOLATE", "FERRITIN", "TIBC", "IRON", "RETICCTPCT" in the last 72 hours. Urinalysis    Component Value Date/Time   COLORURINE AMBER (A) 02/06/2024 1710   APPEARANCEUR CLEAR 02/06/2024 1710   LABSPEC 1.038 (H) 02/06/2024 1710   PHURINE 5.0 02/06/2024 1710   GLUCOSEU NEGATIVE 02/06/2024 1710   HGBUR NEGATIVE 02/06/2024 1710   BILIRUBINUR NEGATIVE 02/06/2024 1710   KETONESUR NEGATIVE 02/06/2024 1710   PROTEINUR 30 (A) 02/06/2024 1710   NITRITE NEGATIVE 02/06/2024 1710   LEUKOCYTESUR NEGATIVE 02/06/2024 1710   Sepsis Labs Recent Labs  Lab 02/11/24 0655 02/12/24 0307 02/13/24 0248 02/14/24 0350  WBC 11.3* 9.5 8.9 10.1   Microbiology Recent Results (from the past 240 hours)  Culture, blood (Routine X 2) w Reflex to ID Panel     Status: None   Collection Time: 02/06/24  8:42 AM   Specimen: BLOOD RIGHT ARM  Result Value Ref Range Status   Specimen Description   Final    BLOOD RIGHT ARM Performed at [facility], [address], [place], [place] [address]    Special Requests   Final    BOTTLES DRAWN AEROBIC AND ANAEROBIC Blood Culture adequate volume Performed at [facility], [address] [place], [place] [address]    Culture   Final    NO GROWTH 5 DAYS Performed at [facility], [address], [place], [place] [address]    Report Status 02/11/2024 FINAL  Final  Culture, blood (Routine X 2) w Reflex to ID Panel     Status: None   Collection Time: 02/06/24  8:42 AM   Specimen: BLOOD RIGHT HAND  Result Value Ref Range Status   Specimen Description   Final    BLOOD RIGHT HAND Performed at [facility], [address], [place], [place] [address]  Special Requests   Final    BOTTLES DRAWN AEROBIC AND ANAEROBIC Blood Culture adequate volume Performed at [facility], [address], [place], [place] [address]    Culture   Final    NO GROWTH 5 DAYS Performed at [facility], [address] [place], [place] [address]    Report Status 02/11/2024 FINAL  Final  Aerobic/Anaerobic Culture w Gram Stain (surgical/deep wound)     Status: None   Collection Time: 02/09/24 11:00 AM   Specimen: Abscess  Result Value Ref Range Status   Specimen Description ABSCESS  Final   Special Requests ABDOMINAL  Final   Gram Stain   Final    ABUNDANT WBC PRESENT, PREDOMINANTLY PMN FEW GRAM NEGATIVE RODS    Culture   Final    FEW ESCHERICHIA COLI MODERATE BACTEROIDES FRAGILIS BETA LACTAMASE NEGATIVE Performed at [facility], [address], [place], [place] [address]    Report Status 02/12/2024 FINAL  Final   Organism ID, Bacteria ESCHERICHIA COLI  Final      Susceptibility   Escherichia coli - MIC*    AMPICILLIN <=2 SENSITIVE Sensitive     CEFEPIME <=0.12 SENSITIVE Sensitive     CEFTAZIDIME <=1 SENSITIVE Sensitive     CEFTRIAXONE <=0.25 SENSITIVE Sensitive     CIPROFLOXACIN <=0.25 SENSITIVE Sensitive     GENTAMICIN <=1 SENSITIVE Sensitive     IMIPENEM <=0.25 SENSITIVE Sensitive     TRIMETH/SULFA <=20 SENSITIVE Sensitive     AMPICILLIN/SULBACTAM <=2 SENSITIVE Sensitive     PIP/TAZO <=4 SENSITIVE  Sensitive ug/mL    * FEW ESCHERICHIA COLI    Procedures/Studies: CT SOFT TISSUE NECK W CONTRAST Result Date: 02/10/2024 CLINICAL DATA:  Right IJ thrombus EXAM: CT NECK WITH CONTRAST TECHNIQUE: Multidetector CT imaging of the neck was performed using the standard protocol following the bolus administration of intravenous contrast. RADIATION DOSE REDUCTION: This exam was performed according to the departmental dose-optimization program which includes automated exposure control, adjustment of the mA and/or kV according to patient size and/or use of iterative reconstruction technique. CONTRAST:  75mL OMNIPAQUE IOHEXOL 300 MG/ML  SOLN COMPARISON:  11/18/2023 FINDINGS: Pharynx and larynx: Small retropharyngeal effusion. Normal epiglottis. There is mild soft tissue asymmetry in the left vallecula. No discrete mass visualized. Salivary glands: No inflammation, mass, or stone. Thyroid: Normal. Lymph nodes: None enlarged or abnormal density. 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 Limited intracranial: Negative. Visualized orbits: Negative. Mastoids and visualized paranasal sinuses: Clear. Skeleton: No acute or aggressive process. Upper chest: Negative. Other: None. IMPRESSION: 1. 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 2. Small retropharyngeal effusion, likely reactive. 3. Mild soft tissue asymmetry in the left vallecula without discrete mass visualized. Direct visualization is recommended. Electronically Signed   By: Deatra Robinson M.D.   On: 02/10/2024 19:35   CT GUIDED PERITONEAL/RETROPERITONEAL FLUID DRAIN BY PERC CATH Result Date: 02/09/2024 INDICATION: 43 year old male with history of postoperative abdominopelvic fluid collection concerning for abscess. EXAM: CT PERC DRAIN PERITONEAL ABCESS COMPARISON:  02/07/2024 MEDICATIONS: The patient is currently admitted to the hospital and receiving intravenous antibiotics. The antibiotics were administered within an  appropriate time frame prior to the initiation of the procedure. ANESTHESIA/SEDATION: Moderate (conscious) sedation was employed during this procedure. A total of Versed 2 mg and Fentanyl 100 mcg was administered intravenously. Moderate Sedation Time: 10 minutes. The patient's level of consciousness and vital signs were monitored continuously by radiology nursing throughout the procedure under  my direct supervision. CONTRAST:  None COMPLICATIONS: None immediate. PROCEDURE: RADIATION DOSE REDUCTION: This exam was performed according to the departmental dose-optimization program which includes automated exposure control, adjustment of the mA and/or kV according to patient size and/or use of iterative reconstruction technique. Informed written consent was obtained from the patient after a discussion of the risks, benefits and alternatives to treatment. The patient was placed supine on the CT gantry and a pre procedural CT was performed re-demonstrating the known abscess/fluid collection within the anterior pelvis. The procedure was planned. A timeout was performed prior to the initiation of the procedure. The lower abdomen was prepped and draped in the usual sterile fashion. The overlying soft tissues were anesthetized with 1% lidocaine with epinephrine. Appropriate trajectory was planned with the use of a 22 gauge spinal needle. An 18 gauge trocar needle was advanced into the abscess/fluid collection and a short Amplatz super stiff wire was coiled within the collection. Appropriate positioning was confirmed with a limited CT scan. The tract was serially dilated allowing placement of a 10 [place] all-purpose drainage catheter. Appropriate positioning was confirmed with a limited postprocedural CT scan. Proximally 100 ml of purulent fluid was aspirated. The tube was connected to a bulb suction and sutured in place. A dressing was placed. The patient tolerated the procedure well without immediate post procedural  complication. IMPRESSION: Successful CT guided placement of a 10 French all purpose drain catheter into the anterior pelvic fluid collection with aspiration of 100 mL of purulent fluid. Samples were sent to the laboratory as requested by the ordering clinical team. Marliss Coots, MD [facility] Electronically Signed   By: Marliss Coots M.D.   On: 02/09/2024 13:43   DG ABDOMEN PEG TUBE LOCATION Result Date: 02/08/2024 CLINICAL DATA:  Gastrostomy status. EXAM: ABDOMEN - 1 VIEW COMPARISON:  Abdominal CT yesterday FINDINGS: Supine view of the abdomen obtained after the installation of enteric contrast through indwelling gastrostomy tube contrast is within the stomach. There is no evidence of extravasation or leak. Few air-filled prominent loops of small bowel centrally. IMPRESSION: Gastrostomy tube within the stomach. No extravasation or leak. Electronically Signed   By: Narda Rutherford M.D.   On: 02/08/2024 22:23   IR Replc Gastro/Colonic Tube Percut W/Fluoro Result Date: 02/08/2024 INDICATION: Patient with history of tonsillar cancer, dysphagia, prior 73 French balloon retention gastrostomy tube placement in October of last year; now with broken hub/leaking. Request received for gastrostomy tube exchange. EXAM: GASTROSTOMY TUBE EXCHANGE MEDICATIONS: None ANESTHESIA/SEDATION: None CONTRAST:  Abdominal film with contrast pending FLUOROSCOPY: Abdominal film pending COMPLICATIONS: None immediate. PROCEDURE: Informed consent was obtained from the patient/spouse after a thorough discussion of the procedural risks, benefits and alternatives. All questions were addressed. A timeout was performed prior to the initiation of the procedure. Bandage and bag drain was removed from gastrostomy tube; 6 cc saline was removed from retention balloon and using manual traction the tube was removed in its entirety without immediate complications. A new 16 french entuit  balloon retention gastrostomy tube was then inserted, balloon filled with 7 cc saline. Tube was flushed, secured to skin and split gauze dressing applied to site. IMPRESSION: Successful exchange for a new 16 Fr balloon-retention gastrostomy tube. Performed by: Artemio Aly RECOMMENDATIONS: The patient will return to Vascular Interventional Radiology (VIR) for routine feeding tube evaluation and exchange in 6 months. Roanna Banning, MD [facility] Radiology Electronically Signed   By: Roanna Banning M.D.   On:  02/08/2024 16:42   CT ABDOMEN PELVIS W CONTRAST Result Date: 02/07/2024 CLINICAL DATA:  Postoperative abdominal pain EXAM: CT ABDOMEN AND PELVIS WITH CONTRAST TECHNIQUE: Multidetector CT imaging of the abdomen and pelvis was performed using the standard protocol following bolus administration of intravenous contrast. RADIATION DOSE REDUCTION: This exam was performed according to the departmental dose-optimization program which includes automated exposure control, adjustment of the mA and/or kV according to patient size and/or use of iterative reconstruction technique. CONTRAST:  OMNIPAQUE IOHEXOL 300 MG/ML SOLN, 30mL OMNIPAQUE IOHEXOL 300 MG/ML SOLN COMPARISON:  01/30/2024 FINDINGS: Lower chest: Bibasilar atelectasis. Central venous catheter tip noted within the superior right atrium. No acute abnormality. Hepatobiliary: No focal liver abnormality is seen. No gallstones, gallbladder wall thickening, or biliary dilatation. Pancreas: Unremarkable Spleen: Unremarkable Adrenals/Urinary Tract: Adrenal glands are unremarkable. Kidneys are normal, without renal calculi, focal lesion, or hydronephrosis. Bladder is unremarkable. Stomach/Bowel: Surgical changes of probable subtotal colectomy are again identified. There has been interval partial small bowel resection with anastomotic staple line seen within the mid abdomen. Punctate foci of free intraperitoneal gas  or likely postoperative in nature. There is infiltration of the omentum which is nonspecific in the immediate postoperative setting. Similarly, peritoneal enhancement may relate to peritoneal inflammation or simply represent postoperative change. There are multiple loculated fluid collections within the abdomen demonstrating some degree of organization and rim enhancement: Right upper quadrant, axial image # 35/2, 5.7 x 3.2 cm. Left mid abdominal mesenteric folds, axial image # 50/2, 3.0 x 5.0 cm Right lower quadrant, axial image # 62/2, 1.5 x 4.6 cm Pelvis, axial image # 71/2, 9.4 x 12.8 x 6.6 cm (volume = 420 cm^3) There is tethering of bowel in the region of the small bowel resection, best appreciated on coronal image # 30/6 with areas of fat necrosis in this region, best appreciated on image # 31/6. Several relatively narrowed loops of bowel, however, suggest an underlying partial small bowel obstruction. Vascular/Lymphatic: No significant vascular findings are present. No enlarged abdominal or pelvic lymph nodes. Reproductive: Prostate is unremarkable. Other: Mild diffuse subcutaneous body wall edema. Musculoskeletal: Bilateral L5 pars defects are present with grade 1 anterolisthesis L5-S1. No acute bone abnormality. IMPRESSION: 1. Interval partial small bowel resection with anastomotic staple line seen within the mid abdomen. Punctate foci of free intraperitoneal gas are likely postoperative in nature. 2. Multiple loculated fluid collections within the abdomen and pelvis demonstrating some degree of organization and rim enhancement, the largest within the pelvis measuring up to 12.8 cm in greatest dimension (volume = 420 cc). These may represent postoperative seromas, contain bowel leak, or abscesses. 3. Tethering of bowel in the region of the small bowel resection with areas of fat necrosis in this region. Several relatively narrowed loops of bowel, however, suggest an underlying partial small bowel  obstruction. 4. Infiltration of the omentum and peritoneal enhancement which may relate to peritoneal inflammation or simply represent postoperative change. 5. Bilateral L5 pars defects with grade 1 anterolisthesis L5-S1. 6. Mild diffuse subcutaneous body wall edema. Electronically Signed   By: Helyn Numbers M.D.   On: 02/07/2024 22:24   DG CHEST PORT 1 VIEW Result Date: 02/06/2024 CLINICAL DATA:  Fevers EXAM: PORTABLE CHEST 1 VIEW COMPARISON:  11/01/2023 FINDINGS: Cardiac shadow is within normal limits. Lungs are well aerated bilaterally. Right chest wall port is again seen. No acute bony abnormality is noted. IMPRESSION: No active disease. Electronically Signed   By: Alcide Clever M.D.   On: 02/06/2024 10:20   DG  Abd 1 View Result Date: 02/04/2024 CLINICAL DATA:  Abdominal pain.  Constipation. EXAM: ABDOMEN - 1 VIEW COMPARISON:  February 01, 2024.  January 30, 2024. FINDINGS: The bowel gas pattern is normal. Probable right renal calculus is noted. Phleboliths are seen in right pelvis. IMPRESSION: No abnormal bowel dilatation.  Possible right renal calculus. Electronically Signed   By: Lupita Raider M.D.   On: 02/04/2024 14:18   [name] EKG SITE RITE Result Date: 02/04/2024 If Site Rite image not attached, placement could not be confirmed due to current cardiac rhythm.  DG Abd Portable 1V Result Date: 02/01/2024 CLINICAL DATA:  Small bowel obstruction. EXAM: PORTABLE ABDOMEN - 1 VIEW COMPARISON:  January 31, 2024.  January 30, 2024. FINDINGS: Continued small bowel dilatation is noted concerning for distal small bowel obstruction. Phleboliths are noted in the pelvis. IMPRESSION: Continued small bowel dilatation concerning for distal small bowel obstruction. Electronically Signed   By: Lupita Raider M.D.   On: 02/01/2024 11:30   DG Abd Portable 1V-Small Bowel Obstruction Protocol-initial, 8 hr delay Result Date: 01/31/2024 CLINICAL DATA:  Bowel obstruction 8 hour delay EXAM: PORTABLE ABDOMEN - 1 VIEW COMPARISON:  CT  01/30/2024 FINDINGS: Contrast within the urinary bladder. Severe small bowel distension up to 6.5 cm consistent with high-grade bowel obstruction, configuration similar to scout image from CT on 01/30/2024. Question dilute contrast within dilated left abdominal small bowel but definitely no colon contrast is visible IMPRESSION: Persistent dilatation of small bowel consistent with high-grade bowel obstruction. Question dilute contrast within dilated left abdominal small bowel but no colon contrast Electronically Signed   By: Jasmine Pang M.D.   On: 01/31/2024 23:04   CT ABDOMEN PELVIS W CONTRAST Result Date: 01/30/2024 CLINICAL DATA:  Abdominal pain.  Bowel obstruction suspected. EXAM: CT ABDOMEN AND PELVIS WITH CONTRAST TECHNIQUE: Multidetector CT imaging of the abdomen and pelvis was performed using the standard protocol following bolus administration of intravenous contrast. RADIATION DOSE REDUCTION: This exam was performed according to the departmental dose-optimization program which includes automated exposure control, adjustment of the mA and/or kV according to patient size and/or use of iterative reconstruction technique. CONTRAST:  OMNIPAQUE IOHEXOL 300 MG/ML  SOLN COMPARISON:  CT 01/26/2024 FINDINGS: Lower chest: No acute abnormality. Hepatobiliary: No acute abnormality. Pancreas: Unremarkable. Spleen: Unremarkable. Adrenals/Urinary Tract: Within normal limits. Stomach/Bowel: Percutaneous gastrostomy tube in unchanged position. Postoperative changes of colectomy with ileoanal anastomosis. Diffuse dilation of the small bowel. There is an abrupt transition point in the right central abdomen (series 8/image [id number]). There is an adjacent transition point downstream from the original transition point with dilation of the small bowel between the transition points (series 8/image 56). Findings are compatible with closed loop obstruction. No signs of ischemic bowel. Vascular/Lymphatic: No significant  vascular findings are present. No enlarged abdominal or pelvic lymph nodes. Reproductive: Unremarkable. Other: Small volume free fluid in the pelvis. No free intraperitoneal air. Musculoskeletal: No acute fracture. 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 IMPRESSION: High-grade small bowel closed loop obstruction in the right hemiabdomen. No evidence of ischemia. Critical Value/emergent results were called by telephone at the time of interpretation on 01/30/2024 at 11:10 pm to provider Riki Sheer , who verbally acknowledged these results. Electronically Signed   By: Minerva Fester M.D.   On: 01/30/2024 23:11   CT ABDOMEN PELVIS W CONTRAST Result Date: 01/26/2024 CLINICAL DATA:  Abdominal pain EXAM: CT ABDOMEN AND PELVIS WITH CONTRAST TECHNIQUE: Multidetector CT imaging of the abdomen and pelvis was performed using  the standard protocol following bolus administration of intravenous contrast. RADIATION DOSE REDUCTION: This exam was performed according to the departmental dose-optimization program which includes automated exposure control, adjustment of the mA and/or kV according to patient size and/or use of iterative reconstruction technique. CONTRAST:  OMNIPAQUE IOHEXOL 300 MG/ML  SOLN COMPARISON:  09/07/2023 FINDINGS: Lower chest: No acute abnormality. Hepatobiliary: No focal liver abnormality is seen. No gallstones, gallbladder wall thickening, or biliary dilatation. Pancreas: Unremarkable. No pancreatic ductal dilatation or surrounding inflammatory changes. Spleen: Normal in size without focal abnormality. Adrenals/Urinary Tract: Adrenal glands are within normal limits. Kidneys demonstrate a normal enhancement pattern bilaterally. No calculi or obstructive changes are seen. The bladder is decompressed. Stomach/Bowel: Gastrostomy catheter is noted in place. No inflammatory changes surrounding the catheter are seen. 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 Ileo anal anastomosis is  seen. Some mildly prominent loops of small bowel are noted in the mid abdomen. These are noted just proximal to the ileoanal anastomosis and relatively similar to that seen on the prior exam. This likely represents some chronic dilatation proximal to the anastomosis. Vascular/Lymphatic: No significant vascular findings are present. No enlarged abdominal or pelvic lymph nodes. Reproductive: Prostate is unremarkable. Other: No abdominal wall hernia or abnormality. No abdominopelvic ascites. 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 IMPRESSION: Changes consistent with prior colectomy with ileoanal anastomosis. Some dilated loops are noted proximal to this but chronic in nature. No other focal abnormality is noted. Electronically Signed   By: Alcide Clever M.D.   On: 01/26/2024 01:20     Time coordinating discharge: Over 30 minutes    Lewie Chamber, MD  [facility] 02/14/2024, 2:03 PM

## 2024-02-15 ENCOUNTER — Other Ambulatory Visit: Payer: Self-pay | Admitting: Surgery

## 2024-02-15 ENCOUNTER — Ambulatory Visit (HOSPITAL_COMMUNITY)
Admission: RE | Admit: 2024-02-15 | Discharge: 2024-02-15 | Disposition: A | Discharge status: Home / Self Care | Source: Ambulatory Visit | Attending: Radiology | Admitting: Radiology

## 2024-02-15 DIAGNOSIS — K56609 Unspecified intestinal obstruction, unspecified as to partial versus complete obstruction: Secondary | ICD-10-CM

## 2024-02-15 DIAGNOSIS — C090 Malignant neoplasm of tonsillar fossa: Secondary | ICD-10-CM | POA: Insufficient documentation

## 2024-02-16 ENCOUNTER — Ambulatory Visit (HOSPITAL_COMMUNITY): Payer: BC Managed Care – PPO

## 2024-02-16 ENCOUNTER — Encounter: Payer: Self-pay | Admitting: Oncology

## 2024-02-16 NOTE — Progress Notes (Deleted)
 Marland Kitchen

## 2024-02-17 ENCOUNTER — Other Ambulatory Visit: Payer: Self-pay | Admitting: Oncology

## 2024-02-17 DIAGNOSIS — R630 Anorexia: Secondary | ICD-10-CM

## 2024-02-17 MED ORDER — OLANZAPINE 5 MG PO TABS
5.0000 mg | ORAL_TABLET | Freq: Every day | ORAL | 2 refills | Status: DC
Start: 2024-02-17 — End: 2024-08-31

## 2024-02-20 ENCOUNTER — Ambulatory Visit (HOSPITAL_COMMUNITY)
Admission: RE | Admit: 2024-02-20 | Discharge: 2024-02-20 | Disposition: A | Discharge status: Home / Self Care | Source: Ambulatory Visit | Attending: Radiology | Admitting: Radiology

## 2024-02-20 DIAGNOSIS — C090 Malignant neoplasm of tonsillar fossa: Secondary | ICD-10-CM | POA: Insufficient documentation

## 2024-02-20 LAB — GLUCOSE, CAPILLARY: Glucose-Capillary: 86 mg/dL (ref 70–99)

## 2024-02-20 MED ORDER — FLUDEOXYGLUCOSE F - 18 (FDG) INJECTION
7.4220 | Freq: Once | INTRAVENOUS | Status: AC
Start: 2024-02-20 — End: 2024-02-20
  Administered 2024-02-20: 7.422 via INTRAVENOUS

## 2024-02-21 ENCOUNTER — Ambulatory Visit
Admission: RE | Admit: 2024-02-21 | Discharge: 2024-02-21 | Disposition: A | Discharge status: Home / Self Care | Payer: Self-pay | Source: Ambulatory Visit | Attending: Radiation Oncology | Admitting: Radiation Oncology

## 2024-02-21 DIAGNOSIS — R609 Edema, unspecified: Secondary | ICD-10-CM | POA: Insufficient documentation

## 2024-02-21 DIAGNOSIS — C090 Malignant neoplasm of tonsillar fossa: Secondary | ICD-10-CM | POA: Diagnosis present

## 2024-02-21 DIAGNOSIS — Z9221 Personal history of antineoplastic chemotherapy: Secondary | ICD-10-CM | POA: Insufficient documentation

## 2024-02-21 DIAGNOSIS — Z7901 Long term (current) use of anticoagulants: Secondary | ICD-10-CM | POA: Insufficient documentation

## 2024-02-21 DIAGNOSIS — M4317 Spondylolisthesis, lumbosacral region: Secondary | ICD-10-CM | POA: Insufficient documentation

## 2024-02-21 DIAGNOSIS — Z923 Personal history of irradiation: Secondary | ICD-10-CM | POA: Insufficient documentation

## 2024-02-21 DIAGNOSIS — Z79899 Other long term (current) drug therapy: Secondary | ICD-10-CM | POA: Insufficient documentation

## 2024-02-21 MED ORDER — MAGIC MOUTHWASH W/LIDOCAINE: 10.0000 mL | Freq: Four times a day (QID) | ORAL | 3 refills | Status: DC | PRN

## 2024-02-21 NOTE — Progress Notes (Signed)
 Radiation Oncology         [phone] ________________________________  Name: Harry Williams MRN: [id number]  Date: 02/21/2024  DOB: 12-06-1980  Follow-Up Visit Note  CC: Harry Banana, MD  Erskin Burnet*  Diagnosis and Prior Radiotherapy:    C09.0   ICD-10-CM   1. 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 Harry Williams presents today for a follow up to receive PET scan results. He completed radiation treatment for Malignant Neoplasm of the Tonsillar Fossa on 11/16/2023.  Pain issues, if any: Throat pain while eating foods, burning in the back of the throat while eating foods. Throat pain 7 out of 10 throat pain, triggered by certain foods like pizza, fish and heavily seasoned foods. Using a feeding tube?: Used to use feeding tube 7 times a day but since he was admitted for abdominal surgery, he says his stomach can't hold much feeding. Now he uses his feeding tube 3-4 times daily. Feels like stomach can't hold much foods. Says  breakfast foods don't irritate throat. Said other day ate Eggs, bacon and biscuit and he tolerated it  well with no irritation to throat. Weight changes, if any: Patient has lost a lot of weight, he lost weight, was 165, now is 143. Swallowing issues, if any: went through radiation and chemo, now has an aversion to eating meat. Smoking or chewing tobacco? Patient denies Using fluoride toothpaste daily? Yes Last ENT visit was on: October 2024 Other notable issues, if any:   Recent hospitalization and bowel surgery. No recent fevers or chills.   BP 108/79 (BP Location: Left Arm, Patient Position: Sitting)   Pulse (!) 105   Temp (!) 97.5 F (36.4 C) (Temporal)   Resp 18   Ht 5\' 8"  (1.727 m)   Wt 141 lb 2 oz (64 kg)   SpO2 98%   BMI 21.46 kg/m   Wt Readings from Last 3 Encounters:  02/21/24 141 lb 2 oz (64 kg)  02/14/24 143 lb 14.4 oz (65.3 kg)  01/25/24 155 lb (70.3 kg)     ALLERGIES:  has no known allergies.  Meds: Current Outpatient Medications  Medication Sig Dispense Refill   apixaban (ELIQUIS) 5 MG TABS tablet Take 1 tablet (5 mg total) by mouth 2 (two) times daily. 60 tablet 3   baclofen (LIORESAL) 10 MG tablet Take 1 tablet (10 mg total) by mouth 3 (three) times daily. (Patient taking differently: Take 10 mg by mouth 3 (three) times daily as needed for muscle spasms.) 30 each 0   celecoxib (CELEBREX) 200 MG capsule Place 1 capsule (200 mg total) into feeding tube daily as needed for moderate pain (pain score 4-6). 90 capsule 0   dicyclomine (BENTYL) 20 MG  tablet Take 1 tablet (20 mg total) by mouth 2 (two) times daily. (Patient taking differently: Take 20 mg by mouth 2 (two) times daily as needed for spasms.) 20 tablet 0   magic mouthwash w/lidocaine SOLN Take 10 mLs by mouth 4 (four) times daily as needed for mouth pain. Suspension contains equal amounts of Maalox Extra Strength, nystatin, diphenhydramine and lidocaine. 200 mL 3   methocarbamol (ROBAXIN) 500 MG tablet Take 2 tablets (1,000 mg total) by mouth every 6 (six) hours as needed for muscle spasms. 60 tablet 0    Nutritional Supplements (FEEDING SUPPLEMENT, OSMOLITE 1.5 CAL,) LIQD 7 cartons Osmolite 1.5 split over four feeding daily. Flush tube with 60 ml water before and after each bolus. Provide additional 3 1/2 cups (830 ml) water/day to meet hydration needs. Provides 2485 kcal, 104.3 g protein, 1267 ml free water from formula (2577 ml total water with flushes). 1659 ml/day meets 100% RDI     OLANZapine (ZYPREXA) 5 MG tablet Take 1 tablet (5 mg total) by mouth at bedtime. 30 tablet 2   ondansetron (ZOFRAN-ODT) 8 MG disintegrating tablet 8mg  ODT q8hours prn nausea (Patient taking differently: Take 8 mg by mouth every 8 (eight) hours as needed for nausea or vomiting. 8mg  ODT q8hours prn nausea) 4 tablet 0   oxyCODONE (ROXICODONE) 5 MG/5ML solution Take 5-10 mLs (5-10 mg total) by mouth every 6 (six) hours as needed for moderate pain (pain score 4-6) or severe pain (pain score 7-10). 200 mL 0   sodium chloride flush (NS) 0.9 % SOLN 10 mLs by Intracatheter route daily for 14 days. 140 mL 0   omeprazole (PRILOSEC) 20 MG capsule Take 20 mg by mouth See admin instructions. 20 mg per tube once a day PRN (Patient not taking: Reported on 02/21/2024)     PARoxetine (PAXIL CR) 12.5 MG 24 hr tablet Take 1 tablet (12.5 mg total) by mouth daily. (Patient not taking: Reported on 02/21/2024) 30 tablet 0   scopolamine (TRANSDERM-SCOP) 1 MG/3DAYS Place 1 patch (1.5 mg total) onto the skin every 3 (three) days. (Patient not taking: Reported on 01/31/2024) 10 patch 2   No current facility-administered medications for this encounter.    Physical Findings: The patient is in no acute distress. Patient is alert and oriented. Wt Readings from Last 3 Encounters:  02/21/24 141 lb 2 oz (64 kg)  02/14/24 143 lb 14.4 oz (65.3 kg)  01/25/24 155 lb (70.3 kg)    height is 5\' 8"  (1.727 m) and weight is 141 lb 2 oz (64 kg). His temporal temperature is 97.5 F (36.4 C) (abnormal). His blood pressure is 108/79 and his pulse is 105 (abnormal).  His respiration is 18 and oxygen saturation is 98%. .  General: Alert and oriented, in no acute distress HEENT: Head is normocephalic. Extraocular movements are intact. Oropharynx is notable for no concerning lesions. Tongue is midline. Neck: Neck is notable for no concerning adenopathy.  Skin: Skin in treatment fields shows satisfactory healing .  Heart: Regular in rate and rhythm with no murmurs, rubs, or gallops. Chest: Clear to auscultation bilaterally, with no rhonchi, wheezes, or rales. Abdomen: bandages in place from recent surgery. PEG tube. Extremities: No cyanosis or edema. Lymphatics: see Neck Exam Psychiatric: Judgment and insight are intact. Affect is appropriate.   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 EXAM: NUCLEAR MEDICINE PET SKULL BASE TO THIGH TECHNIQUE: 7.4 mCi F-18 FDG was injected intravenously. Full-ring PET imaging was performed from the skull base to thigh after the radiotracer. CT data was obtained and used for attenuation correction and anatomic localization. Fasting blood glucose: 86 mg/dl COMPARISON:  CT 29/52/8413 outside PET-CT scan 09/09/2023 chest FINDINGS: NECK: Resolution of the asymmetric hypermetabolic tissue in the LEFT tonsil seen on comparison PET-CT scan. No residual asymmetric are hypermetabolic tissue in the oropharynx or hypopharynx. No hypermetabolic cervical lymph nodes. Linear metabolic activity in the RIGHT level 2 nodal station (image 38) is favored related to IJ thrombosis rather than lymph node. Incidental CT findings: None. CHEST: There is mild metabolic activity within the proximal  esophagus through the thoracic inlet (image 151). No hypermetabolic mediastinal lymph nodes. Symmetric mild hilar metabolic activity. No suspicious pulmonary nodules. Incidental CT findings: Port in the anterior chest wall with tip in distal SVC. ABDOMEN/PELVIS: No abnormal metabolic activity within the liver. Mild activity associated with the midline ventral open wound (image 135 ). This is favored inflammatory. There is evidence of bowel resections with anastomoses in the mid abdomen and pelvis. There is a focus intense metabolic activity associated with the bowel in the lateral RIGHT mid abdomen just below the liver. This activity is round/rim like and intense with SUV max equal 12.5 on image 119. No clear lesion on the noncontrast CT. There is significant streak artifact from adjacent internal fixation of the LEFT arm. No abnormal activity at this site on comparison PET-CT scan. On comparison CT scan from 02/07/2024, there potential fluid collections at this site. Concern for abscess formation in the RIGHT abdomen. Incidental CT findings: Percutaneous gastrostomy tube in expected location the stomach. Drainage catheter in the upper pelvis with no identifiable fluid collection. SKELETON: No focal hypermetabolic activity to suggest skeletal metastasis. IMPRESSION: 1. No evidence of residual carcinoma in the oropharynx or hypopharynx. 2. No hypermetabolic cervical lymph nodes. 3. 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 4. New rounded intense radiotracer activity in the lateral RIGHT abdomen adjacent to the bowel and liver. Favor abscess formation related to recent bowel surgery. Postsurgical fluid collections noted on CT 02/07/2024. Consider contrast CT of the abdomen pelvis for further evaluation. 5. Drainage catheter in the upper pelvis at site of prior fluid collection. 6. Midline metabolic activity associated open wound consistent  with inflammation or infection. These results will be called to the ordering clinician or representative by the Radiologist Assistant, and communication documented in the PACS or [employer]. Electronically Signed   By: Genevive Bi M.D.   On: 02/20/2024 18:26   CT SOFT TISSUE NECK W CONTRAST Result Date: 02/10/2024 CLINICAL DATA:  Right IJ thrombus EXAM: CT NECK WITH CONTRAST TECHNIQUE: Multidetector CT imaging of the neck was performed using the standard protocol following the bolus administration of intravenous contrast. RADIATION DOSE REDUCTION: This exam was performed according to the departmental dose-optimization program which includes automated exposure control, adjustment of the mA and/or kV according to patient size and/or use of iterative reconstruction technique. CONTRAST:  75mL OMNIPAQUE IOHEXOL 300 MG/ML  SOLN COMPARISON:  11/18/2023 FINDINGS: Pharynx and larynx: Small retropharyngeal effusion. Normal epiglottis. There is  mild soft tissue asymmetry in the left vallecula. No discrete mass visualized. Salivary glands: No inflammation, mass, or stone. Thyroid: Normal. Lymph nodes: None enlarged or abnormal density. 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 Limited intracranial: Negative. Visualized orbits: Negative. Mastoids and visualized paranasal sinuses: Clear. Skeleton: No acute or aggressive process. Upper chest: Negative. Other: None. IMPRESSION: 1. 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 2. Small retropharyngeal effusion, likely reactive. 3. Mild soft tissue asymmetry in the left vallecula without discrete mass visualized. Direct visualization is recommended. Electronically Signed   By: Deatra Robinson M.D.   On: 02/10/2024 19:35   CT GUIDED PERITONEAL/RETROPERITONEAL FLUID DRAIN BY PERC CATH Result Date: 02/09/2024 INDICATION: 43 year old male with history of postoperative abdominopelvic fluid collection concerning for abscess.  EXAM: CT PERC DRAIN PERITONEAL ABCESS COMPARISON:  02/07/2024 MEDICATIONS: The patient is currently admitted to the hospital and receiving intravenous antibiotics. The antibiotics were administered within an appropriate time frame prior to the initiation of the procedure. ANESTHESIA/SEDATION: Moderate (conscious) sedation was employed during this procedure. A total of Versed 2 mg and Fentanyl 100 mcg was administered intravenously. Moderate Sedation Time: 10 minutes. The patient's level of consciousness and vital signs were monitored continuously by radiology nursing throughout the procedure under my direct supervision. CONTRAST:  None COMPLICATIONS: None immediate. PROCEDURE: RADIATION DOSE REDUCTION: This exam was performed according to the departmental dose-optimization program which includes automated exposure control, adjustment of the mA and/or kV according to patient size and/or use of iterative reconstruction technique. Informed written consent was obtained from the patient after a discussion of the risks, benefits and alternatives to treatment. The patient was placed supine on the CT gantry and a pre procedural CT was performed re-demonstrating the known abscess/fluid collection within the anterior pelvis. The procedure was planned. A timeout was performed prior to the initiation of the procedure. The lower abdomen was prepped and draped in the usual sterile fashion. The overlying soft tissues were anesthetized with 1% lidocaine with epinephrine. Appropriate trajectory was planned with the use of a 22 gauge spinal needle. An 18 gauge trocar needle was advanced into the abscess/fluid collection and a short Amplatz super stiff wire was coiled within the collection. Appropriate positioning was confirmed with a limited CT scan. The tract was serially dilated allowing placement of a 10 [place] all-purpose drainage catheter. Appropriate positioning was confirmed with a limited postprocedural CT scan. Proximally  100 ml of purulent fluid was aspirated. The tube was connected to a bulb suction and sutured in place. A dressing was placed. The patient tolerated the procedure well without immediate post procedural complication. IMPRESSION: Successful CT guided placement of a 10 French all purpose drain catheter into the anterior pelvic fluid collection with aspiration of 100 mL of purulent fluid. Samples were sent to the laboratory as requested by the ordering clinical team. Marliss Coots, MD [facility] Radiology Electronically Signed   By: Marliss Coots M.D.   On: 02/09/2024 13:43   DG ABDOMEN PEG TUBE LOCATION Result Date: 02/08/2024 CLINICAL DATA:  Gastrostomy status. EXAM: ABDOMEN - 1 VIEW COMPARISON:  Abdominal CT yesterday FINDINGS: Supine view of the abdomen obtained after the installation of enteric contrast through indwelling gastrostomy tube contrast is within the stomach. There is no evidence of extravasation or leak. Few air-filled prominent loops of small bowel centrally. IMPRESSION: Gastrostomy tube within the stomach. No extravasation or leak. Electronically Signed   By: Narda Rutherford M.D.   On: 02/08/2024  22:23   IR Replc Gastro/Colonic Tube Percut W/Fluoro Result Date: 02/08/2024 INDICATION: Patient with history of tonsillar cancer, dysphagia, prior 31 French balloon retention gastrostomy tube placement in October of last year; now with broken hub/leaking. Request received for gastrostomy tube exchange. EXAM: GASTROSTOMY TUBE EXCHANGE MEDICATIONS: None ANESTHESIA/SEDATION: None CONTRAST:  Abdominal film with contrast pending FLUOROSCOPY: Abdominal film pending COMPLICATIONS: None immediate. PROCEDURE: Informed consent was obtained from the patient/spouse after a thorough discussion of the procedural risks, benefits and alternatives. All questions were addressed. A timeout was performed prior to the initiation of the procedure. Bandage and bag drain was  removed from gastrostomy tube; 6 cc saline was removed from retention balloon and using manual traction the tube was removed in its entirety without immediate complications. A new 16 french entuit balloon retention gastrostomy tube was then inserted, balloon filled with 7 cc saline. Tube was flushed, secured to skin and split gauze dressing applied to site. IMPRESSION: Successful exchange for a new 16 Fr balloon-retention gastrostomy tube. Performed by: Artemio Aly RECOMMENDATIONS: The patient will return to Vascular Interventional Radiology (VIR) for routine feeding tube evaluation and exchange in 6 months. Roanna Banning, MD [facility] Radiology Electronically Signed   By: Roanna Banning M.D.   On: 02/08/2024 16:42   CT ABDOMEN PELVIS W CONTRAST Result Date: 02/07/2024 CLINICAL DATA:  Postoperative abdominal pain EXAM: CT ABDOMEN AND PELVIS WITH CONTRAST TECHNIQUE: Multidetector CT imaging of the abdomen and pelvis was performed using the standard protocol following bolus administration of intravenous contrast. RADIATION DOSE REDUCTION: This exam was performed according to the departmental dose-optimization program which includes automated exposure control, adjustment of the mA and/or kV according to patient size and/or use of iterative reconstruction technique. CONTRAST:  OMNIPAQUE IOHEXOL 300 MG/ML SOLN, 30mL OMNIPAQUE IOHEXOL 300 MG/ML SOLN COMPARISON:  01/30/2024 FINDINGS: Lower chest: Bibasilar atelectasis. Central venous catheter tip noted within the superior right atrium. No acute abnormality. Hepatobiliary: No focal liver abnormality is seen. No gallstones, gallbladder wall thickening, or biliary dilatation. Pancreas: Unremarkable Spleen: Unremarkable Adrenals/Urinary Tract: Adrenal glands are unremarkable. Kidneys are normal, without renal calculi, focal lesion, or hydronephrosis. Bladder is unremarkable. Stomach/Bowel: Surgical changes of  probable subtotal colectomy are again identified. There has been interval partial small bowel resection with anastomotic staple line seen within the mid abdomen. Punctate foci of free intraperitoneal gas or likely postoperative in nature. There is infiltration of the omentum which is nonspecific in the immediate postoperative setting. Similarly, peritoneal enhancement may relate to peritoneal inflammation or simply represent postoperative change. There are multiple loculated fluid collections within the abdomen demonstrating some degree of organization and rim enhancement: Right upper quadrant, axial image # 35/2, 5.7 x 3.2 cm. Left mid abdominal mesenteric folds, axial image # 50/2, 3.0 x 5.0 cm Right lower quadrant, axial image # 62/2, 1.5 x 4.6 cm Pelvis, axial image # 71/2, 9.4 x 12.8 x 6.6 cm (volume = 420 cm^3) There is tethering of bowel in the region of the small bowel resection, best appreciated on coronal image # 30/6 with areas of fat necrosis in this region, best appreciated on image # 31/6. Several relatively narrowed loops of bowel, however, suggest an underlying partial small bowel obstruction. Vascular/Lymphatic: No significant vascular findings are present. No enlarged abdominal or pelvic lymph nodes. Reproductive: Prostate is unremarkable. Other: Mild diffuse subcutaneous body wall edema. Musculoskeletal: Bilateral L5 pars defects are present with grade 1 anterolisthesis L5-S1. No acute bone abnormality. IMPRESSION: 1. Interval partial small bowel  resection with anastomotic staple line seen within the mid abdomen. Punctate foci of free intraperitoneal gas are likely postoperative in nature. 2. Multiple loculated fluid collections within the abdomen and pelvis demonstrating some degree of organization and rim enhancement, the largest within the pelvis measuring up to 12.8 cm in greatest dimension (volume = 420 cc). These may represent postoperative seromas, contain bowel leak, or abscesses. 3.  Tethering of bowel in the region of the small bowel resection with areas of fat necrosis in this region. Several relatively narrowed loops of bowel, however, suggest an underlying partial small bowel obstruction. 4. Infiltration of the omentum and peritoneal enhancement which may relate to peritoneal inflammation or simply represent postoperative change. 5. Bilateral L5 pars defects with grade 1 anterolisthesis L5-S1. 6. Mild diffuse subcutaneous body wall edema. Electronically Signed   By: Helyn Numbers M.D.   On: 02/07/2024 22:24   DG CHEST PORT 1 VIEW Result Date: 02/06/2024 CLINICAL DATA:  Fevers EXAM: PORTABLE CHEST 1 VIEW COMPARISON:  11/01/2023 FINDINGS: Cardiac shadow is within normal limits. Lungs are well aerated bilaterally. Right chest wall port is again seen. No acute bony abnormality is noted. IMPRESSION: No active disease. Electronically Signed   By: Alcide Clever M.D.   On: 02/06/2024 10:20   DG Abd 1 View Result Date: 02/04/2024 CLINICAL DATA:  Abdominal pain.  Constipation. EXAM: ABDOMEN - 1 VIEW COMPARISON:  February 01, 2024.  January 30, 2024. FINDINGS: The bowel gas pattern is normal. Probable right renal calculus is noted. Phleboliths are seen in right pelvis. IMPRESSION: No abnormal bowel dilatation.  Possible right renal calculus. Electronically Signed   By: Lupita Raider M.D.   On: 02/04/2024 14:18   [name] EKG SITE RITE Result Date: 02/04/2024 If Site Rite image not attached, placement could not be confirmed due to current cardiac rhythm.  DG Abd Portable 1V Result Date: 02/01/2024 CLINICAL DATA:  Small bowel obstruction. EXAM: PORTABLE ABDOMEN - 1 VIEW COMPARISON:  January 31, 2024.  January 30, 2024. FINDINGS: Continued small bowel dilatation is noted concerning for distal small bowel obstruction. Phleboliths are noted in the pelvis. IMPRESSION: Continued small bowel dilatation concerning for distal small bowel obstruction. Electronically Signed   By: Lupita Raider M.D.   On: 02/01/2024  11:30   DG Abd Portable 1V-Small Bowel Obstruction Protocol-initial, 8 hr delay Result Date: 01/31/2024 CLINICAL DATA:  Bowel obstruction 8 hour delay EXAM: PORTABLE ABDOMEN - 1 VIEW COMPARISON:  CT 01/30/2024 FINDINGS: Contrast within the urinary bladder. Severe small bowel distension up to 6.5 cm consistent with high-grade bowel obstruction, configuration similar to scout image from CT on 01/30/2024. Question dilute contrast within dilated left abdominal small bowel but definitely no colon contrast is visible IMPRESSION: Persistent dilatation of small bowel consistent with high-grade bowel obstruction. Question dilute contrast within dilated left abdominal small bowel but no colon contrast Electronically Signed   By: Jasmine Pang M.D.   On: 01/31/2024 23:04   CT ABDOMEN PELVIS W CONTRAST Result Date: 01/30/2024 CLINICAL DATA:  Abdominal pain.  Bowel obstruction suspected. EXAM: CT ABDOMEN AND PELVIS WITH CONTRAST TECHNIQUE: Multidetector CT imaging of the abdomen and pelvis was performed using the standard protocol following bolus administration of intravenous contrast. RADIATION DOSE REDUCTION: This exam was performed according to the departmental dose-optimization program which includes automated exposure control, adjustment of the mA and/or kV according to patient size and/or use of iterative reconstruction technique. CONTRAST:  OMNIPAQUE IOHEXOL 300 MG/ML  SOLN COMPARISON:  CT 01/26/2024 FINDINGS:  Lower chest: No acute abnormality. Hepatobiliary: No acute abnormality. Pancreas: Unremarkable. Spleen: Unremarkable. Adrenals/Urinary Tract: Within normal limits. Stomach/Bowel: Percutaneous gastrostomy tube in unchanged position. Postoperative changes of colectomy with ileoanal anastomosis. Diffuse dilation of the small bowel. There is an abrupt transition point in the right central abdomen (series 8/image [id number]). There is an adjacent transition point downstream from the original transition point with  dilation of the small bowel between the transition points (series 8/image 56). Findings are compatible with closed loop obstruction. No signs of ischemic bowel. Vascular/Lymphatic: No significant vascular findings are present. No enlarged abdominal or pelvic lymph nodes. Reproductive: Unremarkable. Other: Small volume free fluid in the pelvis. No free intraperitoneal air. Musculoskeletal: No acute fracture. 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 IMPRESSION: High-grade small bowel closed loop obstruction in the right hemiabdomen. No evidence of ischemia. Critical Value/emergent results were called by telephone at the time of interpretation on 01/30/2024 at 11:10 pm to provider Riki Sheer , who verbally acknowledged these results. Electronically Signed   By: Minerva Fester M.D.   On: 01/30/2024 23:11   CT ABDOMEN PELVIS W CONTRAST Result Date: 01/26/2024 CLINICAL DATA:  Abdominal pain EXAM: CT ABDOMEN AND PELVIS WITH CONTRAST TECHNIQUE: Multidetector CT imaging of the abdomen and pelvis was performed using the standard protocol following bolus administration of intravenous contrast. RADIATION DOSE REDUCTION: This exam was performed according to the departmental dose-optimization program which includes automated exposure control, adjustment of the mA and/or kV according to patient size and/or use of iterative reconstruction technique. CONTRAST:  OMNIPAQUE IOHEXOL 300 MG/ML  SOLN COMPARISON:  09/07/2023 FINDINGS: Lower chest: No acute abnormality. Hepatobiliary: No focal liver abnormality is seen. No gallstones, gallbladder wall thickening, or biliary dilatation. Pancreas: Unremarkable. No pancreatic ductal dilatation or surrounding inflammatory changes. Spleen: Normal in size without focal abnormality. Adrenals/Urinary Tract: Adrenal glands are within normal limits. Kidneys demonstrate a normal enhancement pattern bilaterally. No calculi or obstructive changes are seen. The bladder is  decompressed. Stomach/Bowel: Gastrostomy catheter is noted in place. No inflammatory changes surrounding the catheter are seen. 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 Ileo anal anastomosis is seen. Some mildly prominent loops of small bowel are noted in the mid abdomen. These are noted just proximal to the ileoanal anastomosis and relatively similar to that seen on the prior exam. This likely represents some chronic dilatation proximal to the anastomosis. Vascular/Lymphatic: No significant vascular findings are present. No enlarged abdominal or pelvic lymph nodes. Reproductive: Prostate is unremarkable. Other: No abdominal wall hernia or abnormality. No abdominopelvic ascites. 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 IMPRESSION: Changes consistent with prior colectomy with ileoanal anastomosis. Some dilated loops are noted proximal to this but chronic in nature. No other focal abnormality is noted. Electronically Signed   By: Alcide Clever M.D.   On: 01/26/2024 01:20    Impression/Plan:    1) Head and Neck Cancer Status: In remission - however, PET shows findings in the abdomen likely r/t recent bowel surgery.  Surgeon's office (Dr. Cliffton Asters) notified.  Pt knows to call surgeon's office tomorrow if they do not call him first.   2) Nutritional Status:  Wt Readings from Last 3 Encounters:  02/21/24 141 lb 2 oz (64 kg)  02/14/24 143 lb 14.4 oz (65.3 kg)  01/25/24 155 lb (70.3 kg)   Weight loss r/t recent bowel surgery, using PEG  3) Risk Factors: The patient has been educated about risk  factors including alcohol and tobacco abuse; they understand that avoidance of alcohol and tobacco is important to prevent recurrences as well as other cancers  4) Swallowing: functional, continues to see SLP.    5) Dental: Encouraged to continue regular followup with dentistry, and dental hygiene including fluoride rinses.    6) Thyroid function: Checking annually Lab Results  Component Value Date   TSH 1.209 12/15/2023    8) CT chest/ neck w/ contrast in 9 months  prior to rad/onc f/u. In the interim he will follow w/ Med onc and ENT.  Referral placed back to ENT at [facility].  The patient was encouraged to call with any issues or questions before then. Magic MW refilled today per pt request (mouth irritation).  On date of service, in total, I spent 40 minutes on this encounter. Patient was seen in person. -----------------------------------  [name], MD

## 2024-02-22 ENCOUNTER — Other Ambulatory Visit: Payer: Self-pay

## 2024-02-22 DIAGNOSIS — C090 Malignant neoplasm of tonsillar fossa: Secondary | ICD-10-CM

## 2024-02-24 ENCOUNTER — Inpatient Hospital Stay

## 2024-02-24 ENCOUNTER — Encounter: Payer: Self-pay | Admitting: Oncology

## 2024-02-24 ENCOUNTER — Inpatient Hospital Stay (HOSPITAL_BASED_OUTPATIENT_CLINIC_OR_DEPARTMENT_OTHER): Payer: BC Managed Care – PPO | Attending: Oncology | Admitting: Oncology

## 2024-02-24 ENCOUNTER — Inpatient Hospital Stay: Payer: BC Managed Care – PPO

## 2024-02-24 VITALS — BP 104/72 | HR 90 | Temp 97.9°F | Resp 17 | Ht 68.0 in | Wt 140.4 lb

## 2024-02-24 DIAGNOSIS — Z9049 Acquired absence of other specified parts of digestive tract: Secondary | ICD-10-CM | POA: Insufficient documentation

## 2024-02-24 DIAGNOSIS — Z7901 Long term (current) use of anticoagulants: Secondary | ICD-10-CM | POA: Diagnosis not present

## 2024-02-24 DIAGNOSIS — Z95828 Presence of other vascular implants and grafts: Secondary | ICD-10-CM

## 2024-02-24 DIAGNOSIS — Z923 Personal history of irradiation: Secondary | ICD-10-CM | POA: Insufficient documentation

## 2024-02-24 DIAGNOSIS — I82C21 Chronic embolism and thrombosis of right internal jugular vein: Secondary | ICD-10-CM | POA: Diagnosis not present

## 2024-02-24 DIAGNOSIS — D1391 Familial adenomatous polyposis: Secondary | ICD-10-CM | POA: Diagnosis not present

## 2024-02-24 DIAGNOSIS — I82C11 Acute embolism and thrombosis of right internal jugular vein: Secondary | ICD-10-CM

## 2024-02-24 DIAGNOSIS — C090 Malignant neoplasm of tonsillar fossa: Secondary | ICD-10-CM | POA: Insufficient documentation

## 2024-02-24 DIAGNOSIS — Z9221 Personal history of antineoplastic chemotherapy: Secondary | ICD-10-CM | POA: Insufficient documentation

## 2024-02-24 LAB — CBC WITH DIFFERENTIAL/PLATELET
Abs Immature Granulocytes: 0 10*3/uL (ref 0.00–0.07)
Basophils Absolute: 0 10*3/uL (ref 0.0–0.1)
Basophils Relative: 1 %
Eosinophils Absolute: 0.1 10*3/uL (ref 0.0–0.5)
Eosinophils Relative: 4 %
HCT: 34.7 % — ABNORMAL LOW (ref 39.0–52.0)
Hemoglobin: 11.3 g/dL — ABNORMAL LOW (ref 13.0–17.0)
Immature Granulocytes: 0 %
Lymphocytes Relative: 11 %
Lymphs Abs: 0.4 10*3/uL — ABNORMAL LOW (ref 0.7–4.0)
MCH: 29.7 pg (ref 26.0–34.0)
MCHC: 32.6 g/dL (ref 30.0–36.0)
MCV: 91.3 fL (ref 80.0–100.0)
Monocytes Absolute: 0.7 10*3/uL (ref 0.1–1.0)
Monocytes Relative: 19 %
Neutro Abs: 2.6 10*3/uL (ref 1.7–7.7)
Neutrophils Relative %: 65 %
Platelets: 346 10*3/uL (ref 150–400)
RBC: 3.8 MIL/uL — ABNORMAL LOW (ref 4.22–5.81)
RDW: 13.2 % (ref 11.5–15.5)
WBC: 3.9 10*3/uL — ABNORMAL LOW (ref 4.0–10.5)
nRBC: 0 % (ref 0.0–0.2)

## 2024-02-24 LAB — COMPREHENSIVE METABOLIC PANEL WITH GFR
ALT: 18 U/L (ref 0–44)
AST: 12 U/L — ABNORMAL LOW (ref 15–41)
Albumin: 4 g/dL (ref 3.5–5.0)
Alkaline Phosphatase: 143 U/L — ABNORMAL HIGH (ref 38–126)
Anion gap: 5 (ref 5–15)
BUN: 8 mg/dL (ref 6–20)
CO2: 31 mmol/L (ref 22–32)
Calcium: 9.9 mg/dL (ref 8.9–10.3)
Chloride: 102 mmol/L (ref 98–111)
Creatinine, Ser: 0.97 mg/dL (ref 0.61–1.24)
GFR, Estimated: >60 mL/min (ref >60)
Glucose, Bld: 97 mg/dL (ref 70–99)
Potassium: 4.3 mmol/L (ref 3.5–5.1)
Sodium: 138 mmol/L (ref 135–145)
Total Bilirubin: 0.4 mg/dL (ref 0.0–1.2)
Total Protein: 7.6 g/dL (ref 6.5–8.1)

## 2024-02-24 LAB — MAGNESIUM: Magnesium: 1.9 mg/dL (ref 1.7–2.4)

## 2024-02-24 LAB — D-DIMER, QUANTITATIVE: D-Dimer, Quant: 0.94 ug{FEU}/mL — ABNORMAL HIGH (ref 0.00–0.50)

## 2024-02-24 MED ORDER — HEPARIN SOD (PORK) LOCK FLUSH 100 UNIT/ML IV SOLN
500.0000 [IU] | Freq: Once | INTRAVENOUS | Status: DC
Start: 2024-02-24 — End: 2024-02-28

## 2024-02-24 MED ORDER — SODIUM CHLORIDE 0.9% FLUSH
10.0000 mL | Freq: Once | INTRAVENOUS | Status: DC
Start: 2024-02-24 — End: 2024-02-28

## 2024-02-24 NOTE — Assessment & Plan Note (Addendum)
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   - Previously discussed the benefits and risks of radiation alone vs combination of chemotherapy and radiation. Given the patient's age and the size of the lymph node which is borderline at 3 cm, the benefits of adding chemotherapy slightly outweigh the risks. After discussing side effect profile, patient did opt for weekly cisplatin.  Plan made to proceed with weekly cisplatin at 40 mg/m dose during the course of radiation with appropriate pre and post hydration and IV nausea medications.  -He started radiation treatments from 09/27/2023.  Started first dose of cisplatin on 09/29/2023.  -On the same day that he received first dose of cisplatin, he developed visual changes, described as a "3D" effect and double vision. Although very rare, optic neuritis has been reported with cisplatin use on literature review, especially when used in combination with other chemotherapeutic agents.  Though it is rare to happen with just 1 dose of cisplatin, given his symptoms, an ophthalmology referral was placed and we held Cisplatin on 10/05/23. Dr. Vanessa Barbara with ophthalmology evaluated patient on 10/06/2023.  No signs of optic neuritis. Patient was cleared to resume cisplatin.  - We resumed cisplatin from 10/14/2023 and continued this weekly during the course of radiation.  - He received total of 4 doses of cisplatin. He was hospitalized on 11/01/2023 for neutropenic fever, pain from mucositis.  ANC slowly improved.  Infectious workup was negative.  He was discharged home on 11/05/2023.  -Given issues with neutropenia and the fact that his mucositis was getting worse with chemotherapy, patient opted to not receive further chemotherapy. He completed radiation treatments on 11/16/2023.    -He is slowly recovering from treatment-related side effects.  Swallowing function has been improving.  No longer has mouth  sores.  Restaging PET scan on 02/20/2024 showed no evidence of residual carcinoma in the oropharynx or hypopharynx.  No hypermetabolic cervical lymph nodes.  Incidentally noted was new rounded intense radiotracer activity in the lateral right abdomen adjacent to bowel and liver, favoring abscess formation related to recent bowel surgery.  Patient has CT abdomen and pelvis for further evaluation scheduled on 02/27/2024 followed by appointment with Dr. Cliffton Asters, surgeon.  Will continue surveillance as per NCCN guidelines for his tonsillar cancer.  RTC in 3 months for follow-up with repeat labs.  He will see ENT at [facility] in the interim for endoscopic exam.

## 2024-02-24 NOTE — Assessment & Plan Note (Addendum)
-  Underwent total colectomy in 2016. Currently experiencing watery bowel movements approximately seven times a day. -Now that he has completed treatments for his head and neck cancer, referral sent to genetic counselor for further evaluation.

## 2024-02-24 NOTE — Assessment & Plan Note (Addendum)
 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  Previously was on Lovenox and later switched to Eliquis.  Patient stopped the medication for a few weeks because of affordability issues.  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  He was started back on Eliquis 5 mg p.o. twice daily.  He is able to get the medication now.  Plan to reevaluate with CT neck prior to return visit in 3 months.  He was advised to continue Eliquis until that point.

## 2024-02-24 NOTE — Progress Notes (Signed)
 [facility]  ONCOLOGY CLINIC PROGRESS NOTE   Patient Care Team: [name], MD as PCP - General (Family Medicine) Noe Gens, MD as Referring Physician (Otolaryngology) [name], MD as Attending Physician (Radiation Oncology) [name], [name], RN as Oncology Nurse Navigator [name], MD as Consulting Physician (Oncology)  Date of visit: 02/24/2024   ASSESSMENT & PLAN:   Very pleasant 43 y.o. 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  Treated with concurrent chemoradiation with weekly cisplatin starting from 09/29/2023.  Received 4 doses of weekly cisplatin.  Rest of the chemo was held because of neutropenia and also per patient preference.  Malignant neoplasm of tonsillar fossa (HCC) - Recently diagnosed left tonsillar squamous cell carcinoma with a 3 cm lymph node. cT2,cN1,cM0, p16+.   - Previously discussed the benefits and risks of radiation alone vs combination of chemotherapy and radiation. Given the patient's age and the size of the lymph node which is borderline at 3 cm, the benefits of adding chemotherapy slightly outweigh the risks. After discussing side effect profile, patient did opt for weekly cisplatin.  Plan made to proceed with weekly cisplatin at 40 mg/m dose during the course of radiation with appropriate pre and post hydration and IV nausea medications.  -He started radiation treatments from 09/27/2023.  Started first dose of cisplatin on 09/29/2023.  -On the same day that he received first dose of cisplatin, he developed visual changes, described as a "3D" effect and double vision. Although very rare, optic neuritis has been reported with cisplatin use on literature review, especially when used in  combination with other chemotherapeutic agents.  Though it is rare to happen with just 1 dose of cisplatin, given his symptoms, an ophthalmology referral was placed and we held Cisplatin on 10/05/23. Dr. Vanessa Barbara with ophthalmology evaluated patient on 10/06/2023.  No signs of optic neuritis. Patient was cleared to resume cisplatin.  - We resumed cisplatin from 10/14/2023 and continued this weekly during the course of radiation.  - He received total of 4 doses of cisplatin. He was hospitalized on 11/01/2023 for neutropenic fever, pain from mucositis.  ANC slowly improved.  Infectious workup was negative.  He was discharged home on 11/05/2023.  -Given issues with neutropenia and the fact that his mucositis was getting worse with chemotherapy, patient opted to not receive further chemotherapy. He completed radiation treatments on 11/16/2023.    -He is slowly recovering from treatment-related side effects.  Swallowing function has been improving.  No longer has mouth sores.  Restaging PET scan on 02/20/2024 showed no evidence of residual carcinoma in the oropharynx or hypopharynx.  No hypermetabolic cervical lymph nodes.  Incidentally noted was new rounded intense radiotracer activity in the lateral right abdomen adjacent to bowel and liver, favoring abscess formation related to recent bowel surgery.  Patient has CT abdomen and pelvis for further evaluation scheduled on 02/27/2024 followed by appointment with Dr. Cliffton Asters, surgeon.  Will continue surveillance as per NCCN guidelines for his tonsillar cancer.  RTC in 3 months for follow-up with repeat labs.  He will see ENT at [facility] in the interim for endoscopic exam.  Internal jugular (IJ) vein thromboembolism, acute, right (HCC) Blood clot near the port site discovered in late December 2024.  Previously was on Lovenox and later switched to  Eliquis.  Patient stopped the medication for a few weeks because of affordability issues.  During recent  hospitalization, CT of the neck showed persistence of right IJ thrombus.  He was started back on Eliquis 5 mg p.o. twice daily.  He is able to get the medication now.  Plan to reevaluate with CT neck prior to return visit in 3 months.  He was advised to continue Eliquis until that point.  Familial adenomatous polyposis -Underwent total colectomy in 2016. Currently experiencing watery bowel movements approximately seven times a day. -Now that he has completed treatments for his head and neck cancer, referral sent to genetic counselor for further evaluation.   I reviewed lab results and outside records for this visit and discussed relevant results with the patient. Diagnosis, plan of care and treatment options were also discussed in detail with the patient. Opportunity provided to ask questions and answers provided to his apparent satisfaction. Provided instructions to call our clinic with any problems, questions or concerns prior to return visit. I recommended to continue follow-up with PCP and sub-specialists. He verbalized understanding and agreed with the plan.   NCCN guidelines have been consulted in the planning of this patient's care.  I provided 30 minutes of face-to-face time during this encounter and > 50% was spent counseling as documented under my assessment and plan.    [name], MD  02/24/2024 5:53 PM  [facility] [address] [place] [address] Dept: [phone] Dept Fax: [phone]    CHIEF COMPLAINT/ REASON FOR VISIT:   Recently diagnosed squamous of carcinoma of left tonsillar fossa, p16 positive.  Current Treatment: Received chemoradiation with weekly cisplatin, chemo started from 09/29/2023.  Chemotherapy complicated by neutropenia, neutropenic fever.  He received only 4 of the planned 7 cycles of cisplatin.  INTERVAL HISTORY:   Discussed the use of AI scribe software for clinical note transcription with the  patient, who gave verbal consent to proceed.   [facility] is here today for repeat clinical assessment.   He reports that he is healing from the procedure and making progress, but he is experiencing manageable abdominal pain. He denies fevers, chills, and night sweats. He has lost weight since the last visit, currently weighing 140 pounds, due to a lack of appetite and taste changes. 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 He is on Eliquis for the clot, which is costing around $100 per month, but he has found a coupon card to help with the cost.  I have reviewed the past medical history, past surgical history, social history and family history with the patient and they are unchanged from previous note.  ALLERGIES: He has no known allergies.  MEDICATIONS:  Current Outpatient Medications  Medication Sig Dispense Refill   apixaban (ELIQUIS) 5 MG TABS tablet Take 1 tablet (5 mg total) by mouth 2 (two) times daily. 60 tablet 3   celecoxib (CELEBREX) 200 MG capsule Place 1 capsule (200 mg total) into feeding tube daily as needed for moderate pain (pain score 4-6). 90 capsule 0   magic mouthwash w/lidocaine SOLN Take 10 mLs by mouth 4 (four) times daily as needed for mouth pain. Suspension contains equal amounts of Maalox Extra Strength, nystatin, diphenhydramine and lidocaine. 200 mL 3   methocarbamol (ROBAXIN) 500 MG tablet Take 2 tablets (1,000 mg total) by mouth every 6 (six) hours as needed for muscle spasms. 60 tablet 0   Nutritional Supplements (FEEDING SUPPLEMENT, OSMOLITE  1.5 CAL,) LIQD 7 cartons Osmolite 1.5 split over four feeding daily. Flush tube with 60 ml water before and after each bolus. Provide additional 3 1/2 cups (830 ml) water/day to meet hydration needs. Provides 2485 kcal, 104.3 g protein, 1267 ml free water from formula (2577 ml total water with flushes). 1659 ml/day meets 100% RDI     OLANZapine (ZYPREXA) 5 MG tablet Take 1 tablet (5 mg total) by mouth  at bedtime. 30 tablet 2   ondansetron (ZOFRAN-ODT) 8 MG disintegrating tablet 8mg  ODT q8hours prn nausea (Patient taking differently: Take 8 mg by mouth every 8 (eight) hours as needed for nausea or vomiting. 8mg  ODT q8hours prn nausea) 4 tablet 0   oxyCODONE (ROXICODONE) 5 MG/5ML solution Take 5-10 mLs (5-10 mg total) by mouth every 6 (six) hours as needed for moderate pain (pain score 4-6) or severe pain (pain score 7-10). 200 mL 0   PARoxetine (PAXIL CR) 12.5 MG 24 hr tablet Take 1 tablet (12.5 mg total) by mouth daily. 30 tablet 0   omeprazole (PRILOSEC) 20 MG capsule Take 20 mg by mouth See admin instructions. 20 mg per tube once a day PRN (Patient not taking: Reported on 02/24/2024)     No current facility-administered medications for this visit.   Facility-Administered Medications Ordered in Other Visits  Medication Dose Route Frequency Provider Last Rate Last Admin   heparin lock flush 100 unit/mL  500 Units Intracatheter Once Adriano Bischof, MD       sodium chloride flush (NS) 0.9 % injection 10 mL  10 mL Intracatheter Once Trebor Galdamez, MD        HISTORY OF PRESENT ILLNESS:   Oncology History  Malignant neoplasm of tonsillar fossa (HCC)  07/20/2023 Imaging   CT neck soft tissue: 2.5 x 3 cm mass in the left tonsillar region with extension towards the tongue base, suspicious for carcinoma, and evidence of level 2 lymphadenopathy on the left, consisting of a dominant node measuring 3 x 2 x 1.7 cm. A benign appearing osteoma was also demonstrated, projecting inferior from the angle of the left mandible.    08/26/2023 Initial Diagnosis   Malignant neoplasm of tonsillar fossa [facility]):  He presented to his PCP for routine follow-up in July 2024 and was incidentally noted to have left submandibular swelling on a routine examination performed at that time. A soft tissue head and neck ultrasound was subsequently performed on 07/07/23 which showed an ovoid mass measuring 2.8 x 1.9 cm, most  consistent with an abnormally enlarged lymph node, and correlating with the palpable area in the left submandibular region.    A soft tissue neck CT was also performed on 07/20/23 which demonstrated: a  2.5 x 3 cm mass in the left tonsillar region with extension towards the tongue base, suspicious for carcinoma, and evidence of level 2 lymphadenopathy on the left, consisting of a dominant node measuring 3 x 2 x 1.7 cm. A benign appearing osteoma was also demonstrated, projecting inferior from the angle of the left mandible.   Subsequently, the patient was referred to Dr. Roma Schanz at [facility] and underwent biopsies of the left tonsil on 08/26/23. 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    08/26/2023 Pathology Results   Patient was referred to Dr. Roma Schanz at [facility] and underwent biopsies of the left tonsil on 08/26/23. 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    09/07/2023 PET scan  PET scan performed on 09/07/23 at [facility]: intense radiotracer uptake within the left palatine tonsillar mass; adjacent radiotracer uptake within the region of the right palatine tonsil; and hypermetabolic nodes in the left neck extending to the supraclavicular region concerning for nodal metastases. Focal uptake at the anorectal junction was also demonstrated, possibly reflecting muscular contraction, however local malignancy can not be excluded. PET otherwise showed no evidence of more distant metastatic disease.    09/14/2023 Cancer Staging   Staging form: Pharynx - HPV-Mediated Oropharynx, AJCC 8th Edition - Clinical stage from 09/14/2023: Stage I (cT2, cN1, cM0, p16+) - Signed by [name], MD on 09/14/2023 Stage prefix: Initial diagnosis   09/29/2023 - 10/21/2023 Chemotherapy   Patient is on Treatment Plan : HEAD/NECK Cisplatin (40) q7d         REVIEW OF SYSTEMS:   Review of  Systems - Oncology  All other pertinent systems were reviewed with the patient and are negative.   VITALS:   Blood pressure 104/72, pulse 90, temperature 97.9 F (36.6 C), temperature source Temporal, resp. rate 17, height 5\' 8"  (1.727 m), weight 140 lb 6.4 oz (63.7 kg), SpO2 100%.  Wt Readings from Last 3 Encounters:  02/24/24 140 lb 6.4 oz (63.7 kg)  02/21/24 141 lb 2 oz (64 kg)  02/14/24 143 lb 14.4 oz (65.3 kg)    Body mass index is 21.35 kg/m.   Onc Performance Status - 02/24/24 1600       ECOG Perf Status   ECOG Perf Status Ambulatory and capable of all selfcare but unable to carry out any work activities.  Up and about more than 50% of waking hours      KPS SCALE   KPS % SCORE Cares for self, unable to carry on normal activity or to do active work             PHYSICAL EXAM:   Physical Exam Constitutional:      General: He is not in acute distress.    Appearance: Normal appearance.  HENT:     Head: Normocephalic and atraumatic.     Mouth/Throat:     Mouth: Mucous membranes are moist.     Comments: No evidence of erythema or thrush Eyes:     General: No scleral icterus.    Conjunctiva/sclera: Conjunctivae normal.  Cardiovascular:     Rate and Rhythm: Normal rate and regular rhythm.     Pulses: Normal pulses.     Heart sounds: Normal heart sounds.  Pulmonary:     Effort: Pulmonary effort is normal.     Breath sounds: Normal breath sounds.  Chest:     Comments: Right-sided Port-A-Cath in place without any signs of infection Abdominal:     General: There is no distension.  Musculoskeletal:     Right lower leg: No edema.     Left lower leg: No edema.  Lymphadenopathy:     Cervical: No cervical adenopathy.  Skin:    Findings: No rash.  Neurological:     General: No focal deficit present.     Mental Status: He is alert and oriented to person, place, and time.  Psychiatric:        Mood and Affect: Mood normal.        Behavior: Behavior normal.         Thought Content: Thought content normal.      LABORATORY DATA:   I have reviewed the data as listed.  Results for orders placed or performed in visit  on 02/24/24  D-dimer, quantitative  Result Value Ref Range   D-Dimer, Quant 0.94 (H) 0.00 - 0.50 ug/mL-FEU  Magnesium  Result Value Ref Range   Magnesium 1.9 1.7 - 2.4 mg/dL  Comprehensive metabolic panel  Result Value Ref Range   Sodium 138 135 - 145 mmol/L   Potassium 4.3 3.5 - 5.1 mmol/L   Chloride 102 98 - 111 mmol/L   CO2 31 22 - 32 mmol/L   Glucose, Bld 97 70 - 99 mg/dL   BUN 8 6 - 20 mg/dL   Creatinine, Ser 1.61 0.61 - 1.24 mg/dL   Calcium 9.9 8.9 - 09.6 mg/dL   Total Protein 7.6 6.5 - 8.1 g/dL   Albumin 4.0 3.5 - 5.0 g/dL   AST 12 (L) 15 - 41 U/L   ALT 18 0 - 44 U/L   Alkaline Phosphatase 143 (H) 38 - 126 U/L   Total Bilirubin 0.4 0.0 - 1.2 mg/dL   GFR, Estimated >04 >54 mL/min   Anion gap 5 5 - 15  CBC with Differential/Platelet  Result Value Ref Range   WBC 3.9 (L) 4.0 - 10.5 K/uL   RBC 3.80 (L) 4.22 - 5.81 MIL/uL   Hemoglobin 11.3 (L) 13.0 - 17.0 g/dL   HCT 09.8 (L) 11.9 - 14.7 %   MCV 91.3 80.0 - 100.0 fL   MCH 29.7 26.0 - 34.0 pg   MCHC 32.6 30.0 - 36.0 g/dL   RDW 82.9 56.2 - 13.0 %   Platelets 346 150 - 400 K/uL   nRBC 0.0 0.0 - 0.2 %   Neutrophils Relative % 65 %   Neutro Abs 2.6 1.7 - 7.7 K/uL   Lymphocytes Relative 11 %   Lymphs Abs 0.4 (L) 0.7 - 4.0 K/uL   Monocytes Relative 19 %   Monocytes Absolute 0.7 0.1 - 1.0 K/uL   Eosinophils Relative 4 %   Eosinophils Absolute 0.1 0.0 - 0.5 K/uL   Basophils Relative 1 %   Basophils Absolute 0.0 0.0 - 0.1 K/uL   Immature Granulocytes 0 %   Abs Immature Granulocytes 0.00 0.00 - 0.07 K/uL      RADIOGRAPHIC STUDIES:  I have personally reviewed the radiological images as listed and agree with the findings in the report.  NM PET Image Restag (PS) Skull Base To Thigh Result Date: 02/20/2024 CLINICAL DATA:  Subsequent treatment strategy for head neck  carcinoma. EXAM: NUCLEAR MEDICINE PET SKULL BASE TO THIGH TECHNIQUE: 7.4 mCi F-18 FDG was injected intravenously. Full-ring PET imaging was performed from the skull base to thigh after the radiotracer. CT data was obtained and used for attenuation correction and anatomic localization. Fasting blood glucose: 86 mg/dl COMPARISON:  CT 86/57/8469 outside PET-CT scan 09/09/2023 chest FINDINGS: NECK: Resolution of the asymmetric hypermetabolic tissue in the LEFT tonsil seen on comparison PET-CT scan. No residual asymmetric are hypermetabolic tissue in the oropharynx or hypopharynx. No hypermetabolic cervical lymph nodes. Linear metabolic activity in the RIGHT level 2 nodal station (image 38) is favored related to IJ thrombosis rather than lymph node. Incidental CT findings: None. CHEST: There is mild metabolic activity within the proximal esophagus through the thoracic inlet (image 151). No hypermetabolic mediastinal lymph nodes. Symmetric mild hilar metabolic activity. No suspicious pulmonary nodules. Incidental CT findings: Port in the anterior chest wall with tip in distal SVC. ABDOMEN/PELVIS: No abnormal metabolic activity within the liver. Mild activity associated with the midline ventral open wound (image 135 ). This is favored inflammatory. There is evidence of  bowel resections with anastomoses in the mid abdomen and pelvis. There is a focus intense metabolic activity associated with the bowel in the lateral RIGHT mid abdomen just below the liver. This activity is round/rim like and intense with SUV max equal 12.5 on image 119. No clear lesion on the noncontrast CT. There is significant streak artifact from adjacent internal fixation of the LEFT arm. No abnormal activity at this site on comparison PET-CT scan. On comparison CT scan from 02/07/2024, there potential fluid collections at this site. Concern for abscess formation in the RIGHT abdomen. Incidental CT findings: Percutaneous gastrostomy tube in expected  location the stomach. Drainage catheter in the upper pelvis with no identifiable fluid collection. SKELETON: No focal hypermetabolic activity to suggest skeletal metastasis. IMPRESSION: 1. No evidence of residual carcinoma in the oropharynx or hypopharynx. 2. No hypermetabolic cervical lymph nodes. 3. 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 4. New rounded intense radiotracer activity in the lateral RIGHT abdomen adjacent to the bowel and liver. Favor abscess formation related to recent bowel surgery. Postsurgical fluid collections noted on CT 02/07/2024. Consider contrast CT of the abdomen pelvis for further evaluation. 5. Drainage catheter in the upper pelvis at site of prior fluid collection. 6. Midline metabolic activity associated open wound consistent with inflammation or infection. These results will be called to the ordering clinician or representative by the Radiologist Assistant, and communication documented in the PACS or [employer]. Electronically Signed   By: Genevive Bi M.D.   On: 02/20/2024 18:26   CT SOFT TISSUE NECK W CONTRAST Result Date: 02/10/2024 CLINICAL DATA:  Right IJ thrombus EXAM: CT NECK WITH CONTRAST TECHNIQUE: Multidetector CT imaging of the neck was performed using the standard protocol following the bolus administration of intravenous contrast. RADIATION DOSE REDUCTION: This exam was performed according to the departmental dose-optimization program which includes automated exposure control, adjustment of the mA and/or kV according to patient size and/or use of iterative reconstruction technique. CONTRAST:  75mL OMNIPAQUE IOHEXOL 300 MG/ML  SOLN COMPARISON:  11/18/2023 FINDINGS: Pharynx and larynx: Small retropharyngeal effusion. Normal epiglottis. There is mild soft tissue asymmetry in the left vallecula. No discrete mass visualized. Salivary glands: No inflammation, mass, or stone. Thyroid:  Normal. Lymph nodes: None enlarged or abnormal density. 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 Limited intracranial: Negative. Visualized orbits: Negative. Mastoids and visualized paranasal sinuses: Clear. Skeleton: No acute or aggressive process. Upper chest: Negative. Other: None. IMPRESSION: 1. 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 2. Small retropharyngeal effusion, likely reactive. 3. Mild soft tissue asymmetry in the left vallecula without discrete mass visualized. Direct visualization is recommended. Electronically Signed   By: Deatra Robinson M.D.   On: 02/10/2024 19:35   CT GUIDED PERITONEAL/RETROPERITONEAL FLUID DRAIN BY PERC CATH Result Date: 02/09/2024 INDICATION: 43 year old male with history of postoperative abdominopelvic fluid collection concerning for abscess. EXAM: CT PERC DRAIN PERITONEAL ABCESS COMPARISON:  02/07/2024 MEDICATIONS: The patient is currently admitted to the hospital and receiving intravenous antibiotics. The antibiotics were administered within an appropriate time frame prior to the initiation of the procedure. ANESTHESIA/SEDATION: Moderate (conscious) sedation was employed during this procedure. A total of Versed 2 mg and Fentanyl 100 mcg was administered intravenously. Moderate Sedation Time: 10 minutes. The patient's level of consciousness and vital signs were monitored continuously by radiology nursing throughout the procedure under my direct supervision. CONTRAST:  None COMPLICATIONS: None immediate. PROCEDURE: RADIATION DOSE  REDUCTION: This exam was performed according to the departmental dose-optimization program which includes automated exposure control, adjustment of the mA and/or kV according to patient size and/or use of iterative reconstruction technique. Informed written consent was obtained from the patient after a discussion of the risks, benefits and alternatives to treatment. The patient was placed supine on  the CT gantry and a pre procedural CT was performed re-demonstrating the known abscess/fluid collection within the anterior pelvis. The procedure was planned. A timeout was performed prior to the initiation of the procedure. The lower abdomen was prepped and draped in the usual sterile fashion. The overlying soft tissues were anesthetized with 1% lidocaine with epinephrine. Appropriate trajectory was planned with the use of a 22 gauge spinal needle. An 18 gauge trocar needle was advanced into the abscess/fluid collection and a short Amplatz super stiff wire was coiled within the collection. Appropriate positioning was confirmed with a limited CT scan. The tract was serially dilated allowing placement of a 10 [place] all-purpose drainage catheter. Appropriate positioning was confirmed with a limited postprocedural CT scan. Proximally 100 ml of purulent fluid was aspirated. The tube was connected to a bulb suction and sutured in place. A dressing was placed. The patient tolerated the procedure well without immediate post procedural complication. IMPRESSION: Successful CT guided placement of a 10 French all purpose drain catheter into the anterior pelvic fluid collection with aspiration of 100 mL of purulent fluid. Samples were sent to the laboratory as requested by the ordering clinical team. Marliss Coots, MD [facility] Electronically Signed   By: Marliss Coots M.D.   On: 02/09/2024 13:43   DG ABDOMEN PEG TUBE LOCATION Result Date: 02/08/2024 CLINICAL DATA:  Gastrostomy status. EXAM: ABDOMEN - 1 VIEW COMPARISON:  Abdominal CT yesterday FINDINGS: Supine view of the abdomen obtained after the installation of enteric contrast through indwelling gastrostomy tube contrast is within the stomach. There is no evidence of extravasation or leak. Few air-filled prominent loops of small bowel centrally. IMPRESSION: Gastrostomy tube within the stomach. No extravasation  or leak. Electronically Signed   By: Narda Rutherford M.D.   On: 02/08/2024 22:23   IR Replc Gastro/Colonic Tube Percut W/Fluoro Result Date: 02/08/2024 INDICATION: Patient with history of tonsillar cancer, dysphagia, prior 84 French balloon retention gastrostomy tube placement in October of last year; now with broken hub/leaking. Request received for gastrostomy tube exchange. EXAM: GASTROSTOMY TUBE EXCHANGE MEDICATIONS: None ANESTHESIA/SEDATION: None CONTRAST:  Abdominal film with contrast pending FLUOROSCOPY: Abdominal film pending COMPLICATIONS: None immediate. PROCEDURE: Informed consent was obtained from the patient/spouse after a thorough discussion of the procedural risks, benefits and alternatives. All questions were addressed. A timeout was performed prior to the initiation of the procedure. Bandage and bag drain was removed from gastrostomy tube; 6 cc saline was removed from retention balloon and using manual traction the tube was removed in its entirety without immediate complications. A new 16 french entuit balloon retention gastrostomy tube was then inserted, balloon filled with 7 cc saline. Tube was flushed, secured to skin and split gauze dressing applied to site. IMPRESSION: Successful exchange for a new 16 Fr balloon-retention gastrostomy tube. Performed by: Artemio Aly RECOMMENDATIONS: The patient will return to Vascular Interventional Radiology (VIR) for routine feeding tube evaluation and exchange in 6 months. Roanna Banning, MD [facility] Radiology Electronically Signed   By: Roanna Banning M.D.   On: 02/08/2024 16:42   CT ABDOMEN PELVIS W CONTRAST Result Date: 02/07/2024  CLINICAL DATA:  Postoperative abdominal pain EXAM: CT ABDOMEN AND PELVIS WITH CONTRAST TECHNIQUE: Multidetector CT imaging of the abdomen and pelvis was performed using the standard protocol following bolus administration of intravenous contrast. RADIATION DOSE  REDUCTION: This exam was performed according to the departmental dose-optimization program which includes automated exposure control, adjustment of the mA and/or kV according to patient size and/or use of iterative reconstruction technique. CONTRAST:  OMNIPAQUE IOHEXOL 300 MG/ML SOLN, 30mL OMNIPAQUE IOHEXOL 300 MG/ML SOLN COMPARISON:  01/30/2024 FINDINGS: Lower chest: Bibasilar atelectasis. Central venous catheter tip noted within the superior right atrium. No acute abnormality. Hepatobiliary: No focal liver abnormality is seen. No gallstones, gallbladder wall thickening, or biliary dilatation. Pancreas: Unremarkable Spleen: Unremarkable Adrenals/Urinary Tract: Adrenal glands are unremarkable. Kidneys are normal, without renal calculi, focal lesion, or hydronephrosis. Bladder is unremarkable. Stomach/Bowel: Surgical changes of probable subtotal colectomy are again identified. There has been interval partial small bowel resection with anastomotic staple line seen within the mid abdomen. Punctate foci of free intraperitoneal gas or likely postoperative in nature. There is infiltration of the omentum which is nonspecific in the immediate postoperative setting. Similarly, peritoneal enhancement may relate to peritoneal inflammation or simply represent postoperative change. There are multiple loculated fluid collections within the abdomen demonstrating some degree of organization and rim enhancement: Right upper quadrant, axial image # 35/2, 5.7 x 3.2 cm. Left mid abdominal mesenteric folds, axial image # 50/2, 3.0 x 5.0 cm Right lower quadrant, axial image # 62/2, 1.5 x 4.6 cm Pelvis, axial image # 71/2, 9.4 x 12.8 x 6.6 cm (volume = 420 cm^3) There is tethering of bowel in the region of the small bowel resection, best appreciated on coronal image # 30/6 with areas of fat necrosis in this region, best appreciated on image # 31/6. Several relatively narrowed loops of bowel, however, suggest an underlying partial  small bowel obstruction. Vascular/Lymphatic: No significant vascular findings are present. No enlarged abdominal or pelvic lymph nodes. Reproductive: Prostate is unremarkable. Other: Mild diffuse subcutaneous body wall edema. Musculoskeletal: Bilateral L5 pars defects are present with grade 1 anterolisthesis L5-S1. No acute bone abnormality. IMPRESSION: 1. Interval partial small bowel resection with anastomotic staple line seen within the mid abdomen. Punctate foci of free intraperitoneal gas are likely postoperative in nature. 2. Multiple loculated fluid collections within the abdomen and pelvis demonstrating some degree of organization and rim enhancement, the largest within the pelvis measuring up to 12.8 cm in greatest dimension (volume = 420 cc). These may represent postoperative seromas, contain bowel leak, or abscesses. 3. Tethering of bowel in the region of the small bowel resection with areas of fat necrosis in this region. Several relatively narrowed loops of bowel, however, suggest an underlying partial small bowel obstruction. 4. Infiltration of the omentum and peritoneal enhancement which may relate to peritoneal inflammation or simply represent postoperative change. 5. Bilateral L5 pars defects with grade 1 anterolisthesis L5-S1. 6. Mild diffuse subcutaneous body wall edema. Electronically Signed   By: Helyn Numbers M.D.   On: 02/07/2024 22:24   DG CHEST PORT 1 VIEW Result Date: 02/06/2024 CLINICAL DATA:  Fevers EXAM: PORTABLE CHEST 1 VIEW COMPARISON:  11/01/2023 FINDINGS: Cardiac shadow is within normal limits. Lungs are well aerated bilaterally. Right chest wall port is again seen. No acute bony abnormality is noted. IMPRESSION: No active disease. Electronically Signed   By: Alcide Clever M.D.   On: 02/06/2024 10:20   DG Abd 1 View Result Date: 02/04/2024 CLINICAL DATA:  Abdominal pain.  Constipation. EXAM: ABDOMEN - 1 VIEW COMPARISON:  February 01, 2024.  January 30, 2024. FINDINGS: The bowel gas  pattern is normal. Probable right renal calculus is noted. Phleboliths are seen in right pelvis. IMPRESSION: No abnormal bowel dilatation.  Possible right renal calculus. Electronically Signed   By: Lupita Raider M.D.   On: 02/04/2024 14:18   [name] EKG SITE RITE Result Date: 02/04/2024 If Site Rite image not attached, placement could not be confirmed due to current cardiac rhythm.  DG Abd Portable 1V Result Date: 02/01/2024 CLINICAL DATA:  Small bowel obstruction. EXAM: PORTABLE ABDOMEN - 1 VIEW COMPARISON:  January 31, 2024.  January 30, 2024. FINDINGS: Continued small bowel dilatation is noted concerning for distal small bowel obstruction. Phleboliths are noted in the pelvis. IMPRESSION: Continued small bowel dilatation concerning for distal small bowel obstruction. Electronically Signed   By: Lupita Raider M.D.   On: 02/01/2024 11:30   DG Abd Portable 1V-Small Bowel Obstruction Protocol-initial, 8 hr delay Result Date: 01/31/2024 CLINICAL DATA:  Bowel obstruction 8 hour delay EXAM: PORTABLE ABDOMEN - 1 VIEW COMPARISON:  CT 01/30/2024 FINDINGS: Contrast within the urinary bladder. Severe small bowel distension up to 6.5 cm consistent with high-grade bowel obstruction, configuration similar to scout image from CT on 01/30/2024. Question dilute contrast within dilated left abdominal small bowel but definitely no colon contrast is visible IMPRESSION: Persistent dilatation of small bowel consistent with high-grade bowel obstruction. Question dilute contrast within dilated left abdominal small bowel but no colon contrast Electronically Signed   By: Jasmine Pang M.D.   On: 01/31/2024 23:04   CT ABDOMEN PELVIS W CONTRAST Result Date: 01/30/2024 CLINICAL DATA:  Abdominal pain.  Bowel obstruction suspected. EXAM: CT ABDOMEN AND PELVIS WITH CONTRAST TECHNIQUE: Multidetector CT imaging of the abdomen and pelvis was performed using the standard protocol following bolus administration of intravenous contrast. RADIATION  DOSE REDUCTION: This exam was performed according to the departmental dose-optimization program which includes automated exposure control, adjustment of the mA and/or kV according to patient size and/or use of iterative reconstruction technique. CONTRAST:  OMNIPAQUE IOHEXOL 300 MG/ML  SOLN COMPARISON:  CT 01/26/2024 FINDINGS: Lower chest: No acute abnormality. Hepatobiliary: No acute abnormality. Pancreas: Unremarkable. Spleen: Unremarkable. Adrenals/Urinary Tract: Within normal limits. Stomach/Bowel: Percutaneous gastrostomy tube in unchanged position. Postoperative changes of colectomy with ileoanal anastomosis. Diffuse dilation of the small bowel. There is an abrupt transition point in the right central abdomen (series 8/image 54-67). There is an adjacent transition point downstream from the original transition point with dilation of the small bowel between the transition points (series 8/image 56). Findings are compatible with closed loop obstruction. No signs of ischemic bowel. Vascular/Lymphatic: No significant vascular findings are present. No enlarged abdominal or pelvic lymph nodes. Reproductive: Unremarkable. Other: Small volume free fluid in the pelvis. No free intraperitoneal air. Musculoskeletal: No acute fracture. 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 IMPRESSION: High-grade small bowel closed loop obstruction in the right hemiabdomen. No evidence of ischemia. Critical Value/emergent results were called by telephone at the time of interpretation on 01/30/2024 at 11:10 pm to provider Riki Sheer , who verbally acknowledged these results. Electronically Signed   By: Minerva Fester M.D.   On: 01/30/2024 23:11   CT ABDOMEN PELVIS W CONTRAST Result Date: 01/26/2024 CLINICAL DATA:  Abdominal pain EXAM: CT ABDOMEN AND PELVIS WITH CONTRAST TECHNIQUE: Multidetector CT imaging of the abdomen and pelvis was performed using the standard protocol following bolus administration of  intravenous contrast. RADIATION DOSE  REDUCTION: This exam was performed according to the departmental dose-optimization program which includes automated exposure control, adjustment of the mA and/or kV according to patient size and/or use of iterative reconstruction technique. CONTRAST:  OMNIPAQUE IOHEXOL 300 MG/ML  SOLN COMPARISON:  09/07/2023 FINDINGS: Lower chest: No acute abnormality. Hepatobiliary: No focal liver abnormality is seen. No gallstones, gallbladder wall thickening, or biliary dilatation. Pancreas: Unremarkable. No pancreatic ductal dilatation or surrounding inflammatory changes. Spleen: Normal in size without focal abnormality. Adrenals/Urinary Tract: Adrenal glands are within normal limits. Kidneys demonstrate a normal enhancement pattern bilaterally. No calculi or obstructive changes are seen. The bladder is decompressed. Stomach/Bowel: Gastrostomy catheter is noted in place. No inflammatory changes surrounding the catheter are seen. 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 Ileo anal anastomosis is seen. Some mildly prominent loops of small bowel are noted in the mid abdomen. These are noted just proximal to the ileoanal anastomosis and relatively similar to that seen on the prior exam. This likely represents some chronic dilatation proximal to the anastomosis. Vascular/Lymphatic: No significant vascular findings are present. No enlarged abdominal or pelvic lymph nodes. Reproductive: Prostate is unremarkable. Other: No abdominal wall hernia or abnormality. No abdominopelvic ascites. 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 IMPRESSION: Changes consistent with prior colectomy with ileoanal anastomosis. Some dilated loops are noted proximal to this but chronic in nature. No other focal abnormality is noted. Electronically Signed   By: Alcide Clever M.D.   On: 01/26/2024 01:20    CODE STATUS:  Code  Status History     Date Active Date Inactive Code Status Order ID Comments User Context   09/22/2023 1045 09/23/2023 0514 Full Code [id number]  Roanna Banning, MD [facility]   09/22/2023 1045 09/22/2023 1045 Full Code [id number]  Roanna Banning, MD HOV    Questions for Most Recent Historical Code Status (Order [id number])     Question Answer   By: Consent: discussion documented in EHR             Future Appointments  Date Time Provider Department Center  02/27/2024 11:20 AM [facility] 1 [facility]CT [facility]  02/27/2024 11:30 AM [facility] FLUORO 1 [facility]LBDG [facility]  02/27/2024 11:30 AM [facility] IR 1 [facility]-LBIR [facility]LB  03/05/2024 11:00 AM Barron Alvine, CCC-SLP [facility]  04/06/2024  3:45 PM [facility] None  05/18/2024  3:45 PM [facility] None  05/30/2024  2:00 PM [facility] None  05/30/2024  2:30 PM Creed Kail, MD [facility] None  06/20/2024  8:40 AM [name], MD [facility]  06/29/2024  3:45 PM [facility] FL[facility] None  11/06/2024 10:30 AM [facility] LAB [facility] None  11/06/2024 11:00 AM Erven Colla, PA-C [facility] None     This document was completed utilizing speech recognition software. Grammatical errors, random word insertions, pronoun errors, and incomplete sentences are an occasional consequence of this system due to software limitations, ambient noise, and hardware issues. Any formal questions or concerns about the content, text or information contained within the body of this dictation should be directly addressed to the provider for clarification.

## 2024-02-26 NOTE — Progress Notes (Signed)
 Referring Physician(s): Dr. Cliffton Asters  Chief Complaint: The patient is seen in follow up today s/p intra-abdominal fluid collection aspiration with drain placement 02/09/24   History of present illness: Harry Williams is a 43 y.o. male with past medical history of anxiety, asthma, avascular necrosis of left hip, depression, DVT, PTSD, tonsillar cancer with prior chemoradiation, FAP status post total colectomy 2016, right internal jugular thrombus admitted to [facility] on 3/3 with nausea, vomiting, abdominal pain, small bowel obstruction, status post small bowel resection on 02/01/2024; patient also status post Port-A-Cath and gastrostomy tube placements on 09/22/23. CT abdomen pelvis on 3/11 showed multiple loculated fluid collections and IR was consulted for aspiration/drain placement. On 02/09/24 IR placed a 10 Fr midline lower abdominal drain. 100 ml of purulent fluid was aspirated.   He was discharged from the hospital 02/14/24. He presents today to the Interventional Radiology outpatient clinic for a drain evaluation with CT imaging and contrast injection under fluoroscopy. He reports feeling well but has a decreased appetite. He flushes the drain once daily with approximately 5 ml NS and has had minimal output for the past several days. He denies any fevers, chills or significant abdominal pain.   Past Medical History:  Diagnosis Date   Anxiety    Asthma    Avascular necrosis of hip, left (HCC) 07/24/2021   Bright red blood per rectum 12/16/2022   Closed fracture of nasal bone with routine healing 12/24/2016   Depression    DVT (deep venous thrombosis) (HCC)    PTSD (post-traumatic stress disorder)     Past Surgical History:  Procedure Laterality Date   APPENDECTOMY     COLON SURGERY  03/30/2015   IR GASTROSTOMY TUBE MOD SED  09/22/2023   IR IMAGING GUIDED PORT INSERTION  09/22/2023   IR REPLC GASTRO/COLONIC TUBE PERCUT W/FLUORO  02/08/2024   JOINT REPLACEMENT  2008   partial  right elbow; right femoral graft   LAPAROTOMY N/A 02/01/2024   Procedure: EXPLORATORY LAPAROTOMY, LYSIS OF ADHESIONS, SMALL BOWEL RESECTION;  Surgeon: Andria Meuse, MD;  Location: [facility] ORS;  Service: General;  Laterality: N/A;  LYSIS OF ADHESIONS, POSSIBLE SMALL BOWEL RESECTION   Total abdominal proctocolectomy, J-pouch, ileal pouch anal anastomosis  2016    Allergies: Patient has no known allergies.  Medications: Prior to Admission medications   Medication Sig Start Date End Date Taking? Authorizing Provider  apixaban (ELIQUIS) 5 MG TABS tablet Take 1 tablet (5 mg total) by mouth 2 (two) times daily. 02/13/24   Lewie Chamber, MD  celecoxib (CELEBREX) 200 MG capsule Place 1 capsule (200 mg total) into feeding tube daily as needed for moderate pain (pain score 4-6). 11/29/23   [name], MD  magic mouthwash w/lidocaine SOLN Take 10 mLs by mouth 4 (four) times daily as needed for mouth pain. Suspension contains equal amounts of Maalox Extra Strength, nystatin, diphenhydramine and lidocaine. 02/21/24   [name], MD  methocarbamol (ROBAXIN) 500 MG tablet Take 2 tablets (1,000 mg total) by mouth every 6 (six) hours as needed for muscle spasms. 02/14/24   Barnetta Chapel, PA-C  Nutritional Supplements (FEEDING SUPPLEMENT, OSMOLITE 1.5 CAL,) LIQD 7 cartons Osmolite 1.5 split over four feeding daily. Flush tube with 60 ml water before and after each bolus. Provide additional 3 1/2 cups (830 ml) water/day to meet hydration needs. Provides 2485 kcal, 104.3 g protein, 1267 ml free water from formula (2577 ml total water with flushes). 1659 ml/day meets 100% RDI 10/18/23   Basilio Cairo,  Maralyn Sago, MD  OLANZapine (ZYPREXA) 5 MG tablet Take 1 tablet (5 mg total) by mouth at bedtime. 02/17/24   Pasam, [name], MD  omeprazole (PRILOSEC) 20 MG capsule Take 20 mg by mouth See admin instructions. 20 mg per tube once a day PRN Patient not taking: Reported on 02/24/2024    [provider]  ondansetron  (ZOFRAN-ODT) 8 MG disintegrating tablet 8mg  ODT q8hours prn nausea Patient taking differently: Take 8 mg by mouth every 8 (eight) hours as needed for nausea or vomiting. 8mg  ODT q8hours prn nausea 01/26/24   Palumbo, April, MD  oxyCODONE (ROXICODONE) 5 MG/5ML solution Take 5-10 mLs (5-10 mg total) by mouth every 6 (six) hours as needed for moderate pain (pain score 4-6) or severe pain (pain score 7-10). 02/14/24   Barnetta Chapel, PA-C  PARoxetine (PAXIL CR) 12.5 MG 24 hr tablet Take 1 tablet (12.5 mg total) by mouth daily. 08/30/23   Thresa Ross, MD     Family History  Problem Relation Age of Onset   Arthritis Mother    Anemia Mother    Diabetes Father    Hypertension Brother    Asthma Daughter    Heart disease Paternal Grandmother    Cancer Paternal Grandfather     Social History   Socioeconomic History   Marital status: Married    Spouse name: Teacher, English as a foreign language   Number of children: 3   Years of education: 12+   Highest education level: Some college, no degree  Occupational History   Not on file  Tobacco Use   Smoking status: Never   Smokeless tobacco: Never  Vaping Use   Vaping status: Never Used  Substance and Sexual Activity   Alcohol use: Yes    Comment: Occasional   Drug use: Never   Sexual activity: Yes    Partners: Female  Other Topics Concern   Not on file  Social History Narrative   ** Merged History Encounter **       Social Drivers of Health   Financial Resource Strain: Not on file  Food Insecurity: No Food Insecurity (02/01/2024)   Hunger Vital Sign    Worried About Running Out of Food in the Last Year: Never true    Ran Out of Food in the Last Year: Never true  Transportation Needs: No Transportation Needs (02/01/2024)   PRAPARE - Administrator, Civil Service (Medical): No    Lack of Transportation (Non-Medical): No  Physical Activity: Not on file  Stress: Not on file  Social Connections: Not on file     Vital Signs: There were no  vitals taken for this visit.  Physical Exam Constitutional:      General: He is not in acute distress.    Appearance: He is not ill-appearing.  Pulmonary:     Effort: Pulmonary effort is normal.  Abdominal:     Comments: Gastrostomy tube, midline open abdominal incision (covered with gauze) and lower abdominal drain to suction. Drain site shows intact stat-lock. Scant amount of yellow/tan material in bulb.   Skin:    General: Skin is warm and dry.  Neurological:     Mental Status: He is alert and oriented to person, place, and time.     Imaging: No results found.  Labs:  CBC: Recent Labs    02/12/24 0307 02/13/24 0248 02/14/24 0350 02/24/24 1534  WBC 9.5 8.9 10.1 3.9*  HGB 10.3* 10.8* 10.5* 11.3*  HCT 32.6* 34.1* 32.8* 34.7*  PLT 392 489* 466* 346  COAGS: Recent Labs    09/22/23 0826 01/31/24 0455 02/07/24 1929 02/11/24 0655 02/11/24 1423 02/12/24 0307 02/13/24 0248  INR 0.9 1.1  --   --   --   --   --   APTT  --  25   < > 44* 57* 55* 65*   < > = values in this interval not displayed.    BMP: Recent Labs    02/09/24 0607 02/10/24 0527 02/13/24 0248 02/24/24 1534  NA 136 130* 134* 138  K 4.4 4.4 4.4 4.3  CL 104 98 101 102  CO2 21* 23 23 31   GLUCOSE 129* 123* 103* 97  BUN 16 15 16 8   CALCIUM 8.4* 8.3* 8.8* 9.9  CREATININE 0.81 0.75 0.79 0.97  GFRNONAA >60 >60 >60 >60    LIVER FUNCTION TESTS: Recent Labs    02/05/24 0112 02/06/24 0457 02/09/24 0607 02/24/24 1534  BILITOT 0.8 0.4 0.7 0.4  AST 9* 10* 23 12*  ALT 8 8 20 18   ALKPHOS 52 59 109 143*  PROT 5.5* 5.5* 5.8* 7.6  ALBUMIN 2.3* 2.2* 2.2* 4.0    Assessment:  Small bowel resection complicated by post-op intra-abdominal fluid collection. Drain placement in IR 02/09/24  CT imaging showed resolution of the fluid collection. 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 The drain was switched from bulb suction to gravity bag and the patient was  instructed to discontinue flushing the drain. He will follow up with his Surgeon 03/05/24 and IR will plan to see him for a repeat drain evaluation in approximately 2 weeks.     Signed: Mickie Kay, NP 02/27/2024, 12:34 PM   Please refer to Dr. Kenna Gilbert attestation of this note for management and plan.

## 2024-02-27 ENCOUNTER — Ambulatory Visit
Admission: RE | Admit: 2024-02-27 | Discharge: 2024-02-27 | Disposition: A | Discharge status: Home / Self Care | Source: Ambulatory Visit | Attending: Radiology

## 2024-02-27 ENCOUNTER — Ambulatory Visit
Admission: RE | Admit: 2024-02-27 | Discharge: 2024-02-27 | Disposition: A | Discharge status: Home / Self Care | Source: Ambulatory Visit | Attending: Surgery

## 2024-02-27 ENCOUNTER — Ambulatory Visit
Admission: RE | Admit: 2024-02-27 | Discharge: 2024-02-27 | Disposition: A | Discharge status: Home / Self Care | Source: Ambulatory Visit | Attending: Surgery | Admitting: Surgery

## 2024-02-27 DIAGNOSIS — K56609 Unspecified intestinal obstruction, unspecified as to partial versus complete obstruction: Secondary | ICD-10-CM

## 2024-02-27 LAB — TSH: TSH: 1.278 u[IU]/mL (ref 0.350–4.500)

## 2024-02-27 MED ORDER — IOPAMIDOL (ISOVUE-300) INJECTION 61%
100.0000 mL | Freq: Once | INTRAVENOUS | Status: AC | PRN
  Administered 2024-02-27: 100 mL via INTRAVENOUS

## 2024-02-28 ENCOUNTER — Other Ambulatory Visit: Payer: Self-pay | Admitting: Surgery

## 2024-02-28 ENCOUNTER — Encounter: Payer: Self-pay | Admitting: Oncology

## 2024-02-28 DIAGNOSIS — K56609 Unspecified intestinal obstruction, unspecified as to partial versus complete obstruction: Secondary | ICD-10-CM

## 2024-03-05 ENCOUNTER — Ambulatory Visit: Payer: BC Managed Care – PPO | Attending: Radiation Oncology

## 2024-03-05 DIAGNOSIS — R1310 Dysphagia, unspecified: Secondary | ICD-10-CM | POA: Insufficient documentation

## 2024-03-05 NOTE — Therapy (Addendum)
 OUTPATIENT SPEECH LANGUAGE PATHOLOGY ONCOLOGY TREATMENT/RECERTIFICATION  Patient Name: Harry Williams MRN: [id number] DOB:03-10-1981, 43 y.o., male Today's Date: 05/14/2024  PCP: Rebekah Canada, MD REFERRING PROVIDER: Colie Dawes, MD  END OF SESSION:  Visit Number 4      Number of Visits 7     Date for SLP Re-Evaluation 06/03/24     SLP Start Time 1100     SLP Stop Time  1127     SLP Time Calculation (min) 27 min     Activity Tolerance Patient tolerated treatment well     Past Medical History:  Diagnosis Date   Anxiety    Asthma    Avascular necrosis of hip, left (HCC) 07/24/2021   Bright red blood per rectum 12/16/2022   Closed fracture of nasal bone with routine healing 12/24/2016   Depression    DVT (deep venous thrombosis) (HCC)    PTSD (post-traumatic stress disorder)    Past Surgical History:  Procedure Laterality Date   APPENDECTOMY     COLON SURGERY  03/30/2015   IR GASTROSTOMY TUBE MOD SED  09/22/2023   IR IMAGING GUIDED PORT INSERTION  09/22/2023   IR RADIOLOGIST EVAL & MGMT  03/12/2024   IR REPLC GASTRO/COLONIC TUBE PERCUT W/FLUORO  02/08/2024   JOINT REPLACEMENT  2008   partial right elbow; right femoral graft   LAPAROTOMY N/A 02/01/2024   Procedure: EXPLORATORY LAPAROTOMY, LYSIS OF ADHESIONS, SMALL BOWEL RESECTION;  Surgeon: Melvenia Stabs, MD;  Location: [facility];  Service: General;  Laterality: N/A;  LYSIS OF ADHESIONS, POSSIBLE SMALL BOWEL RESECTION   Total abdominal proctocolectomy, J-pouch, ileal pouch anal anastomosis  2016   Patient Active Problem List   Diagnosis Date Noted   SBO (small bowel obstruction) (HCC) 01/31/2024   AKI (acute kidney injury) (HCC) 01/31/2024   Hyponatremia 01/31/2024   Hyperkalemia 01/31/2024   Hypochloremia 01/31/2024   Internal jugular (IJ) vein thromboembolism, acute, right (HCC) 12/16/2023   Oral mucositis due to radiation therapy 11/02/2023   Leukopenia due to antineoplastic chemotherapy (HCC) 10/26/2023    Cancer associated pain 10/26/2023   Visual disturbances 10/05/2023   Hiccups 10/05/2023   GERD without esophagitis 10/05/2023   Port-A-Cath in place 10/04/2023   Bleeding hemorrhoid 09/29/2023   Familial adenomatous polyposis 09/15/2023   Malignant neoplasm of tonsillar fossa (HCC) 09/14/2023   Submandibular swelling 06/16/2023   Rotator cuff impingement syndrome, left 07/02/2022   Insomnia 04/01/2022   Vasovagal syncope 02/11/2022   Cervical spondylosis 11/13/2021   Healthcare maintenance 09/04/2021   Anxiety and depression 07/24/2021   Other spondylosis with radiculopathy, lumbar region 07/24/2021   Adenomatous polyp 04/04/2015   Speech Therapy Progress Note  Number of sessions: 4  Subjective Statement: Pt has been seen for 4 sessions focusing on swallowing during and after radiation tx.   Objective: Pt has shown good success with HEP and with POs  Goal Update: See below  Plan: See belos  Reason Skilled Services are Required: One more session needed to verify progress continues over time, after GI surgery last month.    ONSET DATE: July 2024 (script  10/03/23)  REFERRING DIAG:  C09.0 (ICD-10-CM) - Malignant neoplasm of tonsillar fossa (HCC)    THERAPY DIAG:  Dysphagia, unspecified type - Plan: SLP plan of care cert/re-cert  Rationale for Evaluation and Treatment: Rehabilitation  SUBJECTIVE:   SUBJECTIVE STATEMENT: I had a major surgery. I lost a lot of weight again. I'm just getting back to eating again.   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 He presented to his PCP July 2024 and was incidentally noted to have left submandibular swelling on routine examination. 07/07/23 neck US  which showed an ovoid mass measuring 2.8 x 1.9 cm, most consistent with an abnormally enlarged lymph node, and correlating with the palpable area in the left  submandibular region. 07/20/23 CT neck completed which demonstrated: a  2.5 x 3 cm mass in the left tonsillar region with extension towards the tongue base, suspicious for carcinoma, and evidence of level 2 lymphadenopathy on the left, consisting of a dominant node measuring 3 x 2 x 1.7 cm. A benign appearing osteoma was also demonstrated, projecting inferior from the angle of the left mandible. He was referred to Dr. Elida Grounds at [facility] and underwent biopsies of the left tonsil 08/26/23. Pathology findings revealed findings consistent with HPV associated invasive SCC, p 16 +. 09/07/23 PET at [facility] showed intense radiotracer uptake within the left palatine tonsillar mass; adjacent radiotracer uptake within the region of the right palatine tonsil; and hypermetabolic nodes in the left neck extending to the supraclavicular region concerning for nodal metastases. Focal uptake at the anorectal junction was also demonstrated, possibly reflecting muscular contraction, however local malignancy can not be excluded. PET otherwise showed no evidence of more distant metastatic disease. Consult with Dr. Lurena Sally 10/16 & Consult with Dr. Randye Buttner 10/17. Radiation/chemotherapy planned.Treatment plan:  He will receive 35 fractions of radiation to his left tonsil and bilateral neck which started on 10/29 and will complete 12/18.  PAIN:  Are you having pain? No  FALLS: Has patient fallen in last 6 months?  No  PATIENT GOALS: Maintain WNL swallowing  OBJECTIVE:  Note: Objective measures were completed at Evaluation unless otherwise noted.   TODAY'S TREATMENT:                                                                                                                                         DATE:   03/05/24: Pt with major corrective GI sx early-mid March. Pt eating dys I-III items (including cornbread) in last 3 days. Not eating crunchy and hard items to this point. Ate peanut butter crackers and drank water today without overt  s/sx oral or pharyngeal deficits.  Completed HEP with independence - req'd water more today than last session to complete x5 reps each.   01/04/24: Chicken, potatoes, corn, grits, cereal, fish in the last week. Pt was making headway towards supplementing with tube instead of with food before he had some fish that was very very spicy, and now has gone more supplementing with food. SLP encouraged him to wean more from tube and get back where he was prior to having the fish. Aarish shares that Maiden (dietician) had also encouraged him to do that. Today SLP had pt eat peanut  butter crackers and drank some water, without any overt s/sx oral or pharyngeal deficits. With HEP, pt's procedure was WNL. He has been performing HEP as directed since last session. SLP provided pt with overt s/sx aspiration PNA and pt repeated back to SLP with independence. SLP and pt agree pt can be seen again in two months.  12/02/23: Pt req'd min A usually with HEP today, independent by session end. He req'd max A for rationale for HEP.  He has attempted grapes and water at home, with good pharyngeal clearance. No coughing/wet voice ID 'd during those POs. Today SLP provided pt with applesauce and water without any overt s/sx oral or pharyngeal deficits. SLP told pt he could safely eat these type of POs and encouraged him to cut his last PEG feeding in half to wake up slightly hungry and have grits, wet scrambled eggs/pureed eggs, slurried biscuits and gravy - all with liquid wash, or a carnation instant breakfast/Ensure/Boost PO. SLP encouraged him to discuss matching calorie intake between tube feed and PO with Ottie Blonder at next dietician visit on 12/09/23. SLP told pt if he begins to lose weight to keep all tube feeds unchanged from current regimen and just have POs in addition to tube feeds.\ Pt demo'd understanding of details and rationale of a food journal to SLP.  10/25/23 (eval): Research states the risk for dysphagia increases due  to radiation and/or chemotherapy treatment due to a variety of factors, so SLP educated the pt about the possibility of reduced/limited ability for PO intake during rad tx. SLP also educated pt regarding possible changes to swallowing musculature after rad tx, and why adherence to dysphagia HEP provided today and PO consumption was necessary to inhibit muscle fibrosis following rad tx and to mitigate muscle disuse atrophy. SLP informed pt why this would be detrimental to their swallowing status and to their pulmonary health. Pt demonstrated understanding of these things to SLP. SLP encouraged pt to safely eat and drink as deep into their radiation/chemotherapy as possible to provide the best possible long-term swallowing outcome for pt.  SLP then developed an individualized HEP for pt involving oral and pharyngeal strengthening and ROM and pt was instructed how to perform these exercises, including SLP demonstration. After SLP demonstration, pt return demonstrated each exercise. SLP ensured pt performance was correct prior to educating pt on next exercise. Pt required min cues faded to modified independent to perform HEP. Pt was instructed to complete this program 5-7 days/week, at least 20 reps a day until 6 months after his or her last day of rad tx, and then x2 a week after that, indefinitely. Among other modifications for days when pt cannot functionally swallow, SLP also suggested pt to perform only non-swallowing tasks on the handout/HEP, and if necessary to cycle through the swallowing portion so the full program of exercises can be completed instead of fatiguing on one of the swallowing exercises and being unable to perform the other swallowing exercises. SLP instructed that swallowing exercises should then be added back into the regimen as pt is able to do so. Secondly, pt was told that former patients have told SLP that during their course of radiation therapy, taking prescribed pain medication just prior  to performing HEP (and eating/drinking) has proven helpful in completing HEP (and eating and drinking) more regularly when going through their course of radiation treatment.    PATIENT EDUCATION: Education details: when to reduce frequency of HEP Person educated: Patient Education method: Explanation Education comprehension: verbalized  understanding   ASSESSMENT:  CLINICAL IMPRESSION: Patient is a 43 y.o. M who was seen today for treatment of swallowing following completion of radiation/chemoradiation therapy. Today pt ate items from regular diet and drank thin liquids without overt s/s oral or pharyngeal difficulty. At this time pt swallowing is deemed WNL/WFL with these POs. There are no overt s/s aspiration PNA observed by SLP nor any reported by pt at this time. Data indicate that pt's swallow ability will likely decrease over the course of radiation/chemoradiation therapy and could very well decline over time following the conclusion of that therapy due to muscle disuse atrophy and/or muscle fibrosis. Pt will cont to need to be seen by SLP in order to assess safety of PO intake, assess the need for recommending any objective swallow assessment, and ensuring pt is correctly completing the individualized HEP.  OBJECTIVE IMPAIRMENTS: include dysphagia. These impairments are limiting patient from safety when swallowing. Factors affecting potential to achieve goals and functional outcome are none noted today. Patient will benefit from skilled SLP services to address above impairments and improve overall function.  REHAB POTENTIAL: Good   GOALS: Goals reviewed with patient? No SHORT TERM GOALS: Target: 3rd total session   1. Pt will complete HEP with modified independence in 2 sessions Baseline: 01/04/24 Goal status: Paritally met   2.  pt will tell SLP why pt is completing HEP with modified independence Baseline:  Goal status: Met   3.  pt will describe 3 overt s/s aspiration PNA with  modified independence Baseline:  Goal status: Met   4.  pt will tell SLP how a food journal could hasten return to a more normalized diet Baseline:  Goal status: Met     LONG TERM GOALS: Target: 7th total session   1.  pt will complete HEP with independence over two visits Baseline: 03/05/24 Goal status: INITIAL   2.  pt will describe how to modify HEP over time, and the timeline associated with reduction in HEP frequency with modified independence over two sessions Baseline:  Goal status: INITIAL     PLAN:   SLP FREQUENCY:  once approx every 4 weeks   SLP DURATION:  7 sessions   PLANNED INTERVENTIONS: Aspiration precaution training, Pharyngeal strengthening exercises, Diet toleration management , Trials of upgraded texture/liquids, SLP instruction and feedback, Compensatory strategies, and Patient/family education    [facility], CCC-SLP 05/14/2024, 10:20 AM

## 2024-03-07 ENCOUNTER — Inpatient Hospital Stay: Attending: Oncology | Admitting: Dietician

## 2024-03-07 NOTE — Progress Notes (Signed)
 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 Chemotherapy discontinued secondary to side effects. S/p PEG 10/24.   3/3-3/15 admission with [facility]  Met with pt in office. He continues to recover from prolonged hospitalization and surgery. Patient reports 3/10 abdominal pain at visit today. He is working to increase oral intake. Patient reports aversion to meat since completing therapy. He also notes that sugar irritates his throat, recalling eating candy bar that made throat feel raw. Yesterday patient ate 2 breakfast burritos, bowl of frosted flakes with 2% milk, chick fila sandwich, few bites of salmon, single piece of corn, and popcorn. Patient continues using tube to supplement oral intake. He reports usually giving 4 cartons Osmolite. Patient denies nausea, vomiting, diarrhea, constipation.       Medications: reviewed   Labs: 3/28 labs reviewed   Anthropometrics: Wt 148.2 lb today in RD office increased from 140 lb 6.4 oz on 3/28  1/16 - 160 lb 6.4 oz   Weights decreased ~8% in 10 weeks - severe for time frame  NUTRITION DIAGNOSIS: Inadequate oral intake improving - supplementing with TF   INTERVENTION:  Continue strategies for increasing oral intake Recommend pt to try eating orally at every meal - he will continue supplementing with 3 Osmolite via tube Will continue working with patient to wean from tube    MONITORING, EVALUATION, GOAL: wt trends, intake   NEXT VISIT: Friday May 9 before port flush (pt aware)

## 2024-03-12 ENCOUNTER — Ambulatory Visit
Admission: RE | Admit: 2024-03-12 | Discharge: 2024-03-12 | Disposition: A | Discharge status: Home / Self Care | Source: Ambulatory Visit | Attending: Surgery | Admitting: Surgery

## 2024-03-12 DIAGNOSIS — K56609 Unspecified intestinal obstruction, unspecified as to partial versus complete obstruction: Secondary | ICD-10-CM

## 2024-03-12 HISTORY — PX: IR RADIOLOGIST EVAL & MGMT: IMG5224

## 2024-03-12 MED ORDER — IOPAMIDOL (ISOVUE-300) INJECTION 61%
100.0000 mL | Freq: Once | INTRAVENOUS | Status: AC | PRN
  Administered 2024-03-12: 100 mL via INTRAVENOUS

## 2024-03-12 NOTE — Progress Notes (Signed)
 Chief Complaint: Patient was seen in consultation today for follow up of 02/09/24 intra-abdominal fluid collection drain placement performed at the request of White,Christopher M  Referring Physician(s): White,Christopher M  Supervising Physician: Roanna Banning  History of Present Illness: Harry Williams is a 43 y.o. male with past medical history of anxiety, asthma, avascular necrosis of left hip, depression, DVT, PTSD, tonsillar cancer with prior chemoradiation, FAP status post total colectomy 2016, right internal jugular thrombus. He is known to IR from previous G-tube and port placement. He developed multiple post-surgical fluid collections demonstrated on 3/11 CT after undergoing small bowel resection 02/01/24 for SBO. On 02/09/24, IR placed a 10 Fr midline lower abdominal drain. 100 ml of purulent fluid was aspirated.    He was discharged from the hospital 02/14/24 and returned to IR drain clinic on 02/27/24. 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  His bag was transitioned from bulb to gravity collection and he now returns for 2 week follow up. He denies abd pain but does note foul odor to drain site. Denies new N/V, fever, chills, blood in stool, SOB, CP. Not currently on an abx. He has not been flushing the drain and has not had any measurable output from drain in last 48 hrs. He is pleasant, interested in his care, and ambulatory. Scheduled with Dr. Cliffton Asters on 5/13.   Past Medical History:  Diagnosis Date   Anxiety    Asthma    Avascular necrosis of hip, left (HCC) 07/24/2021   Bright red blood per rectum 12/16/2022   Closed fracture of nasal bone with routine healing 12/24/2016   Depression    DVT (deep venous thrombosis) (HCC)    PTSD (post-traumatic stress disorder)     Past Surgical History:  Procedure Laterality Date   APPENDECTOMY     COLON SURGERY  03/30/2015   IR GASTROSTOMY TUBE MOD  SED  09/22/2023   IR IMAGING GUIDED PORT INSERTION  09/22/2023   IR REPLC GASTRO/COLONIC TUBE PERCUT W/FLUORO  02/08/2024   JOINT REPLACEMENT  2008   partial right elbow; right femoral graft   LAPAROTOMY N/A 02/01/2024   Procedure: EXPLORATORY LAPAROTOMY, LYSIS OF ADHESIONS, SMALL BOWEL RESECTION;  Surgeon: Andria Meuse, MD;  Location: [facility];  Service: General;  Laterality: N/A;  LYSIS OF ADHESIONS, POSSIBLE SMALL BOWEL RESECTION   Total abdominal proctocolectomy, J-pouch, ileal pouch anal anastomosis  2016    Allergies: Patient has no known allergies.  Medications: Prior to Admission medications   Medication Sig Start Date End Date Taking? Authorizing Provider  apixaban (ELIQUIS) 5 MG TABS tablet Take 1 tablet (5 mg total) by mouth 2 (two) times daily. 02/13/24   Lewie Chamber, MD  celecoxib (CELEBREX) 200 MG capsule Place 1 capsule (200 mg total) into feeding tube daily as needed for moderate pain (pain score 4-6). 11/29/23   [name], MD  magic mouthwash w/lidocaine SOLN Take 10 mLs by mouth 4 (four) times daily as needed for mouth pain. Suspension contains equal amounts of Maalox Extra Strength, nystatin, diphenhydramine and lidocaine. 02/21/24   [name], MD  methocarbamol (ROBAXIN) 500 MG tablet Take 2 tablets (1,000 mg total) by mouth every 6 (six) hours as needed for muscle spasms. 02/14/24   Barnetta Chapel, PA-C  Nutritional Supplements (FEEDING SUPPLEMENT, OSMOLITE 1.5 CAL,) LIQD 7 cartons Osmolite 1.5 split over four feeding daily. Flush tube with 60 ml water before and after each  bolus. Provide additional 3 1/2 cups (830 ml) water/day to meet hydration needs. Provides 2485 kcal, 104.3 g protein, 1267 ml free water from formula (2577 ml total water with flushes). 1659 ml/day meets 100% RDI 10/18/23   [name], MD  OLANZapine (ZYPREXA) 5 MG tablet Take 1 tablet (5 mg total) by mouth at bedtime. 02/17/24   Pasam, [name], MD  omeprazole (PRILOSEC) 20 MG capsule  Take 20 mg by mouth See admin instructions. 20 mg per tube once a day PRN Patient not taking: Reported on 02/24/2024    [provider]  ondansetron (ZOFRAN-ODT) 8 MG disintegrating tablet 8mg  ODT q8hours prn nausea Patient taking differently: Take 8 mg by mouth every 8 (eight) hours as needed for nausea or vomiting. 8mg  ODT q8hours prn nausea 01/26/24   Palumbo, April, MD  oxyCODONE (ROXICODONE) 5 MG/5ML solution Take 5-10 mLs (5-10 mg total) by mouth every 6 (six) hours as needed for moderate pain (pain score 4-6) or severe pain (pain score 7-10). 02/14/24   Barnetta Chapel, PA-C  PARoxetine (PAXIL CR) 12.5 MG 24 hr tablet Take 1 tablet (12.5 mg total) by mouth daily. 08/30/23   Thresa Ross, MD     Family History  Problem Relation Age of Onset   Arthritis Mother    Anemia Mother    Diabetes Father    Hypertension Brother    Asthma Daughter    Heart disease Paternal Grandmother    Cancer Paternal Grandfather     Social History   Socioeconomic History   Marital status: Married    Spouse name: Teacher, English as a foreign language   Number of children: 3   Years of education: 12+   Highest education level: Some college, no degree  Occupational History   Not on file  Tobacco Use   Smoking status: Never   Smokeless tobacco: Never  Vaping Use   Vaping status: Never Used  Substance and Sexual Activity   Alcohol use: Yes    Comment: Occasional   Drug use: Never   Sexual activity: Yes    Partners: Female  Other Topics Concern   Not on file  Social History Narrative   ** Merged History Encounter **       Social Drivers of Health   Financial Resource Strain: Not on file  Food Insecurity: No Food Insecurity (02/01/2024)   Hunger Vital Sign    Worried About Running Out of Food in the Last Year: Never true    Ran Out of Food in the Last Year: Never true  Transportation Needs: No Transportation Needs (02/01/2024)   PRAPARE - Administrator, Civil Service (Medical): No    Lack of  Transportation (Non-Medical): No  Physical Activity: Not on file  Stress: Not on file  Social Connections: Not on file     Review of Systems: A 12 point ROS discussed and pertinent positives are indicated in the HPI above.  All other systems are negative.    Vital Signs: BP (!) 101/58 (BP Location: Right Arm, Patient Position: Sitting, Cuff Size: Normal)   Pulse 79   Temp 98.3 F (36.8 C)   Resp 16   SpO2 100%   Physical Exam Constitutional:      Appearance: Normal appearance.  Abdominal:     General: There is no distension.     Palpations: Abdomen is soft.     Tenderness: There is no abdominal tenderness.     Comments: G tube present and dressed. Abd drain present and intact with  yellow, mucous-like drainage present on dressing which appears to be coming from g-tube. Minimal erythema at insertion site. Suture and stat lock in place.  Neurological:     Mental Status: He is alert.     Mallampati Score:     Imaging: CT ABDOMEN PELVIS W CONTRAST Result Date: 03/05/2024 CLINICAL DATA:  Status post partial colon and small bowel resection, drain for small bowel abscess EXAM: CT ABDOMEN AND PELVIS WITH CONTRAST TECHNIQUE: Multidetector CT imaging of the abdomen and pelvis was performed using the standard protocol following bolus administration of intravenous contrast. RADIATION DOSE REDUCTION: This exam was performed according to the departmental dose-optimization program which includes automated exposure control, adjustment of the mA and/or kV according to patient size and/or use of iterative reconstruction technique. CONTRAST:  100mL ISOVUE-300 IOPAMIDOL (ISOVUE-300) INJECTION 61% COMPARISON:  None Available. FINDINGS: Lower chest: Lung bases are clear. Hepatobiliary: Liver is within normal limits. Gallbladder is unremarkable. No intrahepatic or extrahepatic ductal dilatation. Pancreas: Within normal limits. Spleen: Within normal limits Adrenals/Urinary Tract: Adrenal glands are  within normal limits. Kidneys are within normal limits.  No hydronephrosis. Thick-walled bladder with left lateral bladder wall thickening (series 2/image 65), favored to be secondary/reactive. Stomach/Bowel: Stomach is notable for a gastrostomy in satisfactory position. Status post subtotal colectomy. Mild wall thickening of the residual rectum (series 2/image 61). Additionally, there is a moderate wall thickening of pelvic loops of distal small bowel (series 2/image 58). This appearance may be reactive given recent pelvic abscess, but enterocolitis is possible. No pneumatosis or free air. Vascular/Lymphatic: No evidence of abdominal aortic aneurysm. No suspicious abdominopelvic lymphadenopathy. Reproductive: Prostate is unremarkable. Other: No abdominopelvic ascites. Pigtail drain in the anterior lower abdomen (series 2/image 58), without discrete/drainable fluid collection. Very mild interloop fluid is possible. Musculoskeletal: Grade 1 spondylolisthesis at L5-S1 with degenerative changes. IMPRESSION: Pigtail drain in the anterior lower abdomen, without discrete/drainable fluid collection. Very mild interloop fluid is possible. Status post subtotal colectomy. Mild to moderate wall thickening of the residual rectum and distal small bowel, possibly reactive given recent abscess, although enterocolitis is possible. No pneumatosis or free air. Thick-walled bladder with left lateral bladder wall thickening, favored to be secondary/reactive. Electronically Signed   By: Zadie Herter M.D.   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 EXAM: NUCLEAR MEDICINE PET SKULL BASE TO THIGH TECHNIQUE: 7.4 mCi F-18 FDG was injected intravenously. Full-ring PET imaging was performed from the skull base to thigh after the radiotracer. CT data was obtained and used for attenuation correction and anatomic  localization. Fasting blood glucose: 86 mg/dl COMPARISON:  CT 65/78/4696 outside PET-CT scan 09/09/2023 chest FINDINGS: NECK: Resolution of the asymmetric hypermetabolic tissue in the LEFT tonsil seen on comparison PET-CT scan. No residual asymmetric are hypermetabolic tissue in the oropharynx or hypopharynx. No hypermetabolic cervical lymph nodes. Linear metabolic activity in the RIGHT level 2 nodal station (image 38) is favored related to IJ thrombosis rather than lymph node. Incidental CT findings: None. CHEST: There is mild metabolic activity within the proximal esophagus through the thoracic inlet (image 151). No hypermetabolic mediastinal lymph nodes. Symmetric mild hilar metabolic activity. No suspicious pulmonary nodules. Incidental CT findings: Port in the anterior chest wall with tip in distal SVC. ABDOMEN/PELVIS: No abnormal metabolic activity within the liver. Mild activity associated with the midline ventral open wound (image 135 ). This is favored inflammatory. There is evidence of  bowel resections with anastomoses in the mid abdomen and pelvis. There is a focus intense metabolic activity associated with the bowel in the lateral RIGHT mid abdomen just below the liver. This activity is round/rim like and intense with SUV max equal 12.5 on image 119. No clear lesion on the noncontrast CT. There is significant streak artifact from adjacent internal fixation of the LEFT arm. No abnormal activity at this site on comparison PET-CT scan. On comparison CT scan from 02/07/2024, there potential fluid collections at this site. Concern for abscess formation in the RIGHT abdomen. Incidental CT findings: Percutaneous gastrostomy tube in expected location the stomach. Drainage catheter in the upper pelvis with no identifiable fluid collection. SKELETON: No focal hypermetabolic activity to suggest skeletal metastasis. IMPRESSION: 1. No evidence of residual carcinoma in the oropharynx or hypopharynx. 2. No  hypermetabolic cervical lymph nodes. 3. 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 4. New rounded intense radiotracer activity in the lateral RIGHT abdomen adjacent to the bowel and liver. Favor abscess formation related to recent bowel surgery. Postsurgical fluid collections noted on CT 02/07/2024. Consider contrast CT of the abdomen pelvis for further evaluation. 5. Drainage catheter in the upper pelvis at site of prior fluid collection. 6. Midline metabolic activity associated open wound consistent with inflammation or infection. These results will be called to the ordering clinician or representative by the Radiologist Assistant, and communication documented in the PACS or [employer]. Electronically Signed   By: Genevive Bi M.D.   On: 02/20/2024 18:26    Labs:  CBC: Recent Labs    02/12/24 0307 02/13/24 0248 02/14/24 0350 02/24/24 1534  WBC 9.5 8.9 10.1 3.9*  HGB 10.3* 10.8* 10.5* 11.3*  HCT 32.6* 34.1* 32.8* 34.7*  PLT 392 489* 466* 346    COAGS: Recent Labs    09/22/23 0826 01/31/24 0455 02/07/24 1929 02/11/24 0655 02/11/24 1423 02/12/24 0307 02/13/24 0248  INR 0.9 1.1  --   --   --   --   --   APTT  --  25   < > 44* 57* 55* 65*   < > = values in this interval not displayed.    BMP: Recent Labs    02/09/24 0607 02/10/24 0527 02/13/24 0248 02/24/24 1534  NA 136 130* 134* 138  K 4.4 4.4 4.4 4.3  CL 104 98 101 102  CO2 21* 23 23 31   GLUCOSE 129* 123* 103* 97  BUN 16 15 16 8   CALCIUM 8.4* 8.3* 8.8* 9.9  CREATININE 0.81 0.75 0.79 0.97  GFRNONAA >60 >60 >60 >60    LIVER FUNCTION TESTS: Recent Labs    02/05/24 0112 02/06/24 0457 02/09/24 0607 02/24/24 1534  BILITOT 0.8 0.4 0.7 0.4  AST 9* 10* 23 12*  ALT 8 8 20 18   ALKPHOS 52 59 109 143*  PROT 5.5* 5.5* 5.8* 7.6  ALBUMIN 2.3* 2.2* 2.2* 4.0     Assessment:  Patient nontoxic appearance supported by vital signs and  ROS. Erythema and odor appear to be related to unavoidable drainage from G-tube site superior to drain or from old drainage that soaked into fabric/velcro of stat lock. Patient has largely done well with keeping insertion site as clean as possible.   CT performed and drain injected. Reviewed by Dr. Milford Cage who notes that evidence supports fistula being closed and fluid collection resolved. Discussed possibility of recurrence with patient; Mr. Apuzzo understands and agrees with plan to remove  drain today based on imaging.   Drain removed per Dr. Darylene Epley. Drain removal procedure: Dressing and stat lock removed. External retention suture cut and removed in its entirety. Drain tubing cut and also removed its entirety including internal retention string. Well tolerated by patient. Clean dressing applied.   Patient will follow up with Dr. Camilo Cella as planned 04/10/24. Discussed red flag symptoms which would require sooner medical attention.   Electronically Signed: Terressa Fess NP 03/12/2024, 12:43 PM   Please refer to Dr. Phebe Brasil attestation of this note for management and plan.

## 2024-03-20 ENCOUNTER — Telehealth: Payer: Self-pay

## 2024-03-20 NOTE — Telephone Encounter (Signed)
 [facility] CSW Progress Note  Clinical Child psychotherapist contacted patient by phone to discuss request for financial assistance. CSW submitted referral to cancer services on this date. No follow up scheduled at this time. Patient and spouse have direct contact.  Maudie Sorrow, LCSW Clinical Social Worker [facility]

## 2024-03-20 NOTE — Telephone Encounter (Signed)
 [facility] CSW Progress Note  Clinical Child psychotherapist  received email from patient's spouse  regarding financial assistance. CSW messaged organization Cancer Services to determine if patient is eligible. CSW attempted to reach patient's spouse to provide update.   Maudie Sorrow, LCSW Clinical Social Worker [facility]

## 2024-03-27 ENCOUNTER — Other Ambulatory Visit: Payer: Self-pay | Admitting: Family Medicine

## 2024-03-27 MED ORDER — CELECOXIB 200 MG PO CAPS: 200.0000 mg | ORAL_CAPSULE | Freq: Every day | ORAL | 0 refills | Status: AC | PRN

## 2024-04-06 ENCOUNTER — Encounter: Payer: Self-pay | Admitting: Dietician

## 2024-04-06 ENCOUNTER — Inpatient Hospital Stay: Attending: Oncology

## 2024-04-06 DIAGNOSIS — Z452 Encounter for adjustment and management of vascular access device: Secondary | ICD-10-CM | POA: Insufficient documentation

## 2024-04-06 DIAGNOSIS — C090 Malignant neoplasm of tonsillar fossa: Secondary | ICD-10-CM | POA: Insufficient documentation

## 2024-04-06 DIAGNOSIS — Z95828 Presence of other vascular implants and grafts: Secondary | ICD-10-CM

## 2024-04-06 MED ORDER — HEPARIN SOD (PORK) LOCK FLUSH 100 UNIT/ML IV SOLN
500.0000 [IU] | Freq: Once | INTRAVENOUS | Status: AC
  Administered 2024-04-06: 500 [IU]

## 2024-04-06 MED ORDER — SODIUM CHLORIDE 0.9% FLUSH
10.0000 mL | Freq: Once | INTRAVENOUS | Status: AC
Start: 2024-04-06 — End: 2024-04-06
  Administered 2024-04-06: 10 mL

## 2024-04-06 NOTE — Patient Instructions (Signed)

## 2024-04-06 NOTE — Progress Notes (Signed)
 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 Chemotherapy discontinued secondary to side effects. S/p PEG 10/24.    3/3-3/15 admission with SBO  Met with patient in office. He reports doing well. Patient has been trying to eat more orally. Reports 2-3 meals. Recalls bacon slices and 4 pieces of french toast for breakfast. Had steak/chicken rice/cheese from a [employer] for dinner. Pt reports textures of meat are challenging, but tolerates okay with water to help wash it down. Pt is supplementing with 2 Osmolite. He denies nausea, vomiting, diarrhea, constipation.    Medications: reviewed  Labs: no new labs for review  Anthropometrics: Wt 143.8 lb in RD office today  4/9 - 148.2 lb 3/28 - 140 lb 6.4 oz   NUTRITION DIAGNOSIS: Inadequate oral intake ongoing - supplementing with TF   INTERVENTION:  Continue strategies for increasing oral intake Encourage high calorie high protein foods to promote wt gain Continue supplementing with TF - 2-3 cartons Osmolite 1.5 Will continue working with pt to wean from tube  Support and encouragement   MONITORING, EVALUATION, GOAL: wt trends, intake, TF   NEXT VISIT: Friday June 20 before port flush

## 2024-04-11 ENCOUNTER — Inpatient Hospital Stay

## 2024-04-11 ENCOUNTER — Inpatient Hospital Stay: Admitting: Genetic Counselor

## 2024-04-17 ENCOUNTER — Other Ambulatory Visit: Payer: Self-pay

## 2024-04-17 ENCOUNTER — Other Ambulatory Visit (HOSPITAL_COMMUNITY): Payer: Self-pay

## 2024-04-30 ENCOUNTER — Other Ambulatory Visit (HOSPITAL_COMMUNITY): Payer: Self-pay

## 2024-05-01 ENCOUNTER — Other Ambulatory Visit (HOSPITAL_COMMUNITY): Payer: Self-pay

## 2024-05-01 ENCOUNTER — Encounter: Payer: Self-pay | Admitting: Oncology

## 2024-05-14 ENCOUNTER — Ambulatory Visit: Attending: Radiation Oncology

## 2024-05-14 DIAGNOSIS — R1310 Dysphagia, unspecified: Secondary | ICD-10-CM | POA: Insufficient documentation

## 2024-05-14 NOTE — Therapy (Signed)
 OUTPATIENT SPEECH LANGUAGE PATHOLOGY ONCOLOGY TREATMENT/Discharge Summary  Patient Name: Harry Williams MRN: [id number] DOB:07-20-1981, 43 y.o., male Today's Date: 05/14/2024  PCP: Rebekah Canada, MD REFERRING PROVIDER: Colie Dawes, MD  END OF SESSION:  End of Session - 05/14/24 1022     Visit Number 5    Number of Visits 7    Date for SLP Re-Evaluation 06/03/24    SLP Start Time 1020    SLP Stop Time  1053    SLP Time Calculation (min) 33 min    Activity Tolerance Patient tolerated treatment well           Past Medical History:  Diagnosis Date   Anxiety    Asthma    Avascular necrosis of hip, left (HCC) 07/24/2021   Bright red blood per rectum 12/16/2022   Closed fracture of nasal bone with routine healing 12/24/2016   Depression    DVT (deep venous thrombosis) (HCC)    PTSD (post-traumatic stress disorder)    Past Surgical History:  Procedure Laterality Date   APPENDECTOMY     COLON SURGERY  03/30/2015   IR GASTROSTOMY TUBE MOD SED  09/22/2023   IR IMAGING GUIDED PORT INSERTION  09/22/2023   IR RADIOLOGIST EVAL & MGMT  03/12/2024   IR REPLC GASTRO/COLONIC TUBE PERCUT W/FLUORO  02/08/2024   JOINT REPLACEMENT  2008   partial right elbow; right femoral graft   LAPAROTOMY N/A 02/01/2024   Procedure: EXPLORATORY LAPAROTOMY, LYSIS OF ADHESIONS, SMALL BOWEL RESECTION;  Surgeon: Melvenia Stabs, MD;  Location: [facility];  Service: General;  Laterality: N/A;  LYSIS OF ADHESIONS, POSSIBLE SMALL BOWEL RESECTION   Total abdominal proctocolectomy, J-pouch, ileal pouch anal anastomosis  2016   Patient Active Problem List   Diagnosis Date Noted   SBO (small bowel obstruction) (HCC) 01/31/2024   AKI (acute kidney injury) (HCC) 01/31/2024   Hyponatremia 01/31/2024   Hyperkalemia 01/31/2024   Hypochloremia 01/31/2024   Internal jugular (IJ) vein thromboembolism, acute, right (HCC) 12/16/2023   Oral mucositis due to radiation therapy 11/02/2023   Leukopenia due to  antineoplastic chemotherapy (HCC) 10/26/2023   Cancer associated pain 10/26/2023   Visual disturbances 10/05/2023   Hiccups 10/05/2023   GERD without esophagitis 10/05/2023   Port-A-Cath in place 10/04/2023   Bleeding hemorrhoid 09/29/2023   Familial adenomatous polyposis 09/15/2023   Malignant neoplasm of tonsillar fossa (HCC) 09/14/2023   Submandibular swelling 06/16/2023   Rotator cuff impingement syndrome, left 07/02/2022   Insomnia 04/01/2022   Vasovagal syncope 02/11/2022   Cervical spondylosis 11/13/2021   Healthcare maintenance 09/04/2021   Anxiety and depression 07/24/2021   Other spondylosis with radiculopathy, lumbar region 07/24/2021   Adenomatous polyp 04/04/2015   Speech Therapy discharge summary  Number of sessions: 5  Subjective Statement: Pt has been seen for 5 sessions focusing on swallowing during and after radiation tx. See below for goal summary. Pt met all LTGs.  Objective: Pt has shown good success with HEP and with POs  A/P: See clinical impression below. Pt will be d/c'd today and agrees with d/c. Discharge occurring due to pt meeting his goals.    ONSET DATE: July 2024 (script  10/03/23)  REFERRING DIAG:  C09.0 (ICD-10-CM) - Malignant neoplasm of tonsillar fossa (HCC)    THERAPY DIAG:  Dysphagia, unspecified type  Rationale for Evaluation and Treatment: Rehabilitation  SUBJECTIVE:   SUBJECTIVE STATEMENT: Within the last month I've had this where I have some phlegm and I clear it and then It comes back a little  bit later.   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 He presented to his PCP July 2024 and was incidentally noted to have left submandibular swelling on routine examination. 07/07/23 neck US  which showed an ovoid mass measuring 2.8 x 1.9 cm, most consistent with an abnormally enlarged lymph node, and correlating with  the palpable area in the left submandibular region. 07/20/23 CT neck completed which demonstrated: a  2.5 x 3 cm mass in the left tonsillar region with extension towards the tongue base, suspicious for carcinoma, and evidence of level 2 lymphadenopathy on the left, consisting of a dominant node measuring 3 x 2 x 1.7 cm. A benign appearing osteoma was also demonstrated, projecting inferior from the angle of the left mandible. He was referred to Dr. Elida Grounds at [facility] and underwent biopsies of the left tonsil 08/26/23. Pathology findings revealed findings consistent with HPV associated invasive SCC, p 16 +. 09/07/23 PET at [facility] showed intense radiotracer uptake within the left palatine tonsillar mass; adjacent radiotracer uptake within the region of the right palatine tonsil; and hypermetabolic nodes in the left neck extending to the supraclavicular region concerning for nodal metastases. Focal uptake at the anorectal junction was also demonstrated, possibly reflecting muscular contraction, however local malignancy can not be excluded. PET otherwise showed no evidence of more distant metastatic disease. Consult with Dr. Lurena Sally 10/16 & Consult with Dr. Randye Buttner 10/17. Radiation/chemotherapy planned.Treatment plan:  He will receive 35 fractions of radiation to his left tonsil and bilateral neck which started on 10/29 and will complete 12/18.  PAIN:  Are you having pain? No  FALLS: Has patient fallen in last 6 months?  No  PATIENT GOALS: Maintain WNL swallowing  OBJECTIVE:  Note: Objective measures were completed at Evaluation unless otherwise noted.   TODAY'S TREATMENT:                                                                                                                                         DATE:   05/14/24: Given pt's S, SLP educated him today on throat clearing alternatives. Harry Williams stated this phlegm can occur at any time during the day. SLP told him he suspected it was due to cont'd healing  but to ask ENT during his next appointment, which pt thought was next month.  Pt doing HEP approx x2/week; has been doing this for about 4 weeks. SLP educated pt on that he could decr to x2/week (but no less than this) from now on.  HEP today was completed with independence. SLP shared how to increase difficulty with exercises from this point onward. Pt ate peanut butter crackers with water today and no overt s/sx of oral nor pharyngeal difficulty.   03/05/24: Pt with major corrective GI sx early-mid March. Pt eating dys I-III  items (including cornbread) in last 3 days. Not eating crunchy and hard items to this point. Ate peanut butter crackers and drank water today without overt s/sx oral or pharyngeal deficits.  Completed HEP with independence - req'd water more today than last session to complete x5 reps each.   01/04/24: Chicken, potatoes, corn, grits, cereal, fish in the last week. Pt was making headway towards supplementing with tube instead of with food before he had some fish that was very very spicy, and now has gone more supplementing with food. SLP encouraged him to wean more from tube and get back where he was prior to having the fish. Harry Williams shares that [place] (dietician) had also encouraged him to do that. Today SLP had pt eat peanut butter crackers and drank some water, without any overt s/sx oral or pharyngeal deficits. With HEP, pt's procedure was WNL. He has been performing HEP as directed since last session. SLP provided pt with overt s/sx aspiration PNA and pt repeated back to SLP with independence. SLP and pt agree pt can be seen again in two months.  12/02/23: Pt req'd min A usually with HEP today, independent by session end. He req'd max A for rationale for HEP.  He has attempted grapes and water at home, with good pharyngeal clearance. No coughing/wet voice ID 'd during those POs. Today SLP provided pt with applesauce and water without any overt s/sx oral or pharyngeal deficits. SLP  told pt he could safely eat these type of POs and encouraged him to cut his last PEG feeding in half to wake up slightly hungry and have grits, wet scrambled eggs/pureed eggs, slurried biscuits and gravy - all with liquid wash, or a carnation instant breakfast/Ensure/Boost PO. SLP encouraged him to discuss matching calorie intake between tube feed and PO with Ottie Blonder at next dietician visit on 12/09/23. SLP told pt if he begins to lose weight to keep all tube feeds unchanged from current regimen and just have POs in addition to tube feeds.\ Pt demo'd understanding of details and rationale of a food journal to SLP.  10/25/23 (eval): Research states the risk for dysphagia increases due to radiation and/or chemotherapy treatment due to a variety of factors, so SLP educated the pt about the possibility of reduced/limited ability for PO intake during rad tx. SLP also educated pt regarding possible changes to swallowing musculature after rad tx, and why adherence to dysphagia HEP provided today and PO consumption was necessary to inhibit muscle fibrosis following rad tx and to mitigate muscle disuse atrophy. SLP informed pt why this would be detrimental to their swallowing status and to their pulmonary health. Pt demonstrated understanding of these things to SLP. SLP encouraged pt to safely eat and drink as deep into their radiation/chemotherapy as possible to provide the best possible long-term swallowing outcome for pt.  SLP then developed an individualized HEP for pt involving oral and pharyngeal strengthening and ROM and pt was instructed how to perform these exercises, including SLP demonstration. After SLP demonstration, pt return demonstrated each exercise. SLP ensured pt performance was correct prior to educating pt on next exercise. Pt required min cues faded to modified independent to perform HEP. Pt was instructed to complete this program 5-7 days/week, at least 20 reps a day until 6 months after his or her  last day of rad tx, and then x2 a week after that, indefinitely. Among other modifications for days when pt cannot functionally swallow, SLP also suggested pt to perform only non-swallowing tasks  on the handout/HEP, and if necessary to cycle through the swallowing portion so the full program of exercises can be completed instead of fatiguing on one of the swallowing exercises and being unable to perform the other swallowing exercises. SLP instructed that swallowing exercises should then be added back into the regimen as pt is able to do so. Secondly, pt was told that former patients have told SLP that during their course of radiation therapy, taking prescribed pain medication just prior to performing HEP (and eating/drinking) has proven helpful in completing HEP (and eating and drinking) more regularly when going through their course of radiation treatment.    PATIENT EDUCATION: Education details: See treatment date for details Person educated: Patient Education method: Explanation and Handouts Education comprehension: verbalized understanding   ASSESSMENT:  CLINICAL IMPRESSION: Patient is a 43 y.o. M who was seen today for treatment of swallowing following completion of radiation/chemoradiation therapy. Today pt ate items from regular diet and drank thin liquids without overt s/s oral or pharyngeal difficulty. At this time pt swallowing is deemed WNL/WFL with these POs. There are no overt s/s aspiration PNA observed by SLP nor any reported by pt at this time. Data indicate that pt's swallow ability could very well decline over time following the conclusion of rad therapy due to muscle disuse atrophy and/or muscle fibrosis. Pt was discharged today.  OBJECTIVE IMPAIRMENTS: include dysphagia. These impairments are limiting patient from safety when swallowing. Factors affecting potential to achieve goals and functional outcome are none noted today. Patient will benefit from skilled SLP services to  address above impairments and improve overall function.  REHAB POTENTIAL: Good   GOALS: Goals reviewed with patient? No SHORT TERM GOALS: Target: 3rd total session   1. Pt will complete HEP with modified independence in 2 sessions Baseline: 01/04/24 Goal status: Paritally met   2.  pt will tell SLP why pt is completing HEP with modified independence Baseline:  Goal status: Met   3.  pt will describe 3 overt s/s aspiration PNA with modified independence Baseline:  Goal status: Met   4.  pt will tell SLP how a food journal could hasten return to a more normalized diet Baseline:  Goal status: Met     LONG TERM GOALS: Target: 7th total session   1.  pt will complete HEP with independence over two visits Baseline: 03/05/24 Goal status: met   2.  pt will describe how to modify HEP over time, and the timeline associated with reduction in HEP frequency with modified independence over two sessions Baseline:  Goal status: met     PLAN:   Discharge today   PLANNED INTERVENTIONS: Aspiration precaution training, Pharyngeal strengthening exercises, Diet toleration management , Trials of upgraded texture/liquids, SLP instruction and feedback, Compensatory strategies, and Patient/family education    [facility], CCC-SLP 05/14/2024, 10:59 AM

## 2024-05-14 NOTE — Patient Instructions (Signed)

## 2024-05-18 ENCOUNTER — Inpatient Hospital Stay: Attending: Oncology

## 2024-05-18 ENCOUNTER — Inpatient Hospital Stay: Admitting: Dietician

## 2024-05-18 VITALS — BP 104/69 | HR 73 | Temp 97.6°F | Resp 16

## 2024-05-18 DIAGNOSIS — C090 Malignant neoplasm of tonsillar fossa: Secondary | ICD-10-CM | POA: Insufficient documentation

## 2024-05-18 DIAGNOSIS — Z452 Encounter for adjustment and management of vascular access device: Secondary | ICD-10-CM | POA: Diagnosis present

## 2024-05-18 DIAGNOSIS — Z95828 Presence of other vascular implants and grafts: Secondary | ICD-10-CM

## 2024-05-18 MED ORDER — SODIUM CHLORIDE 0.9% FLUSH
10.0000 mL | Freq: Once | INTRAVENOUS | Status: AC
  Administered 2024-05-18: 10 mL

## 2024-05-18 MED ORDER — HEPARIN SOD (PORK) LOCK FLUSH 100 UNIT/ML IV SOLN
250.0000 [IU] | Freq: Once | INTRAVENOUS | Status: AC
  Administered 2024-05-18: 250 [IU]

## 2024-05-18 NOTE — Progress Notes (Signed)
 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 Chemotherapy discontinued secondary to side effects. S/p PEG 10/24.    3/3-3/15 admission with SBO  Met with patient in office for wt check. Pt reports eating 3 meals daily orally. Recalls 2 breakfast burritos, chick fila nuggets + chick fila sandwich, coke for lunch. Ate half medium thin crust pepperoni pizza for dinner. Drinking mostly water. Pt did try fairlife chocolate milk which he liked. He has not been using tube consistently for the last few weeks. Reports supplementing with no more than one carton when he has used it. Patient does give daily FWF. Patient denies nausea, vomiting. Reports bowel urgency after meals s/p small bowel resection in March.    Medications: reviewed  Labs: no recent labs for review  Anthropometrics: Wt 145.8 lb today in RD office increased from 143.8 lb on 5/9  4/9 - 148.2 lb    NUTRITION DIAGNOSIS: Inadequate oral intake improved    INTERVENTION:  Pt will d/c occasional bolus feedings - continue daily FWF Continue high calorie high protein foods for wt maintenance/gain Suggested smaller meals (4-6/day) vs 3 larger meals to aid with bowel urgency s/p SB resection Recommend daily MVI   MONITORING, EVALUATION, GOAL: wt trends, intake   NEXT VISIT: Wednesday July 2 after MD for wt check

## 2024-05-21 ENCOUNTER — Ambulatory Visit (HOSPITAL_COMMUNITY)
Admission: RE | Admit: 2024-05-21 | Discharge: 2024-05-21 | Disposition: A | Discharge status: Home / Self Care | Source: Ambulatory Visit | Attending: Oncology | Admitting: Oncology

## 2024-05-21 DIAGNOSIS — C090 Malignant neoplasm of tonsillar fossa: Secondary | ICD-10-CM | POA: Diagnosis present

## 2024-05-21 DIAGNOSIS — I82C11 Acute embolism and thrombosis of right internal jugular vein: Secondary | ICD-10-CM | POA: Insufficient documentation

## 2024-05-21 MED ORDER — IOHEXOL 300 MG/ML  SOLN
75.0000 mL | Freq: Once | INTRAMUSCULAR | Status: AC | PRN
  Administered 2024-05-21: 75 mL via INTRAVENOUS

## 2024-05-21 MED ORDER — SODIUM CHLORIDE (PF) 0.9 % IJ SOLN
INTRAMUSCULAR | Status: AC
  Filled 2024-05-21: qty 50

## 2024-05-30 ENCOUNTER — Inpatient Hospital Stay: Admitting: Dietician

## 2024-05-30 ENCOUNTER — Other Ambulatory Visit: Payer: Self-pay

## 2024-05-30 ENCOUNTER — Inpatient Hospital Stay (HOSPITAL_BASED_OUTPATIENT_CLINIC_OR_DEPARTMENT_OTHER): Admitting: Oncology

## 2024-05-30 ENCOUNTER — Inpatient Hospital Stay

## 2024-05-30 ENCOUNTER — Encounter: Payer: Self-pay | Admitting: Oncology

## 2024-05-30 ENCOUNTER — Inpatient Hospital Stay: Attending: Oncology

## 2024-05-30 ENCOUNTER — Other Ambulatory Visit: Payer: Self-pay | Admitting: Oncology

## 2024-05-30 VITALS — BP 108/73 | HR 81 | Temp 98.1°F | Resp 17 | Ht 69.0 in | Wt 148.9 lb

## 2024-05-30 DIAGNOSIS — Z9221 Personal history of antineoplastic chemotherapy: Secondary | ICD-10-CM | POA: Diagnosis not present

## 2024-05-30 DIAGNOSIS — C090 Malignant neoplasm of tonsillar fossa: Secondary | ICD-10-CM

## 2024-05-30 DIAGNOSIS — Z79899 Other long term (current) drug therapy: Secondary | ICD-10-CM | POA: Insufficient documentation

## 2024-05-30 DIAGNOSIS — I82C11 Acute embolism and thrombosis of right internal jugular vein: Secondary | ICD-10-CM | POA: Diagnosis not present

## 2024-05-30 DIAGNOSIS — Z86718 Personal history of other venous thrombosis and embolism: Secondary | ICD-10-CM | POA: Insufficient documentation

## 2024-05-30 DIAGNOSIS — Z923 Personal history of irradiation: Secondary | ICD-10-CM | POA: Insufficient documentation

## 2024-05-30 DIAGNOSIS — Z7901 Long term (current) use of anticoagulants: Secondary | ICD-10-CM | POA: Insufficient documentation

## 2024-05-30 LAB — CBC WITH DIFFERENTIAL (CANCER CENTER ONLY)
Abs Immature Granulocytes: 0 10*3/uL (ref 0.00–0.07)
Basophils Absolute: 0 10*3/uL (ref 0.0–0.1)
Basophils Relative: 1 %
Eosinophils Absolute: 0.1 10*3/uL (ref 0.0–0.5)
Eosinophils Relative: 2 %
HCT: 35.3 % — ABNORMAL LOW (ref 39.0–52.0)
Hemoglobin: 12.3 g/dL — ABNORMAL LOW (ref 13.0–17.0)
Immature Granulocytes: 0 %
Lymphocytes Relative: 17 %
Lymphs Abs: 0.4 10*3/uL — ABNORMAL LOW (ref 0.7–4.0)
MCH: 29.7 pg (ref 26.0–34.0)
MCHC: 34.8 g/dL (ref 30.0–36.0)
MCV: 85.3 fL (ref 80.0–100.0)
Monocytes Absolute: 0.4 10*3/uL (ref 0.1–1.0)
Monocytes Relative: 14 %
Neutro Abs: 1.8 10*3/uL (ref 1.7–7.7)
Neutrophils Relative %: 66 %
Platelet Count: 158 10*3/uL (ref 150–400)
RBC: 4.14 MIL/uL — ABNORMAL LOW (ref 4.22–5.81)
RDW: 12.6 % (ref 11.5–15.5)
WBC Count: 2.7 10*3/uL — ABNORMAL LOW (ref 4.0–10.5)
nRBC: 0 % (ref 0.0–0.2)

## 2024-05-30 LAB — CMP (CANCER CENTER ONLY)
ALT: 10 U/L (ref 0–44)
AST: 14 U/L — ABNORMAL LOW (ref 15–41)
Albumin: 4.3 g/dL (ref 3.5–5.0)
Alkaline Phosphatase: 54 U/L (ref 38–126)
Anion gap: 6 (ref 5–15)
BUN: 9 mg/dL (ref 6–20)
CO2: 28 mmol/L (ref 22–32)
Calcium: 9.8 mg/dL (ref 8.9–10.3)
Chloride: 107 mmol/L (ref 98–111)
Creatinine: 0.96 mg/dL (ref 0.61–1.24)
GFR, Estimated: >60 mL/min (ref >60)
Glucose, Bld: 132 mg/dL — ABNORMAL HIGH (ref 70–99)
Potassium: 3.9 mmol/L (ref 3.5–5.1)
Sodium: 141 mmol/L (ref 135–145)
Total Bilirubin: 0.4 mg/dL (ref 0.0–1.2)
Total Protein: 7 g/dL (ref 6.5–8.1)

## 2024-05-30 LAB — MAGNESIUM: Magnesium: 2.1 mg/dL (ref 1.7–2.4)

## 2024-05-30 LAB — D-DIMER, QUANTITATIVE: D-Dimer, Quant: <0.27 ug{FEU}/mL (ref 0.00–0.50)

## 2024-05-30 LAB — TSH: TSH: 4.03 u[IU]/mL (ref 0.350–4.500)

## 2024-05-30 NOTE — Assessment & Plan Note (Addendum)
 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  Previously was on Lovenox  and later switched to Eliquis .  Patient stopped the medication for a few weeks because of affordability issues.  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  He was started back on Eliquis  5 mg p.o. twice daily.  He is able to get the medication now.  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  We will continue Eliquis  but reduce to prophylactic dosing of 2.5 mg twice daily and reevaluate in 4 months for follow-up.  D-dimer was undetectable today.  Given no evidence of disease on recent laryngoscopy, we submitted request for Port-A-Cath removal.  Hopefully this will also help in clot resolution.

## 2024-05-30 NOTE — Progress Notes (Signed)
 MD ordered labs that could not be drawn from port-a-cath. Lab appt made.

## 2024-05-30 NOTE — Progress Notes (Signed)
 [facility]  ONCOLOGY CLINIC PROGRESS NOTE   Patient Care Team: [name], MD as PCP - General (Family Medicine) [name], MD as Referring Physician (Otolaryngology) [name], MD as Attending Physician (Radiation Oncology) [name], [name], RN as Oncology Nurse Navigator [name], MD as Consulting Physician (Oncology)  Date of visit: 05/30/2024   ASSESSMENT & PLAN:   Very pleasant 43 y.o. 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  Treated with concurrent chemoradiation with weekly cisplatin  starting from 09/29/2023.  Received 4 doses of weekly cisplatin .  Rest of the chemo was held because of neutropenia and also per patient preference.  Malignant neoplasm of tonsillar fossa (HCC) - Hx of left tonsillar squamous cell carcinoma with a 3 cm lymph node. cT2,cN1,cM0, p16+.   - Previously discussed the benefits and risks of radiation alone vs combination of chemotherapy and radiation. Given the patient's age and the size of the lymph node which is borderline at 3 cm, the benefits of adding chemotherapy slightly outweigh the risks. After discussing side effect profile, patient did opt for weekly cisplatin .  Plan made to proceed with weekly cisplatin  at 40 mg/m dose during the course of radiation. He started radiation treatments from 09/27/2023.  Started first dose of cisplatin  on 09/29/2023.  -On the same day that he received first dose of cisplatin , he developed visual changes, described as a 3D effect and double vision. Although very rare, optic neuritis has been reported with cisplatin  use on literature review, especially when used in combination with other chemotherapeutic agents.  Though it is rare to happen with just 1 dose  of cisplatin , given his symptoms, an ophthalmology referral was placed and we held Cisplatin  on 10/05/23. [name] with ophthalmology evaluated patient on 10/06/2023.  No signs of optic neuritis. Patient was cleared to resume cisplatin .  - We resumed cisplatin  from 10/14/2023 and continued this weekly during the course of radiation.  - He received total of 4 doses of cisplatin . He was hospitalized on 11/01/2023 for neutropenic fever, pain from mucositis.  ANC slowly improved.  Infectious workup was negative.  He was discharged home on 11/05/2023.  -Given issues with neutropenia and the fact that his mucositis was getting worse with chemotherapy, patient opted to not receive further chemotherapy. He completed radiation treatments on 11/16/2023.    -He is slowly recovering from treatment-related side effects.  Swallowing function has been improving.  No longer has mouth sores.  Restaging PET scan on 02/20/2024 showed no evidence of residual carcinoma in the oropharynx or hypopharynx.  No hypermetabolic cervical lymph nodes.  Incidentally noted was new rounded intense radiotracer activity in the lateral right abdomen adjacent to bowel and liver, favoring abscess formation related to recent bowel surgery.  Patient had CT abdomen and pelvis for further evaluation scheduled on 02/27/2024 followed by appointment with Dr. [name], surgeon.  Eventually this was managed by IR and no issues currently.  He had recent ENT evaluation with Dr. [name] at [facility] on 05/23/2024.  Flexible laryngoscopy showed no evidence of disease.  Will continue surveillance as per NCCN guidelines for his tonsillar cancer.  RTC in 4 months for follow-up with repeat labs.   Internal jugular (IJ) vein thromboembolism, acute, right (HCC) Blood clot near the port site discovered in late December 2024.  Previously was on Lovenox  and later switched to Eliquis .  Patient stopped the medication for a few weeks because of  affordability issues.  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  He was started back on Eliquis  5 mg p.o. twice daily.  He is able to get the medication now.  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  We will continue Eliquis  but reduce to prophylactic dosing of 2.5 mg twice daily and reevaluate in 4 months for follow-up.  D-dimer was undetectable today.  Given no evidence of disease on recent laryngoscopy, we submitted request for Port-A-Cath removal.  Hopefully this will also help in clot resolution.  Leukopenia White blood cell count remains low, slower than expected recovery. Hemoglobin and red blood cell count have improved. Possible contribution from hemorrhoid bleeding. - Monitor white blood cell count at next visit   I reviewed lab results and outside records for this visit and discussed relevant results with the patient. Diagnosis, plan of care and treatment options were also discussed in detail with the patient. Opportunity provided to ask questions and answers provided to his apparent satisfaction. Provided instructions to call our clinic with any problems, questions or concerns prior to return visit. I recommended to continue follow-up with PCP and sub-specialists. He verbalized understanding and agreed with the plan.   NCCN guidelines have been consulted in the planning of this patient's care.  I provided 30 minutes of face-to-face time during this encounter and > 50% was spent counseling as documented under my assessment and plan.    Chinita Patten, MD  05/30/2024 5:10 PM  [place] [facility] [address] [place] [address] Dept: [phone] Dept Fax: [phone]    CHIEF COMPLAINT/ REASON FOR VISIT:   Recently diagnosed squamous of carcinoma of left tonsillar fossa,  p16 positive.  Current Treatment: Received chemoradiation with weekly cisplatin , chemo started from 09/29/2023.  Chemotherapy complicated by neutropenia, neutropenic fever.  He received only 4 of the planned 7 cycles of cisplatin .  INTERVAL HISTORY:   Discussed the use of AI scribe software for clinical note transcription with the patient, who gave verbal consent to proceed.  History of Present Illness Bach Rocchi is a 43 year old male with a history of cancer who presents for follow-up regarding his treatment and current symptoms.  He has experienced significant weight loss, dropping from 185 pounds to 148 pounds. Despite this, he has no issues with swallowing or eating and denies any pain associated with these activities. He is not yet back to work full-time.  A recent CT scan showed a small amount of fluid collection in the area of previous treatment and a persistent clot in the right neck vein. He underwent a PET scan in March. He is currently taking Eliquis , a blood thinner, twice a day, and has been on it for about six months with some breaks. His wife has managed to help with the cost, though it may not be sustainable.  He feels discomfort with his port and wants it removed. He is rarely using his feeding tube and wishes for its removal as well. He follows dietary recommendations from a nutritionist, including eating small meals multiple times a day.  He experiences 'weird sensations' in his shoulder and difficulty with the range of motion, particularly reaching back, but no significant pain, describing  it more as discomfort.  He has a hemorrhoid that is bleeding, which he wonders might be affecting his blood counts. He has an upcoming appointment with a genetic counselor on July 29th.   I have reviewed the past medical history, past surgical history, social history and family history with the patient and they are unchanged from previous note.  ALLERGIES: He has no known  allergies.  MEDICATIONS:  Current Outpatient Medications  Medication Sig Dispense Refill   apixaban  (ELIQUIS ) 5 MG TABS tablet Take 1 tablet (5 mg total) by mouth 2 (two) times daily. 60 tablet 3   celecoxib  (CELEBREX ) 200 MG capsule Place 1 capsule (200 mg total) into feeding tube daily as needed for moderate pain (pain score 4-6). 90 capsule 0   Nutritional Supplements (FEEDING SUPPLEMENT, OSMOLITE 1.5 CAL,) LIQD 7 cartons Osmolite 1.5 split over four feeding daily. Flush tube with 60 ml water before and after each bolus. Provide additional 3 1/2 cups (830 ml) water/day to meet hydration needs. Provides 2485 kcal, 104.3 g protein, 1267 ml free water from formula (2577 ml total water with flushes). 1659 ml/day meets 100% RDI     OLANZapine  (ZYPREXA ) 5 MG tablet Take 1 tablet (5 mg total) by mouth at bedtime. 30 tablet 2   PARoxetine  (PAXIL  CR) 12.5 MG 24 hr tablet Take 1 tablet (12.5 mg total) by mouth daily. (Patient not taking: Reported on 05/30/2024) 30 tablet 0   No current facility-administered medications for this visit.    HISTORY OF PRESENT ILLNESS:   Oncology History  Malignant neoplasm of tonsillar fossa (HCC)  07/20/2023 Imaging   CT neck soft tissue: 2.5 x 3 cm mass in the left tonsillar region with extension towards the tongue base, suspicious for carcinoma, and evidence of level 2 lymphadenopathy on the left, consisting of a dominant node measuring 3 x 2 x 1.7 cm. A benign appearing osteoma was also demonstrated, projecting inferior from the angle of the left mandible.    08/26/2023 Initial Diagnosis   Malignant neoplasm of tonsillar fossa [facility]):  He presented to his PCP for routine follow-up in July 2024 and was incidentally noted to have left submandibular swelling on a routine examination performed at that time. A soft tissue head and neck ultrasound was subsequently performed on 07/07/23 which showed an ovoid mass measuring 2.8 x 1.9 cm, most consistent with an abnormally  enlarged lymph node, and correlating with the palpable area in the left submandibular region.    A soft tissue neck CT was also performed on 07/20/23 which demonstrated: a  2.5 x 3 cm mass in the left tonsillar region with extension towards the tongue base, suspicious for carcinoma, and evidence of level 2 lymphadenopathy on the left, consisting of a dominant node measuring 3 x 2 x 1.7 cm. A benign appearing osteoma was also demonstrated, projecting inferior from the angle of the left mandible.   Subsequently, the patient was referred to Dr. Denys at [facility] and underwent biopsies of the left tonsil on 08/26/23. 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    08/26/2023 Pathology Results   Patient was referred to Dr. Denys at [facility] and underwent biopsies of the left tonsil on 08/26/23. 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    09/07/2023 PET scan   PET scan performed on 09/07/23 at [facility]: intense radiotracer uptake within the left palatine tonsillar mass; adjacent radiotracer uptake within the region of  the right palatine tonsil; and hypermetabolic nodes in the left neck extending to the supraclavicular region concerning for nodal metastases. Focal uptake at the anorectal junction was also demonstrated, possibly reflecting muscular contraction, however local malignancy can not be excluded. PET otherwise showed no evidence of more distant metastatic disease.    09/14/2023 Cancer Staging   Staging form: Pharynx - HPV-Mediated Oropharynx, AJCC 8th Edition - Clinical stage from 09/14/2023: Stage I (cT2, cN1, cM0, p16+) - Signed by [name], MD on 09/14/2023 Stage prefix: Initial diagnosis   09/29/2023 - 10/21/2023 Chemotherapy   Patient is on Treatment Plan : HEAD/NECK Cisplatin  (40) q7d         REVIEW OF SYSTEMS:   Review of Systems - Oncology  All  other pertinent systems were reviewed with the patient and are negative.   VITALS:   Blood pressure 108/73, pulse 81, temperature 98.1 F (36.7 C), temperature source Temporal, resp. rate 17, height 5' 9 (1.753 m), weight 148 lb 14.4 oz (67.5 kg), SpO2 96%.  Wt Readings from Last 3 Encounters:  05/30/24 148 lb 14.4 oz (67.5 kg)  05/18/24 145 lb 12.8 oz (66.1 kg)  03/07/24 148 lb 3.2 oz (67.2 kg)    Body mass index is 21.99 kg/m.   Onc Performance Status - 05/30/24 1450       ECOG Perf Status   ECOG Perf Status Restricted in physically strenuous activity but ambulatory and able to carry out work of a light or sedentary nature, e.g., light house work, office work      KPS SCALE   KPS % SCORE Normal activity with effort, some s/s of disease          PHYSICAL EXAM:   Physical Exam Constitutional:      General: He is not in acute distress.    Appearance: Normal appearance.  HENT:     Head: Normocephalic and atraumatic.     Mouth/Throat:     Mouth: Mucous membranes are moist.     Comments: No evidence of erythema or thrush Eyes:     General: No scleral icterus.    Conjunctiva/sclera: Conjunctivae normal.  Cardiovascular:     Rate and Rhythm: Normal rate and regular rhythm.     Pulses: Normal pulses.     Heart sounds: Normal heart sounds.  Pulmonary:     Effort: Pulmonary effort is normal.     Breath sounds: Normal breath sounds.  Chest:     Comments: Right-sided Port-A-Cath in place without any signs of infection Abdominal:     General: There is no distension.  Musculoskeletal:     Right lower leg: No edema.     Left lower leg: No edema.  Lymphadenopathy:     Cervical: No cervical adenopathy.  Skin:    Findings: No rash.  Neurological:     General: No focal deficit present.     Mental Status: He is alert and oriented to person, place, and time.  Psychiatric:        Mood and Affect: Mood normal.        Behavior: Behavior normal.        Thought Content:  Thought content normal.      LABORATORY DATA:   I have reviewed the data as listed.  Results for orders placed or performed in visit on 05/30/24  D-dimer, quantitative  Result Value Ref Range   D-Dimer, Quant <0.27 0.00 - 0.50 ug/mL-FEU  Magnesium   Result Value Ref Range   Magnesium  2.1 1.7 -  2.4 mg/dL  CMP ([facility] only)  Result Value Ref Range   Sodium 141 135 - 145 mmol/L   Potassium 3.9 3.5 - 5.1 mmol/L   Chloride 107 98 - 111 mmol/L   CO2 28 22 - 32 mmol/L   Glucose, Bld 132 (H) 70 - 99 mg/dL   BUN 9 6 - 20 mg/dL   Creatinine 9.03 9.38 - 1.24 mg/dL   Calcium  9.8 8.9 - 10.3 mg/dL   Total Protein 7.0 6.5 - 8.1 g/dL   Albumin 4.3 3.5 - 5.0 g/dL   AST 14 (L) 15 - 41 U/L   ALT 10 0 - 44 U/L   Alkaline Phosphatase 54 38 - 126 U/L   Total Bilirubin 0.4 0.0 - 1.2 mg/dL   GFR, Estimated >39 >39 mL/min   Anion gap 6 5 - 15  CBC with Differential ([facility] Only)  Result Value Ref Range   WBC Count 2.7 (L) 4.0 - 10.5 K/uL   RBC 4.14 (L) 4.22 - 5.81 MIL/uL   Hemoglobin 12.3 (L) 13.0 - 17.0 g/dL   HCT 64.6 (L) 60.9 - 47.9 %   MCV 85.3 80.0 - 100.0 fL   MCH 29.7 26.0 - 34.0 pg   MCHC 34.8 30.0 - 36.0 g/dL   RDW 87.3 88.4 - 84.4 %   Platelet Count 158 150 - 400 K/uL   nRBC 0.0 0.0 - 0.2 %   Neutrophils Relative % 66 %   Neutro Abs 1.8 1.7 - 7.7 K/uL   Lymphocytes Relative 17 %   Lymphs Abs 0.4 (L) 0.7 - 4.0 K/uL   Monocytes Relative 14 %   Monocytes Absolute 0.4 0.1 - 1.0 K/uL   Eosinophils Relative 2 %   Eosinophils Absolute 0.1 0.0 - 0.5 K/uL   Basophils Relative 1 %   Basophils Absolute 0.0 0.0 - 0.1 K/uL   Immature Granulocytes 0 %   Abs Immature Granulocytes 0.00 0.00 - 0.07 K/uL      RADIOGRAPHIC STUDIES:  I have personally reviewed the radiological images as listed and agree with the findings in the report.  CT Soft Tissue Neck W Contrast Result Date: 05/30/2024 CLINICAL DATA:  Patient with head and neck cancer, needs follow-up of right IJ  thrombus status. EXAM: CT NECK WITH CONTRAST TECHNIQUE: Multidetector CT imaging of the neck was performed using the standard protocol following the bolus administration of intravenous contrast. RADIATION DOSE REDUCTION: This exam was performed according to the departmental dose-optimization program which includes automated exposure control, adjustment of the mA and/or kV according to patient size and/or use of iterative reconstruction technique. CONTRAST:  75mL OMNIPAQUE  IOHEXOL  300 MG/ML  SOLN COMPARISON:  CT of the neck dated February 10, 2024. FINDINGS: Pharynx and larynx: There is mild residual retropharyngeal fluid, as before. There is no peripheral enhancement of the fluid. The pharyngeal and laryngeal soft tissues are otherwise unremarkable. Salivary glands: No inflammation, mass, or stone. Thyroid : Normal. Lymph nodes: There are few shotty cervical lymph nodes. There are no pathologically enlarged or morphologically suspicious nodes present. 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 There is compensatory hypertrophy of the right external jugular vein. The arteries are patent and unremarkable. Limited intracranial: Negative. Visualized orbits: Normal. Mastoids and visualized paranasal sinuses: Clear throughout. Skeleton: No osseous lesions. The anterior and posterior arches of C1 are nonunited. Upper chest: The lung apices are clear. Other: A right internal jugular chest port is present. IMPRESSION: 1. 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 2. Small retropharyngeal effusion, without significant change. Electronically Signed   By: Evalene Coho M.D.   On: 05/30/2024 13:58    CODE STATUS:  Code Status History     Date Active Date Inactive Code Status Order ID Comments User Context   09/22/2023 1045 09/23/2023 0514 Full Code [id number]  Hughes Simmonds, MD [facility]   09/22/2023 1045 09/22/2023 1045 Full Code [id number]  Hughes Simmonds, MD [facility]    Questions for Most Recent Historical Code Status (Order [id number])     Question Answer   By: Consent: discussion documented in EHR             Future Appointments  Date Time Provider Department [facility]  06/20/2024  8:40 AM [name], MD [facility]  06/26/2024  1:00 PM Flippin, Con DEL, Counselor [facility] None  06/26/2024  2:00 PM [facility] LAB [facility] None  06/29/2024  3:45 PM [facility] FLUSH [facility] None  10/03/2024  2:15 PM [facility] LAB [facility] None  10/03/2024  2:45 PM Geraline Halberstadt, MD [facility] None  11/06/2024 10:30 AM [facility] None  11/06/2024 11:00 AM Wyatt Leeroy HERO, PA-C [facility] None     This document was completed utilizing speech recognition software. Grammatical errors, random word insertions, pronoun errors, and incomplete sentences are an occasional consequence of this system due to software limitations, ambient noise, and hardware issues. Any formal questions or concerns about the content, text or information contained within the body of this dictation should be directly addressed to the provider for clarification.

## 2024-05-30 NOTE — Assessment & Plan Note (Addendum)
-   Hx of left tonsillar squamous cell carcinoma with a 3 cm lymph node. cT2,cN1,cM0, p16+.   - Previously discussed the benefits and risks of radiation alone vs combination of chemotherapy and radiation. Given the patient's age and the size of the lymph node which is borderline at 3 cm, the benefits of adding chemotherapy slightly outweigh the risks. After discussing side effect profile, patient did opt for weekly cisplatin .  Plan made to proceed with weekly cisplatin  at 40 mg/m dose during the course of radiation. He started radiation treatments from 09/27/2023.  Started first dose of cisplatin  on 09/29/2023.  -On the same day that he received first dose of cisplatin , he developed visual changes, described as a 3D effect and double vision. Although very rare, optic neuritis has been reported with cisplatin  use on literature review, especially when used in combination with other chemotherapeutic agents.  Though it is rare to happen with just 1 dose of cisplatin , given his symptoms, an ophthalmology referral was placed and we held Cisplatin  on 10/05/23. [name] with ophthalmology evaluated patient on 10/06/2023.  No signs of optic neuritis. Patient was cleared to resume cisplatin .  - We resumed cisplatin  from 10/14/2023 and continued this weekly during the course of radiation.  - He received total of 4 doses of cisplatin . He was hospitalized on 11/01/2023 for neutropenic fever, pain from mucositis.  ANC slowly improved.  Infectious workup was negative.  He was discharged home on 11/05/2023.  -Given issues with neutropenia and the fact that his mucositis was getting worse with chemotherapy, patient opted to not receive further chemotherapy. He completed radiation treatments on 11/16/2023.    -He is slowly recovering from treatment-related side effects.  Swallowing function has been improving.  No longer has mouth sores.  Restaging PET scan on 02/20/2024 showed no evidence of residual carcinoma in the  oropharynx or hypopharynx.  No hypermetabolic cervical lymph nodes.  Incidentally noted was new rounded intense radiotracer activity in the lateral right abdomen adjacent to bowel and liver, favoring abscess formation related to recent bowel surgery.  Patient had CT abdomen and pelvis for further evaluation scheduled on 02/27/2024 followed by appointment with Dr. [name], surgeon.  Eventually this was managed by IR and no issues currently.  He had recent ENT evaluation with Dr. [name] at [facility] on 05/23/2024.  Flexible laryngoscopy showed no evidence of disease.  Will continue surveillance as per NCCN guidelines for his tonsillar cancer.  RTC in 4 months for follow-up with repeat labs.

## 2024-05-30 NOTE — Progress Notes (Signed)
 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 Chemotherapy discontinued secondary to side effects. S/p PEG 10/24.    3/3-3/15 admission with SBO s/p resection  Met with pt in office following MD visit. Pt doing well today. He continues eating and drinking well by mouth. He has not used tube other than FWF in the last 3-4 weeks. His weight increased today. Patient is trying to eat smaller meals more often. Reports this is helping with frequency of BM. He denies NIS.    Medications: reviewed   Labs: reviewed   Anthropometrics: Wt 148 lb 14.4 oz today increased   6/20 - 145.8 lb 5/9 - 143.8 lb  4/9 - 148.2 lb   NUTRITION DIAGNOSIS: Inadequate oral intake - resolved    INTERVENTION:  D/c feeding tube - Dr. Autumn has placed orders Encourage small frequent meals 4-6/day, limiting intake of greasy fried foods - Dumping syndrome handout with tips and food ideas provided Recommend daily MVI Contact information provided     MONITORING, EVALUATION, GOAL: wt trends, intake   NEXT VISIT: No follow-up scheduled. Pt has contact information, encouraged to call with nutrition questions/concerns

## 2024-06-08 ENCOUNTER — Ambulatory Visit (HOSPITAL_COMMUNITY)
Admission: RE | Admit: 2024-06-08 | Discharge: 2024-06-08 | Disposition: A | Discharge status: Home / Self Care | Source: Ambulatory Visit | Attending: Oncology

## 2024-06-08 DIAGNOSIS — Z431 Encounter for attention to gastrostomy: Secondary | ICD-10-CM | POA: Insufficient documentation

## 2024-06-08 DIAGNOSIS — C090 Malignant neoplasm of tonsillar fossa: Secondary | ICD-10-CM | POA: Insufficient documentation

## 2024-06-08 DIAGNOSIS — Z452 Encounter for adjustment and management of vascular access device: Secondary | ICD-10-CM | POA: Insufficient documentation

## 2024-06-08 HISTORY — PX: IR GASTROSTOMY TUBE REMOVAL: IMG5492

## 2024-06-08 HISTORY — PX: IR REMOVAL TUN ACCESS W/ PORT W/O FL MOD SED: IMG2290

## 2024-06-08 MED ORDER — LIDOCAINE HCL 1 % IJ SOLN
INTRAMUSCULAR | Status: AC
  Filled 2024-06-08: qty 20

## 2024-06-08 MED ORDER — LIDOCAINE HCL 1 % IJ SOLN
20.0000 mL | Freq: Once | INTRAMUSCULAR | Status: AC
  Administered 2024-06-08: 3 mL via INTRADERMAL

## 2024-06-20 ENCOUNTER — Encounter: Payer: Self-pay | Admitting: Family Medicine

## 2024-06-20 ENCOUNTER — Ambulatory Visit (INDEPENDENT_AMBULATORY_CARE_PROVIDER_SITE_OTHER): Payer: Self-pay | Admitting: Family Medicine

## 2024-06-20 VITALS — BP 100/62 | HR 80 | Ht 69.0 in | Wt 149.6 lb

## 2024-06-20 DIAGNOSIS — E559 Vitamin D deficiency, unspecified: Secondary | ICD-10-CM

## 2024-06-20 DIAGNOSIS — K219 Gastro-esophageal reflux disease without esophagitis: Secondary | ICD-10-CM

## 2024-06-20 DIAGNOSIS — D649 Anemia, unspecified: Secondary | ICD-10-CM

## 2024-06-20 DIAGNOSIS — G4701 Insomnia due to medical condition: Secondary | ICD-10-CM

## 2024-06-20 DIAGNOSIS — Z136 Encounter for screening for cardiovascular disorders: Secondary | ICD-10-CM | POA: Diagnosis not present

## 2024-06-20 DIAGNOSIS — R7309 Other abnormal glucose: Secondary | ICD-10-CM

## 2024-06-20 DIAGNOSIS — Z0000 Encounter for general adult medical examination without abnormal findings: Secondary | ICD-10-CM

## 2024-06-20 DIAGNOSIS — D369 Benign neoplasm, unspecified site: Secondary | ICD-10-CM

## 2024-06-20 DIAGNOSIS — R5382 Chronic fatigue, unspecified: Secondary | ICD-10-CM

## 2024-06-20 DIAGNOSIS — K648 Other hemorrhoids: Secondary | ICD-10-CM

## 2024-06-20 DIAGNOSIS — N400 Enlarged prostate without lower urinary tract symptoms: Secondary | ICD-10-CM

## 2024-06-20 DIAGNOSIS — I82C11 Acute embolism and thrombosis of right internal jugular vein: Secondary | ICD-10-CM

## 2024-06-20 DIAGNOSIS — F339 Major depressive disorder, recurrent, unspecified: Secondary | ICD-10-CM | POA: Insufficient documentation

## 2024-06-20 DIAGNOSIS — N528 Other male erectile dysfunction: Secondary | ICD-10-CM

## 2024-06-20 MED ORDER — TRIAMCINOLONE ACETONIDE 0.1 % EX CREA
TOPICAL_CREAM | CUTANEOUS | 1 refills | Status: AC
Start: 2024-06-20 — End: ?

## 2024-06-20 MED ORDER — TRAZODONE HCL 50 MG PO TABS: 25.0000 mg | ORAL_TABLET | Freq: Every evening | ORAL | 0 refills | Status: AC | PRN

## 2024-06-20 MED ORDER — SILDENAFIL CITRATE 50 MG PO TABS: 50.0000 mg | ORAL_TABLET | Freq: Every day | ORAL | 0 refills | Status: AC | PRN

## 2024-06-20 MED ORDER — AUVELITY 45-105 MG PO TBCR: 1.0000 | EXTENDED_RELEASE_TABLET | Freq: Two times a day (BID) | ORAL | 3 refills | Status: AC

## 2024-06-20 NOTE — Telephone Encounter (Signed)
 Please review.  KP

## 2024-06-21 ENCOUNTER — Encounter: Payer: Self-pay | Admitting: Family Medicine

## 2024-06-21 ENCOUNTER — Ambulatory Visit: Admitting: Pulmonary Disease

## 2024-06-21 DIAGNOSIS — R5382 Chronic fatigue, unspecified: Secondary | ICD-10-CM | POA: Insufficient documentation

## 2024-06-21 DIAGNOSIS — N528 Other male erectile dysfunction: Secondary | ICD-10-CM | POA: Insufficient documentation

## 2024-06-21 NOTE — Assessment & Plan Note (Signed)
 Referral to GI placed

## 2024-06-21 NOTE — Assessment & Plan Note (Signed)
 Insomnia Chronic insomnia with difficulty maintaining sleep, likely related to depressive symptoms. Potential sleep apnea discussed. - Prescribe trazodone  50 mg at bedtime, option to start at half tablet and increase as needed up to two tablets.  (25-100 mg nightly as needed) - Refer to sleep medicine for evaluation of possible sleep apnea.

## 2024-06-21 NOTE — Progress Notes (Signed)
 Primary Care / Sports Medicine Office Visit  Patient Information:  Patient ID: Harry Williams, male DOB: [birth year] Age: 43 y.o. MRN: [id number]   Harry Williams is a pleasant 43 y.o. male presenting with the following:  Chief Complaint  Patient presents with   Annual Exam    Patient presents today for his annual exam. He is doing well today and would like to discuss his energy level and hemorrhoids.    Vitals:   06/20/24 0839  BP: 100/62  Pulse: 80  SpO2: 98%   Vitals:   06/20/24 0839  Weight: 149 lb 9.6 oz (67.9 kg)  Height: 5' 9 (1.753 m)   Body mass index is 22.09 kg/m.     Independent interpretation of notes and tests performed by another provider:   None  Procedures performed:   None  Pertinent History, Exam, Impression, and Recommendations:   Problem List Items Addressed This Visit     Adenomatous polyp   Referral to GI placed.      Chronic fatigue   Chronic fatigue Mild anemia likely secondary to recent surgery and possible chronic blood loss.  Evaluation for sleep apnea recommended. - Order labs to check B12, iron, thyroid , and vitamin D  levels. - Referral to GI and sleep medicine placed      Relevant Orders   CBC   VITAMIN D  25 Hydroxy (Vit-D Deficiency, Fractures)   TSH   Iron, TIBC and Ferritin Panel   B12 and Folate Panel   Ambulatory referral to Pulmonology   Episode of recurrent major depressive disorder (HCC)   Mood disturbance and insomnia - Low energy and anhedonia - Difficulty sleeping with frequent awakenings and trouble returning to sleep - No current use of paroxetine  or olanzapine   Depression Depressive symptoms with low energy and lack of interest. Discussed starting Auvelity , expected improvement within two weeks. Potential side effects reviewed. - Prescribe Auvelity , start with one pill daily for three days, then increase to one pill twice daily. - Provide samples of Auvelity  for immediate start. - Monitor for side  effects such as lightheadedness.  Contact our office if the symptoms persist.      Relevant Medications   Dextromethorphan-buPROPion ER (AUVELITY ) 45-105 MG TBCR   traZODone  (DESYREL ) 50 MG tablet   GERD without esophagitis   Referral to GI placed.      Healthcare maintenance - Primary   Annual examination completed, risk stratification labs ordered, anticipatory guidance provided.  We will follow labs once resulted.      Insomnia   Insomnia Chronic insomnia with difficulty maintaining sleep, likely related to depressive symptoms. Potential sleep apnea discussed. - Prescribe trazodone  50 mg at bedtime, option to start at half tablet and increase as needed up to two tablets.  (25-100 mg nightly as needed) - Refer to sleep medicine for evaluation of possible sleep apnea.      Relevant Medications   traZODone  (DESYREL ) 50 MG tablet   Internal jugular (IJ) vein thromboembolism, acute, right (HCC)   On Eliquis       Relevant Medications   sildenafil  (VIAGRA ) 50 MG tablet   Other hemorrhoids   Anorectal symptoms - Hemorrhoid flare-ups ongoing phonically - Prescription hydrocortisone  2.5% cream used without symptom relief - Bowel movements occur 4-8 times daily with loose consistency, attributed to prior colostomy surgery  Hemorrhoids Chronic hemorrhoids with persistent pain and itching despite hydrocortisone  cream, likely due to prolonged vascular swelling. - Prescribe Kenalog  topical steroid. - Refer to GI for evaluation of additional  management options. - Advise to avoid prolonged sitting and straining.      Relevant Medications   sildenafil  (VIAGRA ) 50 MG tablet   triamcinolone  cream (KENALOG ) 0.1 %   Other Relevant Orders   Ambulatory referral to Gastroenterology   Other male erectile dysfunction   Erectile dysfunction Erectile dysfunction post-surgery, not previously treated with medication. Discussed use of sildenafil . - Prescribe sildenafil  as needed, advise to take  30 minutes to an hour before anticipated sexual activity.      Relevant Medications   sildenafil  (VIAGRA ) 50 MG tablet   Other Visit Diagnoses       Screening for cardiovascular condition       Relevant Orders   Comprehensive metabolic panel with GFR   Lipid panel     Vitamin D  deficiency       Relevant Orders   VITAMIN D  25 Hydroxy (Vit-D Deficiency, Fractures)     Abnormal glucose       Relevant Orders   Hemoglobin A1c     Benign prostatic hyperplasia, unspecified whether lower urinary tract symptoms present       Relevant Orders   PSA Total (Reflex To Free)     Anemia, unspecified type       Relevant Orders   Iron, TIBC and Ferritin Panel   B12 and Folate Panel        Orders & Medications Medications:  Meds ordered this encounter  Medications   Dextromethorphan-buPROPion ER (AUVELITY ) 45-105 MG TBCR    Sig: Take 1 tablet by mouth in the morning and at bedtime.    Dispense:  60 tablet    Refill:  3   sildenafil  (VIAGRA ) 50 MG tablet    Sig: Take 1 tablet (50 mg total) by mouth daily as needed for erectile dysfunction.    Dispense:  10 tablet    Refill:  0   triamcinolone  cream (KENALOG ) 0.1 %    Sig: Apply a thin layer to the affected external anal area twice daily. Do not apply internally. Do not use for more than 7 consecutive days.    Dispense:  45 g    Refill:  1   traZODone  (DESYREL ) 50 MG tablet    Sig: Take 0.5-2 tablets (25-100 mg total) by mouth at bedtime as needed.    Dispense:  60 tablet    Refill:  0   Orders Placed This Encounter  Procedures   CBC   Comprehensive metabolic panel with GFR   Hemoglobin A1c   Lipid panel   PSA Total (Reflex To Free)   VITAMIN D  25 Hydroxy (Vit-D Deficiency, Fractures)   TSH   Iron, TIBC and Ferritin Panel   B12 and Folate Panel   Ambulatory referral to Pulmonology   Ambulatory referral to Gastroenterology     Return in about 2 months (around 08/21/2024) for MyChart Video Visit.     Selinda JINNY Ku, MD,  [facility]   Primary Care Sports Medicine Primary Care and Sports Medicine at [facility]

## 2024-06-21 NOTE — Assessment & Plan Note (Signed)
 Erectile dysfunction Erectile dysfunction post-surgery, not previously treated with medication. Discussed use of sildenafil . - Prescribe sildenafil  as needed, advise to take 30 minutes to an hour before anticipated sexual activity.

## 2024-06-21 NOTE — Assessment & Plan Note (Signed)
 Mood disturbance and insomnia - Low energy and anhedonia - Difficulty sleeping with frequent awakenings and trouble returning to sleep - No current use of paroxetine  or olanzapine   Depression Depressive symptoms with low energy and lack of interest. Discussed starting Auvelity , expected improvement within two weeks. Potential side effects reviewed. - Prescribe Auvelity , start with one pill daily for three days, then increase to one pill twice daily. - Provide samples of Auvelity  for immediate start. - Monitor for side effects such as lightheadedness.  Contact our office if the symptoms persist.

## 2024-06-21 NOTE — Assessment & Plan Note (Signed)
 Annual examination completed, risk stratification labs ordered, anticipatory guidance provided.  We will follow labs once resulted.

## 2024-06-21 NOTE — Patient Instructions (Addendum)
-   Obtain fasting labs with orders provided (can have water or black coffee but otherwise no food or drink x 8 hours before labs) - Review information provided - Attend eye doctor annually, dentist every 6 months, work towards or maintain 30 minutes of moderate intensity physical activity at least 5 days per week, and consume a balanced diet - Return in 1 year for physical - Contact us  for any questions between now and then   Patient Plan for Post-Visit Guidance  1. Adenomatous Polyp:    - Follow up with the referral to Gastroenterology (GI) for evaluation.  2. Chronic Fatigue:    - Complete lab tests for B12, iron, thyroid , and vitamin D  levels.    - Follow up with referrals to GI and sleep medicine for further evaluation.  3. Depression and Insomnia:    - Start Auvelity  with one pill daily for three days, then increase to one pill twice daily. Monitor for side effects like lightheadedness and contact the office if they persist.    - Use trazodone  50 mg at bedtime for insomnia, starting with half a tablet and increasing as needed up to two tablets.    - Follow up with sleep medicine for evaluation of possible sleep apnea.  4. GERD without Esophagitis:    - Follow up with the referral to Gastroenterology (GI) for evaluation.  5. Internal Jugular Vein Thromboembolism:    - Continue taking Eliquis  as prescribed.  6. Hemorrhoids:    - Use Kenalog  topical steroid as prescribed.    - Follow up with the referral to Gastroenterology (GI) for additional management options.    - Avoid prolonged sitting and straining.  7. Erectile Dysfunction:    - Take sildenafil  as needed, 30 minutes to an hour before anticipated sexual activity.  Red Flags: - If you experience increased pain, new symptoms, or any concerning changes, contact the office immediately.

## 2024-06-21 NOTE — Assessment & Plan Note (Signed)
 On Eliquis

## 2024-06-21 NOTE — Assessment & Plan Note (Signed)
 Anorectal symptoms - Hemorrhoid flare-ups ongoing phonically - Prescription hydrocortisone  2.5% cream used without symptom relief - Bowel movements occur 4-8 times daily with loose consistency, attributed to prior colostomy surgery  Hemorrhoids Chronic hemorrhoids with persistent pain and itching despite hydrocortisone  cream, likely due to prolonged vascular swelling. - Prescribe Kenalog  topical steroid. - Refer to GI for evaluation of additional management options. - Advise to avoid prolonged sitting and straining.

## 2024-06-21 NOTE — Assessment & Plan Note (Signed)
 Chronic fatigue Mild anemia likely secondary to recent surgery and possible chronic blood loss.  Evaluation for sleep apnea recommended. - Order labs to check B12, iron, thyroid , and vitamin D  levels. - Referral to GI and sleep medicine placed

## 2024-06-21 NOTE — Assessment & Plan Note (Addendum)
 Mood disturbance and insomnia - Low energy and anhedonia - Difficulty sleeping with frequent awakenings and trouble returning to sleep - No current use of paroxetine  or olanzapine   Depression Depressive symptoms with low energy and lack of interest. Discussed starting Auvelity , expected improvement within two weeks. Potential side effects reviewed. - Prescribe Auvelity , start with one pill daily for three days, then increase to one pill twice daily. - Provide samples of Auvelity  for immediate start. - Monitor for side effects such as lightheadedness.  Contact our office if the symptoms persist.

## 2024-06-26 ENCOUNTER — Inpatient Hospital Stay

## 2024-06-26 ENCOUNTER — Inpatient Hospital Stay: Admitting: Genetic Counselor

## 2024-06-26 ENCOUNTER — Other Ambulatory Visit: Payer: Self-pay | Admitting: Genetic Counselor

## 2024-06-26 DIAGNOSIS — Z83719 Family history of colon polyps, unspecified: Secondary | ICD-10-CM

## 2024-06-26 DIAGNOSIS — Z800 Family history of malignant neoplasm of digestive organs: Secondary | ICD-10-CM

## 2024-06-26 DIAGNOSIS — Z1379 Encounter for other screening for genetic and chromosomal anomalies: Secondary | ICD-10-CM

## 2024-06-26 DIAGNOSIS — D1391 Familial adenomatous polyposis: Secondary | ICD-10-CM

## 2024-06-26 DIAGNOSIS — C090 Malignant neoplasm of tonsillar fossa: Secondary | ICD-10-CM | POA: Diagnosis not present

## 2024-06-26 LAB — GENETIC SCREENING ORDER

## 2024-06-27 ENCOUNTER — Encounter: Payer: Self-pay | Admitting: Oncology

## 2024-06-27 ENCOUNTER — Encounter: Payer: Self-pay | Admitting: Genetic Counselor

## 2024-06-27 NOTE — Progress Notes (Signed)
 REFERRING PROVIDER: [name], MD  PRIMARY PROVIDER:  [name], MD  PRIMARY REASON FOR VISIT:  1. Familial adenomatous polyposis   2. Family history of colon cancer   3. Family history of colonic polyps    HISTORY OF PRESENT ILLNESS:   Mr. Vanderschaaf, a 43 y.o. male, was seen for a [facility] cancer genetics consultation at the request of Dr. Autumn due to a personal history of clinical familial adenomatous polyposis and family history of cancer/polyposis. Mr. Golubski presents to clinic today to discuss the possibility of a hereditary predisposition to cancer, to discuss genetic testing, and to further clarify his future cancer risks, as well as potential cancer risks for family members.   Based on prior physician notes, Mr. River has a personal history of familial adenomatous polyposis. Mr. Dallman reports his diagnosis is a clinical diagnosis and he has never had genetic testing. He had a total colectomy in 2016 due to polyposis and stated he was found to have over 1000 polyps at that time. He was diagnosed with tonsillar cancer in 2024.  CANCER HISTORY:  Oncology History  Malignant neoplasm of tonsillar fossa (HCC)  07/20/2023 Imaging   CT neck soft tissue: 2.5 x 3 cm mass in the left tonsillar region with extension towards the tongue base, suspicious for carcinoma, and evidence of level 2 lymphadenopathy on the left, consisting of a dominant node measuring 3 x 2 x 1.7 cm. A benign appearing osteoma was also demonstrated, projecting inferior from the angle of the left mandible.    08/26/2023 Initial Diagnosis   Malignant neoplasm of tonsillar fossa [facility]):  He presented to his PCP for routine follow-up in July 2024 and was incidentally noted to have left submandibular swelling on a routine examination performed at that time. A soft tissue head and neck ultrasound was subsequently performed on 07/07/23 which showed an ovoid mass measuring 2.8 x 1.9 cm, most consistent with an  abnormally enlarged lymph node, and correlating with the palpable area in the left submandibular region.    A soft tissue neck CT was also performed on 07/20/23 which demonstrated: a  2.5 x 3 cm mass in the left tonsillar region with extension towards the tongue base, suspicious for carcinoma, and evidence of level 2 lymphadenopathy on the left, consisting of a dominant node measuring 3 x 2 x 1.7 cm. A benign appearing osteoma was also demonstrated, projecting inferior from the angle of the left mandible.   Subsequently, the patient was referred to Dr. Denys at [facility] and underwent biopsies of the left tonsil on 08/26/23. 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    08/26/2023 Pathology Results   Patient was referred to Dr. Denys at [facility] and underwent biopsies of the left tonsil on 08/26/23. 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    09/07/2023 PET scan   PET scan performed on 09/07/23 at [facility]: intense radiotracer uptake within the left palatine tonsillar mass; adjacent radiotracer uptake within the region of the right palatine tonsil; and hypermetabolic nodes in the left neck extending to the supraclavicular region concerning for nodal metastases. Focal uptake at the anorectal junction was also demonstrated, possibly reflecting muscular contraction, however local malignancy can not be excluded. PET otherwise showed no evidence of more distant metastatic disease.    09/14/2023 Cancer Staging   Staging form: Pharynx - HPV-Mediated Oropharynx, AJCC 8th Edition - Clinical stage from 09/14/2023: Stage I (  cT2, cN1, cM0, p16+) - Signed by [name], MD on 09/14/2023 Stage prefix: Initial diagnosis   09/29/2023 - 10/21/2023 Chemotherapy   Patient is on Treatment Plan : HEAD/NECK Cisplatin  (40) q7d      Past Medical History:  Diagnosis Date   AKI (acute  kidney injury) (HCC) 01/31/2024   Anxiety    Asthma    Avascular necrosis of hip, left (HCC) 07/24/2021   Bright red blood per rectum 12/16/2022   Closed fracture of nasal bone with routine healing 12/24/2016   Depression    DVT (deep venous thrombosis) (HCC)    PTSD (post-traumatic stress disorder)     Past Surgical History:  Procedure Laterality Date   APPENDECTOMY     COLON SURGERY  03/30/2015   IR GASTROSTOMY TUBE MOD SED  09/22/2023   IR GASTROSTOMY TUBE REMOVAL  06/08/2024   IR IMAGING GUIDED PORT INSERTION  09/22/2023   IR RADIOLOGIST EVAL & MGMT  03/12/2024   IR REMOVAL TUN ACCESS W/ PORT W/O FL MOD SED  06/08/2024   IR REPLC GASTRO/COLONIC TUBE PERCUT W/FLUORO  02/08/2024   JOINT REPLACEMENT  2008   partial right elbow; right femoral graft   LAPAROTOMY N/A 02/01/2024   Procedure: EXPLORATORY LAPAROTOMY, LYSIS OF ADHESIONS, SMALL BOWEL RESECTION;  Surgeon: [name] Lonni HERO, MD;  Location: [facility];  Service: General;  Laterality: N/A;  LYSIS OF ADHESIONS, POSSIBLE SMALL BOWEL RESECTION   Total abdominal proctocolectomy, J-pouch, ileal pouch anal anastomosis  2016    Social History   Socioeconomic History   Marital status: Married    Spouse name: Teacher, English as a foreign language   Number of children: 3   Years of education: 12+   Highest education level: Associate degree: occupational, Scientist, product/process development, or vocational program  Occupational History   Not on file  Tobacco Use   Smoking status: Never   Smokeless tobacco: Never  Vaping Use   Vaping status: Never Used  Substance and Sexual Activity   Alcohol use: Yes    Comment: Occasional   Drug use: Never   Sexual activity: Yes    Partners: Female  Other Topics Concern   Not on file  Social History Narrative   ** Merged History Encounter **       Social Drivers of Health   Financial Resource Strain: Low Risk  (06/19/2024)   Overall Financial Resource Strain (CARDIA)    Difficulty of Paying Living Expenses: Not hard at all  Food  Insecurity: No Food Insecurity (06/19/2024)   Hunger Vital Sign    Worried About Running Out of Food in the Last Year: Never true    Ran Out of Food in the Last Year: Never true  Transportation Needs: No Transportation Needs (06/19/2024)   PRAPARE - Transportation    Lack of Transportation (Medical): No    Lack of Transportation (Non-Medical): No  Physical Activity: Inactive (06/19/2024)   Exercise Vital Sign    Days of Exercise per Week: 0 days    Minutes of Exercise per Session: Not on file  Stress: No Stress Concern Present (06/19/2024)   [employer] of Occupational Health - Occupational Stress Questionnaire    Feeling of Stress: Only a little  Social Connections: Moderately Isolated (06/19/2024)   Social Connection and Isolation Panel    Frequency of Communication with Friends and Family: More than three times a week    Frequency of Social Gatherings with Friends and Family: Once a week    Attends Religious Services: Never    Active Member  of Clubs or Organizations: No    Attends Engineer, structural: Not on file    Marital Status: Married     FAMILY HISTORY:  We obtained a detailed, 4-generation family history.  Significant diagnoses are listed below: Family History  Problem Relation Age of Onset   Arthritis Mother    Anemia Mother    Diabetes Father    Colon polyps Father 9       colectomy due to polyposis   Hypertension Brother    Colon cancer Half-Brother 60 - 74       colectomy due to polyposis and colon cancer   Heart disease Paternal Grandmother    Cancer Paternal Grandfather        intestinal cancer   Asthma Daughter    Colon polyps Half-Brother 5       colon polyps throughout his small and large intestine and stomach   Colon polyps Half-Sister        colectomy due to polyposis     Mr. Bann is unaware of previous family history of genetic testing for hereditary cancer risks. There is no reported Ashkenazi Jewish ancestry.   GENETIC  COUNSELING ASSESSMENT: Mr. Silversmith is a 43 y.o. male with a personal history of polyposis and family history of cancer/polyposis. We, therefore, discussed and recommended the following at today's visit.   DISCUSSION: We discussed that 5 - 10% of cancer/polyposis is hereditary, with most cases of adenomatous polyposis associated with the APC gene. From our discussion, it sounds like he has a clinical diagnosis of FAP but has never had genetic testing. Of note, there are other genes that can be associated with hereditary polyposis/cancer syndromes.  We discussed that testing is beneficial for several reasons including knowing how to follow individuals for cancer screenings and understanding if other family members could be at risk for polyposis/cancer and allowing them to undergo genetic testing.   We reviewed the characteristics, features and inheritance patterns of hereditary cancer syndromes. We also discussed genetic testing, including the appropriate family members to test, the process of testing, insurance coverage and turn-around-time for results. We discussed the implications of a negative, positive, carrier and/or variant of uncertain significant result. We recommended Mr. Graveline pursue genetic testing for a panel that includes genes associated with polyposis/cancer.   Mr. Bernier elected to have [facility] CancerNext Panel. The [facility] CancerNext+RNAinsight Panel includes sequencing, rearrangement analysis, and RNA analysis for the following 40 genes: APC, ATM, BAP1, BARD1, BMPR1A, BRCA1, BRCA2, BRIP1, CDH1, CDKN2A, CHEK2, FH, FLCN, MET, MLH1, MSH2, MSH6, MUTYH, NF1, NTHL1, PALB2, PMS2, PTEN, RAD51C, RAD51D, RPS20, SMAD4, STK11, TP53, TSC1, TSC2, and VHL (sequencing and deletion/duplication); AXIN2, HOXB13, MBD4, MSH3, POLD1 and POLE (sequencing only); EPCAM and GREM1 (deletion/duplication only).  Based on Mr. Rajewski personal and family history of polyposis, he meets medical criteria for genetic testing.  Despite that he meets criteria, he may still have an out of pocket cost. We discussed that if his out of pocket cost for testing is over $100, the laboratory should contact them to discuss self-pay prices, patient pay assistance programs, if applicable, and other billing options.  PLAN: After considering the risks, benefits, and limitations, Mr. Lindblad provided informed consent to pursue genetic testing and the blood sample was sent to [facility] for analysis of the CancerNext Panel. Results should be available within approximately 2-3 weeks' time, at which point they will be disclosed by telephone to Mr. Nishikawa, as will any additional recommendations warranted by these results. Mr. Monette  will receive a summary of his genetic counseling visit and a copy of his results once available. This information will also be available in Epic.   Mr. Bassford questions were answered to his satisfaction today. Our contact information was provided should additional questions or concerns arise. Thank you for the referral and allowing us  to share in the care of your patient.   Ismelda Weatherman, MS, [facility] Genetic Counselor [email or web address] (P) [phone]  40 minutes were spent on the date of the encounter in service to the patient including preparation, face-to-face consultation, documentation and care coordination. The patient was seen alone.  Drs. Gudena and/or Lanny were available to discuss this case as needed.   _______________________________________________________________________ For Office Staff:  Number of people involved in session: 1 Was an Intern/ student involved with case: no

## 2024-06-29 ENCOUNTER — Inpatient Hospital Stay

## 2024-07-06 ENCOUNTER — Encounter: Payer: Self-pay | Admitting: Oncology

## 2024-07-10 ENCOUNTER — Encounter: Payer: Self-pay | Admitting: Physician Assistant

## 2024-07-11 ENCOUNTER — Encounter: Payer: Self-pay | Admitting: Oncology

## 2024-07-12 ENCOUNTER — Ambulatory Visit

## 2024-07-13 ENCOUNTER — Telehealth: Payer: Self-pay | Admitting: Genetic Counselor

## 2024-07-13 ENCOUNTER — Encounter: Payer: Self-pay | Admitting: Genetic Counselor

## 2024-07-13 DIAGNOSIS — Z1379 Encounter for other screening for genetic and chromosomal anomalies: Secondary | ICD-10-CM | POA: Insufficient documentation

## 2024-07-13 DIAGNOSIS — Z1509 Genetic susceptibility to other malignant neoplasm: Secondary | ICD-10-CM | POA: Insufficient documentation

## 2024-07-13 NOTE — Telephone Encounter (Signed)
 I contacted Mr. Hinderman to discuss his genetic testing results. 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 Report date is 07/06/2024. Detailed clinic note to follow.  The test report has been scanned into EPIC and is located under the Molecular Pathology section of the Results Review tab.  A portion of the result report is included below for reference.   Otha Monical, MS, [facility] Genetic Counselor [email or web address] (P) [phone]

## 2024-07-17 ENCOUNTER — Ambulatory Visit: Payer: Self-pay | Admitting: Genetic Counselor

## 2024-07-17 DIAGNOSIS — Z1509 Genetic susceptibility to other malignant neoplasm: Secondary | ICD-10-CM

## 2024-07-17 NOTE — Progress Notes (Signed)
 HPI:   Harry Williams was previously seen in the [facility] due to a personal and family history of clinically diagnosed familial adenomatous polyposis. Please refer to our prior cancer genetics clinic note for more information regarding our discussion, assessment and recommendations, at the time. Harry Williams recent genetic test results were disclosed to him, as were recommendations warranted by these results. These results and recommendations are discussed in more detail below.  CANCER HISTORY:  Oncology History  Malignant neoplasm of tonsillar fossa (HCC)  07/20/2023 Imaging   CT neck soft tissue: 2.5 x 3 cm mass in the left tonsillar region with extension towards the tongue base, suspicious for carcinoma, and evidence of level 2 lymphadenopathy on the left, consisting of a dominant node measuring 3 x 2 x 1.7 cm. A benign appearing osteoma was also demonstrated, projecting inferior from the angle of the left mandible.    08/26/2023 Initial Diagnosis   Malignant neoplasm of tonsillar fossa [facility]):  He presented to his PCP for routine follow-up in July 2024 and was incidentally noted to have left submandibular swelling on a routine examination performed at that time. A soft tissue head and neck ultrasound was subsequently performed on 07/07/23 which showed an ovoid mass measuring 2.8 x 1.9 cm, most consistent with an abnormally enlarged lymph node, and correlating with the palpable area in the left submandibular region.    A soft tissue neck CT was also performed on 07/20/23 which demonstrated: a  2.5 x 3 cm mass in the left tonsillar region with extension towards the tongue base, suspicious for carcinoma, and evidence of level 2 lymphadenopathy on the left, consisting of a dominant node measuring 3 x 2 x 1.7 cm. A benign appearing osteoma was also demonstrated, projecting inferior from the angle of the left mandible.   Subsequently, the patient was referred to Dr. Denys at [facility]  ENT and underwent biopsies of the left tonsil on 08/26/23. 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    08/26/2023 Pathology Results   Patient was referred to Dr. Denys at [facility] ENT and underwent biopsies of the left tonsil on 08/26/23. 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    09/07/2023 PET scan   PET scan performed on 09/07/23 at [facility]: intense radiotracer uptake within the left palatine tonsillar mass; adjacent radiotracer uptake within the region of the right palatine tonsil; and hypermetabolic nodes in the left neck extending to the supraclavicular region concerning for nodal metastases. Focal uptake at the anorectal junction was also demonstrated, possibly reflecting muscular contraction, however local malignancy can not be excluded. PET otherwise showed no evidence of more distant metastatic disease.    09/14/2023 Cancer Staging   Staging form: Pharynx - HPV-Mediated Oropharynx, AJCC 8th Edition - Clinical stage from 09/14/2023: Stage I (cT2, cN1, cM0, p16+) - Signed by [name], MD on 09/14/2023 Stage prefix: Initial diagnosis   09/29/2023 - 10/21/2023 Chemotherapy   Patient is on Treatment Plan : HEAD/NECK Cisplatin  (41) q7d       FAMILY HISTORY:  We obtained a detailed, 4-generation family history.  Significant diagnoses are listed below:      Family History  Problem Relation Age of Onset   Arthritis Mother     Anemia Mother     Diabetes Father     Colon polyps Father 42        colectomy due to polyposis   Hypertension Brother  Colon cancer Half-Brother 62 - 37        colectomy due to polyposis and colon cancer   Heart disease Paternal Grandmother     Cancer Paternal Grandfather          intestinal cancer   Asthma Daughter     Colon polyps Half-Brother 59        colon polyps throughout his small and large intestine and stomach    Colon polyps Half-Sister          colectomy due to polyposis  [] Expand by Default         Harry Williams is unaware of previous family history of genetic testing for hereditary cancer risks. There is no reported Ashkenazi Jewish ancestry.    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 Of note, the remaining 39 genes analyzed were Negative.    The test report has been scanned into EPIC and is located under the Molecular Pathology section of the Results Review tab.  A portion of the result report is included below for reference. Genetic testing reported out on 07/06/2024.        APC CLINICAL INFORMATION  Familial adenomatous polyposis (FAP) is a genetic condition that is characterized by the presence of greater than 100 adenomatous colon polyps. On average, individuals with FAP develop polyps at 42 years of age, with 95% of individuals developing polyps by age 67.  A milder form of FAP has been described called attenuated AFP (AFAP). AFAP has a later age of onset than classic FAP (approximately 42 years of age), presents with fewer adenomatous polyps (<100) that are primarily proximal (right-sided),   The cancers associated with FAP are:  70-100% risk of colon cancer Risk for AFAP is closer to 70% 1-10% risk of cancer of duodenal or periampullary cancer 0.1-7.1% risk of gastric cancer <1% risk of small bowel cancer 10-24% risk of intra-abdominal desmoid tumors 1.2-12% risk for thyroid  cancer (usually papillary) 0.4-2.5% risk of hepatoblastoma 1% risk of central nervous system cancer (predominately medulloblastoma)   Extra-colonic findings can include extra or missing teeth, osteomas, epidermoid cysts, fibromas, and congenital hyperplasia of the retinal pigment epithelium (CHRPE), which is a specific finding that requires a specialized eye examination to identify.    APC Management Medical management and surveillance protocols have been  developed by the [employer] (NCCN) for individuals with FAP and AFAP (National Comprehensive Cancer Network, Genetic/Familial High Risk Assessment: Colorectal version 2.2023).   FAP  Colon: Annual sigmoidoscopy/colonoscopy beginning at 22-52 years of age. A colectomy or proctocolectomy is recommended after numerous polyps are detected, due to their high malignant potential.  If patient had colectomy with ileorectal anastomosis (IRA), then endoscopic evaluation of the rectum every 6-12 mo depending on polyp burden. If patient had TPC with ileal pouch-anal anastomosis (IPAA), then endoscopic evaluations of the ileal pouch and rectal cuff annually depending on polyp burden. Surveillance frequency should be shortened to every 6 mo for large, flat polyps with villous histology and/or high-grade dysplasia identified. If patient had an ileostomy, consider careful visualization and stoma inspection by ileoscopy to evaluate for polyps or malignancy annually; evidence to support this recommendation is limited. Chemoprevention (e.g., sulindac) can aid in management of the remaining rectum; however, there are no medications currently approved by the FDA for this indication.  There are data to suggest that sulindac showed the most significant polyp regression, but it is unclear if the decrease in polyp burden equates  to reduction in colorectal cancer risk. Extracolonic: Upper endoscopy beginning at age 87-25 years. Consider upper endoscopy at an earlier age if colectomy is performed prior to age 31 years. Consider baseline upper endoscopy earlier, if family history of aggressive duodenal adenoma burden or cancer. Frequency of upper endoscopy surveillance is dependent on polyp burden. It is important to note that fundic gland polyps are common in individuals with FAP and while focal low-grade dysplasia can be identified, it is typically non-progressive. High-risk histologic features include  tubular adenomas, polyps with high-grade dysplasia, and pyloric gland adenomas. Need for specialized surveillance or surgery should be considered in presence of high-risk histologic features, preferably at a center of expertise. Presence of fundic gland polyps with low-grade dysplasia alone in the absence of high-risk features does not require specialized surveillance. Non-fundic gland polyps should be managed endoscopically if possible. Patients with high-risk lesions that cannot be removed endoscopically should be referred to a specialized center for consideration of gastrectomy. Annual thyroid  examination beginning in the late teenage years. If normal, consider repeating ultrasound every 2-5 y and if abnormal, consider referral to a thyroid  specialist. Shorter intervals may be considered for individuals with a family history of thyroid  cancer. Patients should be educated regarding signs and symptoms of neurologic cancer and the importance of prompt reporting of abnormal symptoms to their physicians. Suggestive abdominal symptoms should prompt immediate abdominal imaging. If personal history of symptomatic desmoids, consider imaging with abdominal MRI with and without contrast or CT with contrast no less frequently than annually.  For small bowel polyps and cancer,  may consider small bowel visualization (eg, capsule endoscopy), especially if advanced duodenal polyposis. Consider liver palpation, abdominal ultrasound, and measurement of AFP every 3-6 months during the first 5 years of life.   This information is based on current understanding of the gene and may change in the future.   An individual's cancer risk and medical management are not determined by genetic test results alone. Overall cancer risk assessment incorporates additional factors, including personal medical history, family history, and any available genetic information that may result in a personalized plan for cancer prevention and  surveillance.   Implications for Family Members: Hereditary predisposition to cancer due to pathogenic variants in the APC genes have autosomal dominant inheritance. This means that an individual with a pathogenic variant has a 50% chance of passing the condition on to his/her offspring. Identification of a pathogenic variant allows for the recognition of at-risk relatives who can pursue testing for the familial variant.   Family members are encouraged to consider genetic testing for these familial pathogenic variants. Individuals with APC mutations may begin cancer screening before age 63 and thus may be considered for genetic testing prior to adulthood.  For family members over 35, they may contact our office at [phone] for more information or to schedule an appointment.  Complimentary testing for the familial variant is available for 90 days from his report date.  Family members who live outside of the area are encouraged to find a genetic counselor in their area by visiting: [email or web address].   Resources: FORCE (Facing Our Risk of Cancer Empowered) is a resource for those with a hereditary predisposition to develop cancer.  FORCE provides information about risk reduction, advocacy, legislation, and clinical trials.  Additionally, FORCE provides a platform for collaboration and support; which includes: peer navigation, message boards, local support groups, a toll-free helpline, research registry and recruitment, advocate training, published medical research, webinars, brochures, mastectomy photos, and more.  For  more information, visit www.facingourrisk.org   FAP Voice: www.fapvoice.com   Our contact number was provided. Harry Williams questions were answered to his satisfaction, and he knows he is welcome to call us  at anytime with additional questions or concerns.   Harry Rodino, MS, [facility] Genetic Counselor [email or web address] (P) [phone]

## 2024-07-23 ENCOUNTER — Encounter: Payer: Self-pay | Admitting: Genetic Counselor

## 2024-07-24 LAB — LIPID PANEL
Chol/HDL Ratio: 3.3 ratio (ref 0.0–5.0)
Cholesterol, Total: 214 mg/dL — ABNORMAL HIGH (ref 100–199)
HDL: 64 mg/dL (ref >39)
LDL Chol Calc (NIH): 131 mg/dL — ABNORMAL HIGH (ref 0–99)
Triglycerides: 106 mg/dL (ref 0–149)
VLDL Cholesterol Cal: 19 mg/dL (ref 5–40)

## 2024-07-24 LAB — COMPREHENSIVE METABOLIC PANEL WITH GFR
ALT: 13 IU/L (ref 0–44)
AST: 19 IU/L (ref 0–40)
Albumin: 4.7 g/dL (ref 4.1–5.1)
Alkaline Phosphatase: 73 IU/L (ref 44–121)
BUN/Creatinine Ratio: 9 (ref 9–20)
BUN: 11 mg/dL (ref 6–24)
Bilirubin Total: 0.7 mg/dL (ref 0.0–1.2)
CO2: 25 mmol/L (ref 20–29)
Calcium: 10.1 mg/dL (ref 8.7–10.2)
Chloride: 102 mmol/L (ref 96–106)
Creatinine, Ser: 1.18 mg/dL (ref 0.76–1.27)
Globulin, Total: 2.5 g/dL (ref 1.5–4.5)
Glucose: 119 mg/dL — ABNORMAL HIGH (ref 70–99)
Potassium: 4.1 mmol/L (ref 3.5–5.2)
Sodium: 142 mmol/L (ref 134–144)
Total Protein: 7.2 g/dL (ref 6.0–8.5)
eGFR: 80 mL/min/1.73 (ref >59)

## 2024-07-24 LAB — CBC
Hematocrit: 42.9 % (ref 37.5–51.0)
Hemoglobin: 14.2 g/dL (ref 13.0–17.7)
MCH: 30.5 pg (ref 26.6–33.0)
MCHC: 33.1 g/dL (ref 31.5–35.7)
MCV: 92 fL (ref 79–97)
Platelets: 211 x10E3/uL (ref 150–450)
RBC: 4.65 x10E6/uL (ref 4.14–5.80)
RDW: 13.3 % (ref 11.6–15.4)
WBC: 2.3 x10E3/uL — CL (ref 3.4–10.8)

## 2024-07-24 LAB — PSA TOTAL (REFLEX TO FREE): Prostate Specific Ag, Serum: 0.8 ng/mL (ref 0.0–4.0)

## 2024-07-24 LAB — VITAMIN D 25 HYDROXY (VIT D DEFICIENCY, FRACTURES): Vit D, 25-Hydroxy: 23.2 ng/mL — ABNORMAL LOW (ref 30.0–100.0)

## 2024-07-24 LAB — B12 AND FOLATE PANEL
Folate: 11.1 ng/mL (ref >3.0)
Vitamin B-12: 533 pg/mL (ref 232–1245)

## 2024-07-24 LAB — IRON,TIBC AND FERRITIN PANEL
Ferritin: 52 ng/mL (ref 30–400)
Iron Saturation: 27 % (ref 15–55)
Iron: 81 ug/dL (ref 38–169)
Total Iron Binding Capacity: 297 ug/dL (ref 250–450)
UIBC: 216 ug/dL (ref 111–343)

## 2024-07-24 LAB — TSH: TSH: 1.81 u[IU]/mL (ref 0.450–4.500)

## 2024-07-24 LAB — HEMOGLOBIN A1C
Est. average glucose Bld gHb Est-mCnc: 100 mg/dL
Hgb A1c MFr Bld: 5.1 % (ref 4.8–5.6)

## 2024-08-03 ENCOUNTER — Ambulatory Visit: Payer: Self-pay | Admitting: Family Medicine

## 2024-08-03 MED ORDER — VITAMIN D (ERGOCALCIFEROL) 1.25 MG (50000 UNIT) PO CAPS: 50000.0000 [IU] | ORAL_CAPSULE | ORAL | 0 refills | Status: AC

## 2024-08-06 ENCOUNTER — Other Ambulatory Visit: Payer: Self-pay | Admitting: Oncology

## 2024-08-06 ENCOUNTER — Other Ambulatory Visit (HOSPITAL_COMMUNITY): Payer: Self-pay

## 2024-08-06 DIAGNOSIS — T451X5A Adverse effect of antineoplastic and immunosuppressive drugs, initial encounter: Secondary | ICD-10-CM

## 2024-08-06 NOTE — Progress Notes (Signed)
 Thank you for your close attention to this mutual patient.  Kind regards, Selinda Ku

## 2024-08-08 ENCOUNTER — Telehealth: Payer: Self-pay | Admitting: Pain Medicine

## 2024-08-08 NOTE — Telephone Encounter (Signed)
 Scheduled appointments per staff message. Talked with the patient and he is aware of the made appointments.

## 2024-08-15 ENCOUNTER — Ambulatory Visit: Payer: Self-pay

## 2024-08-15 VITALS — BP 102/82 | HR 74 | Ht 68.0 in | Wt 151.8 lb

## 2024-08-15 DIAGNOSIS — F418 Other specified anxiety disorders: Secondary | ICD-10-CM

## 2024-08-15 DIAGNOSIS — G4709 Other insomnia: Secondary | ICD-10-CM

## 2024-08-15 DIAGNOSIS — R0683 Snoring: Secondary | ICD-10-CM

## 2024-08-15 DIAGNOSIS — G4721 Circadian rhythm sleep disorder, delayed sleep phase type: Secondary | ICD-10-CM

## 2024-08-15 DIAGNOSIS — R5383 Other fatigue: Secondary | ICD-10-CM

## 2024-08-15 DIAGNOSIS — G478 Other sleep disorders: Secondary | ICD-10-CM
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
## 2024-08-15 NOTE — Assessment & Plan Note (Signed)
 Likely combination of DSPS+ psych related.  In addition patient has low sleep duration at baseline based on history. Will evaluate need for sleeping aids after sleep study.

## 2024-08-15 NOTE — Patient Instructions (Addendum)
 Notification of test results are managed in the following manner: If there are any recommendations or changes to the plan of care discussed in office today, we will contact you and let you know what they are. If you do not hear from us , then your results are normal/expected and you can view them through your MyChart account, or a letter will be sent to you. Thank you again for trusting us  with your care [facility].  Sleep study has been ordered.  This will be done in the lab.  Someone will call you to schedule this appointment.

## 2024-08-20 ENCOUNTER — Ambulatory Visit: Payer: Self-pay

## 2024-08-20 LAB — TESTOSTERONE,FREE AND TOTAL
Testosterone, Free: 15.4 pg/mL (ref 6.8–21.5)
Testosterone: 694 ng/dL (ref 264–916)

## 2024-08-21 ENCOUNTER — Encounter: Payer: Self-pay | Admitting: Family Medicine

## 2024-08-21 ENCOUNTER — Telehealth: Admitting: Family Medicine

## 2024-08-24 ENCOUNTER — Encounter: Payer: Self-pay | Admitting: Oncology

## 2024-08-24 ENCOUNTER — Inpatient Hospital Stay (HOSPITAL_BASED_OUTPATIENT_CLINIC_OR_DEPARTMENT_OTHER): Admitting: Oncology

## 2024-08-24 ENCOUNTER — Inpatient Hospital Stay: Attending: Oncology

## 2024-08-24 VITALS — BP 115/68 | HR 70 | Temp 97.7°F | Resp 16 | Ht 68.0 in | Wt 156.7 lb

## 2024-08-24 DIAGNOSIS — I82C11 Acute embolism and thrombosis of right internal jugular vein: Secondary | ICD-10-CM

## 2024-08-24 DIAGNOSIS — C090 Malignant neoplasm of tonsillar fossa: Secondary | ICD-10-CM | POA: Diagnosis present

## 2024-08-24 DIAGNOSIS — D701 Agranulocytosis secondary to cancer chemotherapy: Secondary | ICD-10-CM

## 2024-08-24 DIAGNOSIS — T451X5A Adverse effect of antineoplastic and immunosuppressive drugs, initial encounter: Secondary | ICD-10-CM

## 2024-08-24 LAB — CMP (CANCER CENTER ONLY)
ALT: 14 U/L (ref 0–44)
AST: 18 U/L (ref 15–41)
Albumin: 4.3 g/dL (ref 3.5–5.0)
Alkaline Phosphatase: 60 U/L (ref 38–126)
Anion gap: 5 (ref 5–15)
BUN: 11 mg/dL (ref 6–20)
CO2: 28 mmol/L (ref 22–32)
Calcium: 9.5 mg/dL (ref 8.9–10.3)
Chloride: 106 mmol/L (ref 98–111)
Creatinine: 1.14 mg/dL (ref 0.61–1.24)
GFR, Estimated: >60 mL/min (ref >60)
Glucose, Bld: 116 mg/dL — ABNORMAL HIGH (ref 70–99)
Potassium: 4.2 mmol/L (ref 3.5–5.1)
Sodium: 139 mmol/L (ref 135–145)
Total Bilirubin: 0.6 mg/dL (ref 0.0–1.2)
Total Protein: 6.9 g/dL (ref 6.5–8.1)

## 2024-08-24 LAB — CBC WITH DIFFERENTIAL (CANCER CENTER ONLY)
Abs Immature Granulocytes: 0 K/uL (ref 0.00–0.07)
Basophils Absolute: 0 K/uL (ref 0.0–0.1)
Basophils Relative: 1 %
Eosinophils Absolute: 0.1 K/uL (ref 0.0–0.5)
Eosinophils Relative: 2 %
HCT: 38.1 % — ABNORMAL LOW (ref 39.0–52.0)
Hemoglobin: 13.7 g/dL (ref 13.0–17.0)
Immature Granulocytes: 0 %
Lymphocytes Relative: 21 %
Lymphs Abs: 0.6 K/uL — ABNORMAL LOW (ref 0.7–4.0)
MCH: 30.9 pg (ref 26.0–34.0)
MCHC: 36 g/dL (ref 30.0–36.0)
MCV: 86 fL (ref 80.0–100.0)
Monocytes Absolute: 0.4 K/uL (ref 0.1–1.0)
Monocytes Relative: 14 %
Neutro Abs: 1.7 K/uL (ref 1.7–7.7)
Neutrophils Relative %: 62 %
Platelet Count: 202 K/uL (ref 150–400)
RBC: 4.43 MIL/uL (ref 4.22–5.81)
RDW: 12.6 % (ref 11.5–15.5)
WBC Count: 2.7 K/uL — ABNORMAL LOW (ref 4.0–10.5)
nRBC: 0 % (ref 0.0–0.2)

## 2024-08-24 LAB — LACTATE DEHYDROGENASE: LDH: 107 U/L (ref 98–192)

## 2024-08-24 NOTE — Assessment & Plan Note (Signed)
 Leukopenia persists post-chemotherapy with a current white blood cell count of 2,700, improved from 2,300 last month. Red blood cell count and platelets are normal. Previous tests for B12, thyroid , folic acid, and iron were normal. Current tests to evaluate bone marrow function and rule out leukemia or autoimmune issues are pending. No signs of infection, fever, or night sweats. Possible residual effect from chemotherapy or bone marrow in a resting phase.  - Today we obtained ANA, rheumatoid factor, methylmalonic acid and flow cytometry of peripheral blood.  Results pending.  - Monitor blood counts in follow-up visit

## 2024-08-24 NOTE — Assessment & Plan Note (Signed)
 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  Previously was on Lovenox  and later switched to Eliquis .  Patient stopped the medication for a few weeks because of affordability issues.  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  D-dimer was undetectable on last visit.  Patient stopped Eliquis  again over the last couple of months.  Given no evidence of disease on recent laryngoscopy, we submitted request for Port-A-Cath removal.  Hopefully this will also help in clot resolution.  He will get restaging CT of the neck in late October followed by clinic visit.  Will follow-up on clot status at this time.

## 2024-08-24 NOTE — Progress Notes (Signed)
 [facility]  ONCOLOGY CLINIC PROGRESS NOTE   Patient Care Team: [name], MD as PCP - General (Family Medicine) [name], MD as Referring Physician (Otolaryngology) [name], MD as Attending Physician (Radiation Oncology) [name], [name], RN as Oncology Nurse Navigator [name], MD as Consulting Physician (Oncology)  Date of visit: 08/24/2024   ASSESSMENT & PLAN:   Very pleasant 43 y.o. 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  Treated with concurrent chemoradiation with weekly cisplatin  starting from 09/29/2023.  Received 4 doses of weekly cisplatin .  Rest of the chemo was held because of neutropenia and also per patient preference.  Leukopenia due to antineoplastic chemotherapy Leukopenia persists post-chemotherapy with a current white blood cell count of 2,700, improved from 2,300 last month. Red blood cell count and platelets are normal. Previous tests for B12, thyroid , folic acid, and iron were normal. Current tests to evaluate bone marrow function and rule out leukemia or autoimmune issues are pending. No signs of infection, fever, or night sweats. Possible residual effect from chemotherapy or bone marrow in a resting phase.  - Today we obtained ANA, rheumatoid factor, methylmalonic acid and flow cytometry of peripheral blood.  Results pending.  - Monitor blood counts in follow-up visit  Malignant neoplasm of tonsillar fossa (HCC) - Hx of left tonsillar squamous cell carcinoma with a 3 cm lymph node. cT2,cN1,cM0, p16+.   - Previously discussed the benefits and risks of radiation alone vs combination of chemotherapy and radiation. Given the patient's age and the size of the lymph node which is borderline at 3 cm,  the benefits of adding chemotherapy slightly outweigh the risks. After discussing side effect profile, patient did opt for weekly cisplatin .  Plan made to proceed with weekly cisplatin  at 40 mg/m dose during the course of radiation. He started radiation treatments from 09/27/2023.  Started first dose of cisplatin  on 09/29/2023.  -On the same day that he received first dose of cisplatin , he developed visual changes, described as a 3D effect and double vision. Although very rare, optic neuritis has been reported with cisplatin  use on literature review, especially when used in combination with other chemotherapeutic agents.  Though it is rare to happen with just 1 dose of cisplatin , given his symptoms, an ophthalmology referral was placed and we held Cisplatin  on 10/05/23. [name] with ophthalmology evaluated patient on 10/06/2023.  No signs of optic neuritis. Patient was cleared to resume cisplatin .  - We resumed cisplatin  from 10/14/2023 and continued this weekly during the course of radiation.  - He received total of 4 doses of cisplatin . He was hospitalized on 11/01/2023 for neutropenic fever, pain from mucositis.  ANC slowly improved.  Infectious workup was negative.  He was discharged home on 11/05/2023.  -Given issues with neutropenia and the fact that his mucositis was getting worse with chemotherapy, patient opted to not receive further chemotherapy. He completed radiation treatments on 11/16/2023.    -He is slowly recovering from treatment-related side effects.  Swallowing function has been improving.  No longer has mouth sores.  Restaging PET scan on 02/20/2024 showed no evidence of residual carcinoma in the oropharynx or hypopharynx.  No hypermetabolic cervical lymph nodes.  Incidentally noted was new rounded intense radiotracer activity in the lateral right abdomen adjacent to bowel and liver,  favoring abscess formation related to recent bowel surgery.  Patient had CT abdomen and pelvis for  further evaluation scheduled on 02/27/2024 followed by appointment with Dr. [name], surgeon.  Eventually this was managed by IR and no issues currently.  He had recent ENT evaluation with Dr. [name] at [facility] on 05/23/2024.  Flexible laryngoscopy showed no evidence of disease.  Will continue surveillance as per NCCN guidelines for his tonsillar cancer.  RTC in November as scheduled with CT of the neck prior to visit.  Internal jugular (IJ) vein thromboembolism, acute, right (HCC) Blood clot near the port site discovered in late December 2024.  Previously was on Lovenox  and later switched to Eliquis .  Patient stopped the medication for a few weeks because of affordability issues.  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  D-dimer was undetectable on last visit.  Patient stopped Eliquis  again over the last couple of months.  Given no evidence of disease on recent laryngoscopy, we submitted request for Port-A-Cath removal.  Hopefully this will also help in clot resolution.  He will get restaging CT of the neck in late October followed by clinic visit.  Will follow-up on clot status at this time.  Insomnia and fatigue Chronic insomnia with recent exacerbation, leading to significant fatigue. Difficulty falling and staying asleep. Attempts to manage with prescribed medications and lifestyle changes, such as reducing phone use before bed. No regular alcohol use. No associated fevers, chills, or infections.   I reviewed lab results and outside records for this visit and discussed relevant results with the patient. Diagnosis, plan of care and treatment options were also discussed in detail with the patient. Opportunity provided to ask questions and answers provided to his apparent satisfaction. Provided instructions to call our clinic with any problems, questions or concerns  prior to return visit. I recommended to continue follow-up with PCP and sub-specialists. He verbalized understanding and agreed with the plan.   NCCN guidelines have been consulted in the planning of this patient's care.  I provided 30 minutes of face-to-face time during this encounter and > 50% was spent counseling as documented under my assessment and plan.    Chinita Patten, MD  08/24/2024 5:49 PM  [facility] AT [facility] [address] [place] [address] Dept: [phone] Dept Fax: [phone]    CHIEF COMPLAINT/ REASON FOR VISIT:   Recently diagnosed squamous of carcinoma of left tonsillar fossa, p16 positive.  Current Treatment: Received chemoradiation with weekly cisplatin , chemo started from 09/29/2023.  Chemotherapy complicated by neutropenia, neutropenic fever.  He received only 4 of the planned 7 cycles of cisplatin .  INTERVAL HISTORY:   Discussed the use of AI scribe software for clinical note transcription with the patient, who gave verbal consent to proceed.  History of Present Illness Harry Williams is a 43 year old male who presented for follow-up after he was found to have leukopenia at his PCPs office  He experiences ongoing sleep disturbances, including difficulty falling and staying asleep, which have worsened recently. He feels very tired, to the point of not wanting to engage in activities, describing it as 'very exhausting' when attempting to do something. He has tried to manage his sleep by limiting phone use at night and has been prescribed medications, though he has not tried melatonin.  He has a history of chemotherapy, having completed four out  of seven planned cycles. During chemotherapy, his white blood cell count dropped but has since partially recovered. His current white blood cell count is 2,700, an improvement from 2,300 last month. His hemoglobin and platelet counts have normalized. Previous tests for B12, thyroid ,  folic acid, and iron levels were normal. No fevers, chills, night sweats, or infections.  He has discontinued the use of Eliquis  due to cost issues and has not taken it for some time. He also had a port-a-cath removed, which has resulted in improved shoulder flexion, though he experiences some tingling in the shoulder area. He is able to lift his arm above his shoulder.   I have reviewed the past medical history, past surgical history, social history and family history with the patient and they are unchanged from previous note.  ALLERGIES: He has no known allergies.  MEDICATIONS:  Current Outpatient Medications  Medication Sig Dispense Refill   celecoxib  (CELEBREX ) 200 MG capsule Place 1 capsule (200 mg total) into feeding tube daily as needed for moderate pain (pain score 4-6). 90 capsule 0   Dextromethorphan-buPROPion ER (AUVELITY ) 45-105 MG TBCR Take 1 tablet by mouth in the morning and at bedtime. 60 tablet 3   sildenafil  (VIAGRA ) 50 MG tablet Take 1 tablet (50 mg total) by mouth daily as needed for erectile dysfunction. 10 tablet 0   traZODone  (DESYREL ) 50 MG tablet Take 0.5-2 tablets (25-100 mg total) by mouth at bedtime as needed. 60 tablet 0   triamcinolone  cream (KENALOG ) 0.1 % Apply a thin layer to the affected external anal area twice daily. Do not apply internally. Do not use for more than 7 consecutive days. 45 g 1   Vitamin D , Ergocalciferol , (DRISDOL ) 1.25 MG (50000 UNIT) CAPS capsule Take 1 capsule (50,000 Units total) by mouth every 7 (seven) days. Take for 8 total doses(weeks) 8 capsule 0   OLANZapine  (ZYPREXA ) 5 MG tablet Take 1 tablet (5 mg total) by mouth at bedtime. 30 tablet 2   No current facility-administered medications for this visit.    HISTORY OF PRESENT ILLNESS:   Oncology History  Malignant neoplasm of tonsillar fossa (HCC)  07/20/2023 Imaging   CT neck soft tissue: 2.5 x 3 cm mass in the left tonsillar region with extension towards the tongue base, suspicious  for carcinoma, and evidence of level 2 lymphadenopathy on the left, consisting of a dominant node measuring 3 x 2 x 1.7 cm. A benign appearing osteoma was also demonstrated, projecting inferior from the angle of the left mandible.    08/26/2023 Initial Diagnosis   Malignant neoplasm of tonsillar fossa [facility]):  He presented to his PCP for routine follow-up in July 2024 and was incidentally noted to have left submandibular swelling on a routine examination performed at that time. A soft tissue head and neck ultrasound was subsequently performed on 07/07/23 which showed an ovoid mass measuring 2.8 x 1.9 cm, most consistent with an abnormally enlarged lymph node, and correlating with the palpable area in the left submandibular region.    A soft tissue neck CT was also performed on 07/20/23 which demonstrated: a  2.5 x 3 cm mass in the left tonsillar region with extension towards the tongue base, suspicious for carcinoma, and evidence of level 2 lymphadenopathy on the left, consisting of a dominant node measuring 3 x 2 x 1.7 cm. A benign appearing osteoma was also demonstrated, projecting inferior from the angle of the left mandible.   Subsequently, the patient was referred to Dr. Denys at [facility]  ENT and underwent biopsies of the left tonsil on 08/26/23. 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    08/26/2023 Pathology Results   Patient was referred to Dr. Denys at [facility] and underwent biopsies of the left tonsil on 08/26/23. 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    09/07/2023 PET scan   PET scan performed on 09/07/23 at [facility]: intense radiotracer uptake within the left palatine tonsillar mass; adjacent radiotracer uptake within the region of the right palatine tonsil; and hypermetabolic nodes in the left neck extending to the supraclavicular region concerning for nodal  metastases. Focal uptake at the anorectal junction was also demonstrated, possibly reflecting muscular contraction, however local malignancy can not be excluded. PET otherwise showed no evidence of more distant metastatic disease.    09/14/2023 Cancer Staging   Staging form: Pharynx - HPV-Mediated Oropharynx, AJCC 8th Edition - Clinical stage from 09/14/2023: Stage I (cT2, cN1, cM0, p16+) - Signed by [name], MD on 09/14/2023 Stage prefix: Initial diagnosis   09/29/2023 - 10/21/2023 Chemotherapy   Patient is on Treatment Plan : HEAD/NECK Cisplatin  (40) q7d         REVIEW OF SYSTEMS:   Review of Systems - Oncology  All other pertinent systems were reviewed with the patient and are negative.   VITALS:   Blood pressure 115/68, pulse 70, temperature 97.7 F (36.5 C), resp. rate 16, height 5' 8 (1.727 m), weight 156 lb 11.2 oz (71.1 kg), SpO2 99%.  Wt Readings from Last 3 Encounters:  08/24/24 156 lb 11.2 oz (71.1 kg)  08/15/24 151 lb 12.8 oz (68.9 kg)  06/20/24 149 lb 9.6 oz (67.9 kg)    Body mass index is 23.83 kg/m.   Onc Performance Status - 08/24/24 1559       ECOG Perf Status   ECOG Perf Status Restricted in physically strenuous activity but ambulatory and able to carry out work of a light or sedentary nature, e.g., light house work, office work      KPS SCALE   KPS % SCORE Normal activity with effort, some s/s of disease           PHYSICAL EXAM:   Physical Exam Constitutional:      General: He is not in acute distress.    Appearance: Normal appearance.  HENT:     Head: Normocephalic and atraumatic.     Mouth/Throat:     Mouth: Mucous membranes are moist.     Comments: No evidence of erythema or thrush Eyes:     General: No scleral icterus.    Conjunctiva/sclera: Conjunctivae normal.  Cardiovascular:     Rate and Rhythm: Normal rate and regular rhythm.     Pulses: Normal pulses.     Heart sounds: Normal heart sounds.  Pulmonary:     Effort:  Pulmonary effort is normal.     Breath sounds: Normal breath sounds.  Abdominal:     General: There is no distension.  Musculoskeletal:     Right lower leg: No edema.     Left lower leg: No edema.  Lymphadenopathy:     Cervical: No cervical adenopathy.  Skin:    Findings: No rash.  Neurological:     General: No focal deficit present.     Mental Status: He is alert and oriented to person, place, and time.  Psychiatric:        Mood and Affect: Mood normal.  Behavior: Behavior normal.        Thought Content: Thought content normal.      LABORATORY DATA:   I have reviewed the data as listed.  Results for orders placed or performed in visit on 08/24/24  Lactate dehydrogenase  Result Value Ref Range   LDH 107 98 - 192 U/L  CMP (Cancer Center only)  Result Value Ref Range   Sodium 139 135 - 145 mmol/L   Potassium 4.2 3.5 - 5.1 mmol/L   Chloride 106 98 - 111 mmol/L   CO2 28 22 - 32 mmol/L   Glucose, Bld 116 (H) 70 - 99 mg/dL   BUN 11 6 - 20 mg/dL   Creatinine 8.85 9.38 - 1.24 mg/dL   Calcium  9.5 8.9 - 10.3 mg/dL   Total Protein 6.9 6.5 - 8.1 g/dL   Albumin 4.3 3.5 - 5.0 g/dL   AST 18 15 - 41 U/L   ALT 14 0 - 44 U/L   Alkaline Phosphatase 60 38 - 126 U/L   Total Bilirubin 0.6 0.0 - 1.2 mg/dL   GFR, Estimated >39 >39 mL/min   Anion gap 5 5 - 15  CBC with Differential (Cancer Center Only)  Result Value Ref Range   WBC Count 2.7 (L) 4.0 - 10.5 K/uL   RBC 4.43 4.22 - 5.81 MIL/uL   Hemoglobin 13.7 13.0 - 17.0 g/dL   HCT 61.8 (L) 60.9 - 47.9 %   MCV 86.0 80.0 - 100.0 fL   MCH 30.9 26.0 - 34.0 pg   MCHC 36.0 30.0 - 36.0 g/dL   RDW 87.3 88.4 - 84.4 %   Platelet Count 202 150 - 400 K/uL   nRBC 0.0 0.0 - 0.2 %   Neutrophils Relative % 62 %   Neutro Abs 1.7 1.7 - 7.7 K/uL   Lymphocytes Relative 21 %   Lymphs Abs 0.6 (L) 0.7 - 4.0 K/uL   Monocytes Relative 14 %   Monocytes Absolute 0.4 0.1 - 1.0 K/uL   Eosinophils Relative 2 %   Eosinophils Absolute 0.1 0.0 - 0.5  K/uL   Basophils Relative 1 %   Basophils Absolute 0.0 0.0 - 0.1 K/uL   Immature Granulocytes 0 %   Abs Immature Granulocytes 0.00 0.00 - 0.07 K/uL       RADIOGRAPHIC STUDIES:  No recent pertinent imaging available to review.  CODE STATUS:  Code Status History     Date Active Date Inactive Code Status Order ID Comments User Context   09/22/2023 1045 09/23/2023 0514 Full Code [id number]  Hughes Simmonds, MD [facility]   09/22/2023 1045 09/22/2023 1045 Full Code [id number]  Hughes Simmonds, MD [facility]    Questions for Most Recent Historical Code Status (Order [id number])     Question Answer   By: Consent: discussion documented in EHR             Future Appointments  Date Time Provider Department Center  08/31/2024  2:10 PM Craig Alan JONELLE DEVONNA [facility]  09/21/2024 11:30 AM Theodoro Lakes, MD [facility] [address]  10/03/2024  2:15 PM [facility] None  10/03/2024  2:45 PM Kenyen Candy, Chinit[place], MD [facility] None  10/15/2024  8:00 PM Neysa Rama D, MD [facility]  11/06/2024 10:30 AM [facility] None  11/06/2024 11:00 AM Wyatt Leeroy HERO, PA-C [facility] None  06/24/2025  9:20 AM [name], MD [facility] [phone] [place]     This document was completed utilizing speech recognition software. Grammatical errors, random word  insertions, pronoun errors, and incomplete sentences are an occasional consequence of this system due to software limitations, ambient noise, and hardware issues. Any formal questions or concerns about the content, text or information contained within the body of this dictation should be directly addressed to the provider for clarification.

## 2024-08-24 NOTE — Assessment & Plan Note (Signed)
-   Hx of left tonsillar squamous cell carcinoma with a 3 cm lymph node. cT2,cN1,cM0, p16+.   - Previously discussed the benefits and risks of radiation alone vs combination of chemotherapy and radiation. Given the patient's age and the size of the lymph node which is borderline at 3 cm, the benefits of adding chemotherapy slightly outweigh the risks. After discussing side effect profile, patient did opt for weekly cisplatin .  Plan made to proceed with weekly cisplatin  at 40 mg/m dose during the course of radiation. He started radiation treatments from 09/27/2023.  Started first dose of cisplatin  on 09/29/2023.  -On the same day that he received first dose of cisplatin , he developed visual changes, described as a 3D effect and double vision. Although very rare, optic neuritis has been reported with cisplatin  use on literature review, especially when used in combination with other chemotherapeutic agents.  Though it is rare to happen with just 1 dose of cisplatin , given his symptoms, an ophthalmology referral was placed and we held Cisplatin  on 10/05/23. [name] with ophthalmology evaluated patient on 10/06/2023.  No signs of optic neuritis. Patient was cleared to resume cisplatin .  - We resumed cisplatin  from 10/14/2023 and continued this weekly during the course of radiation.  - He received total of 4 doses of cisplatin . He was hospitalized on 11/01/2023 for neutropenic fever, pain from mucositis.  ANC slowly improved.  Infectious workup was negative.  He was discharged home on 11/05/2023.  -Given issues with neutropenia and the fact that his mucositis was getting worse with chemotherapy, patient opted to not receive further chemotherapy. He completed radiation treatments on 11/16/2023.    -He is slowly recovering from treatment-related side effects.  Swallowing function has been improving.  No longer has mouth sores.  Restaging PET scan on 02/20/2024 showed no evidence of residual carcinoma in the  oropharynx or hypopharynx.  No hypermetabolic cervical lymph nodes.  Incidentally noted was new rounded intense radiotracer activity in the lateral right abdomen adjacent to bowel and liver, favoring abscess formation related to recent bowel surgery.  Patient had CT abdomen and pelvis for further evaluation scheduled on 02/27/2024 followed by appointment with Dr. [name], surgeon.  Eventually this was managed by IR and no issues currently.  He had recent ENT evaluation with Dr. [name] at [facility] on 05/23/2024.  Flexible laryngoscopy showed no evidence of disease.  Will continue surveillance as per NCCN guidelines for his tonsillar cancer.  RTC in November as scheduled with CT of the neck prior to visit.

## 2024-08-25 LAB — ANA W/REFLEX IF POSITIVE: Anti Nuclear Antibody (ANA): NEGATIVE

## 2024-08-26 DIAGNOSIS — C090 Malignant neoplasm of tonsillar fossa: Secondary | ICD-10-CM | POA: Diagnosis not present

## 2024-08-27 ENCOUNTER — Ambulatory Visit

## 2024-08-27 ENCOUNTER — Other Ambulatory Visit

## 2024-08-27 LAB — SURGICAL PATHOLOGY

## 2024-08-28 LAB — FLOW CYTOMETRY

## 2024-08-29 LAB — METHYLMALONIC ACID, SERUM: Methylmalonic Acid, Quantitative: 151 nmol/L (ref 0–378)

## 2024-08-30 NOTE — Progress Notes (Addendum)
 08/31/2024 Harry Williams [id number] October 23, 1981  Referring provider: [name], MD Primary GI doctor: Dr. 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 Hemorrhoids x years, intermittent rectal bleeding x 2004 Worsening over the last year with BRB consistently/daily in the toilet and on TP, BM 3-4 x a day can be up to 7 x a day Denies rectal pain, burning itching but does feel sometime at this rectum that he has to push back in APC gene history 01/2024 PET scan negative 02/27/2024 CTAP W mild to moderate wall thickening residual rectum and distal small bowel possibly reactive due to previous abscess 03/12/2024 proctocolectomy with J-pouch deep pelvis no evidence of bowel wall thickening distention or inflammatory changes Possible hemorrhoids, cuffitis, prolapse, colitis -C. difficile and stool culture. -will schedule for pouchoscopy, We have discussed the risks of bleeding, infection, perforation, medication reactions, and remote risk of death associated with pouchoscopy. All questions were answered and the patient acknowledges these risk and wishes to proceed. - sent in hydrocortisone  suppositories - consider follow up with Dr. Minnie at [facility]  Familial adenomatosis polyposis status post total colectomy 2016 with ileoanal anastomosis/J-pouch still low reserve now presenting with rectal bleeding or hemorrhoids History of APC gene followed by Dr. Autumn in oncology 01/2024 PET scan negative If patient had TPC with ileal pouch-anal anastomosis (IPAA), then endoscopic evaluations of the ileal pouch and rectal cuff annually depending on polyp burden. Surveillance frequency should be shortened to every 6 mo for large, flat polyps with villous histology and/or high-grade dysplasia identified. Not a candidate for VCE with history of SBO Suggest monitoring liver ultrasound every 2 to 5  years  Duodenal polyp in setting of familial adenomatosis polyposis/APC 06/04/2021 EGD small amount of food residue in the stomach adenoma major papula biopsied single duodenal polyp resected and retrieved 07/06/2021 EGD for duodenal polyps 15 mm sessile polyp no bleeding suggest repeat endoscopy 6 months Denies GERD, nausea, vomiting Will schedule EGD with pouchoscopy I discussed risks of EGD with patient today, including risk of sedation, bleeding or perforation.  Patient provides understanding and gave verbal consent to proceed.  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 [name] 02/27/2024 for CT status post IR drainage Previous G-tube placement March 2025  Small bowel obstruction 02/01/2024 OR exploratory laparotomy with small bowel resection and repair of small bowel serosa lysis of adhesions CT 02/27/2024 pigtail drain anterior lower abdomen status post subtotal colectomy with mild to moderate wall thickening of the residual rectum and distal small bowel possibly reactive given recent abscess but cannot rule out anterior colitis Repeat CT April unremarkable  Leukopenia induced chemotherapy Following with oncology  Internal jugular vein thromboembolism 11/18/2023 near port site Was on Lovenox  switched to Eliquis  but has been stopped No chest pain, SOB, swelling in legs Port has been removed  Patient Care Team: [name], MD as PCP - General (Family Medicine) [name], MD as Referring Physician (Otolaryngology) [name], MD as  Attending Physician (Radiation Oncology) [name], [name], RN as Oncology Nurse Navigator [name], MD as Consulting  Physician (Oncology)  HISTORY OF PRESENT ILLNESS: 43 y.o. male with a past medical history listed below presents for evaluation of rectal bleeding.   Discussed the use of AI scribe software for clinical note transcription with the patient, who gave verbal consent to proceed.  History of Present Illness   Harry Williams is a 43 year old male with familial adenomatous polyposis who presents with rectal bleeding and hemorrhoids. He is accompanied by his wife.  He has a long-standing history of rectal bleeding and hemorrhoids, with the bleeding worsening over the past year. The bleeding is bright red and appears on toilet paper and in the toilet. He experiences frequent bowel movements, typically three to four times a day, but occasionally up to seven times a day. He does not use fiber supplements and has no rectal pain, burning, or itching. He feels something at the rectum that he has to manually push back in.  He has a history of familial adenomatous polyposis and underwent a colectomy with J-pouch formation in 2016. He does not recall having a rectal evaluation or pouchoscopy since the surgery. A colonoscopy due to rectal bleeding revealed polyps, and duodenal polyps were removed during an endoscopy in 2022. No heartburn, reflux, nausea, or vomiting.  He has a history of left tonsillar squamous cell carcinoma diagnosed in October, for which he underwent chemotherapy and radiation. He did not respond well to chemotherapy. A recent PET scan showed no significant findings except for a possible abscess formation, which required IR drainage.  He experienced a blood clot near the port site, treated with Lovenox  and Eliquis . He has since discontinued Eliquis  due to affordability and medical advice. He was on a heparin  drip during a hospital stay for a small bowel resection, and a  subsequent scan showed resolution of the clot.  No chest pain, shortness of breath, leg swelling, abdominal pain, fever, or chills. His last CBC showed stable white blood cell count and hemoglobin levels.      He  reports that he has never smoked. He has never used smokeless tobacco. He reports current alcohol use. He reports that he does not use drugs.  RELEVANT GI HISTORY, IMAGING AND LABS: Results   LABS CBC: Leukocyte count fluctuating, hemoglobin normal  RADIOLOGY PET scan: No significant findings except possible abscess formation  DIAGNOSTIC Rectal evaluation: Rectal wall thickening, possible colitis (02/27/2024) Rectal evaluation: Normal (April 2025)      CBC    Component Value Date/Time   WBC 2.7 (L) 08/24/2024 1528   WBC 3.9 (L) 02/24/2024 1534   RBC 4.43 08/24/2024 1528   HGB 13.7 08/24/2024 1528   HGB 14.2 07/23/2024 1354   HCT 38.1 (L) 08/24/2024 1528   HCT 42.9 07/23/2024 1354   PLT 202 08/24/2024 1528   PLT 211 07/23/2024 1354   MCV 86.0 08/24/2024 1528   MCV 92 07/23/2024 1354   MCH 30.9 08/24/2024 1528   MCHC 36.0 08/24/2024 1528   RDW 12.6 08/24/2024 1528   RDW 13.3 07/23/2024 1354   LYMPHSABS 0.6 (L) 08/24/2024 1528   MONOABS 0.4 08/24/2024 1528   EOSABS 0.1 08/24/2024 1528   BASOSABS 0.0 08/24/2024 1528   Recent Labs    02/09/24 0607 02/10/24 0527 02/11/24 0655 02/12/24 0307 02/13/24 0248 02/14/24 0350 02/24/24 1534 05/30/24 1415 07/23/24 1354 08/24/24 1528  HGB 9.3* 9.2* 9.9* 10.3* 10.8* 10.5* 11.3* 12.3* 14.2 13.7    CMP     Component Value Date/Time   NA 139 08/24/2024 1528   NA  142 07/23/2024 1354   K 4.2 08/24/2024 1528   CL 106 08/24/2024 1528   CO2 28 08/24/2024 1528   GLUCOSE 116 (H) 08/24/2024 1528   BUN 11 08/24/2024 1528   BUN 11 07/23/2024 1354   CREATININE 1.14 08/24/2024 1528   CALCIUM  9.5 08/24/2024 1528   PROT 6.9 08/24/2024 1528   PROT 7.2 07/23/2024 1354   ALBUMIN 4.3 08/24/2024 1528   ALBUMIN 4.7  07/23/2024 1354   AST 18 08/24/2024 1528   ALT 14 08/24/2024 1528   ALKPHOS 60 08/24/2024 1528   BILITOT 0.6 08/24/2024 1528   GFRNONAA >60 08/24/2024 1528   GFRAA >60 12/19/2016 0514      Latest Ref Rng & Units 08/24/2024    3:28 PM 07/23/2024    1:54 PM 05/30/2024    2:15 PM  Hepatic Function  Total Protein 6.5 - 8.1 g/dL 6.9  7.2  7.0   Albumin 3.5 - 5.0 g/dL 4.3  4.7  4.3   AST 15 - 41 U/L 18  19  14    ALT 0 - 44 U/L 14  13  10    Alk Phosphatase 38 - 126 U/L 60  73  54   Total Bilirubin 0.0 - 1.2 mg/dL 0.6  0.7  0.4       Current Medications:    Current Outpatient Medications (Cardiovascular):    sildenafil  (VIAGRA ) 50 MG tablet, Take 1 tablet (50 mg total) by mouth daily as needed for erectile dysfunction.   Current Outpatient Medications (Analgesics):    celecoxib  (CELEBREX ) 200 MG capsule, Place 1 capsule (200 mg total) into feeding tube daily as needed for moderate pain (pain score 4-6).   Current Outpatient Medications (Other):    hydrocortisone  (ANUSOL -HC) 25 MG suppository, Place 1 suppository (25 mg total) rectally 2 (two) times daily.   Na Sulfate-K Sulfate-Mg Sulfate concentrate (SUPREP) 17.5-3.13-1.6 GM/177ML SOLN, Take 1 kit (354 mLs total) by mouth once for 1 dose.   Dextromethorphan-buPROPion ER (AUVELITY ) 45-105 MG TBCR, Take 1 tablet by mouth in the morning and at bedtime.   traZODone  (DESYREL ) 50 MG tablet, Take 0.5-2 tablets (25-100 mg total) by mouth at bedtime as needed.   triamcinolone  cream (KENALOG ) 0.1 %, Apply a thin layer to the affected external anal area twice daily. Do not apply internally. Do not use for more than 7 consecutive days.   Vitamin D , Ergocalciferol , (DRISDOL ) 1.25 MG (50000 UNIT) CAPS capsule, Take 1 capsule (50,000 Units total) by mouth every 7 (seven) days. Take for 8 total doses(weeks)  Medical History:  Past Medical History:  Diagnosis Date   AKI (acute kidney injury) 01/31/2024   Anxiety    Asthma    Avascular necrosis of  hip, left (HCC) 07/24/2021   Bright red blood per rectum 12/16/2022   Closed fracture of nasal bone with routine healing 12/24/2016   Depression    DVT (deep venous thrombosis) (HCC)    FAP (familial adenomatous polyposis)    PTSD (post-traumatic stress disorder)    SBO (small bowel obstruction) (HCC)    Tonsil cancer (HCC)    Allergies: No Known Allergies   Surgical History:  He  has a past surgical history that includes Appendectomy; Colon surgery (03/30/2015); Joint replacement (2008); Total abdominal proctocolectomy, J-pouch, ileal pouch anal anastomosis (2016); IR IMAGING GUIDED PORT INSERTION (09/22/2023); IR GASTROSTOMY TUBE MOD SED (09/22/2023); laparotomy (N/A, 02/01/2024); IR Replc Gastro/Colonic Tube Percut W/Fluoro (02/08/2024); IR Radiologist Eval & Mgmt (03/12/2024); IR REMOVAL TUN ACCESS W/ PORT W/O FL  MOD SED (06/08/2024); IR GASTROSTOMY TUBE REMOVAL/REPAIR (06/08/2024); knee surgery (Right); and arm surgery (Right). Family History:  His family history includes Anemia in his mother; Arthritis in his mother; Asthma in his daughter; Colon cancer in his paternal grandfather; Colon cancer (age of onset: 74 - 68) in his half-brother; Colon polyps in his half-sister; Colon polyps (age of onset: 55) in his half-brother; Colon polyps (age of onset: 64) in his father; Diabetes in his father and paternal grandmother; Heart disease in his paternal grandmother; Hypertension in his brother; Liver disease in his maternal uncle.  REVIEW OF SYSTEMS  : All other systems reviewed and negative except where noted in the History of Present Illness.  PHYSICAL EXAM: BP 90/70 (BP Location: Left Arm, Patient Position: Sitting, Cuff Size: Normal)   Pulse 84   Ht 5' 7 (1.702 m) Comment: height measured without shoes  Wt 154 lb 4 oz (70 kg)   BMI 24.16 kg/m  Physical Exam   GENERAL APPEARANCE: Well nourished, in no apparent distress. HEENT: No cervical lymphadenopathy, unremarkable thyroid , sclerae  anicteric, conjunctiva pink. RESPIRATORY: Respiratory effort normal, breath sounds equal bilaterally without rales, rhonchi, or wheezing. CARDIO: Regular rate and rhythm with no murmurs, rubs, or gallops, peripheral pulses intact. ABDOMEN: Soft, non-distended, active bowel sounds in all four quadrants, no tenderness to palpation, no rebound, no mass appreciated. RECTAL: No hemorrhoids or fissures, small anterior skin tag, rectum non-tender, possible hemorrhoids, negative for blood. MUSCULOSKELETAL: Full range of motion, normal gait, without edema. SKIN: Dry, intact without rashes or lesions. No jaundice. NEURO: Alert, oriented, no focal deficits. PSYCH: Cooperative, normal mood and affect.      Alan JONELLE Coombs, PA-C 3:17 PM

## 2024-08-31 ENCOUNTER — Ambulatory Visit (INDEPENDENT_AMBULATORY_CARE_PROVIDER_SITE_OTHER): Admitting: Physician Assistant

## 2024-08-31 ENCOUNTER — Telehealth: Payer: Self-pay

## 2024-08-31 ENCOUNTER — Encounter: Payer: Self-pay | Admitting: Physician Assistant

## 2024-08-31 ENCOUNTER — Encounter: Payer: Self-pay | Admitting: Oncology

## 2024-08-31 VITALS — BP 90/70 | HR 84 | Ht 67.0 in | Wt 154.2 lb

## 2024-08-31 DIAGNOSIS — R197 Diarrhea, unspecified: Secondary | ICD-10-CM | POA: Diagnosis not present

## 2024-08-31 DIAGNOSIS — K625 Hemorrhage of anus and rectum: Secondary | ICD-10-CM | POA: Diagnosis not present

## 2024-08-31 DIAGNOSIS — K648 Other hemorrhoids: Secondary | ICD-10-CM

## 2024-08-31 DIAGNOSIS — D1391 Familial adenomatous polyposis: Secondary | ICD-10-CM | POA: Diagnosis not present

## 2024-08-31 DIAGNOSIS — K317 Polyp of stomach and duodenum: Secondary | ICD-10-CM

## 2024-08-31 MED ORDER — NA SULFATE-K SULFATE-MG SULF 17.5-3.13-1.6 GM/177ML PO SOLN: 1.0000 | Freq: Once | ORAL | 0 refills | Status: AC

## 2024-08-31 MED ORDER — HYDROCORTISONE ACETATE 25 MG RE SUPP: 25.0000 mg | Freq: Two times a day (BID) | RECTAL | 0 refills | Status: AC

## 2024-08-31 NOTE — Telephone Encounter (Signed)
 Copied from CRM #[id number]. Topic: General - Other >> Aug 31, 2024 10:35 AM Harry Williams wrote: Reason for CRM: Patient is calling in because he had a virtual appointment on 08/21/24 and it was marked as No Show patient says that he arrived to the video visit, but the provider never showed up on screen. Patient just wanted to make the office aware.

## 2024-08-31 NOTE — Telephone Encounter (Signed)
 Sent pt my chart message to schedule VV.

## 2024-08-31 NOTE — Patient Instructions (Addendum)
 We have sent the following medications to your pharmacy for you to pick up at your convenience: hydrocortisone  suppository.  Your provider has requested that you go to the basement level for lab work before leaving today. Press B on the elevator. The lab is located at the first door on the left as you exit the elevator. Drop off your [facility] stool test.  You have been scheduled for an endoscopy and pouchoscopy. Please follow the written instructions given to you at your visit today.  If you use inhalers (even only as needed), please bring them with you on the day of your procedure.  DO NOT TAKE 7 DAYS PRIOR TO TEST- Trulicity (dulaglutide) Ozempic, Wegovy (semaglutide) Mounjaro (tirzepatide) Bydureon Bcise (exanatide extended release)  DO NOT TAKE 1 DAY PRIOR TO YOUR TEST Rybelsus (semaglutide) Adlyxin (lixisenatide) Victoza (liraglutide) Byetta (exanatide) ___________________________________________________________________________  _______________________________________________________  If your blood pressure at your visit was 140/90 or greater, please contact your primary care physician to follow up on this.  _______________________________________________________  If you are age 42 or older, your body mass index should be between 23-30. Your Body mass index is 24.16 kg/m. If this is out of the aforementioned range listed, please consider follow up with your Primary Care Provider.  If you are age 19 or younger, your body mass index should be between 19-25. Your Body mass index is 24.16 kg/m. If this is out of the aformentioned range listed, please consider follow up with your Primary Care Provider.   ________________________________________________________  The [facility] providers would like to encourage you to use MYCHART to communicate with providers for non-urgent requests or questions.  Due to long hold times on the telephone, sending your provider a message by [facility]  may be a faster and more efficient way to get a response.  Please allow 48 business hours for a response.  Please remember that this is for non-urgent requests.  _______________________________________________________  [facility] is using a team-based approach to care.  Your team is made up of your doctor and two to three APPS. Our APPS (Nurse Practitioners and Physician Assistants) work with your physician to ensure care continuity for you. They are fully qualified to address your health concerns and develop a treatment plan. They communicate directly with your gastroenterologist to care for you. Seeing the Advanced Practice Practitioners on your physician's team can help you by facilitating care more promptly, often allowing for earlier appointments, access to diagnostic testing, procedures, and other specialty referrals.

## 2024-09-05 ENCOUNTER — Telehealth: Payer: Self-pay

## 2024-09-05 ENCOUNTER — Other Ambulatory Visit: Payer: Self-pay

## 2024-09-05 DIAGNOSIS — R197 Diarrhea, unspecified: Secondary | ICD-10-CM

## 2024-09-05 DIAGNOSIS — K625 Hemorrhage of anus and rectum: Secondary | ICD-10-CM

## 2024-09-05 NOTE — Telephone Encounter (Signed)
 Spoke with patient and informed him that he will need to repeat the stool test. Patient agreed and [facility] stool test kit labeled and left at the front desk on the 3rd floor for pick up.

## 2024-09-05 NOTE — Telephone Encounter (Signed)
 Received a fax from [facility] lab stating the test for Gastrointestinal panel was not performed due to specimen tube had no patient identifiers.   Specimen will have to be recollected.   Called patient with no answer and no voicemail to leave a message.

## 2024-09-17 ENCOUNTER — Encounter: Payer: Self-pay | Admitting: Oncology

## 2024-09-17 ENCOUNTER — Ambulatory Visit (HOSPITAL_COMMUNITY): Admission: RE | Admit: 2024-09-17 | Payer: Self-pay | Source: Ambulatory Visit

## 2024-09-19 ENCOUNTER — Encounter: Payer: Self-pay | Admitting: Oncology

## 2024-09-19 ENCOUNTER — Telehealth: Payer: Self-pay

## 2024-09-19 NOTE — Telephone Encounter (Signed)
 Spoke with patient's wife on the phone to follow up on need to have a scan scheduled. She said her husband was out of town right now and that she would communicate with him and call us  back to get the scan scheduled per Dr. Autumn.

## 2024-09-21 ENCOUNTER — Ambulatory Visit

## 2024-09-21 ENCOUNTER — Telehealth: Payer: Self-pay

## 2024-09-21 ENCOUNTER — Encounter: Payer: Self-pay | Admitting: Oncology

## 2024-09-21 NOTE — Telephone Encounter (Signed)
 Called pt to ask to reschedule due to sleep study not being scheduled until 10/15/24. Pawar would like him scheduled mid November. LVMTCB

## 2024-10-03 ENCOUNTER — Inpatient Hospital Stay: Payer: Self-pay | Admitting: Oncology

## 2024-10-03 ENCOUNTER — Inpatient Hospital Stay: Payer: Self-pay

## 2024-10-03 ENCOUNTER — Telehealth: Payer: Self-pay

## 2024-10-03 NOTE — Telephone Encounter (Signed)
 Followed up with patient regarding missed appointments including lab work and with Dr. Autumn in Oncology. Patient stated that he cancelled his appointment per our automated system. Patient wishes to reschedule.

## 2024-10-05 ENCOUNTER — Encounter: Admitting: Gastroenterology

## 2024-10-05 ENCOUNTER — Telehealth: Payer: Self-pay | Admitting: Gastroenterology

## 2024-10-05 NOTE — Telephone Encounter (Signed)
 Good Afternoon Dr. Charlanne,  I called this patient on 10-01-24  (Monday). I had noticed that he answered NO on the automated answering service which means he wanted to cancel the procedure.   Protocol is to call and confirm that he actually did want to cancel since sometimes it is done in error.   However,  in this case I have been calling and leaving messages for him to call back and confirm he actually wanted to cancel this procedure.  He never called back?  Therefore, I did not take him off your schedule since he never confirmed.

## 2024-10-15 ENCOUNTER — Encounter (HOSPITAL_BASED_OUTPATIENT_CLINIC_OR_DEPARTMENT_OTHER): Admitting: Internal Medicine

## 2024-10-29 ENCOUNTER — Ambulatory Visit (HOSPITAL_COMMUNITY)
Admission: RE | Admit: 2024-10-29 | Discharge: 2024-10-29 | Disposition: A | Payer: Self-pay | Source: Ambulatory Visit | Attending: Oncology

## 2024-10-29 DIAGNOSIS — C090 Malignant neoplasm of tonsillar fossa: Secondary | ICD-10-CM | POA: Insufficient documentation

## 2024-10-29 DIAGNOSIS — I82C11 Acute embolism and thrombosis of right internal jugular vein: Secondary | ICD-10-CM | POA: Insufficient documentation

## 2024-10-29 MED ORDER — IOHEXOL 300 MG/ML  SOLN
100.0000 mL | Freq: Once | INTRAMUSCULAR | Status: AC | PRN
  Administered 2024-10-29: 100 mL via INTRAVENOUS

## 2024-10-29 MED ORDER — SODIUM CHLORIDE (PF) 0.9 % IJ SOLN
INTRAMUSCULAR | Status: AC
  Filled 2024-10-29: qty 50

## 2024-10-30 ENCOUNTER — Telehealth: Payer: Self-pay | Admitting: *Deleted

## 2024-10-30 NOTE — Telephone Encounter (Signed)
 Called patient to inform of CT for 11-01-24- arrival time- 3:15 pm @ [facility], no restrictions to scan, patient to have lab and fu appt. with Ronita Due on 11-06-24, lvm for a return call

## 2024-10-31 ENCOUNTER — Telehealth: Payer: Self-pay | Admitting: *Deleted

## 2024-10-31 NOTE — Telephone Encounter (Signed)
 Called patient to inform that Ct for 11-01-24 is not needed due to him having had one on 10-29-24,  spoke with patient and he is aware of this

## 2024-11-01 ENCOUNTER — Encounter (HOSPITAL_COMMUNITY): Payer: Self-pay

## 2024-11-01 ENCOUNTER — Ambulatory Visit (HOSPITAL_COMMUNITY): Payer: Self-pay

## 2024-11-01 ENCOUNTER — Other Ambulatory Visit: Payer: Self-pay | Admitting: Oncology

## 2024-11-01 DIAGNOSIS — I82C11 Acute embolism and thrombosis of right internal jugular vein: Secondary | ICD-10-CM

## 2024-11-01 DIAGNOSIS — C090 Malignant neoplasm of tonsillar fossa: Secondary | ICD-10-CM

## 2024-11-05 NOTE — Progress Notes (Signed)
 Radiation Oncology         [phone] ________________________________  Name: Harry Williams MRN: [id number]  Date: 11/06/2024  DOB: 1981-01-12  Follow-Up Visit Note  CC: [name], MD  Denys Reyes Prom*  Diagnosis and Prior Radiotherapy:    C09.0 No diagnosis found. 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 He completed his treatment approximately 1 year ago.    He last saw Dr. [name] of ENT at [facility] on 05/23/2024. At that time NED was appreciated.   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   ***   There were no vitals taken for this visit.  Wt Readings from Last 3 Encounters:  08/31/24 154 lb 4 oz (70 kg)  08/24/24 156 lb 11.2 oz (71.1 kg)  08/15/24 151 lb 12.8 oz (68.9 kg)     ALLERGIES:  has no known allergies.  Meds: Current Outpatient Medications  Medication Sig Dispense Refill   celecoxib  (CELEBREX ) 200 MG capsule Place 1 capsule (200  mg total) into feeding tube daily as needed for moderate pain (pain score 4-6). 90 capsule 0   Dextromethorphan-buPROPion ER (AUVELITY ) 45-105 MG TBCR Take 1 tablet by mouth in the morning and at bedtime. 60 tablet 3   hydrocortisone  (ANUSOL -HC) 25 MG suppository Place 1 suppository (25 mg total) rectally 2 (two) times daily. 12 suppository 0   sildenafil  (VIAGRA ) 50 MG tablet Take 1 tablet (50 mg total) by mouth daily as needed for erectile dysfunction. 10 tablet 0   traZODone  (DESYREL ) 50 MG tablet Take 0.5-2 tablets (25-100 mg total) by mouth at bedtime as needed. 60 tablet 0   triamcinolone  cream (KENALOG ) 0.1 % Apply a thin layer to the affected external anal area twice daily. Do not apply internally. Do not use for more than 7 consecutive days. 45 g 1   Vitamin D , Ergocalciferol , (DRISDOL ) 1.25 MG (50000 UNIT) CAPS capsule Take 1 capsule (50,000 Units total) by mouth every 7 (seven) days. Take for 8 total doses(weeks) 8 capsule 0   No current facility-administered medications for this visit.    Physical Findings: The patient is in no acute distress. Patient is alert and oriented. Wt Readings from Last 3 Encounters:  08/31/24 154 lb 4 oz (70 kg)  08/24/24 156 lb 11.2 oz (71.1 kg)  08/15/24 151 lb 12.8 oz (68.9 kg)    vitals were not taken for this visit. .  General: Alert and oriented, in no acute distress HEENT: Head is normocephalic. Extraocular movements are intact. Oropharynx is notable for no concerning lesions. Tongue is midline. Neck: Neck is notable for  no concerning adenopathy.  Skin: Skin in treatment fields shows satisfactory healing .  Heart: Regular in rate and rhythm with no murmurs, rubs, or gallops. Chest: Clear to auscultation bilaterally, with no rhonchi, wheezes, or rales. Abdomen: bandages in place from recent surgery. PEG tube. Extremities: No cyanosis or edema. Lymphatics: see Neck Exam Psychiatric: Judgment and insight are intact. Affect is  appropriate.   Lab Findings: Lab Results  Component Value Date   WBC 2.7 (L) 08/24/2024   HGB 13.7 08/24/2024   HCT 38.1 (L) 08/24/2024   MCV 86.0 08/24/2024   PLT 202 08/24/2024    Lab Results  Component Value Date   TSH 1.810 07/23/2024    Radiographic Findings: CT Chest W Contrast Result Date: 10/29/2024 CLINICAL DATA:  Head/neck cancer.  * Tracking Code: BO * EXAM: CT CHEST WITH CONTRAST TECHNIQUE: Multidetector CT imaging of the chest was performed during intravenous contrast administration. RADIATION DOSE REDUCTION: This exam was performed according to the departmental dose-optimization program which includes automated exposure control, adjustment of the mA and/or kV according to patient size and/or use of iterative reconstruction technique. CONTRAST:  OMNIPAQUE  IOHEXOL  300 MG/ML  SOLN COMPARISON:  CT neck 10/29/2024 and PET 02/20/2024. FINDINGS: Cardiovascular: Right internal jugular vein is not visualized and was previously thrombosed on 11/18/2023. Heart size normal. No pericardial effusion. Mediastinum/Nodes: Thymus in the prevascular space. No pathologically enlarged mediastinal, hilar or axillary lymph nodes. Esophagus is grossly unremarkable. Lungs/Pleura: Lungs are clear. No pleural fluid. Airway is unremarkable. Upper Abdomen: Patient's arms create significant streak artifact in the upper abdomen, degrading image quality. Visualized portions of the liver, gallbladder, adrenal glands, kidneys, spleen, pancreas, stomach and bowel are grossly unremarkable. No upper abdominal adenopathy. Musculoskeletal: None. IMPRESSION: 1. No evidence of metastatic disease. 2. Chronically thrombosed right internal jugular vein, better seen on CT neck done the same day. Electronically Signed   By: Newell Eke M.D.   On: 10/29/2024 15:50   CT Soft Tissue Neck W Contrast Result Date: 10/29/2024 EXAM: CT NECK WITH CONTRAST 10/29/2024 01:40:13 PM TECHNIQUE: CT of the neck was performed with  the administration of 100 mL of iohexol  (OMNIPAQUE ) 300 MG/ML solution. Multiplanar reformatted images are provided for review. Automated exposure control, iterative reconstruction, and/or weight based adjustment of the mA/kV was utilized to reduce the radiation dose to as low as reasonably achievable. COMPARISON: CT neck 05/21/2024. CLINICAL HISTORY: Follow-up right IJ thrombus status in a patient with head and neck cancer. FINDINGS: AERODIGESTIVE TRACT: Persistent mild edema of the pharynx and supraglottic larynx, similar to the prior CT. Unchanged small volume retropharyngeal fluid without peripheral enhancement or significant mass effect. No recurrent mass. Patent airway. SALIVARY GLANDS: Likely post-treatment changes of the submandibular glands. The parotid glands are unremarkable. THYROID : Unremarkable. LYMPH NODES: No suspicious cervical lymphadenopathy. 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 Interval removal of the right IJ port-a-cath. Patent carotid and vertebral arteries, right external jugular vein, and left internal jugular vein. BRAIN, ORBITS, SINUSES AND MASTOIDS: Mild mucosal thickening in the left maxillary sinus. The included mastoid air cells are clear. No acute abnormality. LUNGS AND MEDIASTINUM: Chest reported separately. 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 Mild cervical spondylosis. No suspicious bone lesion. IMPRESSION: 1. 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 2. Unchanged pharyngeal and laryngeal mucosal edema and small volume retropharyngeal fluid. 3. No evidence of recurrent malignancy in the neck.  Electronically signed by: Dasie Hamburg MD 10/29/2024 03:22 PM EST RP Workstation: [id number]    Impression/Plan:    1) Head and Neck Cancer Status: In remission - recent CT imaging is reassuring ***  2) Nutritional Status: *** Wt Readings  from Last 3 Encounters:  08/31/24 154 lb 4 oz (70 kg)  08/24/24 156 lb 11.2 oz (71.1 kg)  08/15/24 151 lb 12.8 oz (68.9 kg)   Weight loss r/t recent bowel surgery, using PEG ***  3) Risk Factors: The patient has been educated about risk factors including alcohol and tobacco abuse; they understand that avoidance of alcohol and tobacco is important to prevent recurrences as well as other cancers ***  4) Swallowing: functional, continues to see SLP.   ***  5) Dental: Encouraged to continue regular followup with dentistry, and dental hygiene including fluoride rinses.  ***  6) Thyroid  function: Checking annually *** Lab Results  Component Value Date   TSH 1.810 07/23/2024    8) CT chest/ neck w/ contrast in 9 months  prior to rad/onc f/u. In the interim he will follow w/ Med onc and ENT.  Referral placed back to ENT at [facility].  The patient was encouraged to call with any issues or questions before then. Magic MW refilled today per pt request (mouth irritation). ***  On date of service, in total, I spent 40 minutes on this encounter. Patient was seen in person. -----------------------------------   Leeroy Due, PA-C

## 2024-11-06 ENCOUNTER — Ambulatory Visit
Admission: RE | Admit: 2024-11-06 | Discharge: 2024-11-06 | Disposition: A | Discharge status: Home / Self Care | Payer: Self-pay | Source: Ambulatory Visit | Attending: Radiology | Admitting: Radiology

## 2024-11-06 ENCOUNTER — Inpatient Hospital Stay
Admission: RE | Admit: 2024-11-06 | Discharge: 2024-11-06 | Payer: Self-pay | Attending: Radiology | Admitting: Radiology

## 2024-11-06 VITALS — BP 115/71 | HR 69 | Temp 97.8°F | Resp 18 | Ht 67.0 in | Wt 161.6 lb

## 2024-11-06 DIAGNOSIS — C090 Malignant neoplasm of tonsillar fossa: Secondary | ICD-10-CM

## 2024-11-06 LAB — TSH: TSH: 4.32 u[IU]/mL (ref 0.350–4.500)
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
## 2024-11-06 NOTE — Progress Notes (Addendum)
 Mr. Harry Williams is here for a follow up. He completed radiation treatment for malignant neoplasm of the tonsillar fossa on 11/16/2023.  Mr. Harry Williams is doing well post radiation treatment.  Pain issues, if any: None Using a feeding tube?: Feeding tube removed  Weight changes, if any: Gained 30 pounds since he completed treatment. Swallowing issues, if any: Has to drink water with each bite of food. Smoking or chewing tobacco? None Using fluoride toothpaste daily? Yes Last ENT visit was on: None Other notable issues, if any:  None  BP 115/71 (BP Location: Left Arm, Patient Position: Sitting, Cuff Size: Large)   Pulse 69   Temp 97.8 F (36.6 C)   Resp 18   Ht 5' 7 (1.702 m)   Wt 161 lb 9.6 oz (73.3 kg)   SpO2 100%   BMI 25.31 kg/m     Wt Readings from Last 3 Encounters:  11/06/24 161 lb 9.6 oz (73.3 kg)  08/31/24 154 lb 4 oz (70 kg)  08/24/24 156 lb 11.2 oz (71.1 kg)

## 2024-11-07 ENCOUNTER — Other Ambulatory Visit: Payer: Self-pay

## 2024-11-07 ENCOUNTER — Ambulatory Visit: Payer: Self-pay | Attending: Radiology

## 2024-11-07 DIAGNOSIS — C090 Malignant neoplasm of tonsillar fossa: Secondary | ICD-10-CM

## 2024-11-07 DIAGNOSIS — M436 Torticollis: Secondary | ICD-10-CM | POA: Insufficient documentation

## 2024-11-07 DIAGNOSIS — L598 Other specified disorders of the skin and subcutaneous tissue related to radiation: Secondary | ICD-10-CM

## 2024-11-07 DIAGNOSIS — R293 Abnormal posture: Secondary | ICD-10-CM

## 2024-11-07 DIAGNOSIS — Y842 Radiological procedure and radiotherapy as the cause of abnormal reaction of the patient, or of later complication, without mention of misadventure at the time of the procedure: Secondary | ICD-10-CM | POA: Insufficient documentation

## 2024-11-08 ENCOUNTER — Ambulatory Visit

## 2024-11-08 NOTE — Progress Notes (Signed)
 Oncology Nurse Navigator Documentation   Ronita Due PA requested that I schedule Harry Williams with Dr. Denys (ENT) at [facility] for an appointment in 5 months to follow up after his recent treatment here at [facility] for his head and neck cancer. I called Dr. Judye office and scheduled him on 04/10/25 at 11:45. I have notified Harry Williams of the appointment date and time and he is agreeable. He knows to contact me if he has any future concerns or questions.   Delon Jefferson RN, BSN, OCN Head & Neck Oncology Nurse Navigator [facility] Phone # [phone]  Fax # [phone]

## 2024-11-09 ENCOUNTER — Encounter: Payer: Self-pay | Admitting: Oncology

## 2024-11-09 ENCOUNTER — Other Ambulatory Visit (HOSPITAL_COMMUNITY): Payer: Self-pay

## 2024-11-09 ENCOUNTER — Inpatient Hospital Stay: Payer: Self-pay | Admitting: Oncology

## 2024-11-09 ENCOUNTER — Inpatient Hospital Stay: Payer: Self-pay | Attending: Oncology

## 2024-11-09 VITALS — BP 117/69 | HR 70 | Temp 98.6°F | Resp 17 | Ht 67.0 in | Wt 161.0 lb

## 2024-11-09 DIAGNOSIS — Z7901 Long term (current) use of anticoagulants: Secondary | ICD-10-CM | POA: Insufficient documentation

## 2024-11-09 DIAGNOSIS — C090 Malignant neoplasm of tonsillar fossa: Secondary | ICD-10-CM

## 2024-11-09 DIAGNOSIS — Z08 Encounter for follow-up examination after completed treatment for malignant neoplasm: Secondary | ICD-10-CM | POA: Insufficient documentation

## 2024-11-09 DIAGNOSIS — I82C11 Acute embolism and thrombosis of right internal jugular vein: Secondary | ICD-10-CM

## 2024-11-09 DIAGNOSIS — Z79899 Other long term (current) drug therapy: Secondary | ICD-10-CM | POA: Insufficient documentation

## 2024-11-09 DIAGNOSIS — Z85818 Personal history of malignant neoplasm of other sites of lip, oral cavity, and pharynx: Secondary | ICD-10-CM | POA: Insufficient documentation

## 2024-11-09 DIAGNOSIS — T451X5A Adverse effect of antineoplastic and immunosuppressive drugs, initial encounter: Secondary | ICD-10-CM

## 2024-11-09 DIAGNOSIS — D701 Agranulocytosis secondary to cancer chemotherapy: Secondary | ICD-10-CM

## 2024-11-09 LAB — CBC WITH DIFFERENTIAL (CANCER CENTER ONLY)
Abs Immature Granulocytes: 0 K/uL (ref 0.00–0.07)
Basophils Absolute: 0 K/uL (ref 0.0–0.1)
Basophils Relative: 1 %
Eosinophils Absolute: 0.1 K/uL (ref 0.0–0.5)
Eosinophils Relative: 2 %
HCT: 40.4 % (ref 39.0–52.0)
Hemoglobin: 14 g/dL (ref 13.0–17.0)
Immature Granulocytes: 0 %
Lymphocytes Relative: 20 %
Lymphs Abs: 0.7 K/uL (ref 0.7–4.0)
MCH: 30.6 pg (ref 26.0–34.0)
MCHC: 34.7 g/dL (ref 30.0–36.0)
MCV: 88.4 fL (ref 80.0–100.0)
Monocytes Absolute: 0.3 K/uL (ref 0.1–1.0)
Monocytes Relative: 10 %
Neutro Abs: 2.1 K/uL (ref 1.7–7.7)
Neutrophils Relative %: 67 %
Platelet Count: 176 K/uL (ref 150–400)
RBC: 4.57 MIL/uL (ref 4.22–5.81)
RDW: 12.5 % (ref 11.5–15.5)
WBC Count: 3.2 K/uL — ABNORMAL LOW (ref 4.0–10.5)
nRBC: 0 % (ref 0.0–0.2)

## 2024-11-09 LAB — BASIC METABOLIC PANEL - CANCER CENTER ONLY
Anion gap: 8 (ref 5–15)
BUN: 11 mg/dL (ref 6–20)
CO2: 29 mmol/L (ref 22–32)
Calcium: 9.3 mg/dL (ref 8.9–10.3)
Chloride: 107 mmol/L (ref 98–111)
Creatinine: 1.46 mg/dL — ABNORMAL HIGH (ref 0.61–1.24)
GFR, Estimated: >60 mL/min (ref >60)
Glucose, Bld: 100 mg/dL — ABNORMAL HIGH (ref 70–99)
Potassium: 4.1 mmol/L (ref 3.5–5.1)
Sodium: 144 mmol/L (ref 135–145)

## 2024-11-09 LAB — D-DIMER, QUANTITATIVE: D-Dimer, Quant: <0.27 ug{FEU}/mL (ref 0.00–0.50)

## 2024-11-09 MED ORDER — APIXABAN 5 MG PO TABS
5.0000 mg | ORAL_TABLET | Freq: Two times a day (BID) | ORAL | 2 refills | Status: AC
  Filled 2024-11-09: qty 60, 30d supply, fill #0

## 2024-11-09 NOTE — Progress Notes (Signed)
 [facility]  ONCOLOGY CLINIC PROGRESS NOTE   Patient Care Team: [name], MD as PCP - General (Family Medicine) [name], MD as Referring Physician (Otolaryngology) [name], MD as Attending Physician (Radiation Oncology) [name], [name], RN as Oncology Nurse Navigator [name], MD as Consulting Physician (Oncology)  Date of visit: 11/09/2024   ASSESSMENT & PLAN:   Very pleasant 43 y.o. 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  Treated with concurrent chemoradiation with weekly cisplatin  starting from 09/29/2023.  Received 4 doses of weekly cisplatin .  Rest of the chemo was held because of neutropenia and also per patient preference.  Malignant neoplasm of tonsillar fossa (HCC) - Hx of left tonsillar squamous cell carcinoma with a 3 cm lymph node. cT2,cN1,cM0, p16+.   - Previously discussed the benefits and risks of radiation alone vs combination of chemotherapy and radiation. Given the patient's age and the size of the lymph node which is borderline at 3 cm, the benefits of adding chemotherapy slightly outweigh the risks. After discussing side effect profile, patient did opt for weekly cisplatin .  Plan made to proceed with weekly cisplatin  at 40 mg/m dose during the course of radiation. He started radiation treatments from 09/27/2023.  Started first dose of cisplatin  on 09/29/2023.  -On the same day that he received first dose of cisplatin , he developed visual changes, described as a 3D effect and double vision. Although very rare, optic neuritis has been reported with cisplatin  use on literature review, especially when used in combination with other chemotherapeutic agents.  Though it is rare to happen with just 1  dose of cisplatin , given his symptoms, an ophthalmology referral was placed and we held Cisplatin  on 10/05/23. [name] with ophthalmology evaluated patient on 10/06/2023.  No signs of optic neuritis. Patient was cleared to resume cisplatin .  - We resumed cisplatin  from 10/14/2023 and continued this weekly during the course of radiation.  - He received total of 4 doses of cisplatin . He was hospitalized on 11/01/2023 for neutropenic fever, pain from mucositis.  ANC slowly improved.  Infectious workup was negative.  He was discharged home on 11/05/2023.  -Given issues with neutropenia and the fact that his mucositis was getting worse with chemotherapy, patient opted to not receive further chemotherapy. He completed radiation treatments on 11/16/2023.    Restaging PET scan on 02/20/2024 showed no evidence of residual carcinoma in the oropharynx or hypopharynx.  No hypermetabolic cervical lymph nodes.  Incidentally noted was new rounded intense radiotracer activity in the lateral right abdomen adjacent to bowel and liver, favoring abscess formation related to recent bowel surgery.  He had recent ENT evaluation with Dr. [name] at [facility] on 05/23/2024.  Flexible laryngoscopy showed no evidence of disease.  Restaging CT neck and CT chest on 10/29/2024 showed no evidence of disease recurrence.  He did have persistent right IJ thrombus on CT of the neck.  Clinically no symptoms or signs suggestive of disease recurrence.  Will continue surveillance as per NCCN guidelines for his tonsillar cancer.  He is to continue follow-up with radiation oncology and ENT departments  Internal jugular (IJ) vein thromboembolism, acute, right (HCC) Blood clot near the port site discovered in late December 2024.  Previously was on Lovenox  and later switched to Eliquis .  Patient stopped  the medication for a few weeks because of affordability issues.  Recent CT of the neck on 05/21/2024 showed persistent thrombosis  of the lower cervical segment of the right IJ vein.  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  D-dimer was undetectable on last visit.  Patient stopped Eliquis  again over the last couple of months.  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 Normal D-dimer supports absence of acute thrombus. Previously had difficulty accessing anticoagulation, but now has Eliquis  via copay card. - Prescribed Eliquis  for anticoagulation for at least three months; provided two refills. - Sent prescription to [employer]. - Reassess thrombus and anticoagulation needs at next follow-up in 3-4 months.  Leukopenia due to antineoplastic chemotherapy Leukopenia persists but is improving (WBC 3,200). Extensive workup for alternative etiologies, including autoimmune and bone marrow dysfunction, was negative. Attributed to prior chemotherapy. - Monitor white blood cell count at next follow-up in 3-4 months.  Acute kidney injury Creatinine elevated at 1.46. Denies NSAID use and reports adequate hydration. Etiology unclear; no symptoms of volume depletion or nephrotoxic exposure. - Monitor creatinine at next follow-up in 3-4 months.  I reviewed lab results and outside records for this visit and discussed relevant results with the patient. Diagnosis, plan of care and treatment options were also discussed in detail with the patient. Opportunity provided to ask questions and answers provided to his apparent satisfaction. Provided instructions to call our clinic with any problems, questions or concerns prior to return visit. I recommended to continue follow-up with PCP and sub-specialists. He verbalized understanding and agreed with the plan.   NCCN guidelines have been consulted in the planning of this patients care.  I provided 32 minutes of face-to-face time during this encounter and > 50% was spent counseling as documented  under my assessment and plan.    Chinita Patten, MD  11/09/2024 8:03 PM  [facility] AT [facility] [address] [place] [address] Dept: [phone] Dept Fax: [phone]    CHIEF COMPLAINT/ REASON FOR VISIT:   Recently diagnosed squamous of carcinoma of left tonsillar fossa, p16 positive.  Current Treatment: Received chemoradiation with weekly cisplatin , chemo started from 09/29/2023.  Chemotherapy complicated by neutropenia, neutropenic fever.  He received only 4 of the planned 7 cycles of cisplatin .  INTERVAL HISTORY:   Discussed the use of AI scribe software for clinical note transcription with the patient, who gave verbal consent to proceed.  History of Present Illness  Harry Williams is a 43 year old male with chronic right internal jugular vein thromboembolism and prior tonsillar fossa malignancy who presents for hematology/oncology follow-up.  He describes persistent taste disturbance, particularly diminished ability to taste sugar, but otherwise tolerates food well and maintains adequate oral intake.  He has a chronic right internal jugular vein thrombus identified on recent imaging, which has not resolved despite prior port removal. He is not currently on anticoagulation due to previous insurance barriers, but has recently obtained a copay card. He denies neck pain but notes stiffness across the back and significant right-sided neck stiffness since port removal. He is not performing heavy lifting and has not returned to work.  White blood cell count is currently 3,200 (previously 2,300 in August and 2,700 subsequently). Prior evaluation for alternative etiologies, including autoimmune and bone marrow dysfunction, was negative. He is not taking medications known to suppress bone marrow.  Creatinine is elevated at 1.46, up from prior values. He denies  NSAID use and reports adequate hydration.   I have reviewed the past medical history, past  surgical history, social history and family history with the patient and they are unchanged from previous note.  ALLERGIES: He has no known allergies.  MEDICATIONS:  Current Outpatient Medications  Medication Sig Dispense Refill   apixaban  (ELIQUIS ) 5 MG TABS tablet Take 1 tablet (5 mg total) by mouth 2 (two) times daily. 60 tablet 2   celecoxib  (CELEBREX ) 200 MG capsule Place 1 capsule (200 mg total) into feeding tube daily as needed for moderate pain (pain score 4-6). 90 capsule 0   Dextromethorphan-buPROPion ER (AUVELITY ) 45-105 MG TBCR Take 1 tablet by mouth in the morning and at bedtime. 60 tablet 3   hydrocortisone  (ANUSOL -HC) 25 MG suppository Place 1 suppository (25 mg total) rectally 2 (two) times daily. 12 suppository 0   sildenafil  (VIAGRA ) 50 MG tablet Take 1 tablet (50 mg total) by mouth daily as needed for erectile dysfunction. 10 tablet 0   traZODone  (DESYREL ) 50 MG tablet Take 0.5-2 tablets (25-100 mg total) by mouth at bedtime as needed. 60 tablet 0   triamcinolone  cream (KENALOG ) 0.1 % Apply a thin layer to the affected external anal area twice daily. Do not apply internally. Do not use for more than 7 consecutive days. 45 g 1   Vitamin D , Ergocalciferol , (DRISDOL ) 1.25 MG (50000 UNIT) CAPS capsule Take 1 capsule (50,000 Units total) by mouth every 7 (seven) days. Take for 8 total doses(weeks) 8 capsule 0   No current facility-administered medications for this visit.    HISTORY OF PRESENT ILLNESS:   Oncology History  Malignant neoplasm of tonsillar fossa (HCC)  07/20/2023 Imaging   CT neck soft tissue: 2.5 x 3 cm mass in the left tonsillar region with extension towards the tongue base, suspicious for carcinoma, and evidence of level 2 lymphadenopathy on the left, consisting of a dominant node measuring 3 x 2 x 1.7 cm. A benign appearing osteoma was also demonstrated, projecting inferior from the angle of the left mandible.    08/26/2023 Initial Diagnosis   Malignant  neoplasm of tonsillar fossa [facility]):  He presented to his PCP for routine follow-up in July 2024 and was incidentally noted to have left submandibular swelling on a routine examination performed at that time. A soft tissue head and neck ultrasound was subsequently performed on 07/07/23 which showed an ovoid mass measuring 2.8 x 1.9 cm, most consistent with an abnormally enlarged lymph node, and correlating with the palpable area in the left submandibular region.    A soft tissue neck CT was also performed on 07/20/23 which demonstrated: a  2.5 x 3 cm mass in the left tonsillar region with extension towards the tongue base, suspicious for carcinoma, and evidence of level 2 lymphadenopathy on the left, consisting of a dominant node measuring 3 x 2 x 1.7 cm. A benign appearing osteoma was also demonstrated, projecting inferior from the angle of the left mandible.   Subsequently, the patient was referred to Dr. Denys at [facility] ENT and underwent biopsies of the left tonsil on 08/26/23. 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    08/26/2023 Pathology Results   Patient was referred to Dr. Denys at [facility] and underwent biopsies of the left tonsil on 08/26/23. 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    09/07/2023 PET scan   PET scan performed on 09/07/23 at [facility]: intense radiotracer  uptake within the left palatine tonsillar mass; adjacent radiotracer uptake within the region of the right palatine tonsil; and hypermetabolic nodes in the left neck extending to the supraclavicular region concerning for nodal metastases. Focal uptake at the anorectal junction was also demonstrated, possibly reflecting muscular contraction, however local malignancy can not be excluded. PET otherwise showed no evidence of more distant metastatic disease.    09/14/2023 Cancer Staging   Staging form:  Pharynx - HPV-Mediated Oropharynx, AJCC 8th Edition - Clinical stage from 09/14/2023: Stage I (cT2, cN1, cM0, p16+) - Signed by [name], MD on 09/14/2023 Stage prefix: Initial diagnosis   09/29/2023 - 10/21/2023 Chemotherapy   Patient is on Treatment Plan : HEAD/NECK Cisplatin  (40) q7d       - He received total of 4 doses of cisplatin . He was hospitalized on 11/01/2023 for neutropenic fever, pain from mucositis.  ANC slowly improved.  Infectious workup was negative.  He was discharged home on 11/05/2023.  -Given issues with neutropenia and the fact that his mucositis was getting worse with chemotherapy, patient opted to not receive further chemotherapy. He completed radiation treatments on 11/16/2023.    Restaging PET scan on 02/20/2024 showed no evidence of residual carcinoma in the oropharynx or hypopharynx.  No hypermetabolic cervical lymph nodes.  Incidentally noted was new rounded intense radiotracer activity in the lateral right abdomen adjacent to bowel and liver, favoring abscess formation related to recent bowel surgery.  He had ENT evaluation with Dr. [name] at [facility] on 05/23/2024.  Flexible laryngoscopy showed no evidence of disease.  On 10/29/2024, restaging CT neck and CT chest showed no evidence of disease recurrence.  REVIEW OF SYSTEMS:   Review of Systems - Oncology  All other pertinent systems were reviewed with the patient and are negative.   VITALS:   Blood pressure 117/69, pulse 70, temperature 98.6 F (37 C), temperature source Temporal, resp. rate 17, height 5' 7 (1.702 m), weight 161 lb (73 kg), SpO2 99%.  Wt Readings from Last 3 Encounters:  11/09/24 161 lb (73 kg)  11/06/24 161 lb 9.6 oz (73.3 kg)  08/31/24 154 lb 4 oz (70 kg)    Body mass index is 25.22 kg/m.   Onc Performance Status - 11/09/24 1540       ECOG Perf Status   ECOG Perf Status Restricted in physically strenuous activity but ambulatory and able to carry out work of a  light or sedentary nature, e.g., light house work, office work      KPS SCALE   KPS % SCORE Normal activity with effort, some s/s of disease           PHYSICAL EXAM:   Physical Exam Constitutional:      General: He is not in acute distress.    Appearance: Normal appearance.  HENT:     Head: Normocephalic and atraumatic.     Mouth/Throat:     Mouth: Mucous membranes are moist.     Comments: No evidence of erythema or thrush Eyes:     General: No scleral icterus.    Conjunctiva/sclera: Conjunctivae normal.  Cardiovascular:     Rate and Rhythm: Normal rate and regular rhythm.     Pulses: Normal pulses.     Heart sounds: Normal heart sounds.  Pulmonary:     Effort: Pulmonary effort is normal.     Breath sounds: Normal breath sounds.  Abdominal:     General: There is no distension.  Musculoskeletal:     Right lower  leg: No edema.     Left lower leg: No edema.  Lymphadenopathy:     Cervical: No cervical adenopathy.  Skin:    Findings: No rash.  Neurological:     General: No focal deficit present.     Mental Status: He is alert and oriented to person, place, and time.  Psychiatric:        Mood and Affect: Mood normal.        Behavior: Behavior normal.        Thought Content: Thought content normal.      LABORATORY DATA:   I have reviewed the data as listed.  Results for orders placed or performed in visit on 11/09/24  Basic Metabolic Panel - Cancer Center Only  Result Value Ref Range   Sodium 144 135 - 145 mmol/L   Potassium 4.1 3.5 - 5.1 mmol/L   Chloride 107 98 - 111 mmol/L   CO2 29 22 - 32 mmol/L   Glucose, Bld 100 (H) 70 - 99 mg/dL   BUN 11 6 - 20 mg/dL   Creatinine 8.53 (H) 9.38 - 1.24 mg/dL   Calcium  9.3 8.9 - 10.3 mg/dL   GFR, Estimated >39 >39 mL/min   Anion gap 8 5 - 15  D-dimer, quantitative  Result Value Ref Range   D-Dimer, Quant <0.27 0.00 - 0.50 ug/mL-FEU  CBC with Differential (Cancer Center Only)  Result Value Ref Range   WBC Count 3.2  (L) 4.0 - 10.5 K/uL   RBC 4.57 4.22 - 5.81 MIL/uL   Hemoglobin 14.0 13.0 - 17.0 g/dL   HCT 59.5 60.9 - 47.9 %   MCV 88.4 80.0 - 100.0 fL   MCH 30.6 26.0 - 34.0 pg   MCHC 34.7 30.0 - 36.0 g/dL   RDW 87.4 88.4 - 84.4 %   Platelet Count 176 150 - 400 K/uL   nRBC 0.0 0.0 - 0.2 %   Neutrophils Relative % 67 %   Neutro Abs 2.1 1.7 - 7.7 K/uL   Lymphocytes Relative 20 %   Lymphs Abs 0.7 0.7 - 4.0 K/uL   Monocytes Relative 10 %   Monocytes Absolute 0.3 0.1 - 1.0 K/uL   Eosinophils Relative 2 %   Eosinophils Absolute 0.1 0.0 - 0.5 K/uL   Basophils Relative 1 %   Basophils Absolute 0.0 0.0 - 0.1 K/uL   Immature Granulocytes 0 %   Abs Immature Granulocytes 0.00 0.00 - 0.07 K/uL       RADIOGRAPHIC STUDIES:  No recent pertinent imaging available to review.  CODE STATUS:  Code Status History     Date Active Date Inactive Code Status Order ID Comments User Context   09/22/2023 1045 09/23/2023 0514 Full Code [id number]  Hughes Simmonds, MD [facility]   09/22/2023 1045 09/22/2023 1045 Full Code [id number]  Hughes Simmonds, MD [facility]    Questions for Most Recent Historical Code Status (Order [id number])     Question Answer   By: Consent: discussion documented in EHR             Future Appointments  Date Time Provider Department Center  11/14/2024  2:00 PM Aden Kins A, PTA [facility] None  11/21/2024 12:00 PM Walcott, Grayce PARAS, PT [facility] None  11/28/2024  2:00 PM Aden Kins A, PTA [facility] None  12/05/2024  2:00 PM Aden Kins LABOR, PTA [facility] None  12/12/2024  2:00 PM Canary Grayce PARAS, PT [facility] None  12/19/2024  2:00 PM Canary Grayce PARAS, PT [facility] None  02/07/2025 11:15  AM [facility] None  02/07/2025 11:45 AM Keora Eccleston, Chinita, MD [facility] None  06/24/2025  9:20 AM [name], MD [facility] [id number]  10/15/2025 11:00 AM Wyatt Leeroy HERO, PA-C [facility] None     This document was completed utilizing speech recognition software.  Grammatical errors, random word insertions, pronoun errors, and incomplete sentences are an occasional consequence of this system due to software limitations, ambient noise, and hardware issues. Any formal questions or concerns about the content, text or information contained within the body of this dictation should be directly addressed to the provider for clarification.

## 2024-11-09 NOTE — Assessment & Plan Note (Addendum)
 Leukopenia persists but is improving (WBC 3,200). Extensive workup for alternative etiologies, including autoimmune and bone marrow dysfunction, was negative. Attributed to prior chemotherapy. - Monitor white blood cell count at next follow-up in 3-4 months.

## 2024-11-09 NOTE — Assessment & Plan Note (Addendum)
 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  Previously was on Lovenox  and later switched to Eliquis .  Patient stopped the medication for a few weeks because of affordability issues.  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  D-dimer was undetectable on last visit.  Patient stopped Eliquis  again over the last couple of months.  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 Normal D-dimer supports absence of acute thrombus. Previously had difficulty accessing anticoagulation, but now has Eliquis  via copay card. - Prescribed Eliquis  for anticoagulation for at least three months; provided two refills. - Sent prescription to [employer]. - Reassess thrombus and anticoagulation needs at next follow-up in 3-4 months.

## 2024-11-09 NOTE — Assessment & Plan Note (Addendum)
-   Hx of left tonsillar squamous cell carcinoma with a 3 cm lymph node. cT2,cN1,cM0, p16+.   - Previously discussed the benefits and risks of radiation alone vs combination of chemotherapy and radiation. Given the patient's age and the size of the lymph node which is borderline at 3 cm, the benefits of adding chemotherapy slightly outweigh the risks. After discussing side effect profile, patient did opt for weekly cisplatin .  Plan made to proceed with weekly cisplatin  at 40 mg/m dose during the course of radiation. He started radiation treatments from 09/27/2023.  Started first dose of cisplatin  on 09/29/2023.  -On the same day that he received first dose of cisplatin , he developed visual changes, described as a 3D effect and double vision. Although very rare, optic neuritis has been reported with cisplatin  use on literature review, especially when used in combination with other chemotherapeutic agents.  Though it is rare to happen with just 1 dose of cisplatin , given his symptoms, an ophthalmology referral was placed and we held Cisplatin  on 10/05/23. [name] with ophthalmology evaluated patient on 10/06/2023.  No signs of optic neuritis. Patient was cleared to resume cisplatin .  - We resumed cisplatin  from 10/14/2023 and continued this weekly during the course of radiation.  - He received total of 4 doses of cisplatin . He was hospitalized on 11/01/2023 for neutropenic fever, pain from mucositis.  ANC slowly improved.  Infectious workup was negative.  He was discharged home on 11/05/2023.  -Given issues with neutropenia and the fact that his mucositis was getting worse with chemotherapy, patient opted to not receive further chemotherapy. He completed radiation treatments on 11/16/2023.    Restaging PET scan on 02/20/2024 showed no evidence of residual carcinoma in the oropharynx or hypopharynx.  No hypermetabolic cervical lymph nodes.  Incidentally noted was new rounded intense radiotracer activity in  the lateral right abdomen adjacent to bowel and liver, favoring abscess formation related to recent bowel surgery.  He had recent ENT evaluation with Dr. [name] at [facility] on 05/23/2024.  Flexible laryngoscopy showed no evidence of disease.  Restaging CT neck and CT chest on 10/29/2024 showed no evidence of disease recurrence.  He did have persistent right IJ thrombus on CT of the neck.  Clinically no symptoms or signs suggestive of disease recurrence.  Will continue surveillance as per NCCN guidelines for his tonsillar cancer.  He is to continue follow-up with radiation oncology and ENT departments

## 2024-11-14 ENCOUNTER — Ambulatory Visit: Payer: Self-pay

## 2024-11-14 DIAGNOSIS — L598 Other specified disorders of the skin and subcutaneous tissue related to radiation: Secondary | ICD-10-CM

## 2024-11-14 DIAGNOSIS — C090 Malignant neoplasm of tonsillar fossa: Secondary | ICD-10-CM

## 2024-11-14 DIAGNOSIS — R293 Abnormal posture: Secondary | ICD-10-CM

## 2024-11-14 DIAGNOSIS — M436 Torticollis: Secondary | ICD-10-CM
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
## 2024-11-19 ENCOUNTER — Other Ambulatory Visit (HOSPITAL_COMMUNITY): Payer: Self-pay

## 2024-11-21 ENCOUNTER — Ambulatory Visit: Payer: Self-pay

## 2024-11-21 DIAGNOSIS — R1310 Dysphagia, unspecified: Secondary | ICD-10-CM

## 2024-11-21 DIAGNOSIS — M436 Torticollis: Secondary | ICD-10-CM

## 2024-11-21 DIAGNOSIS — C090 Malignant neoplasm of tonsillar fossa: Secondary | ICD-10-CM

## 2024-11-21 DIAGNOSIS — Y842 Radiological procedure and radiotherapy as the cause of abnormal reaction of the patient, or of later complication, without mention of misadventure at the time of the procedure: Secondary | ICD-10-CM

## 2024-11-21 DIAGNOSIS — R293 Abnormal posture: Secondary | ICD-10-CM
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
## 2024-11-28 ENCOUNTER — Ambulatory Visit: Payer: Self-pay

## 2024-11-28 DIAGNOSIS — R293 Abnormal posture: Secondary | ICD-10-CM

## 2024-11-28 DIAGNOSIS — Y842 Radiological procedure and radiotherapy as the cause of abnormal reaction of the patient, or of later complication, without mention of misadventure at the time of the procedure: Secondary | ICD-10-CM

## 2024-11-28 DIAGNOSIS — M436 Torticollis: Secondary | ICD-10-CM

## 2024-11-28 DIAGNOSIS — C090 Malignant neoplasm of tonsillar fossa: Secondary | ICD-10-CM
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
## 2024-12-05 ENCOUNTER — Encounter: Payer: Self-pay | Admitting: Oncology

## 2024-12-05 ENCOUNTER — Ambulatory Visit: Payer: Self-pay | Attending: Radiology

## 2024-12-05 DIAGNOSIS — M436 Torticollis: Secondary | ICD-10-CM | POA: Insufficient documentation

## 2024-12-05 DIAGNOSIS — Y842 Radiological procedure and radiotherapy as the cause of abnormal reaction of the patient, or of later complication, without mention of misadventure at the time of the procedure: Secondary | ICD-10-CM | POA: Insufficient documentation

## 2024-12-05 DIAGNOSIS — C090 Malignant neoplasm of tonsillar fossa: Secondary | ICD-10-CM | POA: Diagnosis present

## 2024-12-05 DIAGNOSIS — R293 Abnormal posture: Secondary | ICD-10-CM | POA: Diagnosis present

## 2024-12-05 DIAGNOSIS — L598 Other specified disorders of the skin and subcutaneous tissue related to radiation: Secondary | ICD-10-CM | POA: Diagnosis not present
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
## 2024-12-12 ENCOUNTER — Ambulatory Visit: Payer: Self-pay

## 2024-12-12 DIAGNOSIS — C090 Malignant neoplasm of tonsillar fossa: Secondary | ICD-10-CM

## 2024-12-12 DIAGNOSIS — M436 Torticollis: Secondary | ICD-10-CM

## 2024-12-12 DIAGNOSIS — L598 Other specified disorders of the skin and subcutaneous tissue related to radiation: Secondary | ICD-10-CM

## 2024-12-12 DIAGNOSIS — R293 Abnormal posture: Secondary | ICD-10-CM
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
## 2024-12-19 ENCOUNTER — Ambulatory Visit: Payer: Self-pay

## 2024-12-19 DIAGNOSIS — Y842 Radiological procedure and radiotherapy as the cause of abnormal reaction of the patient, or of later complication, without mention of misadventure at the time of the procedure: Secondary | ICD-10-CM

## 2024-12-19 DIAGNOSIS — R293 Abnormal posture: Secondary | ICD-10-CM

## 2024-12-19 DIAGNOSIS — C090 Malignant neoplasm of tonsillar fossa: Secondary | ICD-10-CM

## 2024-12-19 DIAGNOSIS — M436 Torticollis: Secondary | ICD-10-CM
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
## 2025-02-07 ENCOUNTER — Inpatient Hospital Stay: Payer: Self-pay

## 2025-02-07 ENCOUNTER — Inpatient Hospital Stay: Payer: Self-pay | Admitting: Oncology

## 2025-06-24 ENCOUNTER — Encounter: Admitting: Family Medicine

## 2025-10-15 ENCOUNTER — Ambulatory Visit: Payer: Self-pay | Admitting: Radiology
